# Patient Record
Sex: Female | Born: 1940
Health system: Southern US, Community
[De-identification: ages and names within clinical notes are randomized; demographics above are authoritative.]

## PROBLEM LIST (undated history)

## (undated) DIAGNOSIS — I10 Essential (primary) hypertension: Secondary | ICD-10-CM

## (undated) DIAGNOSIS — I739 Peripheral vascular disease, unspecified: Secondary | ICD-10-CM

## (undated) DIAGNOSIS — J45909 Unspecified asthma, uncomplicated: Secondary | ICD-10-CM

## (undated) DIAGNOSIS — E785 Hyperlipidemia, unspecified: Secondary | ICD-10-CM

## (undated) DIAGNOSIS — B019 Varicella without complication: Secondary | ICD-10-CM

## (undated) DIAGNOSIS — R0600 Dyspnea, unspecified: Secondary | ICD-10-CM

## (undated) DIAGNOSIS — M1990 Unspecified osteoarthritis, unspecified site: Secondary | ICD-10-CM

## (undated) HISTORY — DX: Varicella without complication: B01.9

## (undated) HISTORY — DX: Unspecified osteoarthritis, unspecified site: M19.90

## (undated) HISTORY — DX: Essential (primary) hypertension: I10

## (undated) HISTORY — DX: Hyperlipidemia, unspecified: E78.5

## (undated) HISTORY — DX: Unspecified asthma, uncomplicated: J45.909

## (undated) HISTORY — PX: BREAST EXCISIONAL BIOPSY: SUR124

---
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
## 1971-02-05 HISTORY — PX: FOOT SURGERY: SHX648

## 2005-09-17 LAB — HM COLONOSCOPY

## 2007-06-18 LAB — HM DEXA SCAN

## 2009-08-29 IMAGING — [facility] DIGITAL BILATERAL SCREENING WITH CAD
1 series · 4 of 4 positions shown · non-contrast
Comparison: none

REASON FOR EXAM: pain

[R CC · right · 4 of 4 slices shown]
[im 1/4]
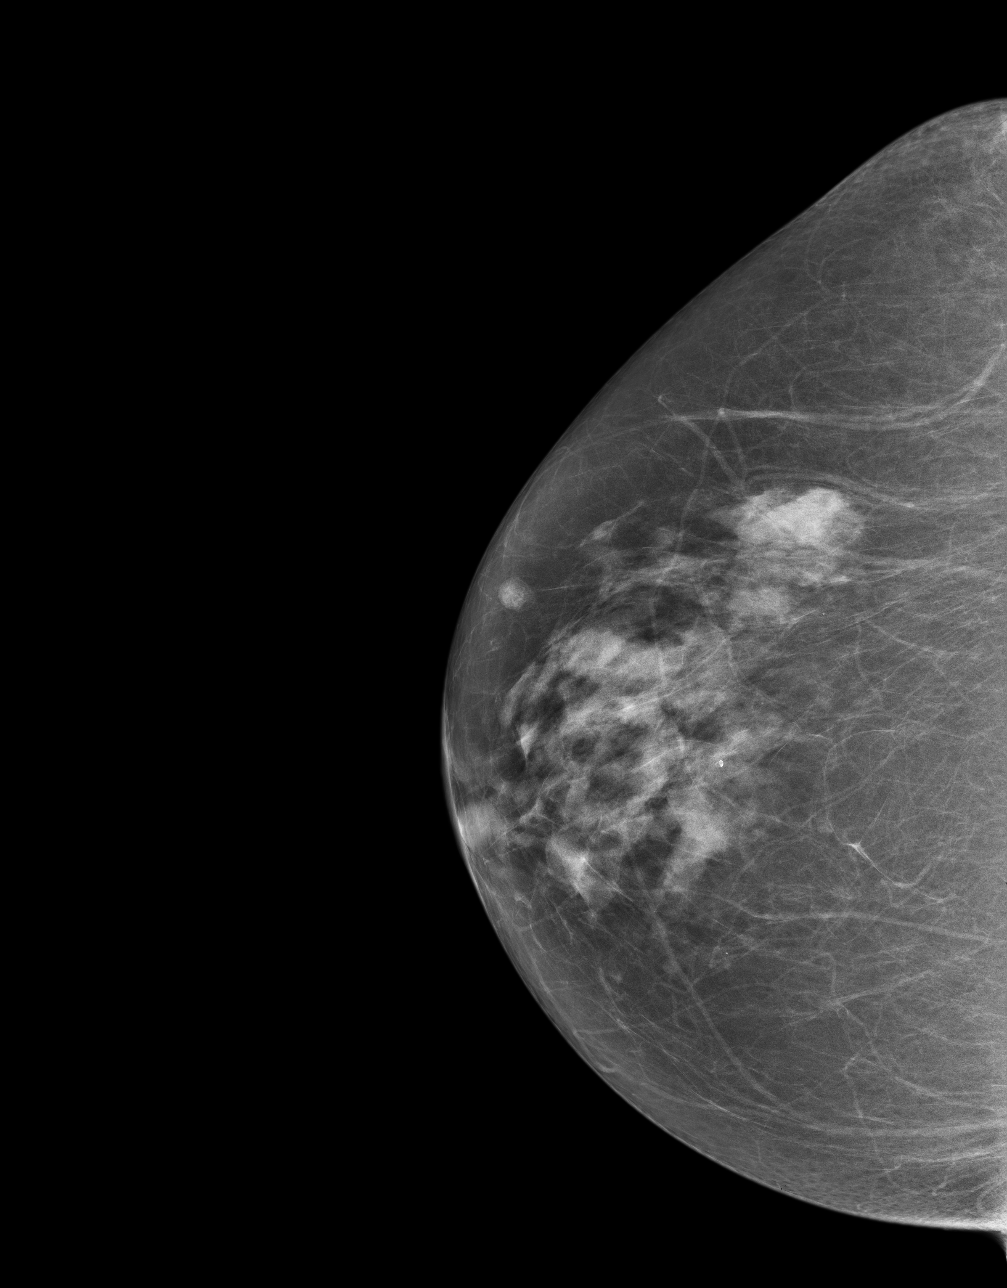
[im 2/4]
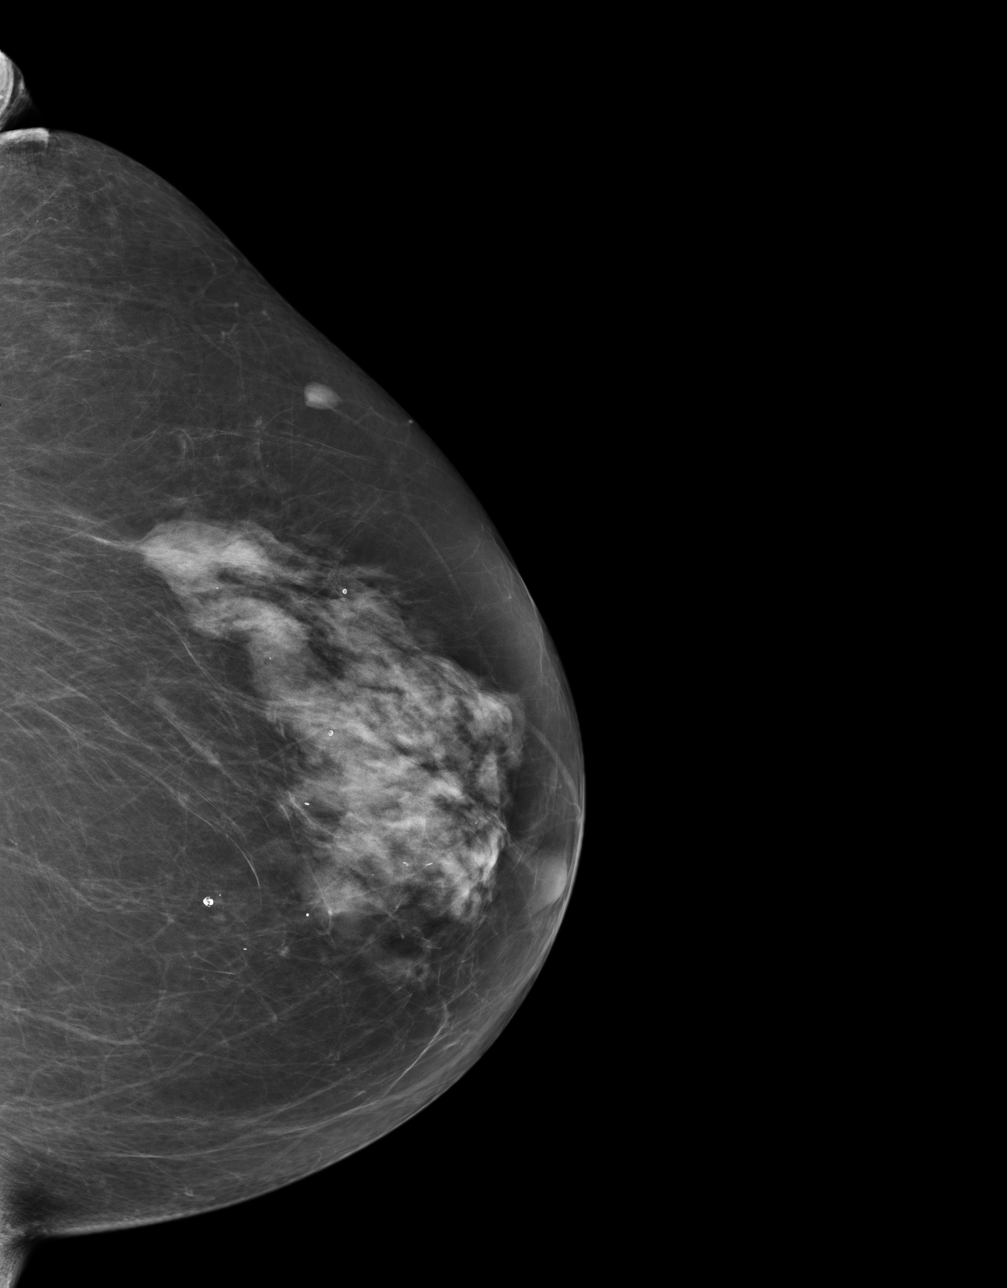
[im 3/4]
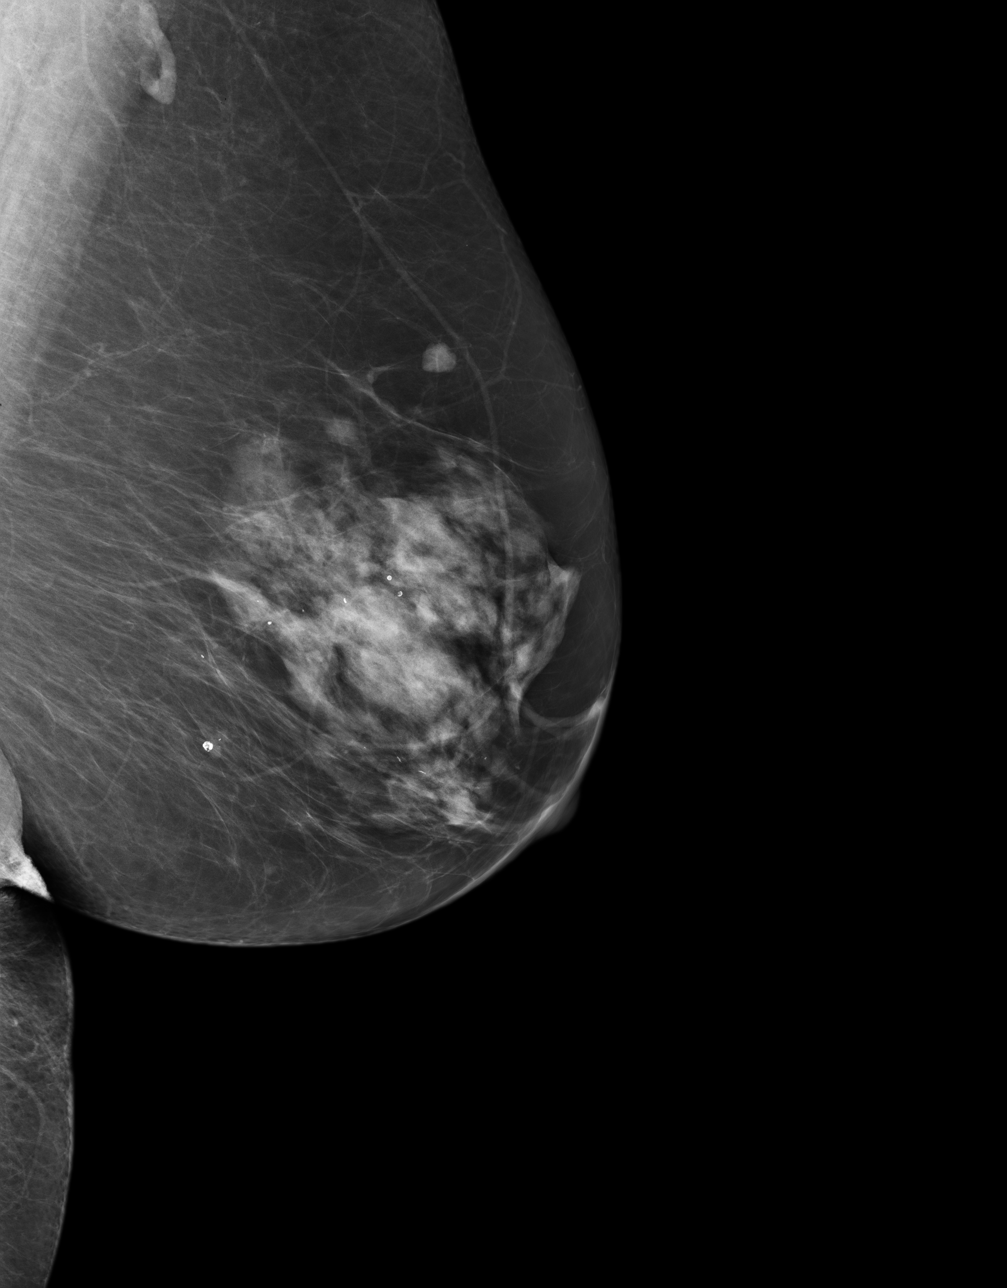
[im 4/4]
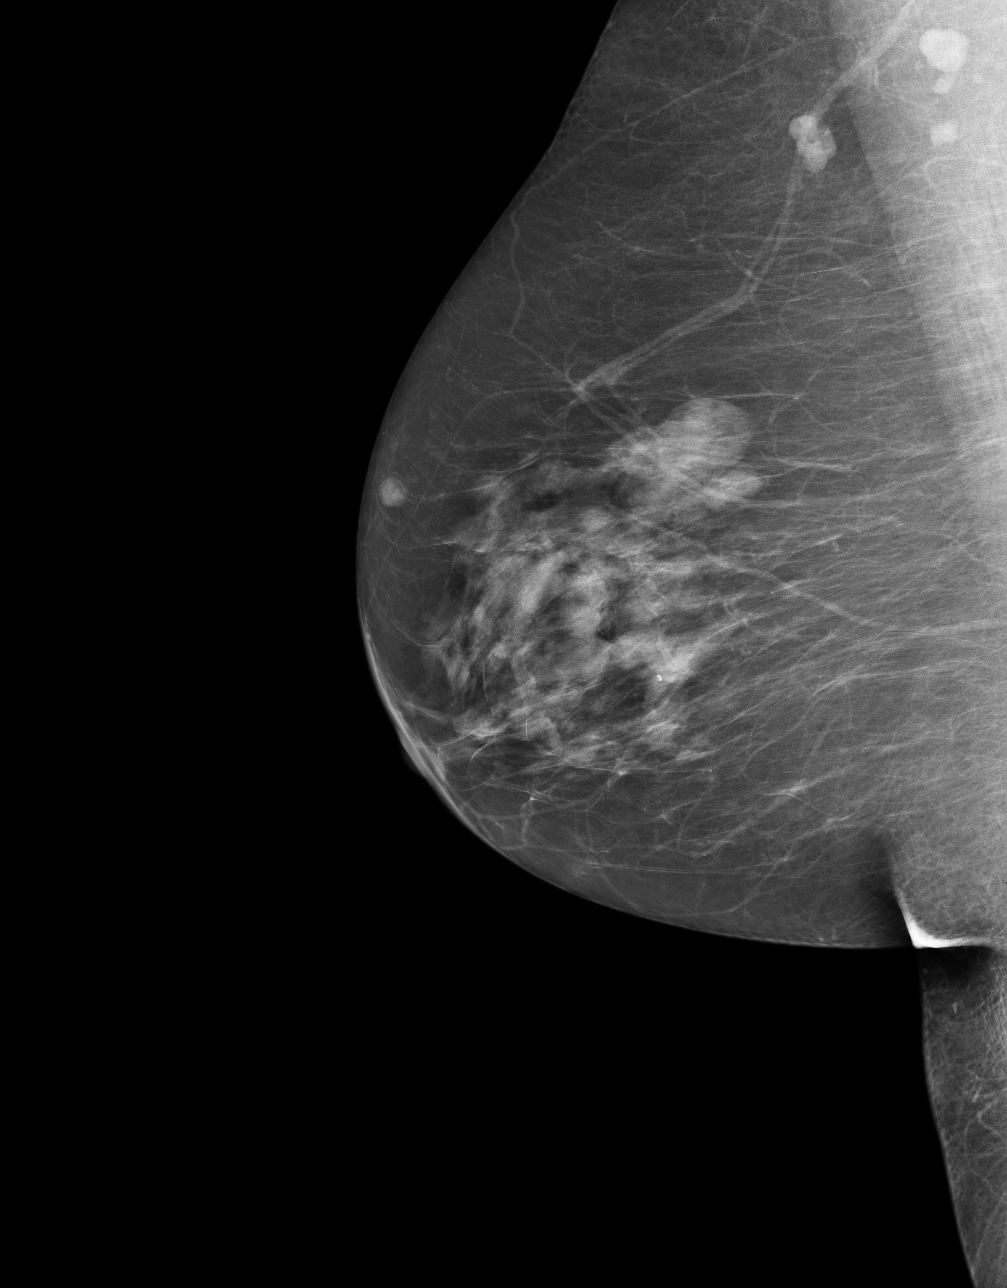

[4 of 4 positions shown; findings below may reference images not displayed]

Procedure: ABDOMINAL ULTRASOUND
The liver and spleen are normal in appearance. There is observed an echo
density in the gallbladder suspicious for a gallstone. No definite shadowing
is seen but the density does appear to move as the patient changes position.
 In the absence of shadowing, the confidence level in diagnosing gallstones
sonographically is diminished. An oral cholecystogram might be helpful for
further evaluation if such is clinically desired. No thickening of the
gallbladder wall is seen. The common bile duct measures 4.7 mm in diameter
which is within normal limits. The kidneys showed no hydronephrosis. There
is no ascites.
CONCLUSION: 1. There is a nonshadowing echo density in the gallbladder suspicious for a
nonshadowing stone. In the absence of shadowing, however, the confidence
level in diagnosing gallstones sonographically is diminished. In this
patient, further evaluation by oral cholecystogram might be helpful if such
is clinically indicated.

## 2010-10-10 IMAGING — [facility] DIGITAL BILATERAL SCREENING WITH CAD
1 series · 4 of 4 positions shown · non-contrast
Comparison: none

REASON FOR EXAM: Reason for Test: fluid build up;

[Series 7515: R CC · right · 4 of 4 slices shown]
[im 1/4]
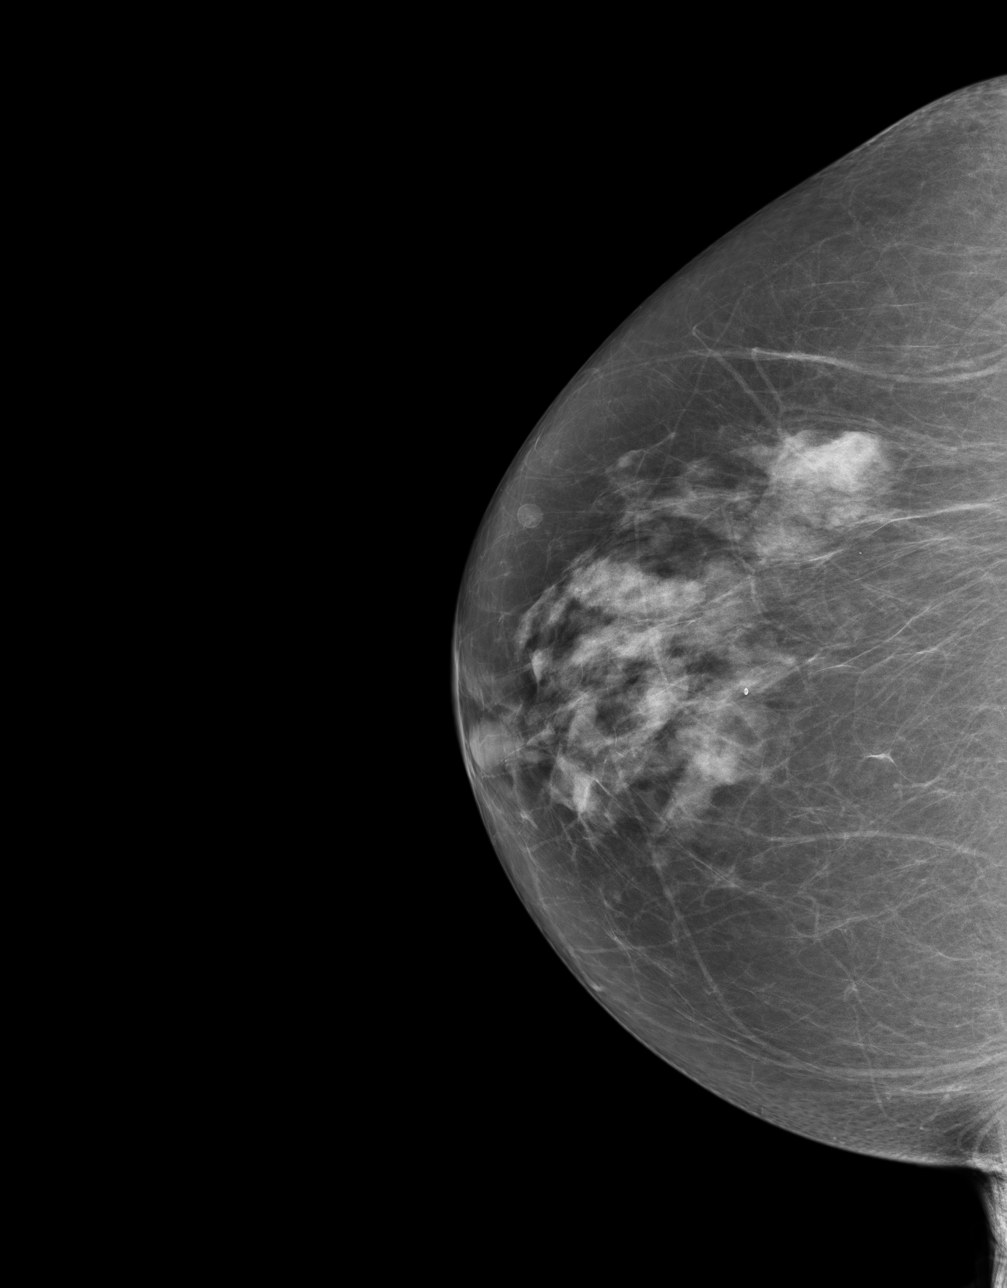
[im 2/4]
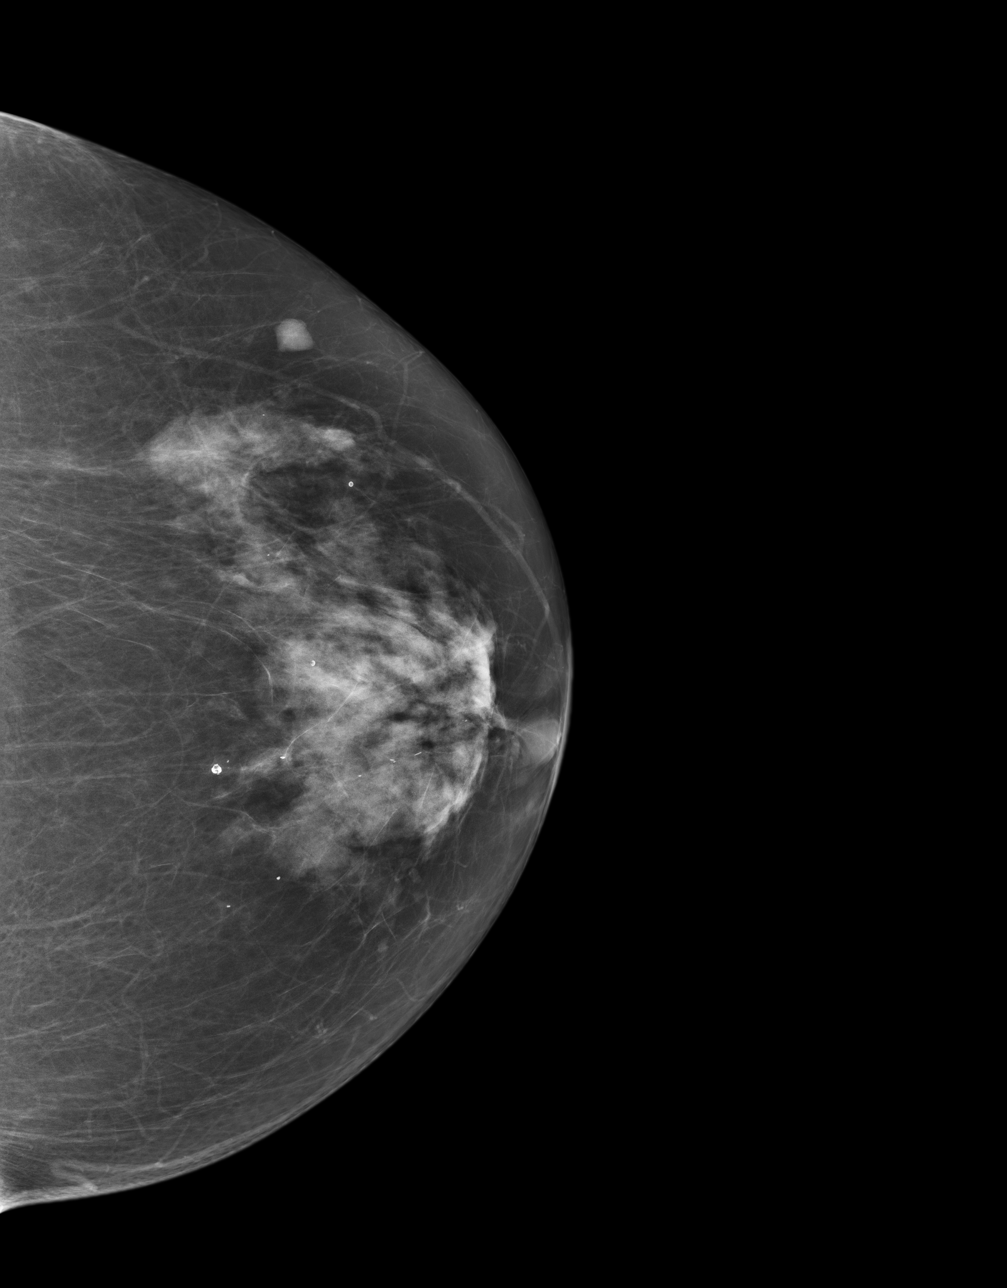
[im 3/4]
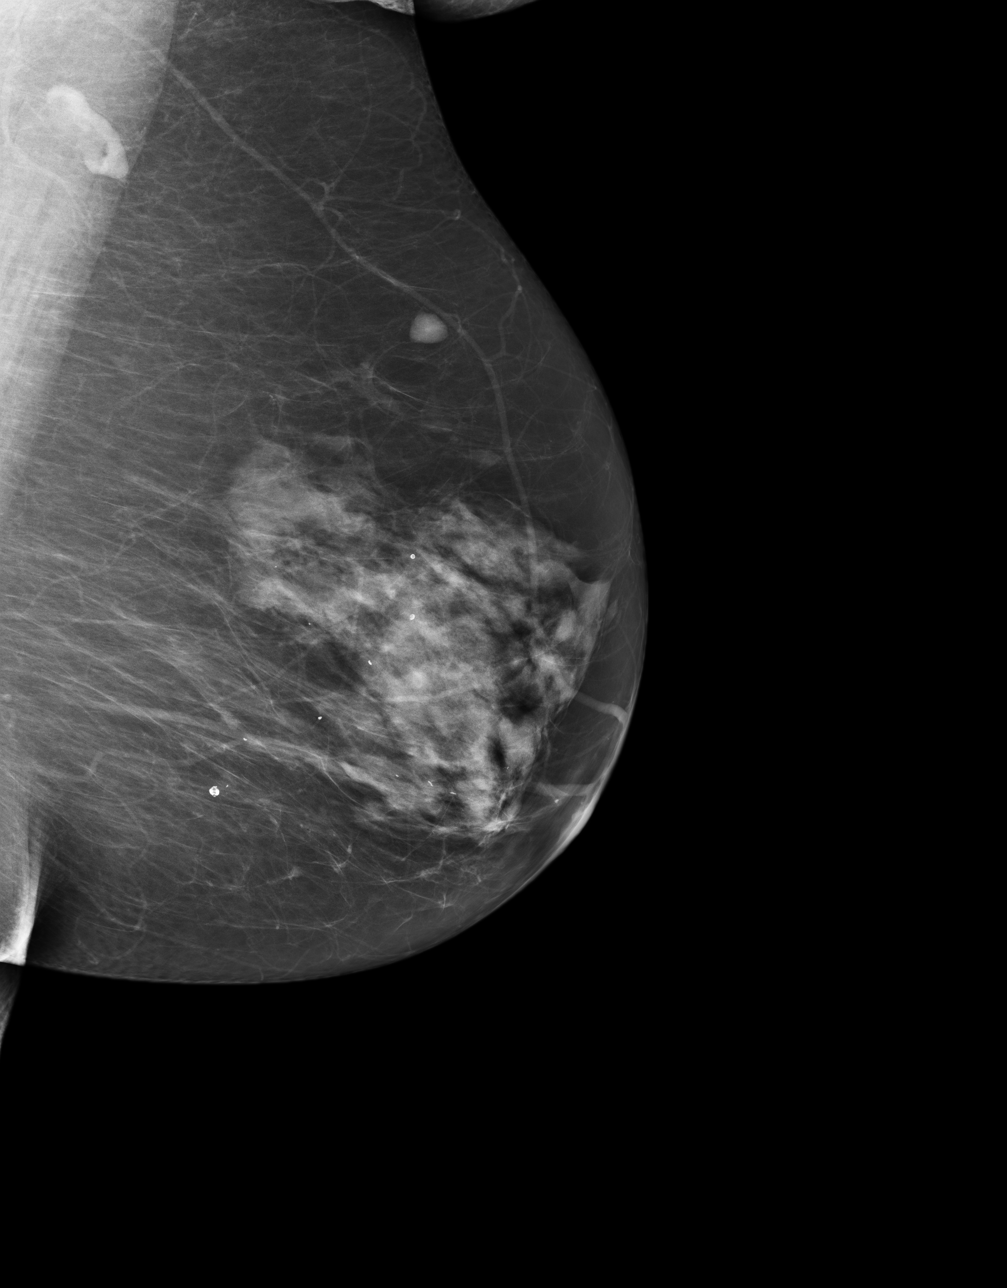
[im 4/4]
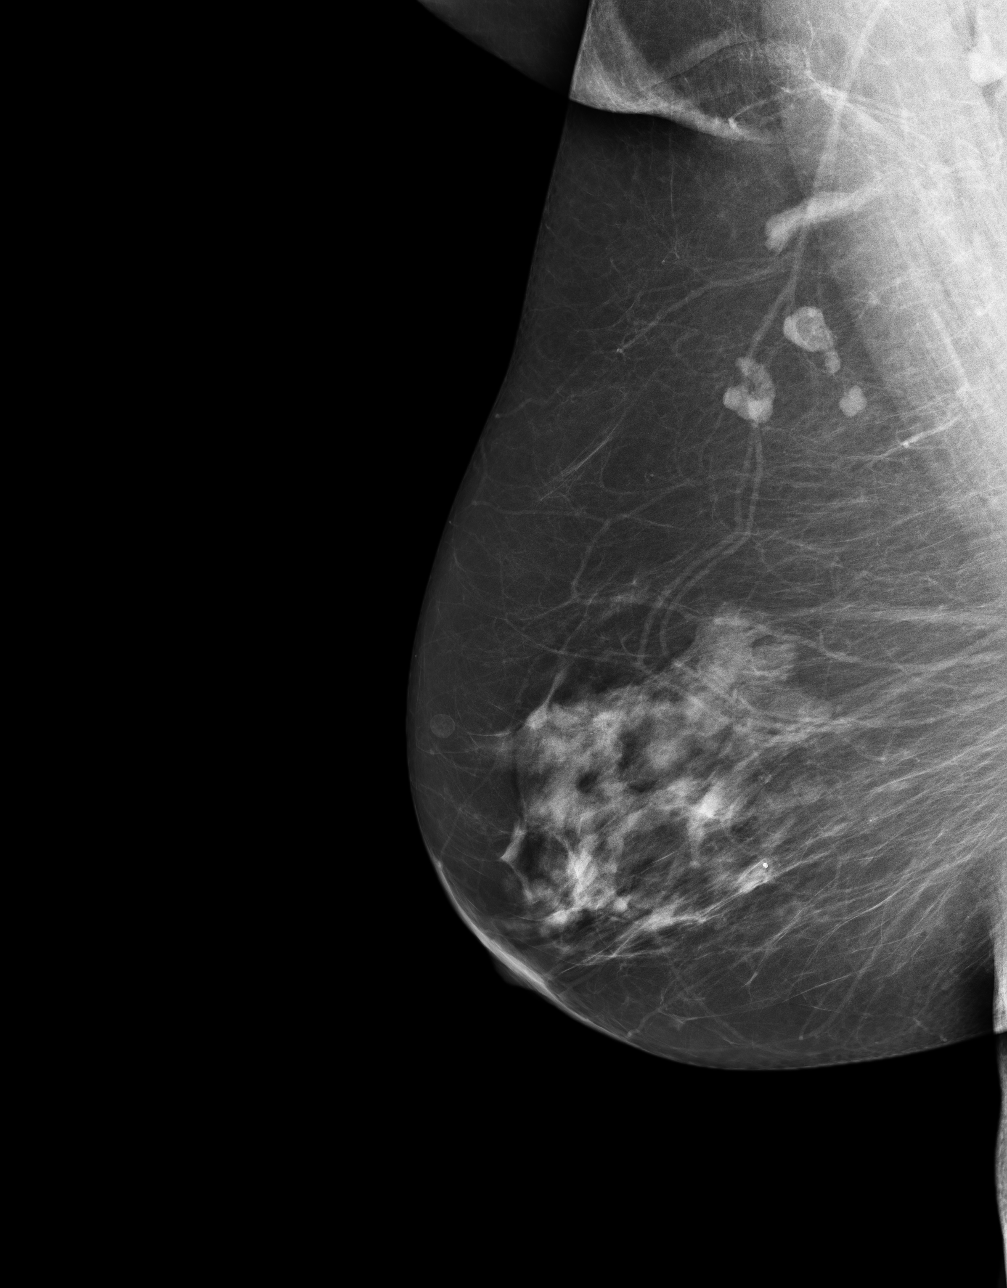

[4 of 4 positions shown; findings below may reference images not displayed]

Procedure: TWO VIEWS OF THE CHEST:
 Comparison is made to a prior study dated [DATE].
 There is increased AP diameter of the chest. Thickening of the interstitial
markings is also appreciated without evidence of overt edema. The cardiac
silhouette is unremarkable. No focal regions of consolidation are
demonstrated. There is a calcified plaque along the RIGHT hemidiaphragm. The
visualized bony skeleton demonstrates no evidence of fracture or
dislocation.
IMPRESSION: 1) Findings to suggest possible chronic obstructive pulmonary disease. The
thickened interstitial markings may represent the sequela of fibrosis.
 2) The calcified plaque along the RIGHT hemidiaphragm raises the suspicion
of prior asbestos exposure.

 <pp>

## 2011-11-05 IMAGING — [facility] DIGITAL BILATERAL SCREENING WITH CAD
1 series · 4 of 4 positions shown · non-contrast
Comparison: none

REASON FOR EXAM: abn cxr

[R CC · right · 4 of 4 slices shown]
[im 1/4]
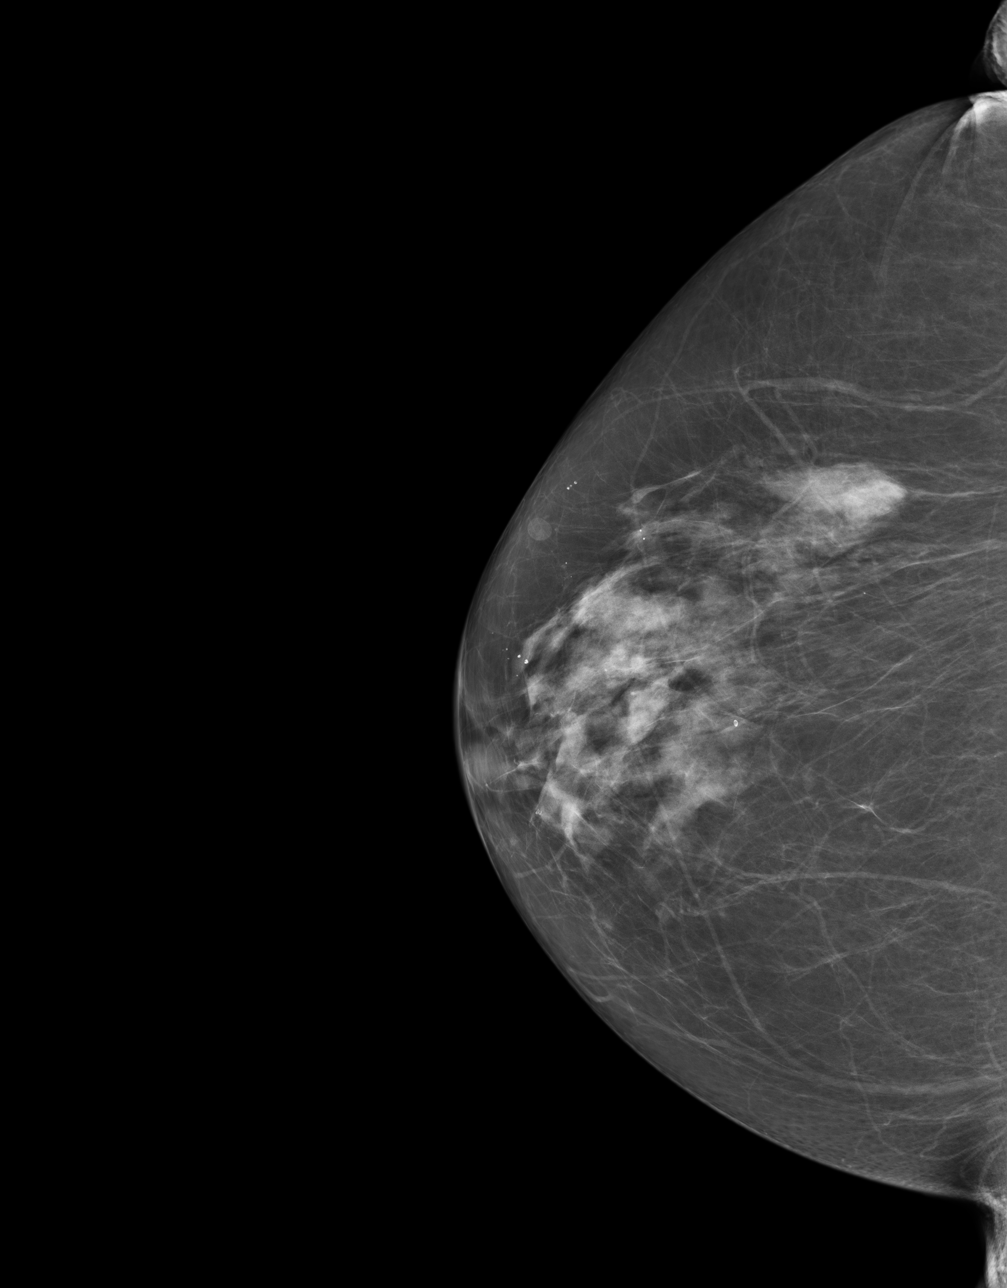
[im 2/4]
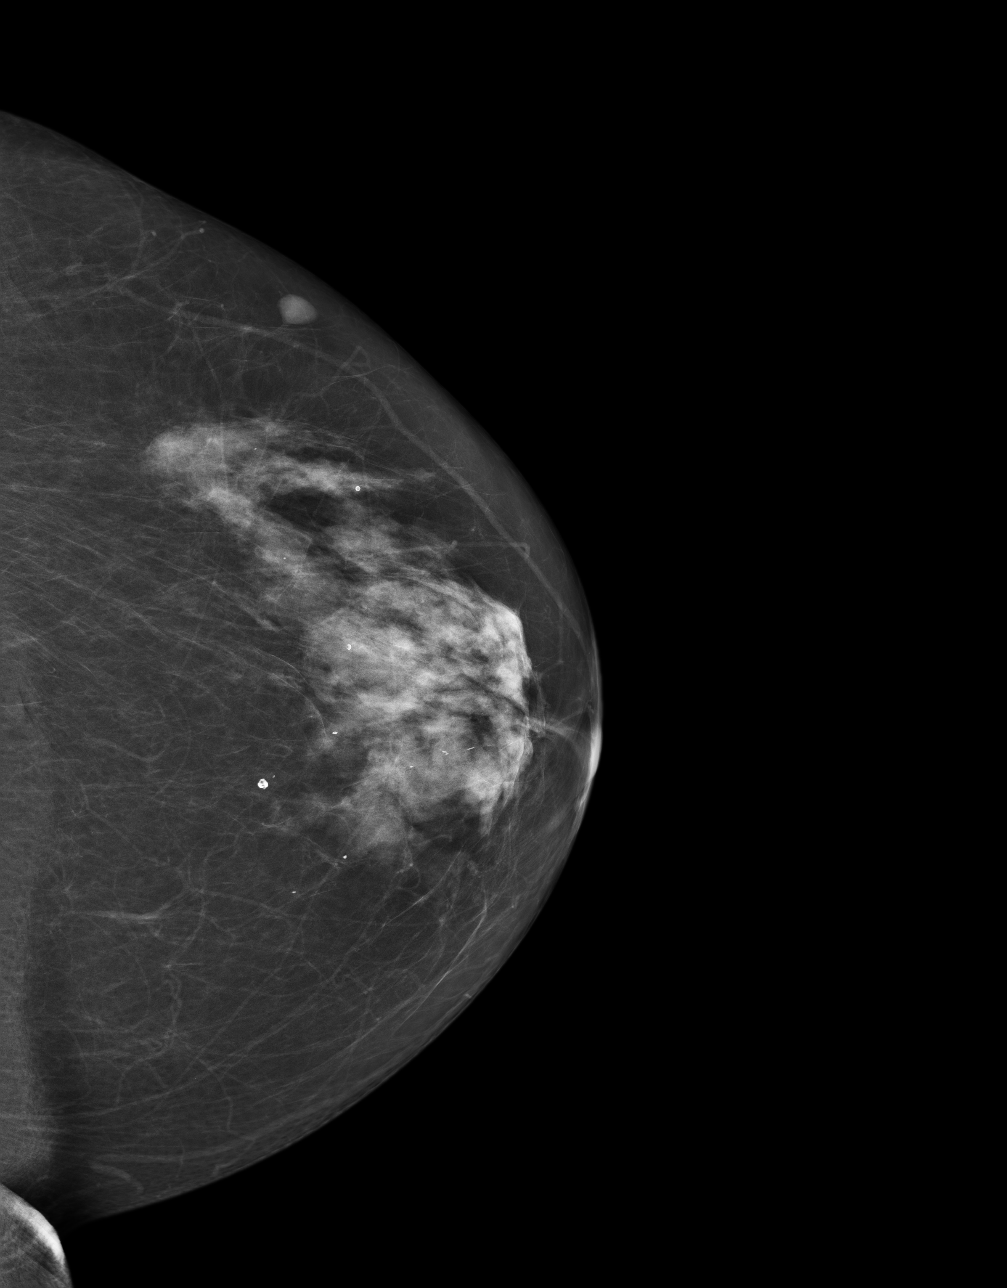
[im 3/4]
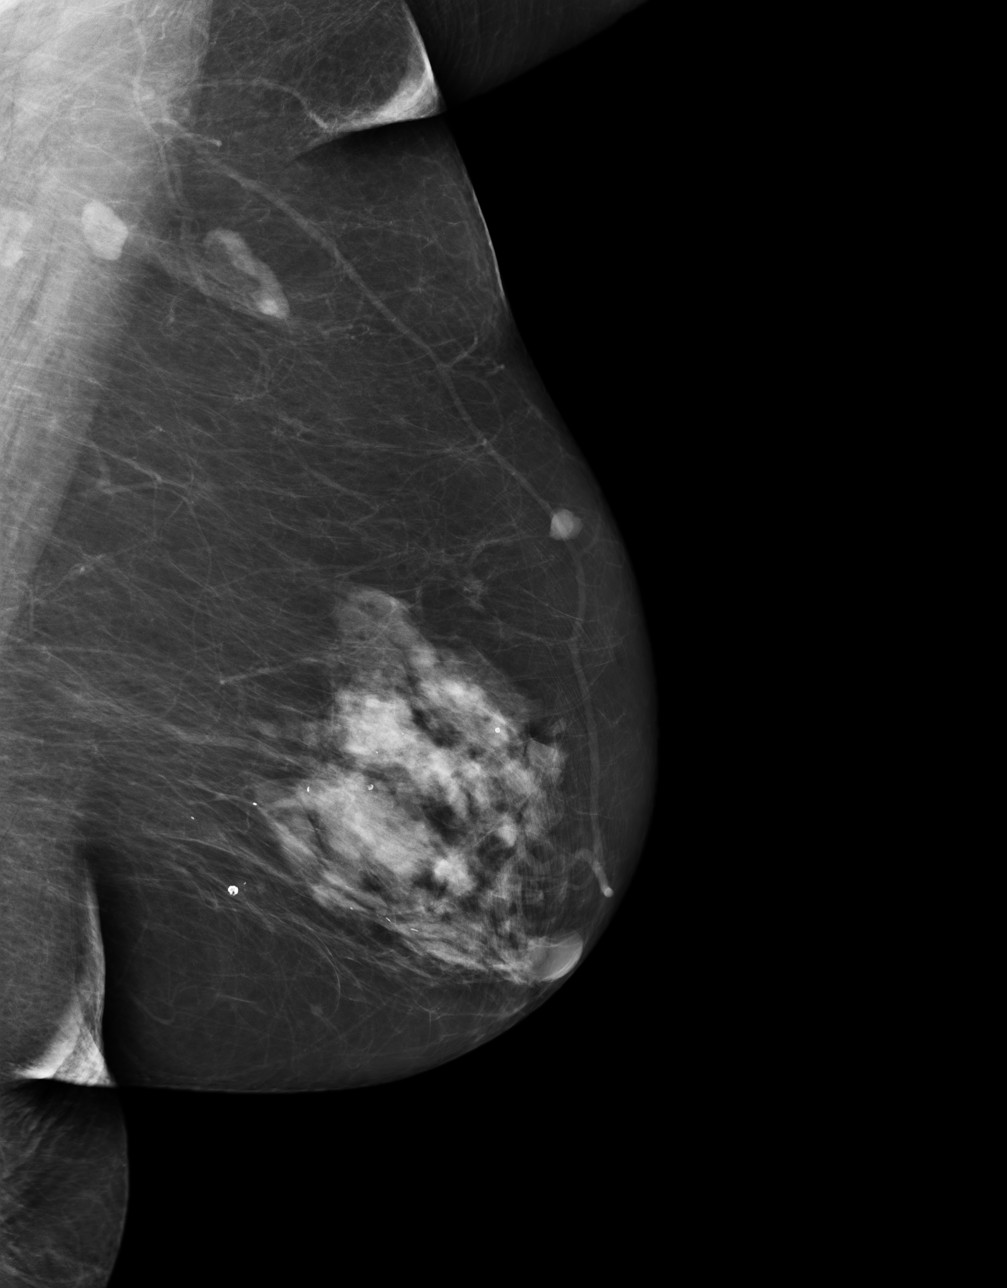
[im 4/4]
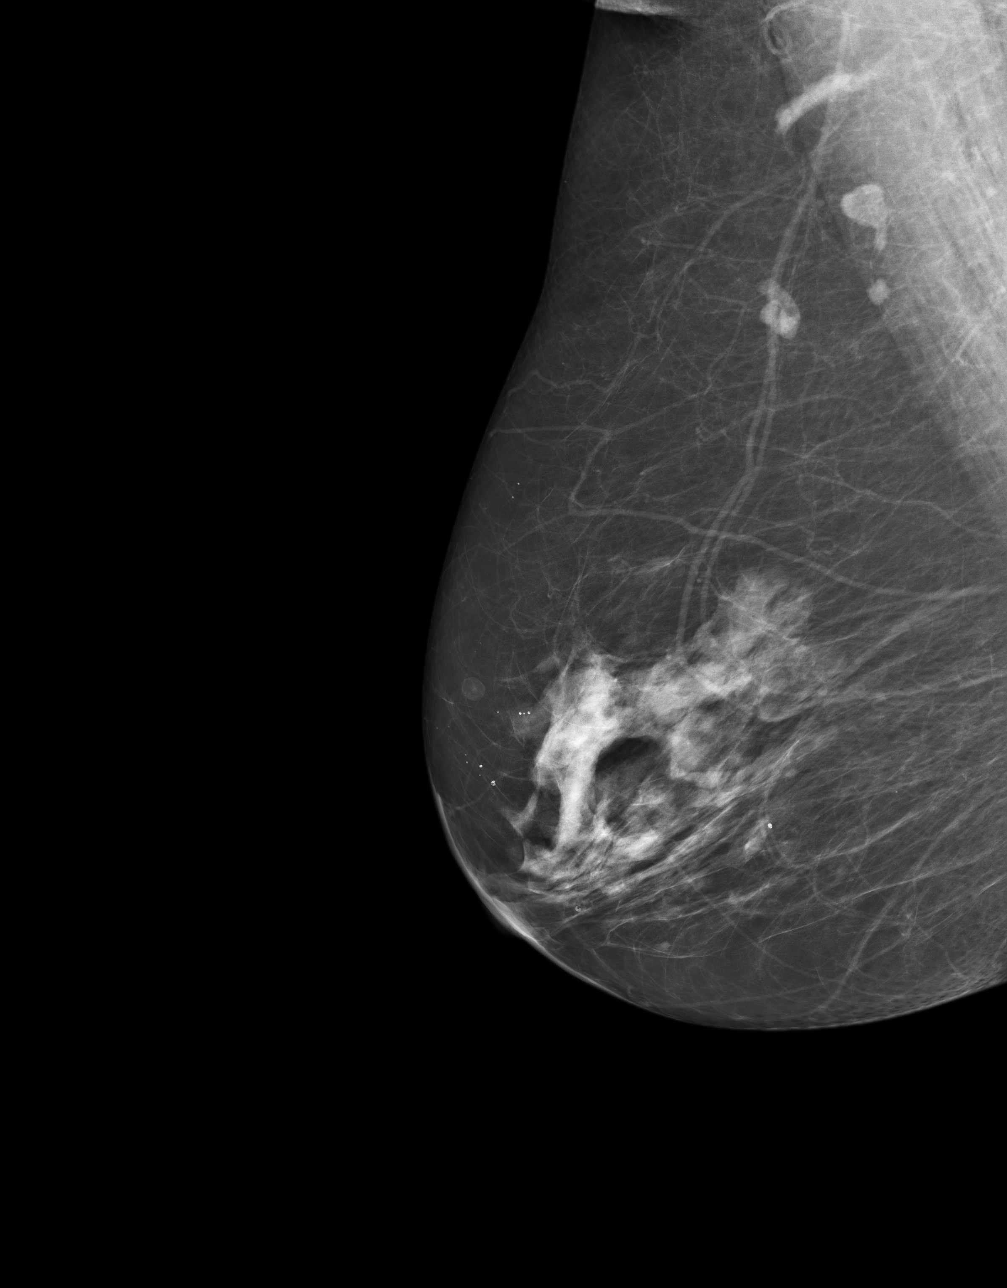

[4 of 4 positions shown; findings below may reference images not displayed]

FINDINGS: There is atelectasis in the RIGHT upper lobe with an area of
atelectatic lung appearing to be present along the superior mediastinum to
the RIGHT. There is evidence of volume loss with elevation of the major
fissure on the RIGHT compared to the LEFT. The lungs show slightly atypical
appearance. Does the patient have a history of chronic obstructive disease
such as asthma? The changes do not appear to represent cystic fibrosis.
There is a little bit of atelectasis or fibrosis at the lung base on the
LEFT. There is no adenopathy evident. No mediastinal mass is demonstrated.
The heart appears normal. There is no pleural effusion. The upper abdominal
viscera appear to be grossly normal.
IMPRESSION: 1) Atelectasis of the RIGHT upper lobe. Correlation for history of
underlying lung disease would be recommended.

## 2012-02-05 HISTORY — PX: COLONOSCOPY: SHX174

## 2013-03-03 DIAGNOSIS — R030 Elevated blood-pressure reading, without diagnosis of hypertension: Secondary | ICD-10-CM | POA: Diagnosis not present

## 2013-03-03 DIAGNOSIS — H9209 Otalgia, unspecified ear: Secondary | ICD-10-CM | POA: Diagnosis not present

## 2013-03-03 DIAGNOSIS — H6120 Impacted cerumen, unspecified ear: Secondary | ICD-10-CM | POA: Diagnosis not present

## 2013-03-10 DIAGNOSIS — Z5181 Encounter for therapeutic drug level monitoring: Secondary | ICD-10-CM | POA: Diagnosis not present

## 2013-03-10 DIAGNOSIS — I10 Essential (primary) hypertension: Secondary | ICD-10-CM | POA: Diagnosis not present

## 2013-04-14 DIAGNOSIS — Z5181 Encounter for therapeutic drug level monitoring: Secondary | ICD-10-CM | POA: Diagnosis not present

## 2013-04-14 DIAGNOSIS — I10 Essential (primary) hypertension: Secondary | ICD-10-CM | POA: Diagnosis not present

## 2013-04-14 DIAGNOSIS — E871 Hypo-osmolality and hyponatremia: Secondary | ICD-10-CM | POA: Diagnosis not present

## 2013-11-11 DIAGNOSIS — Z23 Encounter for immunization: Secondary | ICD-10-CM | POA: Diagnosis not present

## 2013-11-17 DIAGNOSIS — H25813 Combined forms of age-related cataract, bilateral: Secondary | ICD-10-CM | POA: Diagnosis not present

## 2014-04-18 ENCOUNTER — Ambulatory Visit (INDEPENDENT_AMBULATORY_CARE_PROVIDER_SITE_OTHER): Payer: Medicare Other

## 2014-04-18 ENCOUNTER — Encounter: Payer: Self-pay | Admitting: Podiatry

## 2014-04-18 ENCOUNTER — Ambulatory Visit (INDEPENDENT_AMBULATORY_CARE_PROVIDER_SITE_OTHER): Payer: Medicare Other | Admitting: Podiatry

## 2014-04-18 VITALS — BP 119/62 | HR 67 | Resp 16

## 2014-04-18 DIAGNOSIS — L84 Corns and callosities: Secondary | ICD-10-CM | POA: Diagnosis not present

## 2014-04-18 DIAGNOSIS — M779 Enthesopathy, unspecified: Secondary | ICD-10-CM

## 2014-04-18 DIAGNOSIS — M898X9 Other specified disorders of bone, unspecified site: Secondary | ICD-10-CM

## 2014-04-18 DIAGNOSIS — M7752 Other enthesopathy of left foot: Secondary | ICD-10-CM

## 2014-04-18 DIAGNOSIS — M778 Other enthesopathies, not elsewhere classified: Secondary | ICD-10-CM

## 2014-04-18 NOTE — Progress Notes (Signed)
   Subjective:    Patient ID: Lacey Brown, female    DOB: [birth year], 74 y.o.   MRN: [id number]  HPI Comments: "I have a corn"  Patient c/o aching 5th toe left interdigitally for several years. The area is callused. Its only bothersome through cold months when she has to wear enclosed shoes.   Toe Pain       Review of Systems  All other systems reviewed and are negative.      Objective:   Physical Exam: I have reviewed her past medical history medications allergies surgery social history and review of systems. Pulses are strongly palpable bilateral. Neurologic sensorium is intact as was the monofilament. Deep tendon reflexes are intact bilateral. She has pain on palpation fifth metatarsophalangeal joint of the left foot she has a soft corn between fourth and fifth digits of the left foot.        Assessment & Plan:  Assessment: Heloma molle left foot with porokeratotic lesion and capsulitis.  Plan: Injected fifth metatarsophalangeal joint periarticular today with dexamethasone and local anesthetic. Also debrided reactive hyperkeratotic lesion from between the toes. I will follow up with her on an as-needed basis.

## 2014-06-08 DIAGNOSIS — I10 Essential (primary) hypertension: Secondary | ICD-10-CM | POA: Diagnosis not present

## 2014-06-08 DIAGNOSIS — Z0000 Encounter for general adult medical examination without abnormal findings: Secondary | ICD-10-CM | POA: Diagnosis not present

## 2014-06-08 DIAGNOSIS — Z23 Encounter for immunization: Secondary | ICD-10-CM | POA: Diagnosis not present

## 2014-06-08 DIAGNOSIS — J449 Chronic obstructive pulmonary disease, unspecified: Secondary | ICD-10-CM | POA: Diagnosis not present

## 2014-06-08 DIAGNOSIS — Z5181 Encounter for therapeutic drug level monitoring: Secondary | ICD-10-CM | POA: Diagnosis not present

## 2014-06-08 DIAGNOSIS — Z1231 Encounter for screening mammogram for malignant neoplasm of breast: Secondary | ICD-10-CM | POA: Diagnosis not present

## 2014-06-08 LAB — BASIC METABOLIC PANEL
Creatinine: 0.8 mg/dL (ref <=1.1)
Potassium: 4.1 mmol/L (ref 3.4–5.3)

## 2014-06-08 LAB — CBC AND DIFFERENTIAL
HEMATOCRIT: 36 % (ref 36–46)
HEMOGLOBIN: 12 g/dL (ref 12.0–16.0)

## 2014-06-30 DIAGNOSIS — Z1231 Encounter for screening mammogram for malignant neoplasm of breast: Secondary | ICD-10-CM | POA: Diagnosis not present

## 2014-06-30 LAB — HM MAMMOGRAPHY: HM MAMMO: NORMAL

## 2014-08-18 DIAGNOSIS — Z5181 Encounter for therapeutic drug level monitoring: Secondary | ICD-10-CM | POA: Diagnosis not present

## 2014-08-18 DIAGNOSIS — Z0130 Encounter for examination of blood pressure without abnormal findings: Secondary | ICD-10-CM | POA: Diagnosis not present

## 2014-08-18 DIAGNOSIS — E871 Hypo-osmolality and hyponatremia: Secondary | ICD-10-CM | POA: Diagnosis not present

## 2014-09-28 DIAGNOSIS — M19011 Primary osteoarthritis, right shoulder: Secondary | ICD-10-CM | POA: Diagnosis not present

## 2014-10-03 ENCOUNTER — Ambulatory Visit (INDEPENDENT_AMBULATORY_CARE_PROVIDER_SITE_OTHER): Payer: Medicare Other

## 2014-10-03 ENCOUNTER — Encounter: Payer: Self-pay | Admitting: Podiatry

## 2014-10-03 ENCOUNTER — Ambulatory Visit (INDEPENDENT_AMBULATORY_CARE_PROVIDER_SITE_OTHER): Payer: Medicare Other | Admitting: Podiatry

## 2014-10-03 VITALS — BP 149/84 | HR 69 | Resp 12

## 2014-10-03 DIAGNOSIS — M779 Enthesopathy, unspecified: Secondary | ICD-10-CM | POA: Diagnosis not present

## 2014-10-03 NOTE — Progress Notes (Signed)
   Subjective:    Patient ID: Lacey Brown, female    DOB: 10/30/1940, 74 y.o.   MRN: [id number]  HPI: She presents today with several week duration of pain to the second metatarsophalangeal joint of the right foot. She denies any trauma but states that it hurts to walk on his stand on it for long periods of time. She has tried different shoes with all to no avail.    Review of Systems  Cardiovascular: Positive for leg swelling.  Hematological: Bruises/bleeds easily.       Objective:   Physical Exam: I have reviewed her past history medications allergies surgeries and social history. 74 year old white female vital signs stable alert and oriented 3 no acute distress. Pulses are strongly palpable bilateral. Neurologic sensorium is intact per Semmes-Weinstein monofilament. The tendon reflexes are intact bilateral and muscle strength +5 over 5 dorsiflexion plantar flexors and inverters everters all intrinsic musculature is intact. Orthopedic evaluation of his result joints distal to the ankle for range of motion without crepitation. She has pain on end range of motion of the second metatarsophalangeal joint of the right foot. Radiographs confirm elongated second metatarsal with hallux valgus deformity right. This is consistent with capsulitis. Cutaneous evaluation and straight supple well-hydrated cutis no erythema edema cellulitis drainage or odor no open wounds.        Assessment & Plan:  Assessment: Capsulitis second metatarsophalangeal joint of the right foot.  Plan: Discussed etiology pathology conservative versus surgical therapies. After sterile Betadine skin prep injected the second metatarsophalangeal joint with local and aesthetic. I will follow-up with her in 1 month.  Roselind Messier DPM

## 2014-10-24 ENCOUNTER — Encounter: Payer: Self-pay | Admitting: Internal Medicine

## 2014-10-24 ENCOUNTER — Ambulatory Visit (INDEPENDENT_AMBULATORY_CARE_PROVIDER_SITE_OTHER): Payer: Medicare Other | Admitting: Internal Medicine

## 2014-10-24 VITALS — BP 148/82 | HR 58 | Temp 98.3°F | Ht 59.5 in | Wt 170.0 lb

## 2014-10-24 DIAGNOSIS — M1990 Unspecified osteoarthritis, unspecified site: Secondary | ICD-10-CM | POA: Diagnosis not present

## 2014-10-24 DIAGNOSIS — E669 Obesity, unspecified: Secondary | ICD-10-CM

## 2014-10-24 DIAGNOSIS — J45909 Unspecified asthma, uncomplicated: Secondary | ICD-10-CM | POA: Insufficient documentation

## 2014-10-24 DIAGNOSIS — J449 Chronic obstructive pulmonary disease, unspecified: Secondary | ICD-10-CM | POA: Insufficient documentation

## 2014-10-24 DIAGNOSIS — E785 Hyperlipidemia, unspecified: Secondary | ICD-10-CM

## 2014-10-24 DIAGNOSIS — K219 Gastro-esophageal reflux disease without esophagitis: Secondary | ICD-10-CM

## 2014-10-24 DIAGNOSIS — I10 Essential (primary) hypertension: Secondary | ICD-10-CM

## 2014-10-24 DIAGNOSIS — J4530 Mild persistent asthma, uncomplicated: Secondary | ICD-10-CM | POA: Diagnosis not present

## 2014-10-24 DIAGNOSIS — E66811 Obesity, class 1: Secondary | ICD-10-CM

## 2014-10-24 NOTE — Progress Notes (Signed)
HPI  Pt presents to the clinic today to establish care and for management of the conditions listed below. She is transferring care from Dr. Anastasia Pall in [place].    Flu: 11/2013 Tetanus: unsure of her last one Pneumovax: unsure of her last one Prevnar: unsure of her last one Zostovax: 2011 Pap Smear: Had it at the [place], unsure of the date Mammogram: 08/2014- [facility] Screening: yearly Dentist: as needed  HTN: She takes Losartan and Metoprolol daily as prescribed. Her BP today is 148/82. She denies chest pain, chest tightness or shortness of breath.  HLD: She takes Fish Oil daily. She has never taking any cholesterol lowering medication. She tries to consume a low fat diet.  Arthritis: Mainly in her back. She takes Ibuprofen as needed.  Asthma: She uses Advair only when she feels like she needs it. She no longer smokes.  Past Medical History  Diagnosis Date  . Hypertension   . Hyperlipidemia   . Arthritis   . Asthma   . Chicken pox     Current Outpatient Prescriptions  Medication Sig Dispense Refill  . Cholecalciferol (VITAMIN D3) 2000 UNITS TABS Take 1 tablet by mouth daily.    . DHA-EPA-VITAMIN E PO Take by mouth.    . Fluticasone-Salmeterol (ADVAIR DISKUS) 250-50 MCG/DOSE AEPB Inhale into the lungs.    Marland Kitchen ibuprofen (ADVIL,MOTRIN) 400 MG tablet Take 400 mg by mouth daily as needed.     Marland Kitchen LOSARTAN POTASSIUM PO Take 1 tablet by mouth daily.     Marland Kitchen METOPROLOL TARTRATE PO Take 1 tablet by mouth daily.     . Multiple Minerals-Vitamins (CALCIUM CITRATE PLUS PO) Take 1 tablet by mouth daily.    . Omega-3 Fatty Acids (FISH OIL) 1000 MG CAPS Take 1 capsule by mouth daily.    . vitamin C (ASCORBIC ACID) 500 MG tablet Take 1,000 mg by mouth daily.      No current facility-administered medications for this visit.    Allergies  Allergen Reactions  . Ace Inhibitors     Other  reaction(s): Cough  . Codeine Nausea Only    Family History  Problem Relation Age of Onset  . Heart disease Mother   . Heart disease Father   . Arthritis Maternal Grandmother     Social History   Social History  . Marital Status: Widowed    Spouse Name: N/A  . Number of Children: N/A  . Years of Education: N/A   Occupational History  . Not on file.   Social History Main Topics  . Smoking status: Former Smoker    Quit date: 04/17/2009  . Smokeless tobacco: Never Used  . Alcohol Use: 0.0 oz/week    0 Standard drinks or equivalent per week     Comment: occasional  . Drug Use: No  . Sexual Activity: Not on file   Other Topics Concern  . Not on file   Social History Narrative    ROS:  Constitutional: Denies fever, malaise, fatigue, headache or abrupt weight changes.  HEENT: Denies eye pain, eye redness, ear pain, ringing in the ears, wax buildup, runny nose, nasal congestion, bloody nose, or sore throat. Respiratory: Denies difficulty breathing, shortness of breath, cough or sputum production.   Cardiovascular: Denies chest pain, chest tightness, palpitations or swelling in the hands or feet.  Gastrointestinal: Pt reports reflux. Denies abdominal pain, bloating, constipation, diarrhea or blood in the stool.  GU: Denies frequency, urgency,  pain with urination, blood in urine, odor or discharge. Musculoskeletal: Pt reports low back pain. Denies decrease in range of motion, difficulty with gait, muscle pain or joint swelling.  Skin: Denies redness, rashes, lesions or ulcercations.  Neurological: Denies dizziness, difficulty with memory, difficulty with speech or problems with balance and coordination.  Psych: Denies anxiety, depression, SI/HI.  No other specific complaints in a complete review of systems (except as listed in HPI above).  PE:  BP 148/82 mmHg  Pulse 58  Temp(Src) 98.3 F (36.8 C) (Oral)  Ht 4' 11.5" (1.511 m)  Wt 170 lb (77.111 kg)  BMI 33.77 kg/m2   SpO2 98%  Wt Readings from Last 3 Encounters:  10/24/14 170 lb (77.111 kg)    General: Appears her stated age, obese in NAD. HEENT: Head: normal shape and size; Eyes: sclera white, no icterus, conjunctiva pink, PERRLA and EOMs intact;  Cardiovascular: Normal rate and rhythm. S1,S2 noted.  No murmur, rubs or gallops noted. No JVD or BLE edema. No carotid bruits noted. Pulmonary/Chest: Normal effort and positive vesicular breath sounds. No respiratory distress. No wheezes, rales or ronchi noted.  Abdomen: Soft and nontender. Normal bowel sounds, no bruits noted. No distention or masses noted. Liver, spleen and kidneys non palpable. Musculoskeletal: Normal flexion, extension and rotation of the spine. Pain with palpation over the lumbar spine. No difficulty with gait.  Neurological: Alert and oriented.  Psychiatric: Mood and affect normal. Behavior is normal. Judgment and thought content normal.    Assessment and Plan:

## 2014-10-24 NOTE — Assessment & Plan Note (Signed)
Encouraged her to work on diet and exercise 

## 2014-10-24 NOTE — Assessment & Plan Note (Signed)
She will continue Ibuprofen as needed

## 2014-10-24 NOTE — Assessment & Plan Note (Signed)
Discussed avoiding foods that trigger her reflux Discussed how weight loss can improve her reflux

## 2014-10-24 NOTE — Assessment & Plan Note (Signed)
BP controlled (given age) on current medications Will request recent labs and ECG from previous PCP

## 2014-10-24 NOTE — Assessment & Plan Note (Signed)
Advised her to take Advair daily, it is not really used as a prn med Will see if PCP has PFT's

## 2014-10-24 NOTE — Assessment & Plan Note (Signed)
Encouraged her to consume a low fat diet She will continue Fish Oil Daily Will get recent labs from PCP

## 2014-10-24 NOTE — Progress Notes (Signed)
Pre visit review using our clinic review tool, if applicable. No additional management support is needed unless otherwise documented below in the visit note. 

## 2014-10-24 NOTE — Patient Instructions (Signed)
Fat and Cholesterol Control Diet Fat and cholesterol levels in your blood and organs are influenced by your diet. High levels of fat and cholesterol may lead to diseases of the heart, small and large blood vessels, gallbladder, liver, and pancreas. CONTROLLING FAT AND CHOLESTEROL WITH DIET Although exercise and lifestyle factors are important, your diet is key. That is because certain foods are known to raise cholesterol and others to lower it. The goal is to balance foods for their effect on cholesterol and more importantly, to replace saturated and trans fat with other types of fat, such as monounsaturated fat, polyunsaturated fat, and omega-3 fatty acids. On average, a person should consume no more than 15 to 17 g of saturated fat daily. Saturated and trans fats are considered "bad" fats, and they will raise LDL cholesterol. Saturated fats are primarily found in animal products such as meats, butter, and cream. However, that does not mean you need to give up all your favorite foods. Today, there are good tasting, low-fat, low-cholesterol substitutes for most of the things you like to eat. Choose low-fat or nonfat alternatives. Choose round or loin cuts of red meat. These types of cuts are lowest in fat and cholesterol. Chicken (without the skin), fish, veal, and ground turkey breast are great choices. Eliminate fatty meats, such as hot dogs and salami. Even shellfish have little or no saturated fat. Have a 3 oz (85 g) portion when you eat lean meat, poultry, or fish. Trans fats are also called "partially hydrogenated oils." They are oils that have been scientifically manipulated so that they are solid at room temperature resulting in a longer shelf life and improved taste and texture of foods in which they are added. Trans fats are found in stick margarine, some tub margarines, cookies, crackers, and baked goods.  When baking and cooking, oils are a great substitute for butter. The monounsaturated oils are  especially beneficial since it is believed they lower LDL and raise HDL. The oils you should avoid entirely are saturated tropical oils, such as coconut and palm.  Remember to eat a lot from food groups that are naturally free of saturated and trans fat, including fish, fruit, vegetables, beans, grains (barley, rice, couscous, bulgur wheat), and pasta (without cream sauces).  IDENTIFYING FOODS THAT LOWER FAT AND CHOLESTEROL  Soluble fiber may lower your cholesterol. This type of fiber is found in fruits such as apples, vegetables such as broccoli, potatoes, and carrots, legumes such as beans, peas, and lentils, and grains such as barley. Foods fortified with plant sterols (phytosterol) may also lower cholesterol. You should eat at least 2 g per day of these foods for a cholesterol lowering effect.  Read package labels to identify low-saturated fats, trans fat free, and low-fat foods at the supermarket. Select cheeses that have only 2 to 3 g saturated fat per ounce. Use a heart-healthy tub margarine that is free of trans fats or partially hydrogenated oil. When buying baked goods (cookies, crackers), avoid partially hydrogenated oils. Breads and muffins should be made from whole grains (whole-wheat or whole oat flour, instead of "flour" or "enriched flour"). Buy non-creamy canned soups with reduced salt and no added fats.  FOOD PREPARATION TECHNIQUES  Never deep-fry. If you must fry, either stir-fry, which uses very little fat, or use non-stick cooking sprays. When possible, broil, bake, or roast meats, and steam vegetables. Instead of putting butter or margarine on vegetables, use lemon and herbs, applesauce, and cinnamon (for squash and sweet potatoes). Use nonfat   yogurt, salsa, and low-fat dressings for salads.  LOW-SATURATED FAT / LOW-FAT FOOD SUBSTITUTES Meats / Saturated Fat (g)  Avoid: Steak, marbled (3 oz/85 g) / 11 g  Choose: Steak, lean (3 oz/85 g) / 4 g  Avoid: Hamburger (3 oz/85 g) / 7  g  Choose: Hamburger, lean (3 oz/85 g) / 5 g  Avoid: Ham (3 oz/85 g) / 6 g  Choose: Ham, lean cut (3 oz/85 g) / 2.4 g  Avoid: Chicken, with skin, dark meat (3 oz/85 g) / 4 g  Choose: Chicken, skin removed, dark meat (3 oz/85 g) / 2 g  Avoid: Chicken, with skin, light meat (3 oz/85 g) / 2.5 g  Choose: Chicken, skin removed, light meat (3 oz/85 g) / 1 g Dairy / Saturated Fat (g)  Avoid: Whole milk (1 cup) / 5 g  Choose: Low-fat milk, 2% (1 cup) / 3 g  Choose: Low-fat milk, 1% (1 cup) / 1.5 g  Choose: Skim milk (1 cup) / 0.3 g  Avoid: Hard cheese (1 oz/28 g) / 6 g  Choose: Skim milk cheese (1 oz/28 g) / 2 to 3 g  Avoid: Cottage cheese, 4% fat (1 cup) / 6.5 g  Choose: Low-fat cottage cheese, 1% fat (1 cup) / 1.5 g  Avoid: Ice cream (1 cup) / 9 g  Choose: Sherbet (1 cup) / 2.5 g  Choose: Nonfat frozen yogurt (1 cup) / 0.3 g  Choose: Frozen fruit bar / trace  Avoid: Whipped cream (1 tbs) / 3.5 g  Choose: Nondairy whipped topping (1 tbs) / 1 g Condiments / Saturated Fat (g)  Avoid: Mayonnaise (1 tbs) / 2 g  Choose: Low-fat mayonnaise (1 tbs) / 1 g  Avoid: Butter (1 tbs) / 7 g  Choose: Extra light margarine (1 tbs) / 1 g  Avoid: Coconut oil (1 tbs) / 11.8 g  Choose: Olive oil (1 tbs) / 1.8 g  Choose: Corn oil (1 tbs) / 1.7 g  Choose: Safflower oil (1 tbs) / 1.2 g  Choose: Sunflower oil (1 tbs) / 1.4 g  Choose: Soybean oil (1 tbs) / 2.4 g  Choose: Canola oil (1 tbs) / 1 g Document Released: 01/21/2005 Document Revised: 05/18/2012 Document Reviewed: 04/21/2013 Exit[employer] Patient Information 2015 [employer]. This information is not intended to replace advice given to you by your health care provider. Make sure you discuss any questions you have with your health care provider.  

## 2014-11-07 ENCOUNTER — Encounter: Payer: Self-pay | Admitting: Internal Medicine

## 2014-11-07 ENCOUNTER — Ambulatory Visit (INDEPENDENT_AMBULATORY_CARE_PROVIDER_SITE_OTHER): Payer: Medicare Other | Admitting: Internal Medicine

## 2014-11-07 ENCOUNTER — Telehealth: Payer: Self-pay | Admitting: Internal Medicine

## 2014-11-07 VITALS — BP 142/78 | HR 63 | Temp 97.9°F | Wt 170.0 lb

## 2014-11-07 DIAGNOSIS — K121 Other forms of stomatitis: Secondary | ICD-10-CM | POA: Diagnosis not present

## 2014-11-07 MED ORDER — MAGIC MOUTHWASH W/LIDOCAINE: 5.0000 mL | Freq: Four times a day (QID) | ORAL | Status: DC

## 2014-11-07 NOTE — Progress Notes (Signed)
Subjective:    Patient ID: Lacey Brown, female    DOB: 1940-09-03, 74 y.o.   MRN: [id number]  HPI  Pt presents to the clinic today with c/o lesions in her mouth. She noticed this 2-3 days ago. They are located on her right lower gym. The lesions are tender. She does not recall burning her mouth but did notice this after eating a bowl of hot beans. She denies fever, chills, or other URI symptoms. She denies similar lesions on her hands or feet. She has not tried anything like this in the past. She has not tried anything OTC.  Review of Systems      Past Medical History  Diagnosis Date  . Hypertension   . Hyperlipidemia   . Arthritis   . Asthma   . Chicken pox     Current Outpatient Prescriptions  Medication Sig Dispense Refill  . Cholecalciferol (VITAMIN D3) 2000 UNITS TABS Take 1 tablet by mouth daily.    . DHA-EPA-VITAMIN E PO Take by mouth.    . Fluticasone-Salmeterol (ADVAIR DISKUS) 250-50 MCG/DOSE AEPB Inhale into the lungs.    Marland Kitchen ibuprofen (ADVIL,MOTRIN) 400 MG tablet Take 400 mg by mouth daily as needed.     Marland Kitchen LOSARTAN POTASSIUM PO Take 1 tablet by mouth daily.     Marland Kitchen METOPROLOL TARTRATE PO Take 1 tablet by mouth daily.     . Multiple Minerals-Vitamins (CALCIUM CITRATE PLUS PO) Take 1 tablet by mouth daily.    . Omega-3 Fatty Acids (FISH OIL) 1000 MG CAPS Take 1 capsule by mouth daily.    . vitamin C (ASCORBIC ACID) 500 MG tablet Take 1,000 mg by mouth daily.      No current facility-administered medications for this visit.    Allergies  Allergen Reactions  . Ace Inhibitors     Other reaction(s): Cough  . Codeine Nausea Only    Family History  Problem Relation Age of Onset  . Heart disease Mother   . Heart disease Father   . Arthritis Maternal Grandmother   . Diabetes Neg Hx   . Cancer Neg Hx   . Stroke Neg Hx     Social History   Social History  . Marital Status: Widowed    Spouse Name: N/A  . Number of Children: N/A  . Years of Education: N/A    Occupational History  . Not on file.   Social History Main Topics  . Smoking status: Former Smoker    Quit date: 04/17/2009  . Smokeless tobacco: Never Used  . Alcohol Use: 0.0 oz/week    0 Standard drinks or equivalent per week     Comment: occasional  . Drug Use: No  . Sexual Activity: No   Other Topics Concern  . Not on file   Social History Narrative     Constitutional: Denies fever, malaise, fatigue, headache or abrupt weight changes.  HEENT: Pt reports lesions in her mouth.Denies eye pain, eye redness, ear pain, ringing in the ears, wax buildup, runny nose, nasal congestion, bloody nose, or sore throat. Respiratory: Denies difficulty breathing, shortness of breath, cough or sputum production.    Skin: Denies redness, rashes, lesions or ulcercations.   No other specific complaints in a complete review of systems (except as listed in HPI above).  Objective:   Physical Exam  BP 142/78 mmHg  Pulse 63  Temp(Src) 97.9 F (36.6 C) (Oral)  Wt 170 lb (77.111 kg)  SpO2 97% Wt Readings from Last 3 Encounters:  11/07/14 170 lb (77.111 kg)  10/24/14 170 lb (77.111 kg)    General: Appears her stated age, well developed, well nourished in NAD. Skin: Warm, dry and intact. No rashes, lesions or ulcerations noted. HEENT: Head: normal shape and size; Throat/Mouth: 3 small round, oral aphthous ulcers noted on right buccal mucosa. Neck:  No adenopathy noted.       Assessment & Plan:   Oral ulcers:  eRx for magic mouthwash, use as directed You can also gargle with salt water   RTC as needed or if symptoms persist or worsen

## 2014-11-07 NOTE — Telephone Encounter (Signed)
Rx faxed to pharmacy  

## 2014-11-07 NOTE — Telephone Encounter (Signed)
Pt called asking if the mouthwash Regina prescribed her today had been called in. She contacted [facility] and they said they have no record of the rx. Please call pt at [phone] then rx has been called in.

## 2014-11-07 NOTE — Progress Notes (Signed)
Pre visit review using our clinic review tool, if applicable. No additional management support is needed unless otherwise documented below in the visit note. 

## 2014-11-07 NOTE — Patient Instructions (Signed)
Oral Ulcers Oral ulcers are painful, shallow sores around the lining of the mouth. They can affect the gums, the inside of the lips, and the cheeks. (Sores on the outside of the lips and on the face are different.) They typically first occur in school-aged children and teenagers. Oral ulcers may also be called canker sores or cold sores. CAUSES  Canker sores and cold sores can be caused by many factors including:  Infection.  Injury.  Sun exposure.  Medications.  Emotional stress.  Food allergies.  Vitamin deficiencies.  Toothpastes containing sodium lauryl sulfate. The herpes virus can be the cause of mouth ulcers. The first infection can be severe and cause 10 or more ulcers on the gums, tongue, and lips with fever and difficulty in swallowing. This infection usually occurs between the ages of 1 and 3 years.  SYMPTOMS  The typical sore is about  inch (6 mm) in size and is an oval or round ulcer with red borders. DIAGNOSIS  Your caregiver can diagnose simple oral ulcers by examination. Additional testing is usually not required.  TREATMENT  Treatment is aimed at pain relief. Generally, oral ulcers resolve by themselves within 1 to 2 weeks without medication and are not contagious unless caused by herpes (and other viruses). Antibiotics are not effective with mouth sores. Avoid direct contact with others until the ulcer is completely healed. See your caregiver for follow-up care as recommended. Also:  Offer a soft diet.  Encourage plenty of fluids to prevent dehydration. Popsicles and milk shakes can be helpful.  Avoid acidic and salty foods and drinks such as orange juice.  Infants and young children will often refuse to drink because of pain. Using a teaspoon, cup, or syringe to give small amounts of fluids frequently can help prevent dehydration.  Cold compresses on the face may help reduce pain.  Pain medication can help control soreness.  A solution of diphenhydramine  mixed with a liquid antacid can be useful to decrease the soreness of ulcers. Consult a caregiver for the dosing.  Liquids or ointments with a numbing ingredient may be helpful when used as recommended.  Older children and teenagers can rinse their mouth with a salt-water mixture (1/2 teaspoon of salt in 8 ounces of water) four times a day. This treatment is uncomfortable but may reduce the time the ulcers are present.  There are many over-the-counter throat lozenges and medications available for oral ulcers. Their effectiveness has not been studied.  Consult your medical caregiver prior to using homeopathic treatments for oral ulcers. SEEK MEDICAL CARE IF:   You think your child needs to be seen.  The pain worsens and you cannot control it.  There are 4 or more ulcers.  The lips and gums begin to bleed and crust.  A single mouth ulcer is near a tooth that is causing a toothache or pain.  Your child has a fever, swollen face, or swollen glands.  The ulcers began after starting a medication.  Mouth ulcers keep reoccurring or last more than 2 weeks.  You think your child is not taking adequate fluids. SEEK IMMEDIATE MEDICAL CARE IF:   Your child has a high fever.  Your child is unable to swallow or becomes dehydrated.  Your child looks or acts very ill.  An ulcer caused by a chemical your child accidentally put in their mouth. Document Released: 02/29/2004 Document Revised: 06/07/2013 Document Reviewed: 10/13/2008 ExitCare Patient Information 2015 [employer]. This information is not intended to replace advice   given to you by your health care provider. Make sure you discuss any questions you have with your health care provider.  

## 2014-11-10 DIAGNOSIS — Z23 Encounter for immunization: Secondary | ICD-10-CM | POA: Diagnosis not present

## 2015-01-19 DIAGNOSIS — S300XXA Contusion of lower back and pelvis, initial encounter: Secondary | ICD-10-CM | POA: Diagnosis not present

## 2015-02-08 DIAGNOSIS — S300XXD Contusion of lower back and pelvis, subsequent encounter: Secondary | ICD-10-CM | POA: Diagnosis not present

## 2015-02-08 DIAGNOSIS — S32018D Other fracture of first lumbar vertebra, subsequent encounter for fracture with routine healing: Secondary | ICD-10-CM | POA: Diagnosis not present

## 2015-02-08 DIAGNOSIS — M5136 Other intervertebral disc degeneration, lumbar region: Secondary | ICD-10-CM | POA: Diagnosis not present

## 2015-02-10 ENCOUNTER — Ambulatory Visit (INDEPENDENT_AMBULATORY_CARE_PROVIDER_SITE_OTHER): Payer: Medicare Other | Admitting: Family Medicine

## 2015-02-10 ENCOUNTER — Encounter: Payer: Self-pay | Admitting: Family Medicine

## 2015-02-10 VITALS — BP 150/80 | HR 71 | Temp 98.3°F | Wt 172.0 lb

## 2015-02-10 DIAGNOSIS — J069 Acute upper respiratory infection, unspecified: Secondary | ICD-10-CM | POA: Insufficient documentation

## 2015-02-10 DIAGNOSIS — B9789 Other viral agents as the cause of diseases classified elsewhere: Principal | ICD-10-CM

## 2015-02-10 MED ORDER — AZITHROMYCIN 250 MG PO TABS: ORAL_TABLET | ORAL | Status: DC

## 2015-02-10 NOTE — Patient Instructions (Addendum)
Mucinex Dm twice daily.  Nasal saline 2-3 times a day .  Can try nasal flonase  2 spray per nostril daily.to help with congestion if not improving.  If not improving in 3-4 days, can fill prescription for the antibiotics.  Follow BP at home , goal < 140/90, call if remains elevated.

## 2015-02-10 NOTE — Assessment & Plan Note (Signed)
Recommended symptomatic care, mucinex DM, nasal saline to break up mucus and flonase for swelling in nasal passages.  Pt worried about needing antibiotics with coming snow.. Will prescribe antibiotics for her to hold onto if not continuing to improve as expected in next 3-4 days.

## 2015-02-10 NOTE — Progress Notes (Signed)
Pre visit review using our clinic review tool, if applicable. No additional management support is needed unless otherwise documented below in the visit note. 

## 2015-02-10 NOTE — Progress Notes (Signed)
   Subjective:    Patient ID: Lacey Brown, female    DOB: 1940/07/22, 75 y.o.   MRN: [id number]  Sinusitis This is a new problem. The current episode started in the past 7 days. The problem has been gradually improving since onset. There has been no fever. The pain is mild. Associated symptoms include congestion, coughing and sinus pressure. Pertinent negatives include no ear pain, shortness of breath or sore throat. (Cough keeping her up at night  occ sore throat) Past treatments include nothing. The treatment provided no relief.    Social History /Family History/Past Medical History reviewed and updated if needed. Former remote smoker.  BP elevated with ibuprofen and back pain.  Review of Systems  Constitutional: Negative for fever and fatigue.  HENT: Positive for congestion and sinus pressure. Negative for ear pain and sore throat.   Respiratory: Positive for cough. Negative for shortness of breath.   Cardiovascular: Negative for leg swelling.       Objective:   Physical Exam  Constitutional: Vital signs are normal. She appears well-developed and well-nourished. She is cooperative.  Non-toxic appearance. She does not appear ill. No distress.  Elderly female in NAD.  HENT:  Head: Normocephalic.  Right Ear: Hearing, tympanic membrane, external ear and ear canal normal. Tympanic membrane is not erythematous, not retracted and not bulging.  Left Ear: Hearing, tympanic membrane, external ear and ear canal normal. Tympanic membrane is not erythematous, not retracted and not bulging.  Nose: Mucosal edema and rhinorrhea present. Right sinus exhibits no maxillary sinus tenderness and no frontal sinus tenderness. Left sinus exhibits no maxillary sinus tenderness and no frontal sinus tenderness.  Mouth/Throat: Uvula is midline, oropharynx is clear and moist and mucous membranes are normal.  Eyes: Conjunctivae, EOM and lids are normal. Pupils are equal, round, and reactive to light. Lids  are everted and swept, no foreign bodies found.  Neck: Trachea normal and normal range of motion. Neck supple. Carotid bruit is not present. No thyroid mass and no thyromegaly present.  Cardiovascular: Normal rate, regular rhythm, S1 normal, S2 normal, normal heart sounds, intact distal pulses and normal pulses.  Exam reveals no gallop and no friction rub.   No murmur heard. Pulmonary/Chest: Effort normal and breath sounds normal. No tachypnea. No respiratory distress. She has no decreased breath sounds. She has no wheezes. She has no rhonchi. She has no rales.  Neurological: She is alert.  Skin: Skin is warm, dry and intact. No rash noted.  Psychiatric: Her speech is normal and behavior is normal. Judgment normal. Her mood appears not anxious. Cognition and memory are normal. She does not exhibit a depressed mood.          Assessment & Plan:

## 2015-02-13 ENCOUNTER — Encounter: Payer: Self-pay | Admitting: Podiatry

## 2015-02-13 ENCOUNTER — Ambulatory Visit (INDEPENDENT_AMBULATORY_CARE_PROVIDER_SITE_OTHER): Payer: Medicare Other | Admitting: Podiatry

## 2015-02-13 VITALS — BP 153/60 | HR 76 | Resp 12

## 2015-02-13 DIAGNOSIS — M779 Enthesopathy, unspecified: Secondary | ICD-10-CM | POA: Diagnosis not present

## 2015-02-13 DIAGNOSIS — L84 Corns and callosities: Secondary | ICD-10-CM | POA: Diagnosis not present

## 2015-02-13 NOTE — Progress Notes (Signed)
She presents today chief complaint of a painful second metatarsophalangeal joint of the right foot. She states that she is doing better today but she has this on and off. I saw her last August she states he was doing very well for a long time.  Objective: Vital signs are stable she is alert and oriented 3 pulses remain palpable. She has pain on palpation and in the range of motion of the second metatarsophalangeal joint of the right foot. No hammertoe deformity is noted yet but she does have hallux valgus deformity and tailor's bunion deformity.  Assessment: Capsulitis second metatarsophalangeal joint right foot. Hallux valgus deformity.  Plan: I injected today after sterile Betadine skin prep 2 mg of dexamethasone in the second metatarsophalangeal joint of the right foot. I will follow up with her as needed.

## 2015-02-14 DIAGNOSIS — M545 Low back pain: Secondary | ICD-10-CM | POA: Diagnosis not present

## 2015-02-14 DIAGNOSIS — S39012S Strain of muscle, fascia and tendon of lower back, sequela: Secondary | ICD-10-CM | POA: Diagnosis not present

## 2015-02-20 ENCOUNTER — Other Ambulatory Visit: Payer: Self-pay | Admitting: Internal Medicine

## 2015-02-20 DIAGNOSIS — M545 Low back pain: Secondary | ICD-10-CM | POA: Diagnosis not present

## 2015-02-20 DIAGNOSIS — S39012S Strain of muscle, fascia and tendon of lower back, sequela: Secondary | ICD-10-CM | POA: Diagnosis not present

## 2015-02-20 NOTE — Telephone Encounter (Signed)
Pt request refill advair to [facility]; refill done per protocol. And pt voiced understanding.

## 2015-02-21 ENCOUNTER — Telehealth: Payer: Self-pay

## 2015-02-21 MED ORDER — FLUTICASONE-SALMETEROL 250-50 MCG/DOSE IN AEPB: 1.0000 | INHALATION_SPRAY | Freq: Two times a day (BID) | RESPIRATORY_TRACT | Status: DC

## 2015-02-21 NOTE — Telephone Encounter (Signed)
1 puff Q12H

## 2015-02-21 NOTE — Telephone Encounter (Signed)
Rx sent to pharmacy   

## 2015-02-21 NOTE — Addendum Note (Signed)
Addended by: Lurlean Nanny on: 02/21/2015 04:31 PM   Modules accepted: Orders

## 2015-02-21 NOTE — Telephone Encounter (Signed)
Lacey Brown with [facility] left /vm to verify the instructions for Advair; Advair usually dosed q12 h. Med list has instructions one puff once daily.Please advise.

## 2015-02-22 ENCOUNTER — Encounter: Payer: Self-pay | Admitting: Internal Medicine

## 2015-02-28 DIAGNOSIS — M545 Low back pain: Secondary | ICD-10-CM | POA: Diagnosis not present

## 2015-02-28 DIAGNOSIS — R293 Abnormal posture: Secondary | ICD-10-CM | POA: Diagnosis not present

## 2015-03-01 ENCOUNTER — Encounter: Payer: Self-pay | Admitting: Internal Medicine

## 2015-03-07 DIAGNOSIS — S39012S Strain of muscle, fascia and tendon of lower back, sequela: Secondary | ICD-10-CM | POA: Diagnosis not present

## 2015-03-07 DIAGNOSIS — M545 Low back pain: Secondary | ICD-10-CM | POA: Diagnosis not present

## 2015-05-15 ENCOUNTER — Encounter: Payer: Self-pay | Admitting: Internal Medicine

## 2015-05-30 ENCOUNTER — Telehealth: Payer: Self-pay | Admitting: Internal Medicine

## 2015-05-30 NOTE — Telephone Encounter (Signed)
Patient called and I let her know [facility] hasn't received her records.

## 2015-05-30 NOTE — Telephone Encounter (Signed)
We faxed the release but have not received anything back

## 2015-05-30 NOTE — Telephone Encounter (Signed)
Pt called and would like to know if you have received her previous medical records? She needs a cpe and wants you to tell her when she is due. I told her it would be best for her to call her insurance to see when they have the last known cpe to date so we can make sure it is 56yr 1 day.   Pt request call back to know if you got her records. Please advise

## 2015-06-12 ENCOUNTER — Other Ambulatory Visit: Payer: Self-pay | Admitting: Internal Medicine

## 2015-06-12 NOTE — Telephone Encounter (Signed)
Pt request refill on losartan to [facility] [address]. Pt seen 10/2014 and has [facility] on 08/28/15. Refill x 1 and pt will get updated refills at annual exam.pt voiced understanding.

## 2015-07-04 ENCOUNTER — Encounter: Payer: Self-pay | Admitting: Internal Medicine

## 2015-07-04 ENCOUNTER — Ambulatory Visit (INDEPENDENT_AMBULATORY_CARE_PROVIDER_SITE_OTHER): Payer: Medicare Other | Admitting: Internal Medicine

## 2015-07-04 VITALS — BP 148/68 | HR 73 | Temp 98.0°F | Wt 166.0 lb

## 2015-07-04 DIAGNOSIS — W57XXXA Bitten or stung by nonvenomous insect and other nonvenomous arthropods, initial encounter: Secondary | ICD-10-CM

## 2015-07-04 DIAGNOSIS — S30860A Insect bite (nonvenomous) of lower back and pelvis, initial encounter: Secondary | ICD-10-CM | POA: Diagnosis not present

## 2015-07-04 NOTE — Patient Instructions (Signed)
Tick Bite Information Ticks are insects that attach themselves to the skin and draw blood for food. There are various types of ticks. Common types include wood ticks and deer ticks. Most ticks live in shrubs and grassy areas. Ticks can climb onto your body when you make contact with leaves or grass where the tick is waiting. The most common places on the body for ticks to attach themselves are the scalp, neck, armpits, waist, and groin. Most tick bites are harmless, but sometimes ticks carry germs that cause diseases. These germs can be spread to a person during the tick's feeding process. The chance of a disease spreading through a tick bite depends on:   The type of tick.  Time of year.   How long the tick is attached.   Geographic location.  HOW CAN YOU PREVENT TICK BITES? Take these steps to help prevent tick bites when you are outdoors:  Wear protective clothing. Long sleeves and long pants are best.   Wear white clothes so you can see ticks more easily.  Tuck your pant legs into your socks.   If walking on a trail, stay in the middle of the trail to avoid brushing against bushes.  Avoid walking through areas with long grass.  Put insect repellent on all exposed skin and along boot tops, pant legs, and sleeve cuffs.   Check clothing, hair, and skin repeatedly and before going inside.   Brush off any ticks that are not attached.  Take a shower or bath as soon as possible after being outdoors.  WHAT IS THE PROPER WAY TO REMOVE A TICK? Ticks should be removed as soon as possible to help prevent diseases caused by tick bites. 1. If latex gloves are available, put them on before trying to remove a tick.  2. Using fine-point tweezers, grasp the tick as close to the skin as possible. You may also use curved forceps or a tick removal tool. Grasp the tick as close to its head as possible. Avoid grasping the tick on its body. 3. Pull gently with steady upward pressure until  the tick lets go. Do not twist the tick or jerk it suddenly. This may break off the tick's head or mouth parts. 4. Do not squeeze or crush the tick's body. This could force disease-carrying fluids from the tick into your body.  5. After the tick is removed, wash the bite area and your hands with soap and water or other disinfectant such as alcohol. 6. Apply a small amount of antiseptic cream or ointment to the bite site.  7. Wash and disinfect any instruments that were used.  Do not try to remove a tick by applying a hot match, petroleum jelly, or fingernail polish to the tick. These methods do not work and may increase the chances of disease being spread from the tick bite.  WHEN SHOULD YOU SEEK MEDICAL CARE? Contact your health care provider if you are unable to remove a tick from your skin or if a part of the tick breaks off and is stuck in the skin.  After a tick bite, you need to be aware of signs and symptoms that could be related to diseases spread by ticks. Contact your health care provider if you develop any of the following in the days or weeks after the tick bite:  Unexplained fever.  Rash. A circular rash that appears days or weeks after the tick bite may indicate the possibility of Lyme disease. The rash may resemble   a target with a bull's-eye and may occur at a different part of your body than the tick bite.  Redness and swelling in the area of the tick bite.   Tender, swollen lymph glands.   Diarrhea.   Weight loss.   Cough.   Fatigue.   Muscle, joint, or bone pain.   Abdominal pain.   Headache.   Lethargy or a change in your level of consciousness.  Difficulty walking or moving your legs.   Numbness in the legs.   Paralysis.  Shortness of breath.   Confusion.   Repeated vomiting.    This information is not intended to replace advice given to you by your health care provider. Make sure you discuss any questions you have with your health  care provider.   Document Released: 01/19/2000 Document Revised: 02/11/2014 Document Reviewed: 07/01/2012 Elsevier Interactive Patient Education 2016 [employer]  

## 2015-07-04 NOTE — Progress Notes (Signed)
Subjective:    Patient ID: Lacey Brown, female    DOB: July 16, 1940, 75 y.o.   MRN: [id number]  HPI  Pt presents to the clinic today with c/o a tick bite to her left back. She is not sure how long the tick was on hr. She did have a friend remove it. She has noticed some redness around the bite but denies bullseye rash, generalized rash, fever, chills, nausea, or joint pain. She has not put anything on the area.  Review of Systems      Past Medical History  Diagnosis Date  . Hypertension   . Hyperlipidemia   . Arthritis   . Asthma   . Chicken pox     Current Outpatient Prescriptions  Medication Sig Dispense Refill  . Cholecalciferol (VITAMIN D3) 2000 UNITS TABS Take 1 tablet by mouth daily.    . DHA-EPA-VITAMIN E PO Take by mouth.    . Fluticasone-Salmeterol (ADVAIR DISKUS) 250-50 MCG/DOSE AEPB Inhale 1 puff into the lungs 2 (two) times daily. 60 each 1  . HYDROcodone-acetaminophen (NORCO/VICODIN) 5-325 MG tablet     . ibuprofen (ADVIL,MOTRIN) 400 MG tablet Take 400 mg by mouth daily as needed.     Marland Kitchen losartan (COZAAR) 50 MG tablet TAKE 1 TABLET BY MOUTH DAILY 90 tablet 0  . metoprolol succinate (TOPROL-XL) 25 MG 24 hr tablet     . Multiple Minerals-Vitamins (CALCIUM CITRATE PLUS PO) Take 1 tablet by mouth daily.    . Omega-3 Fatty Acids (FISH OIL) 1000 MG CAPS Take 1 capsule by mouth daily.    . vitamin C (ASCORBIC ACID) 500 MG tablet Take 1,000 mg by mouth daily.      No current facility-administered medications for this visit.    Allergies  Allergen Reactions  . Ace Inhibitors     Other reaction(s): Cough  . Codeine Nausea Only    Family History  Problem Relation Age of Onset  . Heart disease Mother   . Heart disease Father   . Arthritis Maternal Grandmother   . Diabetes Neg Hx   . Cancer Neg Hx   . Stroke Neg Hx     Social History   Social History  . Marital Status: Widowed    Spouse Name: N/A  . Number of Children: N/A  . Years of Education: N/A    Occupational History  . Not on file.   Social History Main Topics  . Smoking status: Former Smoker    Quit date: 04/17/2009  . Smokeless tobacco: Never Used  . Alcohol Use: 0.0 oz/week    0 Standard drinks or equivalent per week     Comment: occasional  . Drug Use: No  . Sexual Activity: No   Other Topics Concern  . Not on file   Social History Narrative     Constitutional: Denies fever, malaise, fatigue, headache or abrupt weight changes.  Gastrointestinal: Denies abdominal pain, bloating, constipation, diarrhea or blood in the stool.  Musculoskeletal: Denies decrease in range of motion, difficulty with gait, muscle pain or joint pain and swelling.  Skin: Pt reports lesion of left side of back. Denies rashes, or ulcercations.  Neurological: Denies dizziness, difficulty with memory, difficulty with speech or problems with balance and coordination.    No other specific complaints in a complete review of systems (except as listed in HPI above).  Objective:   Physical Exam   BP 148/68 mmHg  Pulse 73  Temp(Src) 98 F (36.7 C) (Oral)  Wt 166 lb (75.297  kg)  SpO2 98% Wt Readings from Last 3 Encounters:  07/04/15 166 lb (75.297 kg)  02/10/15 172 lb (78.019 kg)  11/07/14 170 lb (77.111 kg)    General: Appears her stated age, in NAD. Skin: Tick bite noted to left upper back. 1 cm area of induration noted. No warm, redness or cellulitis noted.  BMET    Component Value Date/Time   K 4.1 06/08/2014   CREATININE 0.8 06/08/2014    Lipid Panel  No results found for: CHOL, TRIG, HDL, CHOLHDL, VLDL, LDLCALC  CBC    Component Value Date/Time   HGB 12.0 06/08/2014   HCT 36 06/08/2014    Hgb A1C No results found for: HGBA1C      Assessment & Plan:   Tick bite of left upper back:  No indication for erythema migrans or infection Hydrocortisone cream as needed for itching  RTC in 2 months for annual exam

## 2015-07-04 NOTE — Progress Notes (Signed)
Pre visit review using our clinic review tool, if applicable. No additional management support is needed unless otherwise documented below in the visit note. 

## 2015-08-04 ENCOUNTER — Other Ambulatory Visit: Payer: Self-pay | Admitting: Internal Medicine

## 2015-08-04 NOTE — Telephone Encounter (Signed)
I do not see where this has been filled by you yet--please advise if okay to refill

## 2015-08-04 NOTE — Telephone Encounter (Signed)
Sent electronically 

## 2015-08-28 ENCOUNTER — Ambulatory Visit (INDEPENDENT_AMBULATORY_CARE_PROVIDER_SITE_OTHER): Payer: Medicare Other | Admitting: Internal Medicine

## 2015-08-28 ENCOUNTER — Encounter: Payer: Self-pay | Admitting: Internal Medicine

## 2015-08-28 VITALS — BP 144/80 | HR 64 | Temp 98.4°F | Ht 60.0 in | Wt 166.0 lb

## 2015-08-28 DIAGNOSIS — Z1239 Encounter for other screening for malignant neoplasm of breast: Secondary | ICD-10-CM

## 2015-08-28 DIAGNOSIS — I10 Essential (primary) hypertension: Secondary | ICD-10-CM | POA: Diagnosis not present

## 2015-08-28 DIAGNOSIS — E785 Hyperlipidemia, unspecified: Secondary | ICD-10-CM | POA: Diagnosis not present

## 2015-08-28 DIAGNOSIS — K219 Gastro-esophageal reflux disease without esophagitis: Secondary | ICD-10-CM

## 2015-08-28 DIAGNOSIS — Z13820 Encounter for screening for osteoporosis: Secondary | ICD-10-CM

## 2015-08-28 DIAGNOSIS — M1990 Unspecified osteoarthritis, unspecified site: Secondary | ICD-10-CM

## 2015-08-28 DIAGNOSIS — Z0000 Encounter for general adult medical examination without abnormal findings: Secondary | ICD-10-CM | POA: Diagnosis not present

## 2015-08-28 DIAGNOSIS — J4530 Mild persistent asthma, uncomplicated: Secondary | ICD-10-CM | POA: Diagnosis not present

## 2015-08-28 LAB — LIPID PANEL
CHOL/HDL RATIO: 3
Cholesterol: 221 mg/dL — ABNORMAL HIGH (ref 0–200)
HDL: 66.7 mg/dL (ref >39.00)
LDL Cholesterol: 136 mg/dL — ABNORMAL HIGH (ref 0–99)
NONHDL: 154.52
Triglycerides: 91 mg/dL (ref 0.0–149.0)
VLDL: 18.2 mg/dL (ref 0.0–40.0)

## 2015-08-28 LAB — COMPREHENSIVE METABOLIC PANEL
ALK PHOS: 57 U/L (ref 39–117)
ALT: 13 U/L (ref 0–35)
AST: 15 U/L (ref 0–37)
Albumin: 4.3 g/dL (ref 3.5–5.2)
BUN: 16 mg/dL (ref 6–23)
CO2: 31 mEq/L (ref 19–32)
Calcium: 9.7 mg/dL (ref 8.4–10.5)
Chloride: 96 mEq/L (ref 96–112)
Creatinine, Ser: 0.65 mg/dL (ref 0.40–1.20)
GFR: 94.08 mL/min (ref >60.00)
GLUCOSE: 90 mg/dL (ref 70–99)
POTASSIUM: 4.4 meq/L (ref 3.5–5.1)
Sodium: 132 mEq/L — ABNORMAL LOW (ref 135–145)
TOTAL PROTEIN: 7.1 g/dL (ref 6.0–8.3)
Total Bilirubin: 0.4 mg/dL (ref 0.2–1.2)

## 2015-08-28 LAB — CBC
HCT: 38.4 % (ref 36.0–46.0)
HEMOGLOBIN: 12.7 g/dL (ref 12.0–15.0)
MCHC: 33.1 g/dL (ref 30.0–36.0)
MCV: 90.1 fl (ref 78.0–100.0)
Platelets: 286 10*3/uL (ref 150.0–400.0)
RBC: 4.26 Mil/uL (ref 3.87–5.11)
RDW: 14.7 % (ref 11.5–15.5)
WBC: 9.4 10*3/uL (ref 4.0–10.5)

## 2015-08-28 LAB — VITAMIN D 25 HYDROXY (VIT D DEFICIENCY, FRACTURES): VITD: 42.38 ng/mL (ref 30.00–100.00)

## 2015-08-28 MED ORDER — METOPROLOL SUCCINATE ER 25 MG PO TB24: 25.0000 mg | ORAL_TABLET | Freq: Every day | ORAL | 1 refills | Status: DC

## 2015-08-28 MED ORDER — LOSARTAN POTASSIUM 50 MG PO TABS: 50.0000 mg | ORAL_TABLET | Freq: Every day | ORAL | 1 refills | Status: DC

## 2015-08-28 MED ORDER — FLUTICASONE-SALMETEROL 250-50 MCG/DOSE IN AEPB: 1.0000 | INHALATION_SPRAY | Freq: Two times a day (BID) | RESPIRATORY_TRACT | 1 refills | Status: DC

## 2015-08-28 NOTE — Assessment & Plan Note (Addendum)
Lipid profile today Continue Fish Oil daily Encouraged her to consume a low fat diet

## 2015-08-28 NOTE — Patient Instructions (Signed)

## 2015-08-28 NOTE — Assessment & Plan Note (Signed)
Pain controlled with Ibuprofen prn

## 2015-08-28 NOTE — Assessment & Plan Note (Signed)
Continue Advair prn

## 2015-08-28 NOTE — Progress Notes (Signed)
HPI:  Pt presents to the clinic today for her Medicare Wellness Exam. She is also due for followup of chronic conditions.  HTN: She takes Losartan and Metoprolol daily as prescribed. Her BP today is 144/78. She denies chest pain, chest tightness or shortness of breath. There is no ECG on file.   HLD: She takes Fish Oil daily. There is no lipid profile on file. She has never taking any cholesterol lowering medication. She tries to consume a low fat diet.   Arthritis: Mainly in her back. She takes Ibuprofen as needed.   Asthma: She uses Advair only when she feels like she needs it. She no longer smokes.  GERD: Occasionally. Triggered by spicy foods. She takes Omeprazole as needed with good relief.  Past Medical History:  Diagnosis Date  . Arthritis   . Asthma   . Chicken pox   . Hyperlipidemia   . Hypertension     Current Outpatient Prescriptions  Medication Sig Dispense Refill  . Cholecalciferol (VITAMIN D3) 2000 UNITS TABS Take 1 tablet by mouth daily.    . DHA-EPA-VITAMIN E PO Take by mouth.    . Fluticasone-Salmeterol (ADVAIR DISKUS) 250-50 MCG/DOSE AEPB Inhale 1 puff into the lungs 2 (two) times daily. 60 each 1  . HYDROcodone-acetaminophen (NORCO/VICODIN) 5-325 MG tablet     . ibuprofen (ADVIL,MOTRIN) 400 MG tablet Take 400 mg by mouth daily as needed.     Marland Kitchen losartan (COZAAR) 50 MG tablet TAKE 1 TABLET BY MOUTH DAILY 90 tablet 0  . metoprolol succinate (TOPROL-XL) 25 MG 24 hr tablet TAKE 1 TABLET BY MOUTH DAILY 90 tablet 0  . Multiple Minerals-Vitamins (CALCIUM CITRATE PLUS PO) Take 1 tablet by mouth daily.    . Omega-3 Fatty Acids (FISH OIL) 1000 MG CAPS Take 1 capsule by mouth daily.    . vitamin C (ASCORBIC ACID) 500 MG tablet Take 1,000 mg by mouth daily.      No current facility-administered medications for this visit.     Allergies  Allergen Reactions  . Ace Inhibitors     Other reaction(s): Cough  . Codeine Nausea Only    Family History  Problem Relation Age  of Onset  . Heart disease Mother   . Heart disease Father   . Arthritis Maternal Grandmother   . Diabetes Neg Hx   . Cancer Neg Hx   . Stroke Neg Hx     Social History   Social History  . Marital status: Widowed    Spouse name: N/A  . Number of children: N/A  . Years of education: N/A   Occupational History  . Not on file.   Social History Main Topics  . Smoking status: Former Smoker    Quit date: 04/17/2009  . Smokeless tobacco: Never Used  . Alcohol use 0.0 oz/week     Comment: occasional  . Drug use: No  . Sexual activity: No   Other Topics Concern  . Not on file   Social History Narrative  . No narrative on file    Hospitiliaztions: None  Health Maintenance:    Flu: 11/2013  Tetanus: unsure of her last one  Pneumovax: 10/2009  Prevnar: 06/2014  Zostovax: 11/2009  Pap Smear: Had it at [facility],  unsure of year  Mammogram: 06/2014,  [facility].  Bone Density: 06/2007  Colon Screening: 09/2005, [place] Screening: yearly, Dr. Gwynneth Aliment in [place], [place]  Dentist: as needed    Providers:  PCP: Webb Silversmith, NP-C  Podiatrist: Dr. Milinda Pointer    I have personally reviewed and have noted:  1. [name] medical and social history 2. Their use of alcohol, tobacco or illicit drugs 3. Their current medications and supplements 4. [name] functional ability including ADL's, fall risks, home  safety risks and hearing or visual impairment. 5. Diet and physical activities 6. Evidence for depression or mood disorder  Subjective:   Review of Systems:   Constitutional: Denies fever, malaise, fatigue, headache or abrupt weight changes.  HEENT: Denies eye pain, eye redness, ear pain, ringing in the ears, wax buildup, runny nose, nasal congestion, bloody nose, or sore throat. Respiratory: Denies difficulty breathing, shortness of breath, cough or sputum production.   Cardiovascular: Denies chest  pain, chest tightness, palpitations or swelling in the hands or feet.  Gastrointestinal: Denies abdominal pain, bloating, constipation, diarrhea or blood in the stool.  GU: Denies urgency, frequency, pain with urination, burning sensation, blood in urine, odor or discharge. Musculoskeletal: Pt reports back pain. Denies decrease in range of motion, difficulty with gait, muscle pain or joint swelling.  Skin: Denies redness, rashes, lesions or ulcercations.  Neurological: Denies dizziness, difficulty with memory, difficulty with speech or problems with balance and coordination.  Psych: Denies anxiety, depression, SI/HI.  No other specific complaints in a complete review of systems (except as listed in HPI above).  Objective:  PE:   BP (!) 144/80 (BP Location: Right Arm, Patient Position: Sitting, Cuff Size: Large)   Pulse 64   Temp 98.4 F (36.9 C) (Oral)   Ht 5' (1.524 m)   Wt 166 lb (75.3 kg)   SpO2 98%   BMI 32.42 kg/m   Wt Readings from Last 3 Encounters:  07/04/15 166 lb (75.3 kg)  02/10/15 172 lb (78 kg)  11/07/14 170 lb (77.1 kg)    General: Appears her stated age, well developed, well nourished in NAD. Skin: Warm, dry and intact.  HEENT: Head: normal shape and size; Eyes: sclera white, no icterus, conjunctiva pink, PERRLA and EOMs intact; Ears: Tm's gray and intact, normal light reflex; Throat/Mouth: Teeth present, mucosa pink and moist, no exudate, lesions or ulcerations noted.  Neck: Neck supple, trachea midline. No masses, lumps or thyromegaly present.  Cardiovascular: Normal rate and rhythm. S1,S2 noted.  No murmur, rubs or gallops noted. No JVD or BLE edema. No carotid bruits noted. Pulmonary/Chest: Normal effort and positive vesicular breath sounds. No respiratory distress. No wheezes, rales or ronchi noted.  Abdomen: Soft and nontender. Normal bowel sounds. No distention or masses noted. Liver, spleen and kidneys non palpable. Musculoskeletal: Strength 5/5 BUE/BLE. No  signs of joint swelling.  Neurological: Alert and oriented. Cranial nerves II-XII grossly intact. Coordination normal.  Psychiatric: Mood and affect normal. Behavior is normal. Judgment and thought content normal.    BMET    Component Value Date/Time   K 4.1 06/08/2014   CREATININE 0.8 06/08/2014    Lipid Panel  No results found for: CHOL, TRIG, HDL, CHOLHDL, VLDL, LDLCALC  CBC    Component Value Date/Time   HGB 12.0 06/08/2014   HCT 36 06/08/2014    Hgb A1C No results found for: HGBA1C    Assessment and Plan:   Medicare Annual Wellness Visit:  Diet: She consumes meats. She eats fruits and veggies foods. She does eat some fried food. She drinks mostly unsweet tea. Physical activity: Sedentary Depression/mood screen: Negative Hearing: Intact to whispered voice Visual acuity: Grossly normal, performs annual eye exam  ADLs: Capable Fall risk: Fell x 1 in the last year Home safety: Good Cognitive evaluation: Intact to orientation, naming, recall and repetition EOL planning: No adv directives, full code/ I agree  Preventative Medicine: Encouraged her to get a flu shot in the fall. She declines tetanus vaccine at this time. Pneumovax, Prevnar and Zostovax UTD. Mammogram ordered- she will call [place] to schedule. She is no longer screening for cervical cancer or colon cancer. Bone density exam ordered. Encouraged her to see an eye doctor and dentist annually.    Next appointment: 1 year Medicare Wellness/follow up   Webb Silversmith, NP

## 2015-08-28 NOTE — Assessment & Plan Note (Signed)
Controlled Continue Losartan and Metoprolol

## 2015-08-28 NOTE — Assessment & Plan Note (Signed)
Discussed avoiding triggers Continue Prilosec OTC

## 2015-08-30 ENCOUNTER — Other Ambulatory Visit: Payer: Self-pay | Admitting: Internal Medicine

## 2015-08-30 ENCOUNTER — Telehealth: Payer: Self-pay | Admitting: Internal Medicine

## 2015-08-30 DIAGNOSIS — Z13820 Encounter for screening for osteoporosis: Secondary | ICD-10-CM

## 2015-08-30 DIAGNOSIS — Z1239 Encounter for other screening for malignant neoplasm of breast: Secondary | ICD-10-CM

## 2015-08-30 NOTE — Telephone Encounter (Signed)
She does need a diagnostic mammogram, just a screening mammogram.

## 2015-08-30 NOTE — Telephone Encounter (Signed)
Your office note from 7/24 stated she was going to call [place] to make appointment nothing was noted about diagnostic mammogram it said bone density would be ordered.  If you want her to have bone density need order

## 2015-08-30 NOTE — Telephone Encounter (Signed)
Diagnostic mammogram cancelled, screening mammogram ordered, bone density ordered

## 2015-08-30 NOTE — Telephone Encounter (Signed)
For diagnostic mammograms I need uni right and uni left ultra sound before I can make appointment thanks

## 2015-09-12 ENCOUNTER — Telehealth: Payer: Self-pay | Admitting: Internal Medicine

## 2015-09-12 ENCOUNTER — Other Ambulatory Visit: Payer: Self-pay | Admitting: Internal Medicine

## 2015-09-12 DIAGNOSIS — E2839 Other primary ovarian failure: Secondary | ICD-10-CM

## 2015-09-12 NOTE — Telephone Encounter (Signed)
Cherish,[facility],called to get order changed in Epic.  The order is for Screening Bone Density and insurance doesn't cover a screening Bone Density.  You can use as the diagnosis Osteopenia or Estrogen Deficiency. Cherish said it will come up on her computer when it's changed.

## 2015-09-12 NOTE — Telephone Encounter (Signed)
Order changed.

## 2015-09-26 ENCOUNTER — Ambulatory Visit: Payer: Medicare Other

## 2015-09-26 ENCOUNTER — Other Ambulatory Visit: Payer: Medicare Other

## 2015-10-03 ENCOUNTER — Ambulatory Visit
Admission: RE | Admit: 2015-10-03 | Discharge: 2015-10-03 | Disposition: A | Discharge status: Home / Self Care | Payer: Medicare Other | Source: Ambulatory Visit | Attending: Internal Medicine | Admitting: Internal Medicine

## 2015-10-03 DIAGNOSIS — E2839 Other primary ovarian failure: Secondary | ICD-10-CM

## 2015-10-03 DIAGNOSIS — M810 Age-related osteoporosis without current pathological fracture: Secondary | ICD-10-CM | POA: Diagnosis not present

## 2015-10-03 DIAGNOSIS — Z780 Asymptomatic menopausal state: Secondary | ICD-10-CM | POA: Diagnosis not present

## 2015-10-03 DIAGNOSIS — Z1231 Encounter for screening mammogram for malignant neoplasm of breast: Secondary | ICD-10-CM | POA: Diagnosis not present

## 2015-10-03 DIAGNOSIS — Z1239 Encounter for other screening for malignant neoplasm of breast: Secondary | ICD-10-CM

## 2015-10-17 ENCOUNTER — Telehealth: Payer: Self-pay | Admitting: Internal Medicine

## 2015-10-17 NOTE — Telephone Encounter (Signed)
Please call patient back with results of her Mammogram and Bone Density.

## 2015-10-19 NOTE — Telephone Encounter (Signed)
Pt called checking on mammogram and bone density results Best number [phone]

## 2015-10-20 NOTE — Telephone Encounter (Signed)
Her mammogram is normal.  I sent a phone note on 8/29 to call pt and tell her she has osteoporosis, per bone density scan. Has she ever had treatment for osteoporosis before? If not, would she be interested in treatment for this?

## 2015-10-27 NOTE — Telephone Encounter (Signed)
It is not absolutely necessary, but she should continue her calcium and vit d supplement and make sure she is getting 30 minutes of weight bearing activity daily.

## 2015-10-27 NOTE — Telephone Encounter (Signed)
Spoke with patient regarding test results and specifically about the bone density and subsequent osteoporosis.  She is not interested in taking a prescription medication for osteoporosis unless NP feels it is absolutely necessary at this point.    Currently patient takes Calcium plus D 1 po qd but is unsure of exact mg strength.  She also would like for [place] to mail her a copy of her bone density results as she is not on my chart.    Please advise.

## 2015-10-27 NOTE — Telephone Encounter (Signed)
Lm on pts vm requesting a call back 

## 2015-10-30 NOTE — Telephone Encounter (Signed)
Patient returned call.  Please call her back at [phone].

## 2015-10-31 NOTE — Telephone Encounter (Signed)
Patient returned Melanie's call. °

## 2015-10-31 NOTE — Telephone Encounter (Signed)
She already has Calcium Citrate on her list, is she not taking that?

## 2015-10-31 NOTE — Telephone Encounter (Signed)
Pt called wanting to get a call from Lacey Brown  Regarding what type of calcium she should take.   Pt stated she has called several times and no one has called her back Best number [phone]

## 2015-10-31 NOTE — Telephone Encounter (Signed)
lmovm for pt to return my call 

## 2015-11-06 NOTE — Telephone Encounter (Signed)
Pt returned melanies call -please call back

## 2015-11-10 NOTE — Telephone Encounter (Addendum)
Pt said she is taking 2000u of Vitamin D. She is also taking a Calcium-Magnesium-Zinc-Vit D Combo with 500mg  of Calcium and 800u of Vit D.   Pt has read up on Fosamax and was worried about the side effects. Would like to talk to Rollene Fare if possible.

## 2015-11-10 NOTE — Telephone Encounter (Signed)
If she wants to discuss it with me, she can make a appt to discuss osteoporosis treatment.

## 2015-11-13 NOTE — Telephone Encounter (Signed)
I had previously spoken to pt for 16 mins via telephone about the medication treatment and that it is usually taken up to 2 yrs per Rollene Fare, it just was not documented. I will call pt to see if she would like to come in for an OV

## 2015-11-15 DIAGNOSIS — H25813 Combined forms of age-related cataract, bilateral: Secondary | ICD-10-CM | POA: Diagnosis not present

## 2015-11-30 DIAGNOSIS — Z23 Encounter for immunization: Secondary | ICD-10-CM | POA: Diagnosis not present

## 2016-03-01 ENCOUNTER — Other Ambulatory Visit: Payer: Self-pay

## 2016-03-01 MED ORDER — FLUTICASONE-SALMETEROL 250-50 MCG/DOSE IN AEPB: 1.0000 | INHALATION_SPRAY | Freq: Two times a day (BID) | RESPIRATORY_TRACT | 1 refills | Status: DC

## 2016-03-11 ENCOUNTER — Ambulatory Visit (INDEPENDENT_AMBULATORY_CARE_PROVIDER_SITE_OTHER): Payer: Medicare Other | Admitting: Internal Medicine

## 2016-03-11 ENCOUNTER — Other Ambulatory Visit: Payer: Self-pay

## 2016-03-11 ENCOUNTER — Encounter: Payer: Self-pay | Admitting: Internal Medicine

## 2016-03-11 VITALS — BP 140/76 | HR 69 | Temp 98.0°F | Wt 171.0 lb

## 2016-03-11 DIAGNOSIS — J029 Acute pharyngitis, unspecified: Secondary | ICD-10-CM

## 2016-03-11 DIAGNOSIS — R110 Nausea: Secondary | ICD-10-CM

## 2016-03-11 MED ORDER — METOPROLOL SUCCINATE ER 25 MG PO TB24: 25.0000 mg | ORAL_TABLET | Freq: Every day | ORAL | 1 refills | Status: DC

## 2016-03-11 MED ORDER — LOSARTAN POTASSIUM 50 MG PO TABS: 50.0000 mg | ORAL_TABLET | Freq: Every day | ORAL | 1 refills | Status: DC

## 2016-03-11 NOTE — Telephone Encounter (Signed)
Pt request refill metoprolol and losartan to [facility]; last annual 08/28/15. Refilled per protocol and pt voiced understanding.

## 2016-03-11 NOTE — Progress Notes (Signed)
Subjective:    Patient ID: Lacey Brown, female    DOB: 1940-10-02, 76 y.o.   MRN: [id number]  HPI  Pt presents to the clinic today with c/o sore throat. This started this morning. She denies difficulty swallowing. She does have some associated nausea. She denies runny nose, nasal congestion, ear pain or cough. She denies fever, chills or body aches. She has not tried anything OTC for this. She has had sick contacts with similar symptoms, however she reports her symptoms have resolved at this time.  Review of Systems      Past Medical History:  Diagnosis Date  . Arthritis   . Asthma   . Chicken pox   . Hyperlipidemia   . Hypertension     Current Outpatient Prescriptions  Medication Sig Dispense Refill  . Cholecalciferol (VITAMIN D3) 2000 UNITS TABS Take 1 tablet by mouth daily.    . Fluticasone-Salmeterol (ADVAIR DISKUS) 250-50 MCG/DOSE AEPB Inhale 1 puff into the lungs 2 (two) times daily. 60 each 1  . ibuprofen (ADVIL,MOTRIN) 400 MG tablet Take 400 mg by mouth daily as needed.     Marland Kitchen losartan (COZAAR) 50 MG tablet Take 1 tablet (50 mg total) by mouth daily. 90 tablet 1  . metoprolol succinate (TOPROL-XL) 25 MG 24 hr tablet Take 1 tablet (25 mg total) by mouth daily. 90 tablet 1  . Multiple Minerals-Vitamins (CALCIUM CITRATE PLUS PO) Take 1 tablet by mouth daily.    . Omega-3 Fatty Acids (FISH OIL) 1000 MG CAPS Take 1 capsule by mouth daily.    . vitamin C (ASCORBIC ACID) 500 MG tablet Take 1,000 mg by mouth daily.      No current facility-administered medications for this visit.     Allergies  Allergen Reactions  . Ace Inhibitors     Other reaction(s): Cough  . Codeine Nausea Only    Family History  Problem Relation Age of Onset  . Heart disease Mother   . Heart disease Father   . Arthritis Maternal Grandmother   . Diabetes Neg Hx   . Cancer Neg Hx   . Stroke Neg Hx     Social History   Social History  . Marital status: Widowed    Spouse name: N/A  .  Number of children: N/A  . Years of education: N/A   Occupational History  . Not on file.   Social History Main Topics  . Smoking status: Former Smoker    Quit date: 04/17/2009  . Smokeless tobacco: Never Used  . Alcohol use 0.0 oz/week     Comment: occasional  . Drug use: No  . Sexual activity: No   Other Topics Concern  . Not on file   Social History Narrative  . No narrative on file     Constitutional: Denies fever, malaise, fatigue, headache or abrupt weight changes.  HEENT: Pt reports sore throat. Denies eye pain, eye redness, ear pain, ringing in the ears, wax buildup, runny nose, nasal congestion, bloody nose. Respiratory: Denies difficulty breathing, shortness of breath, cough or sputum production.   Cardiovascular: Denies chest pain, chest tightness, palpitations or swelling in the hands or feet.  Gastrointestinal: Pt reports nausea. Denies abdominal pain, bloating, constipation, diarrhea or blood in the stool.   No other specific complaints in a complete review of systems (except as listed in HPI above).  Objective:   Physical Exam   BP 140/76   Pulse 69   Temp 98 F (36.7 C) (Oral)   Wt  171 lb (77.6 kg)   SpO2 97%   BMI 33.40 kg/m  Wt Readings from Last 3 Encounters:  03/11/16 171 lb (77.6 kg)  08/28/15 166 lb (75.3 kg)  07/04/15 166 lb (75.3 kg)    General: Appears her stated age,  in NAD. HEENT: Head: normal shape and size; Throat/Mouth: Teeth present, mucosa pink and moist, no exudate, lesions or ulcerations noted.  Neck:  No adenopathy noted.  Cardiovascular: Normal rate and rhythm.  Pulmonary/Chest: Normal effort and positive vesicular breath sounds. No respiratory distress. No wheezes, rales or ronchi noted.  Abdomen: Soft and nontender. Normal bowel sounds.    BMET    Component Value Date/Time   NA 132 (L) 08/28/2015 1510   K 4.4 08/28/2015 1510   CL 96 08/28/2015 1510   CO2 31 08/28/2015 1510   GLUCOSE 90 08/28/2015 1510   BUN 16  08/28/2015 1510   CREATININE 0.65 08/28/2015 1510   CALCIUM 9.7 08/28/2015 1510    Lipid Panel     Component Value Date/Time   CHOL 221 (H) 08/28/2015 1510   TRIG 91.0 08/28/2015 1510   HDL 66.70 08/28/2015 1510   CHOLHDL 3 08/28/2015 1510   VLDL 18.2 08/28/2015 1510   LDLCALC 136 (H) 08/28/2015 1510    CBC    Component Value Date/Time   WBC 9.4 08/28/2015 1510   RBC 4.26 08/28/2015 1510   HGB 12.7 08/28/2015 1510   HCT 38.4 08/28/2015 1510   PLT 286.0 08/28/2015 1510   MCV 90.1 08/28/2015 1510   MCHC 33.1 08/28/2015 1510   RDW 14.7 08/28/2015 1510    Hgb A1C No results found for: HGBA1C         Assessment & Plan:   Sore throat and nausea:  Per pt, symptoms have resolved at this time Will monitor for now  RTC as needed or if symptoms persist or worsen BAITY, REGINA, NP

## 2016-03-11 NOTE — Patient Instructions (Signed)
Sore Throat When you have a sore throat, your throat may:  Hurt.  Burn.  Feel irritated.  Feel scratchy. Many things can cause a sore throat, including:  An infection.  Allergies.  Dryness in the air.  Smoke or pollution.  Gastroesophageal reflux disease (GERD).  A tumor. A sore throat can be the first sign of another sickness. It can happen with other problems, like coughing or a fever. Most sore throats go away without treatment. Follow these instructions at home:  Take over-the-counter medicines only as told by your doctor.  Drink enough fluids to keep your pee (urine) clear or pale yellow.  Rest when you feel you need to.  To help with pain, try:  Sipping warm liquids, such as broth, herbal tea, or warm water.  Eating or drinking cold or frozen liquids, such as frozen ice pops.  Gargling with a salt-water mixture 3-4 times a day or as needed. To make a salt-water mixture, add -1 tsp of salt in 1 cup of warm water. Mix it until you cannot see the salt anymore.  Sucking on hard candy or throat lozenges.  Putting a cool-mist humidifier in your bedroom at night.  Sitting in the bathroom with the door closed for 5-10 minutes while you run hot water in the shower.  Do not use any tobacco products, such as cigarettes, chewing tobacco, and e-cigarettes. If you need help quitting, ask your doctor. Contact a doctor if:  You have a fever for more than 2-3 days.  You keep having symptoms for more than 2-3 days.  Your throat does not get better in 7 days.  You have a fever and your symptoms suddenly get worse. Get help right away if:  You have trouble breathing.  You cannot swallow fluids, soft foods, or your saliva.  You have swelling in your throat or neck that gets worse.  You keep feeling like you are going to throw up (vomit).  You keep throwing up. This information is not intended to replace advice given to you by your health care provider. Make sure  you discuss any questions you have with your health care provider. Document Released: 10/31/2007 Document Revised: 09/17/2015 Document Reviewed: 11/11/2014 Elsevier Interactive Patient Education  2017 [employer]  

## 2016-07-14 ENCOUNTER — Other Ambulatory Visit: Payer: Self-pay | Admitting: Internal Medicine

## 2016-08-28 ENCOUNTER — Other Ambulatory Visit: Payer: Self-pay | Admitting: Internal Medicine

## 2016-08-29 ENCOUNTER — Other Ambulatory Visit: Payer: Self-pay | Admitting: Internal Medicine

## 2016-08-29 DIAGNOSIS — Z1231 Encounter for screening mammogram for malignant neoplasm of breast: Secondary | ICD-10-CM

## 2016-09-03 ENCOUNTER — Ambulatory Visit: Payer: Medicare Other | Admitting: Internal Medicine

## 2016-09-16 ENCOUNTER — Ambulatory Visit (INDEPENDENT_AMBULATORY_CARE_PROVIDER_SITE_OTHER): Payer: Medicare Other | Admitting: Internal Medicine

## 2016-09-16 ENCOUNTER — Encounter: Payer: Self-pay | Admitting: Internal Medicine

## 2016-09-16 ENCOUNTER — Ambulatory Visit (INDEPENDENT_AMBULATORY_CARE_PROVIDER_SITE_OTHER)
Admission: RE | Admit: 2016-09-16 | Discharge: 2016-09-16 | Disposition: A | Discharge status: Home / Self Care | Payer: Medicare Other | Source: Ambulatory Visit | Attending: Internal Medicine | Admitting: Internal Medicine

## 2016-09-16 VITALS — BP 136/82 | HR 57 | Temp 98.0°F | Ht 60.0 in | Wt 171.0 lb

## 2016-09-16 DIAGNOSIS — M5412 Radiculopathy, cervical region: Secondary | ICD-10-CM | POA: Diagnosis not present

## 2016-09-16 DIAGNOSIS — M50323 Other cervical disc degeneration at C6-C7 level: Secondary | ICD-10-CM | POA: Diagnosis not present

## 2016-09-16 DIAGNOSIS — J4520 Mild intermittent asthma, uncomplicated: Secondary | ICD-10-CM

## 2016-09-16 DIAGNOSIS — I10 Essential (primary) hypertension: Secondary | ICD-10-CM

## 2016-09-16 DIAGNOSIS — Z0000 Encounter for general adult medical examination without abnormal findings: Secondary | ICD-10-CM | POA: Diagnosis not present

## 2016-09-16 DIAGNOSIS — R1013 Epigastric pain: Secondary | ICD-10-CM | POA: Diagnosis not present

## 2016-09-16 DIAGNOSIS — M1990 Unspecified osteoarthritis, unspecified site: Secondary | ICD-10-CM | POA: Diagnosis not present

## 2016-09-16 DIAGNOSIS — E7800 Pure hypercholesterolemia, unspecified: Secondary | ICD-10-CM

## 2016-09-16 LAB — CBC
HCT: 38.8 % (ref 36.0–46.0)
Hemoglobin: 12.6 g/dL (ref 12.0–15.0)
MCHC: 32.4 g/dL (ref 30.0–36.0)
MCV: 93.4 fl (ref 78.0–100.0)
Platelets: 235 10*3/uL (ref 150.0–400.0)
RBC: 4.15 Mil/uL (ref 3.87–5.11)
RDW: 13.9 % (ref 11.5–15.5)
WBC: 7.5 10*3/uL (ref 4.0–10.5)

## 2016-09-16 LAB — COMPREHENSIVE METABOLIC PANEL
ALBUMIN: 4.3 g/dL (ref 3.5–5.2)
ALK PHOS: 53 U/L (ref 39–117)
ALT: 15 U/L (ref 0–35)
AST: 17 U/L (ref 0–37)
BUN: 15 mg/dL (ref 6–23)
CO2: 27 mEq/L (ref 19–32)
CREATININE: 0.59 mg/dL (ref 0.40–1.20)
Calcium: 9.3 mg/dL (ref 8.4–10.5)
Chloride: 98 mEq/L (ref 96–112)
GFR: 104.92 mL/min (ref >60.00)
Glucose, Bld: 94 mg/dL (ref 70–99)
Potassium: 4.5 mEq/L (ref 3.5–5.1)
SODIUM: 132 meq/L — AB (ref 135–145)
Total Bilirubin: 0.4 mg/dL (ref 0.2–1.2)
Total Protein: 6.4 g/dL (ref 6.0–8.3)

## 2016-09-16 LAB — LIPID PANEL
CHOLESTEROL: 188 mg/dL (ref 0–200)
HDL: 70.6 mg/dL (ref >39.00)
LDL CALC: 106 mg/dL — AB (ref 0–99)
NonHDL: 117.4
TRIGLYCERIDES: 55 mg/dL (ref 0.0–149.0)
Total CHOL/HDL Ratio: 3
VLDL: 11 mg/dL (ref 0.0–40.0)

## 2016-09-16 IMAGING — DX DG CERVICAL SPINE COMPLETE 4+V
6 series · 6 of 6 positions shown · non-contrast
Comparison: None.

CLINICAL DATA: Bilateral cervical radiculitis.

EXAM:
CERVICAL SPINE - COMPLETE 4+ VIEW

[c-spine lat]
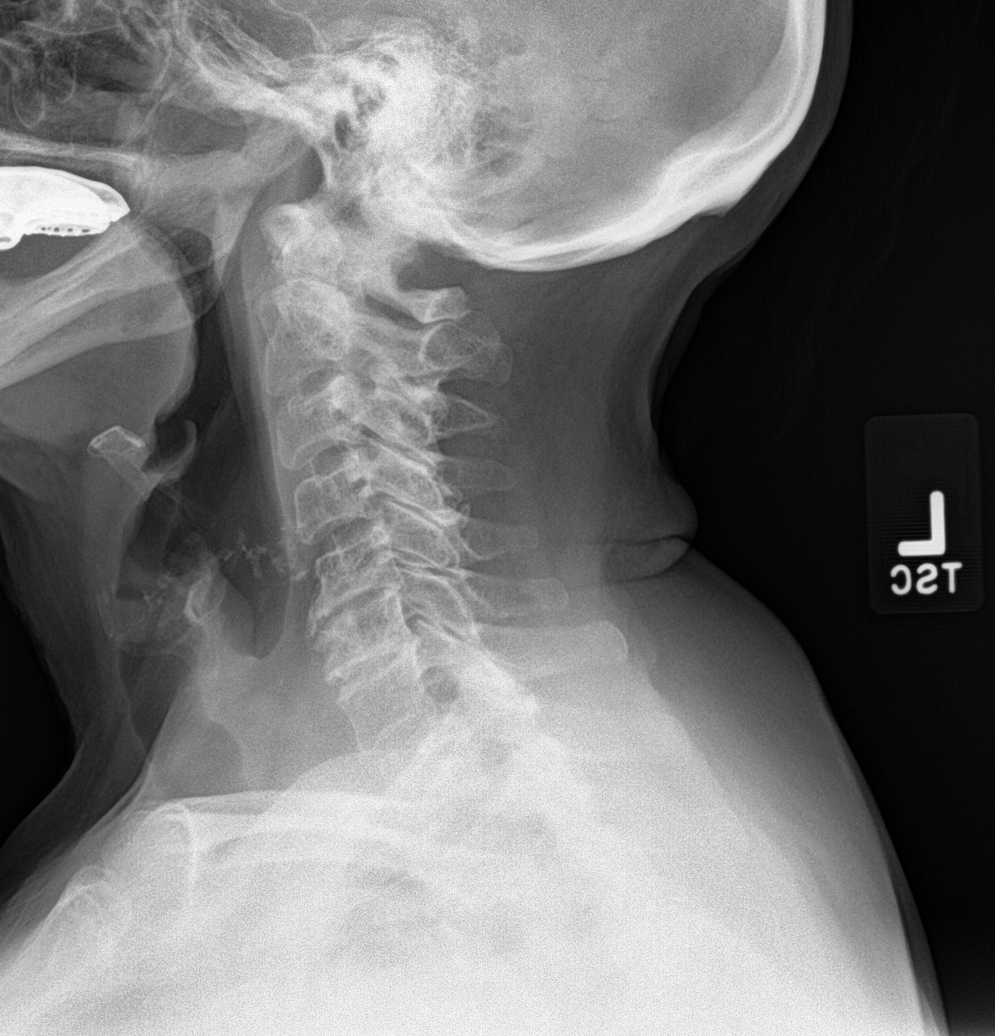

[c-spine obl (1 of 2)]
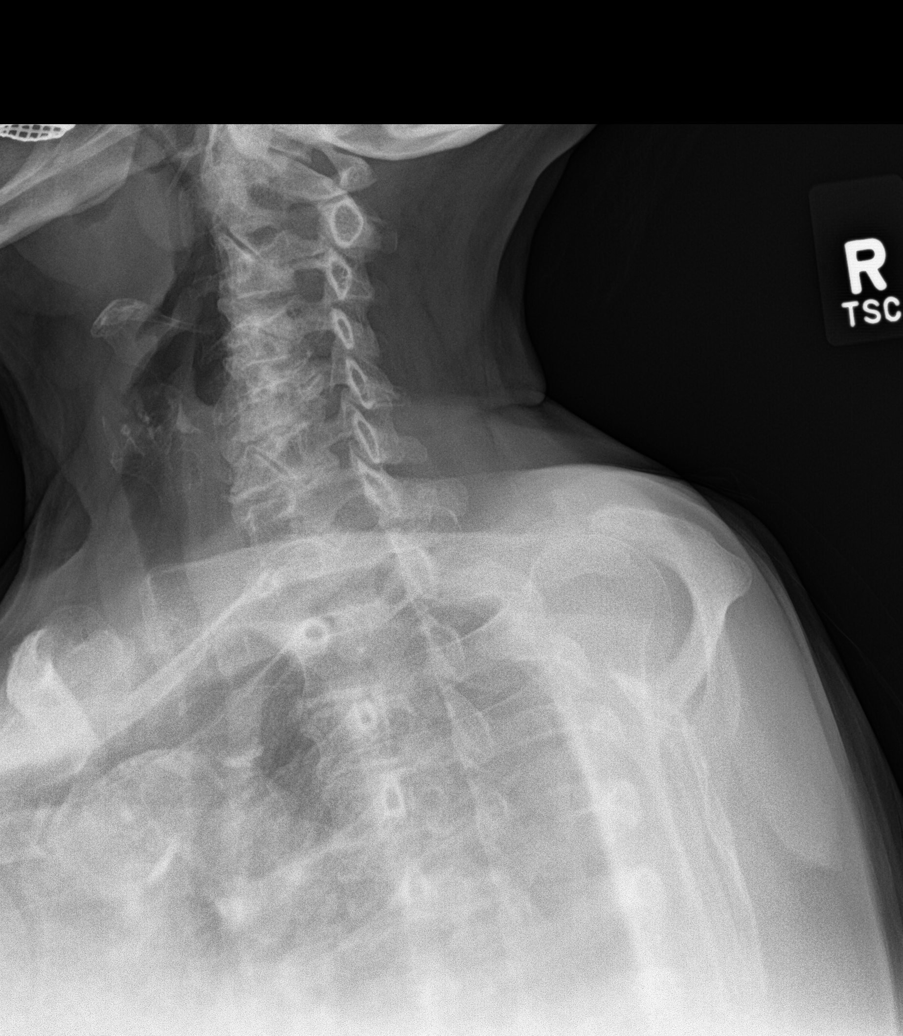

[c-spine obl (2 of 2)]
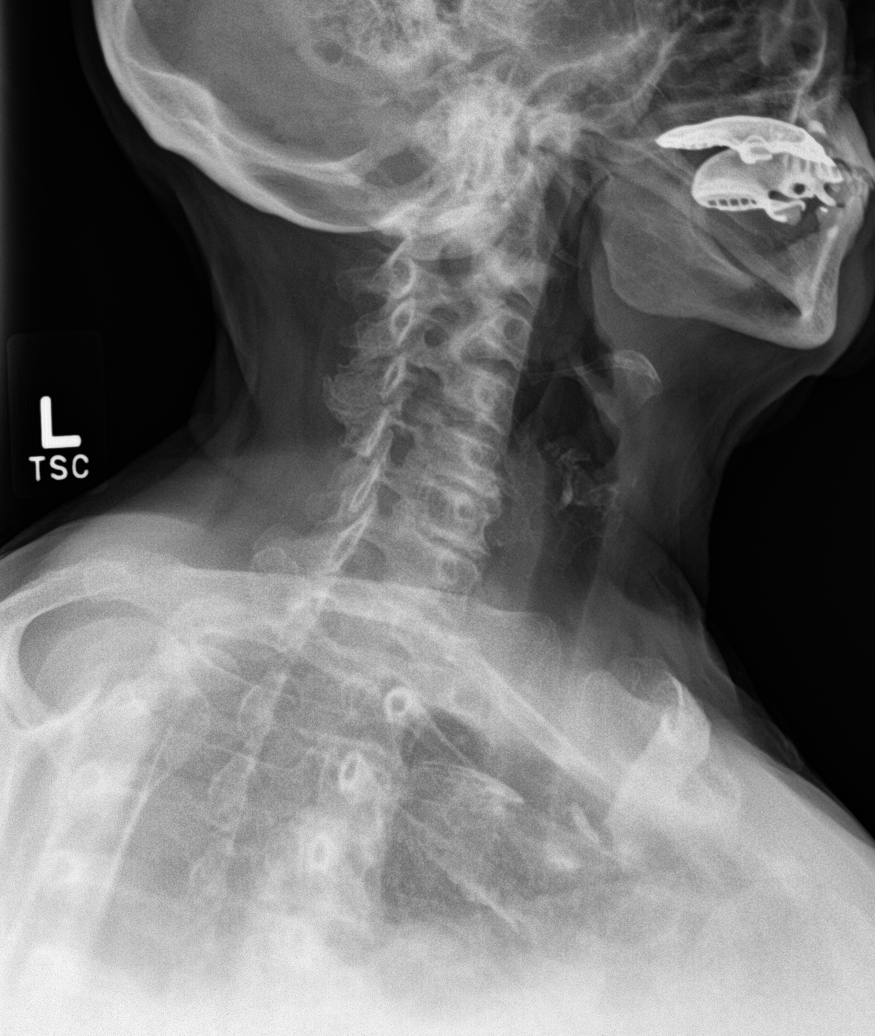

[c-spine ap]
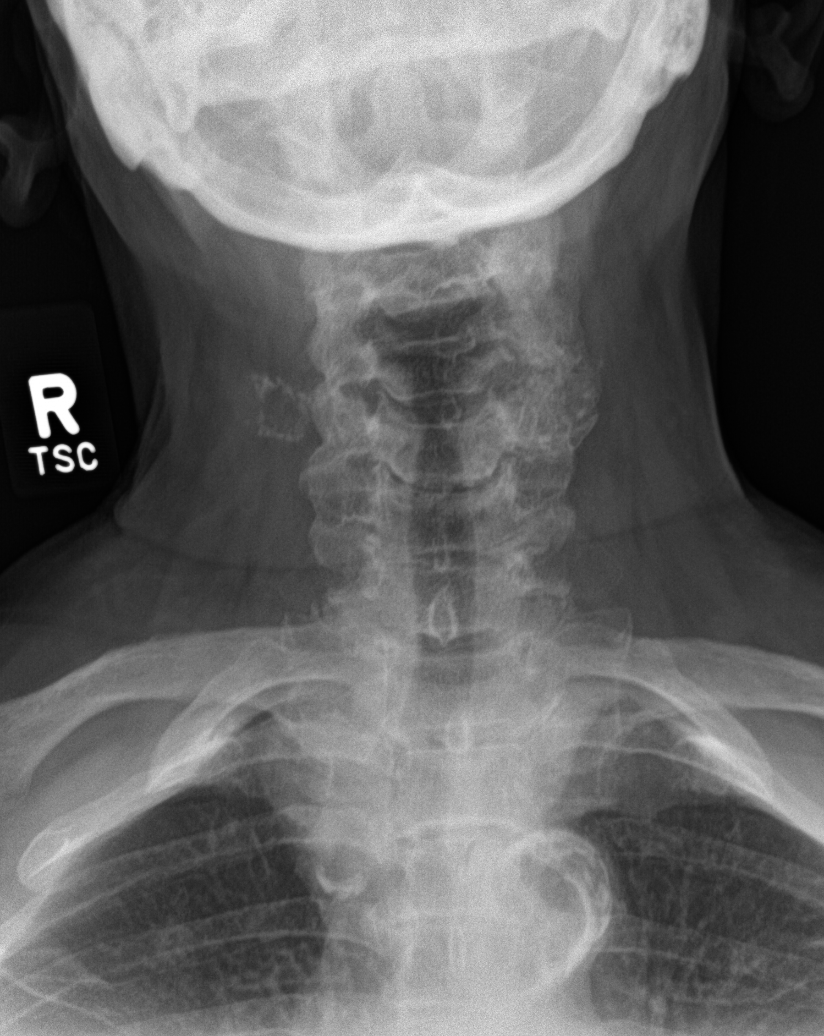

[c-spine open mouth]
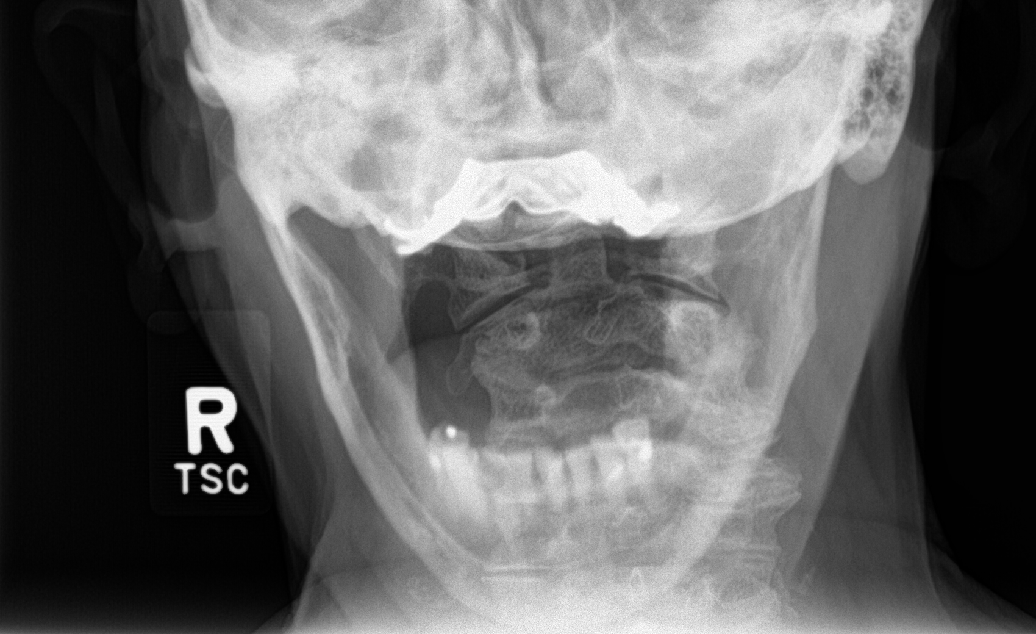

[c-spine swimmers]
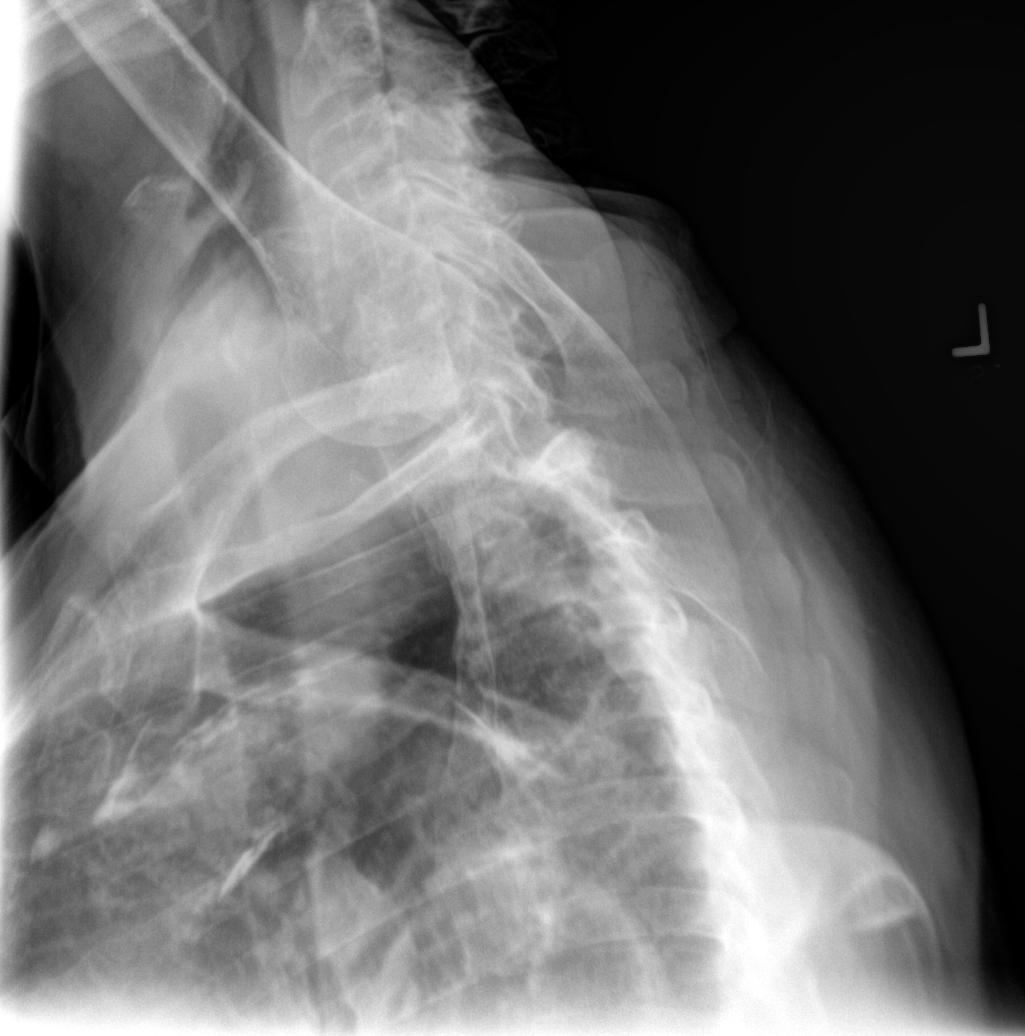

[6 of 6 positions shown; findings below may reference images not displayed]

FINDINGS: Mild grade 1 retrolisthesis of C5-6 is noted secondary to moderate
degenerative disc disease at this level with anterior osteophyte
formation. Moderate degenerative disc disease is also noted at C6-7.
No prevertebral soft tissue swelling is noted. Mild bilateral neural
foraminal stenosis is noted at C5-6 and C6-7 with uncovertebral
spurring. Hypertrophy of left-sided posterior facet joints is noted.
IMPRESSION: Moderate degenerative disc disease is noted at C5-6 and C6-7 with
bilateral neural foraminal stenosis at these levels secondary to
uncovertebral spurring. No acute abnormality seen in the cervical
spine.

## 2016-09-16 NOTE — Patient Instructions (Signed)
Health Maintenance for Postmenopausal Women Menopause is a normal process in which your reproductive ability comes to an end. This process happens gradually over a span of months to years, usually between the ages of 22 and 9. Menopause is complete when you have missed 12 consecutive menstrual periods. It is important to talk with your health care provider about some of the most common conditions that affect postmenopausal women, such as heart disease, cancer, and bone loss (osteoporosis). Adopting a healthy lifestyle and getting preventive care can help to promote your health and wellness. Those actions can also lower your chances of developing some of these common conditions. What should I know about menopause? During menopause, you may experience a number of symptoms, such as:  Moderate-to-severe hot flashes.  Night sweats.  Decrease in sex drive.  Mood swings.  Headaches.  Tiredness.  Irritability.  Memory problems.  Insomnia.  Choosing to treat or not to treat menopausal changes is an individual decision that you make with your health care provider. What should I know about hormone replacement therapy and supplements? Hormone therapy products are effective for treating symptoms that are associated with menopause, such as hot flashes and night sweats. Hormone replacement carries certain risks, especially as you become older. If you are thinking about using estrogen or estrogen with progestin treatments, discuss the benefits and risks with your health care provider. What should I know about heart disease and stroke? Heart disease, heart attack, and stroke become more likely as you age. This may be due, in part, to the hormonal changes that your body experiences during menopause. These can affect how your body processes dietary fats, triglycerides, and cholesterol. Heart attack and stroke are both medical emergencies. There are many things that you can do to help prevent heart disease  and stroke:  Have your blood pressure checked at least every 1-2 years. High blood pressure causes heart disease and increases the risk of stroke.  If you are 53-22 years old, ask your health care provider if you should take aspirin to prevent a heart attack or a stroke.  Do not use any tobacco products, including cigarettes, chewing tobacco, or electronic cigarettes. If you need help quitting, ask your health care provider.  It is important to eat a healthy diet and maintain a healthy weight. ? Be sure to include plenty of vegetables, fruits, low-fat dairy products, and lean protein. ? Avoid eating foods that are high in solid fats, added sugars, or salt (sodium).  Get regular exercise. This is one of the most important things that you can do for your health. ? Try to exercise for at least 150 minutes each week. The type of exercise that you do should increase your heart rate and make you sweat. This is known as moderate-intensity exercise. ? Try to do strengthening exercises at least twice each week. Do these in addition to the moderate-intensity exercise.  Know your numbers.Ask your health care provider to check your cholesterol and your blood glucose. Continue to have your blood tested as directed by your health care provider.  What should I know about cancer screening? There are several types of cancer. Take the following steps to reduce your risk and to catch any cancer development as early as possible. Breast Cancer  Practice breast self-awareness. ? This means understanding how your breasts normally appear and feel. ? It also means doing regular breast self-exams. Let your health care provider know about any changes, no matter how small.  If you are 40  or older, have a clinician do a breast exam (clinical breast exam or CBE) every year. Depending on your age, family history, and medical history, it may be recommended that you also have a yearly breast X-ray (mammogram).  If you  have a family history of breast cancer, talk with your health care provider about genetic screening.  If you are at high risk for breast cancer, talk with your health care provider about having an MRI and a mammogram every year.  Breast cancer (BRCA) gene test is recommended for women who have family members with BRCA-related cancers. Results of the assessment will determine the need for genetic counseling and BRCA1 and for BRCA2 testing. BRCA-related cancers include these types: ? Breast. This occurs in males or females. ? Ovarian. ? Tubal. This may also be called fallopian tube cancer. ? Cancer of the abdominal or pelvic lining (peritoneal cancer). ? Prostate. ? Pancreatic.  Cervical, Uterine, and Ovarian Cancer Your health care provider may recommend that you be screened regularly for cancer of the pelvic organs. These include your ovaries, uterus, and vagina. This screening involves a pelvic exam, which includes checking for microscopic changes to the surface of your cervix (Pap test).  For women ages 21-65, health care providers may recommend a pelvic exam and a Pap test every three years. For women ages 79-65, they may recommend the Pap test and pelvic exam, combined with testing for human papilloma virus (HPV), every five years. Some types of HPV increase your risk of cervical cancer. Testing for HPV may also be done on women of any age who have unclear Pap test results.  Other health care providers may not recommend any screening for nonpregnant women who are considered low risk for pelvic cancer and have no symptoms. Ask your health care provider if a screening pelvic exam is right for you.  If you have had past treatment for cervical cancer or a condition that could lead to cancer, you need Pap tests and screening for cancer for at least 20 years after your treatment. If Pap tests have been discontinued for you, your risk factors (such as having a new sexual partner) need to be  reassessed to determine if you should start having screenings again. Some women have medical problems that increase the chance of getting cervical cancer. In these cases, your health care provider may recommend that you have screening and Pap tests more often.  If you have a family history of uterine cancer or ovarian cancer, talk with your health care provider about genetic screening.  If you have vaginal bleeding after reaching menopause, tell your health care provider.  There are currently no reliable tests available to screen for ovarian cancer.  Lung Cancer Lung cancer screening is recommended for adults 69-62 years old who are at high risk for lung cancer because of a history of smoking. A yearly low-dose CT scan of the lungs is recommended if you:  Currently smoke.  Have a history of at least 30 pack-years of smoking and you currently smoke or have quit within the past 15 years. A pack-year is smoking an average of one pack of cigarettes per day for one year.  Yearly screening should:  Continue until it has been 15 years since you quit.  Stop if you develop a health problem that would prevent you from having lung cancer treatment.  Colorectal Cancer  This type of cancer can be detected and can often be prevented.  Routine colorectal cancer screening usually begins at  age 42 and continues through age 45.  If you have risk factors for colon cancer, your health care provider may recommend that you be screened at an earlier age.  If you have a family history of colorectal cancer, talk with your health care provider about genetic screening.  Your health care provider may also recommend using home test kits to check for hidden blood in your stool.  A small camera at the end of a tube can be used to examine your colon directly (sigmoidoscopy or colonoscopy). This is done to check for the earliest forms of colorectal cancer.  Direct examination of the colon should be repeated every  5-10 years until age 71. However, if early forms of precancerous polyps or small growths are found or if you have a family history or genetic risk for colorectal cancer, you may need to be screened more often.  Skin Cancer  Check your skin from head to toe regularly.  Monitor any moles. Be sure to tell your health care provider: ? About any new moles or changes in moles, especially if there is a change in a mole's shape or color. ? If you have a mole that is larger than the size of a pencil eraser.  If any of your family members has a history of skin cancer, especially at a young age, talk with your health care provider about genetic screening.  Always use sunscreen. Apply sunscreen liberally and repeatedly throughout the day.  Whenever you are outside, protect yourself by wearing long sleeves, pants, a wide-brimmed hat, and sunglasses.  What should I know about osteoporosis? Osteoporosis is a condition in which bone destruction happens more quickly than new bone creation. After menopause, you may be at an increased risk for osteoporosis. To help prevent osteoporosis or the bone fractures that can happen because of osteoporosis, the following is recommended:  If you are 46-71 years old, get at least 1,000 mg of calcium and at least 600 mg of vitamin D per day.  If you are older than age 55 but younger than age 65, get at least 1,200 mg of calcium and at least 600 mg of vitamin D per day.  If you are older than age 54, get at least 1,200 mg of calcium and at least 800 mg of vitamin D per day.  Smoking and excessive alcohol intake increase the risk of osteoporosis. Eat foods that are rich in calcium and vitamin D, and do weight-bearing exercises several times each week as directed by your health care provider. What should I know about how menopause affects my mental health? Depression may occur at any age, but it is more common as you become older. Common symptoms of depression  include:  Low or sad mood.  Changes in sleep patterns.  Changes in appetite or eating patterns.  Feeling an overall lack of motivation or enjoyment of activities that you previously enjoyed.  Frequent crying spells.  Talk with your health care provider if you think that you are experiencing depression. What should I know about immunizations? It is important that you get and maintain your immunizations. These include:  Tetanus, diphtheria, and pertussis (Tdap) booster vaccine.  Influenza every year before the flu season begins.  Pneumonia vaccine.  Shingles vaccine.  Your health care provider may also recommend other immunizations. This information is not intended to replace advice given to you by your health care provider. Make sure you discuss any questions you have with your health care provider. Document Released: 03/15/2005  Document Revised: 08/11/2015 Document Reviewed: 10/25/2014 Elsevier Interactive Patient Education  2018 [employer]  

## 2016-09-16 NOTE — Assessment & Plan Note (Signed)
Controlled on Advair prn Albuterol inhaler refilled today

## 2016-09-16 NOTE — Progress Notes (Signed)
HPI:  Pt presents to the clinic today for her Medicare Wellness Exam. She is also due to follow up chronic conditions.  HTN: Her BP today is 136/82. She is taking Losartan and Metoprolol as prescribed. ECG from reviewed.  HLD: Her last LDL was 136, 08/2015. She is taking Fish Oil daily as prescribed. She tries to consume a low fat diet.  Arthritis: Mainly in her back. She takes Ibuprofen as needed with good relief.  Asthma: She is a former smoker. She denies SOB. She uses Advair as needed. She needs a refill on the Albuterol inhaler.  Past Medical History:  Diagnosis Date  . Arthritis   . Asthma   . Chicken pox   . Hyperlipidemia   . Hypertension     Current Outpatient Prescriptions  Medication Sig Dispense Refill  . ADVAIR DISKUS 250-50 MCG/DOSE AEPB INHALE 1 PUFF INTO THE LUNGS 2 (TWO) TIMES DAILY. 60 each 1  . Cholecalciferol (VITAMIN D3) 2000 UNITS TABS Take 1 tablet by mouth daily.    Marland Kitchen ibuprofen (ADVIL,MOTRIN) 400 MG tablet Take 400 mg by mouth daily as needed.     Marland Kitchen losartan (COZAAR) 50 MG tablet TAKE 1 TABLET (50 MG TOTAL) BY MOUTH DAILY. 90 tablet 0  . metoprolol succinate (TOPROL-XL) 25 MG 24 hr tablet Take 1 tablet (25 mg total) by mouth daily. 90 tablet 1  . Multiple Minerals-Vitamins (CALCIUM CITRATE PLUS PO) Take 1 tablet by mouth daily.    . Omega-3 Fatty Acids (FISH OIL) 1000 MG CAPS Take 1 capsule by mouth daily.    . vitamin C (ASCORBIC ACID) 500 MG tablet Take 1,000 mg by mouth daily.      No current facility-administered medications for this visit.     Allergies  Allergen Reactions  . Ace Inhibitors     Other reaction(s): Cough  . Codeine Nausea Only    Family History  Problem Relation Age of Onset  . Heart disease Mother   . Heart disease Father   . Arthritis Maternal Grandmother   . Diabetes Neg Hx   . Cancer Neg Hx   . Stroke Neg Hx     Social History   Social History  . Marital status: Widowed    Spouse name: N/A  . Number of children:  N/A  . Years of education: N/A   Occupational History  . Not on file.   Social History Main Topics  . Smoking status: Former Smoker    Quit date: 04/17/2009  . Smokeless tobacco: Never Used  . Alcohol use 0.0 oz/week     Comment: occasional  . Drug use: No  . Sexual activity: No   Other Topics Concern  . Not on file   Social History Narrative  . No narrative on file    Hospitiliaztions: none  Health Maintenance:    Flu: 11/2015  Tetanus: unsure  Pneumovax: 10/2009  Prevnar: 06/2014  Zostavax: 11/2009  Mammogram: 09/2015  Pap Smear: no longer screening  Bone Density: 09/2015  Colon Screening: 09/2005  Eye Doctor: annually  Dental Exam: annually   Providers:   PCP: Webb Silversmith, NP-C    I have personally reviewed and have noted:  1. [name] medical and social history 2. Their use of alcohol, tobacco or illicit drugs 3. Their current medications and supplements 4. [name] functional ability including ADL's, fall risks, home safety risks and hearing or visual impairment. 5. Diet and physical activities 6. Evidence for depression or mood disorder  Subjective:  Review of Systems:   Constitutional: Denies fever, malaise, fatigue, headache or abrupt weight changes.  HEENT: Denies eye pain, eye redness, ear pain, ringing in the ears, wax buildup, runny nose, nasal congestion, bloody nose, or sore throat. Respiratory: Denies difficulty breathing, shortness of breath, cough or sputum production.   Cardiovascular: Pt reports intermittent leg swelling. Denies chest pain, chest tightness, palpitations or swelling in the hands.  Gastrointestinal: Pt reports epigastric pain. Denies bloating, constipation, diarrhea or blood in the stool.  GU: Denies urgency, frequency, pain with urination, burning sensation, blood in urine, odor or discharge. Musculoskeletal: Pt reports neck stiffness. Denies decrease in range of motion, difficulty with gait, muscle pain or joint  pain and swelling.  Skin: Denies redness, rashes, lesions or ulcercations.  Neurological: Pt reports weakness in BUE. Denies dizziness, difficulty with memory, difficulty with speech or problems with balance and coordination.  Psych: Denies anxiety, depression, SI/HI.  No other specific complaints in a complete review of systems (except as listed in HPI above).  Objective:  PE:   BP 136/82   Pulse (!) 57   Temp 98 F (36.7 C) (Oral)   Ht 5' (1.524 m)   Wt 171 lb (77.6 kg)   SpO2 99%   BMI 33.40 kg/m   Wt Readings from Last 3 Encounters:  03/11/16 171 lb (77.6 kg)  08/28/15 166 lb (75.3 kg)  07/04/15 166 lb (75.3 kg)    General: Appears her stated age,  in NAD. Skin: Warm, dry and intact.  HEENT: Head: normal shape and size; Eyes: sclera white, no icterus, conjunctiva pink, PERRLA and EOMs intact; Ears: Tm's gray and intact, normal light reflex; Throat/Mouth: Teeth present, mucosa pink and moist, no exudate, lesions or ulcerations noted.  Neck: Neck supple, trachea midline. No masses, lumps present.  Cardiovascular: Normal rate and rhythm. S1,S2 noted.  No murmur, rubs or gallops noted. No JVD or BLE edema. No carotid bruits noted. Pulmonary/Chest: Normal effort and positive vesicular breath sounds. No respiratory distress. No wheezes, rales or ronchi noted.  Abdomen: Soft and nontender. Normal bowel sounds. No distention or masses noted. Liver, spleen and kidneys non palpable. Musculoskeletal:  Strength 5/5 BUE/BLE. No signs of joint swelling.  Neurological: Alert and oriented. Cranial nerves II-XII grossly intact. Coordination normal.  Psychiatric: Mood and affect normal. Behavior is normal. Judgment and thought content normal.     BMET    Component Value Date/Time   NA 132 (L) 08/28/2015 1510   K 4.4 08/28/2015 1510   CL 96 08/28/2015 1510   CO2 31 08/28/2015 1510   GLUCOSE 90 08/28/2015 1510   BUN 16 08/28/2015 1510   CREATININE 0.65 08/28/2015 1510   CALCIUM 9.7  08/28/2015 1510    Lipid Panel     Component Value Date/Time   CHOL 221 (H) 08/28/2015 1510   TRIG 91.0 08/28/2015 1510   HDL 66.70 08/28/2015 1510   CHOLHDL 3 08/28/2015 1510   VLDL 18.2 08/28/2015 1510   LDLCALC 136 (H) 08/28/2015 1510    CBC    Component Value Date/Time   WBC 9.4 08/28/2015 1510   RBC 4.26 08/28/2015 1510   HGB 12.7 08/28/2015 1510   HCT 38.4 08/28/2015 1510   PLT 286.0 08/28/2015 1510   MCV 90.1 08/28/2015 1510   MCHC 33.1 08/28/2015 1510   RDW 14.7 08/28/2015 1510    Hgb A1C No results found for: HGBA1C    Assessment and Plan:   Medicare Annual Wellness Visit:  Diet: She does eat  mat. She consumes fruits and veggies daily. She occasionally eats fried foods. She drinks mostly unsweet tea. Physical activity: Sedentary Depression/mood screen: Negative Hearing: Intact to whispered voice Visual acuity: Grossly normal, performs annual eye exam  ADLs: Capable Fall risk: None Home safety: Good Cognitive evaluation: Intact to orientation, naming, recall and repetition EOL planning: No adv directives, full code/ I agree  Preventative Medicine: Encouraged her to get a flu shot in the fall. She declines tetanus due to financial reasons. Pneumovax, prevnar and zostovax UTD. Mammogram andbone density UTD. She no longer wishes to screen for colon cancer or cervical cancer. Encouraged her to consume a balanced diet and exercise regimen. Advised her to see an eye doctor and dentist annually. Will check CBC, CMET, Lipid and Vit D today.  Epigastric Pain:  Likely GERD ECG today normal  Cervical Radiculitis:  Xray cervical spine today  Next appointment: 1 year, Medicare Wellness Exam   Webb Silversmith, NP

## 2016-09-16 NOTE — Assessment & Plan Note (Signed)
CMET and Lipid profile today Encouraged her to consume a low fat diet Continue Fish Oil for now 

## 2016-09-16 NOTE — Assessment & Plan Note (Signed)
Continue Ibuprofen prn

## 2016-09-16 NOTE — Assessment & Plan Note (Signed)
Controlled on Losartan and Metoprolol CMET today Will monitor

## 2016-09-18 ENCOUNTER — Other Ambulatory Visit: Payer: Self-pay | Admitting: Internal Medicine

## 2016-09-18 ENCOUNTER — Telehealth: Payer: Self-pay | Admitting: Internal Medicine

## 2016-09-18 DIAGNOSIS — M5412 Radiculopathy, cervical region: Secondary | ICD-10-CM

## 2016-09-18 NOTE — Telephone Encounter (Signed)
Caller Name:Lashunta Hruska  Relationship to Patient:self  Best number:[phone] Pharmacy:  Reason for call:  Returning your call, about labs

## 2016-10-11 ENCOUNTER — Ambulatory Visit
Admission: RE | Admit: 2016-10-11 | Discharge: 2016-10-11 | Disposition: A | Discharge status: Home / Self Care | Payer: Medicare Other | Source: Ambulatory Visit | Attending: Internal Medicine | Admitting: Internal Medicine

## 2016-10-11 DIAGNOSIS — Z1231 Encounter for screening mammogram for malignant neoplasm of breast: Secondary | ICD-10-CM

## 2016-10-11 IMAGING — MG 2D DIGITAL SCREENING BILATERAL MAMMOGRAM WITH CAD AND ADJUNCT TO
8 of 15 series · 8 of 31 positions shown · non-contrast
Comparison: Previous exam(s).

CLINICAL DATA: Screening.

EXAM:
2D DIGITAL SCREENING BILATERAL MAMMOGRAM WITH CAD AND ADJUNCT TOMO

[R CC]
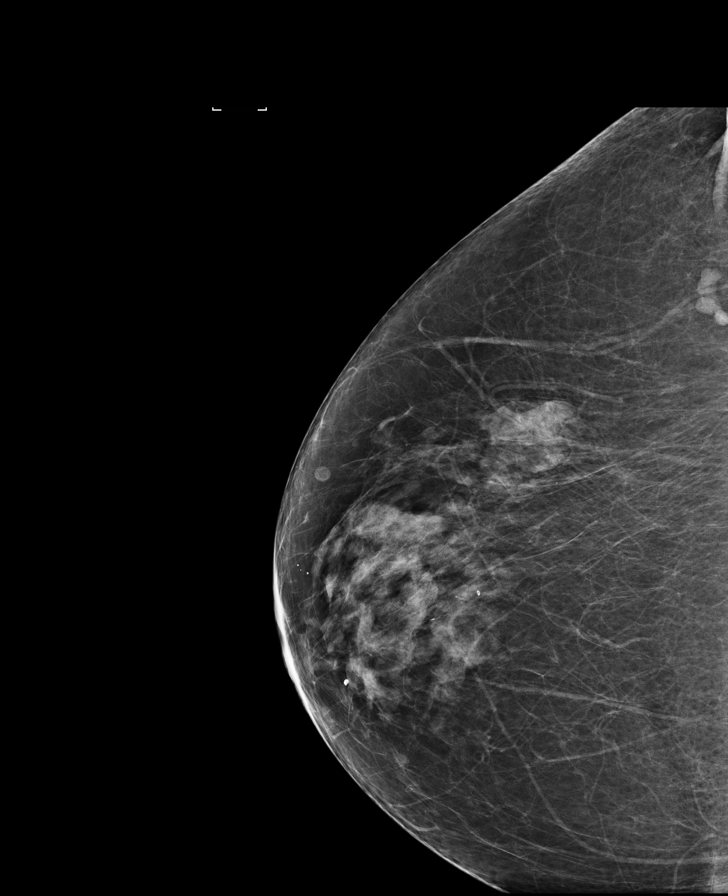

[L CC (1 of 2)]
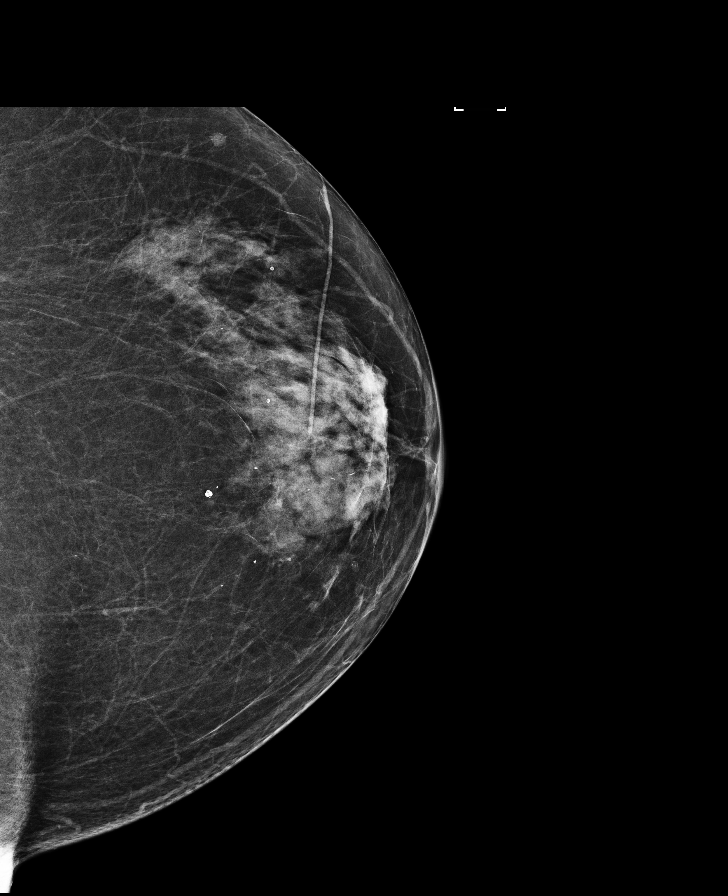

[L MLO (1 of 2)]
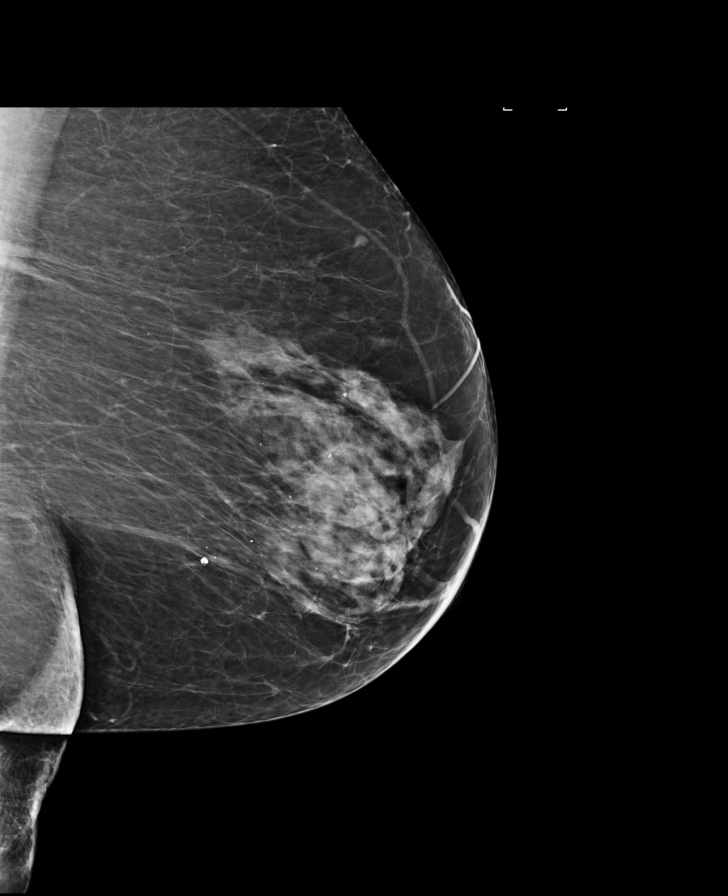

[R MLO synth-2D]
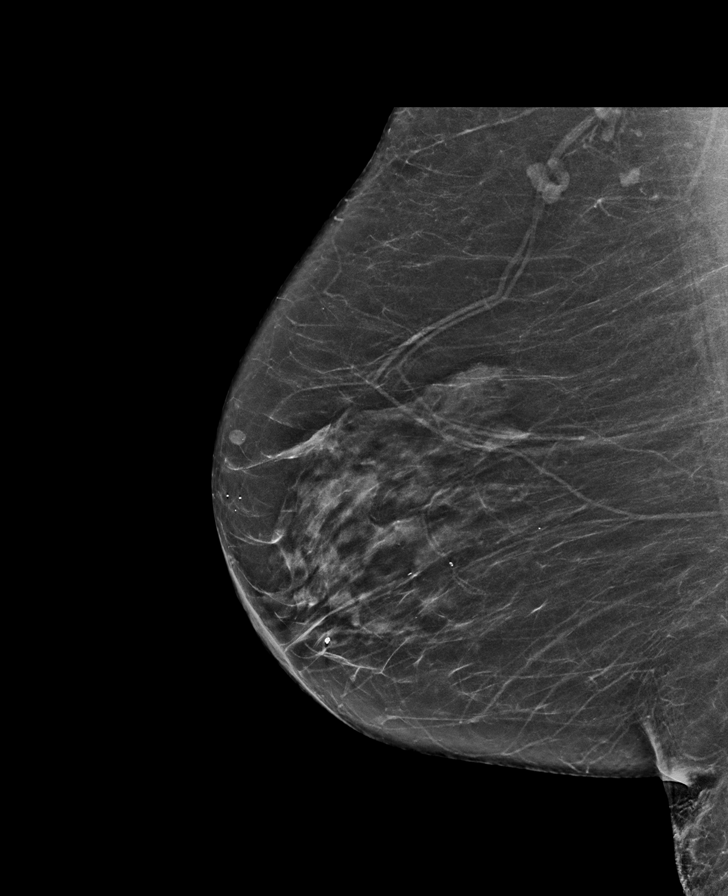

[R MLO]
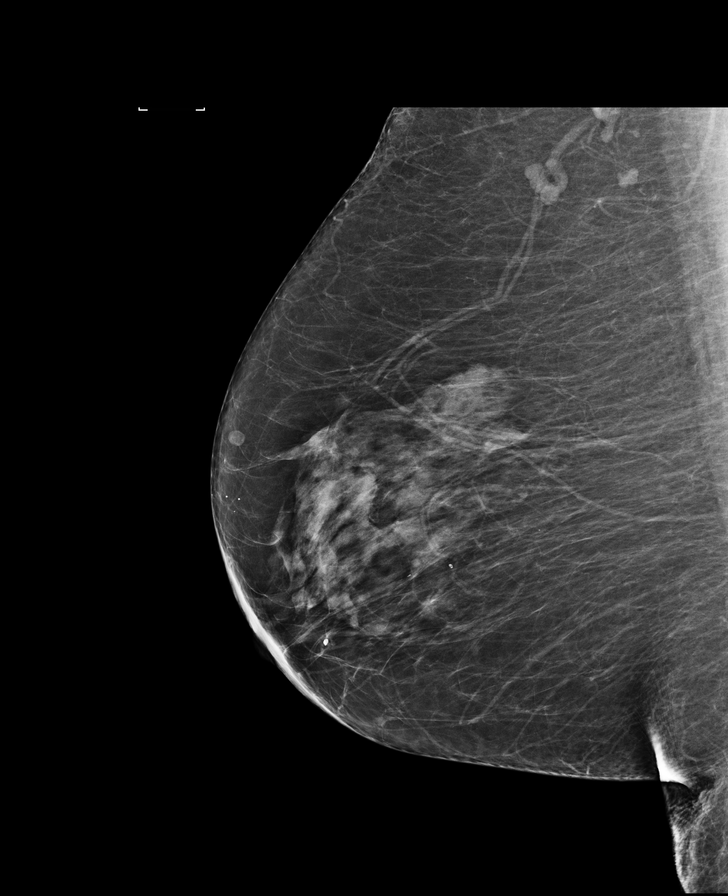

[L MLO synth-2D]
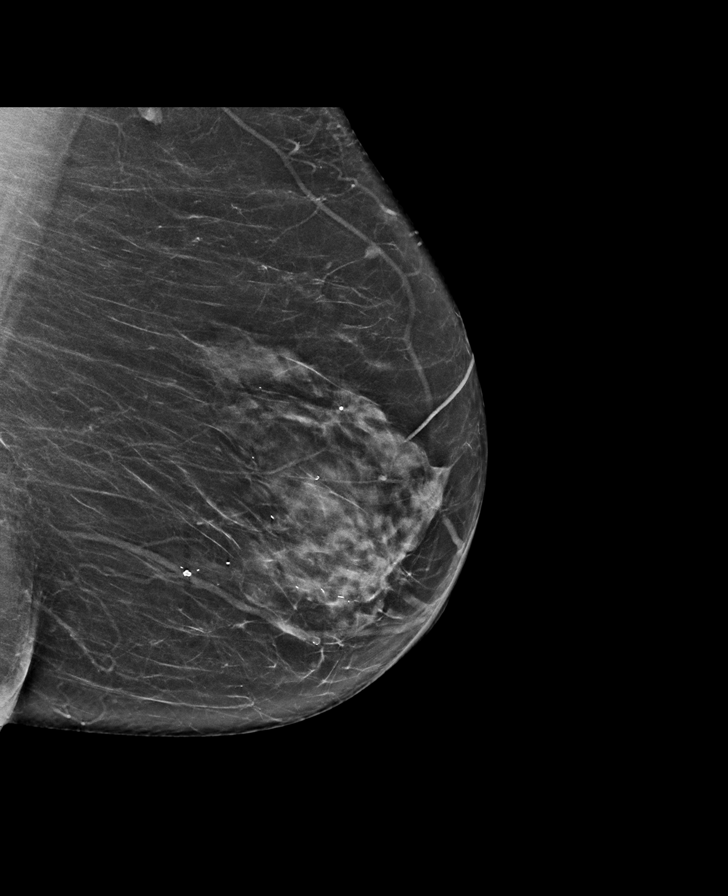

[L MLO (2 of 2)]
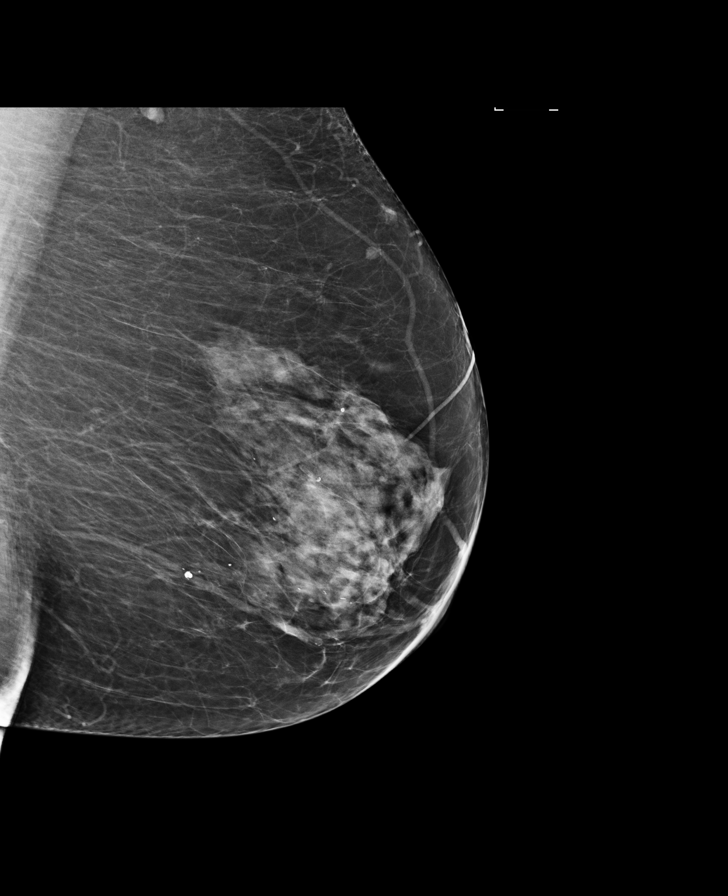

[L CC (2 of 2)]
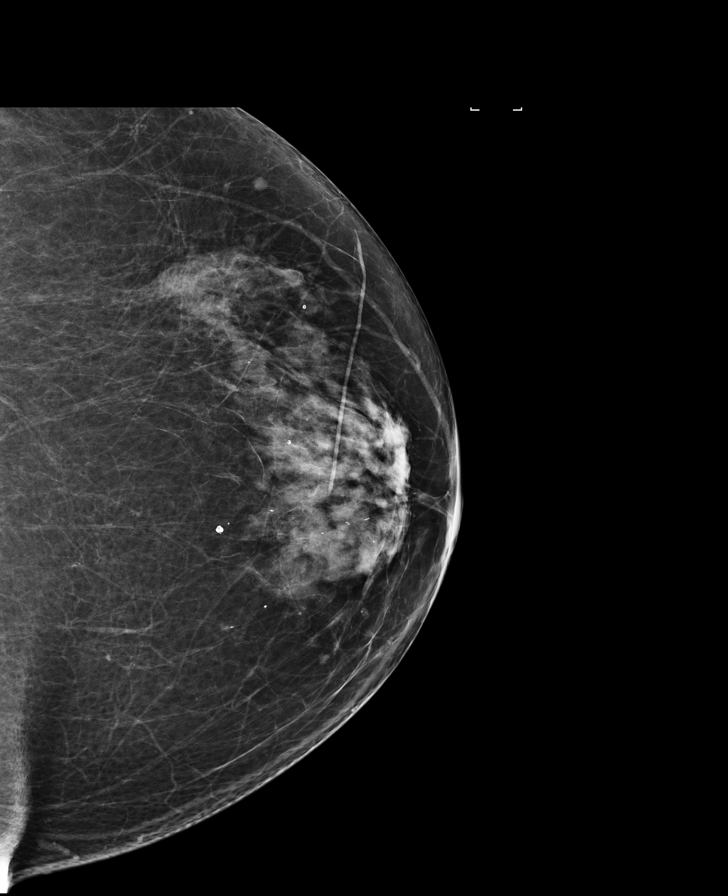

[8 of 31 positions shown; findings below may reference images not displayed]

ACR Breast Density Category c: The breast tissue is heterogeneously
dense, which may obscure small masses.
FINDINGS: There are no findings suspicious for malignancy. Images were
processed with CAD.
IMPRESSION: No mammographic evidence of malignancy. A result letter of this
screening mammogram will be mailed directly to the patient.

RECOMMENDATION:
Screening mammogram in one year. (Code:[TA])

BI-RADS CATEGORY  1: Negative.

## 2016-10-27 ENCOUNTER — Other Ambulatory Visit: Payer: Self-pay | Admitting: Internal Medicine

## 2016-11-01 ENCOUNTER — Other Ambulatory Visit: Payer: Self-pay

## 2016-11-01 MED ORDER — FLUTICASONE-SALMETEROL 250-50 MCG/DOSE IN AEPB: 1.0000 | INHALATION_SPRAY | Freq: Two times a day (BID) | RESPIRATORY_TRACT | 5 refills | Status: DC

## 2016-11-13 DIAGNOSIS — M4722 Other spondylosis with radiculopathy, cervical region: Secondary | ICD-10-CM | POA: Diagnosis not present

## 2016-11-18 DIAGNOSIS — H25813 Combined forms of age-related cataract, bilateral: Secondary | ICD-10-CM | POA: Diagnosis not present

## 2016-11-18 DIAGNOSIS — Z23 Encounter for immunization: Secondary | ICD-10-CM | POA: Diagnosis not present

## 2016-11-29 ENCOUNTER — Other Ambulatory Visit: Payer: Self-pay | Admitting: Internal Medicine

## 2016-12-09 ENCOUNTER — Encounter: Payer: Self-pay | Admitting: Physical Therapy

## 2016-12-09 ENCOUNTER — Ambulatory Visit: Payer: Medicare Other | Attending: Neurological Surgery | Admitting: Physical Therapy

## 2016-12-09 DIAGNOSIS — R293 Abnormal posture: Secondary | ICD-10-CM | POA: Insufficient documentation

## 2016-12-09 DIAGNOSIS — M542 Cervicalgia: Secondary | ICD-10-CM | POA: Diagnosis not present
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
## 2016-12-09 NOTE — Patient Instructions (Signed)
  Copyright  VHI. All rights reserved.  AROM, Rotation   Sit or stand, head comfortable, centered position. Turn head slowly to look over one shoulder. Hold _2__ seconds. Repeat to other side. Repeat _10__ times per session. Do _2-3__ sessions per day.  Copyright  VHI. All rights reserved.  Ear / Shoulder Stretch   Exhaling, move left ear toward left shoulder. Hold position for _2-3__sec. Inhaling, bring head back to center. Repeat to other side. Repeat sequence _10__ times. Do __2-3_ times per day.  Copyright  VHI. All rights reserved.  Extensors, Supine   Lie supine, head on small, rolled towel. Gently tuck chin and bring toward chest. Hold _5__ seconds. Repeat 10___ times per session. Do _2-3__ sessions per day.  Copyright  VHI. All rights reserved.  Flexors, Sitting / Standing   Stand or sit, head in comfortable, centered position. Draw chin in, pulling head straight back, keeping jaw and eyes level. Hold _5__ seconds. Repeat 10___ times per session. Do _2-3__ sessions per day.   Adduction (Active)   Maintaining erect posture, draw shoulders back while bringing elbows back and inward. Hold for 2-3 sec Repeat __10__ times. Do _2-3___ sessions per day.  Copyright  VHI. All rights reserved.  Tips for Range of Motion Set aside same time of day for exercise, so it becomes part of the daily routine. All hand holds should be gentle. Move slowly, cautiously and with good control. Once resistance is felt, push no further. Quality of stretch is more important than quantity.  Copyright  VHI. All rights reserved.

## 2016-12-16 ENCOUNTER — Ambulatory Visit: Payer: Medicare Other | Admitting: Physical Therapy

## 2016-12-16 ENCOUNTER — Encounter: Payer: Self-pay | Admitting: Physical Therapy

## 2016-12-16 DIAGNOSIS — R293 Abnormal posture: Secondary | ICD-10-CM

## 2016-12-16 DIAGNOSIS — M542 Cervicalgia: Secondary | ICD-10-CM

## 2016-12-16 NOTE — Patient Instructions (Addendum)
  Shoulder Retraction   Tie band around door knob (sitting or standing, holding band in both hands) Facing chest height anchor, grasp ends of band and pull hands to chest, squeezing shoulder blades together. Hold _3-5seconds. Repeat _10 times. Do _2_ sessions per day. Safety Note: Be sure anchor is secure.  Copyright  VHI. All rights reserved.  Strengthening: Resisted Extension   Hold band in both hands, Pull arm back, elbow straight, squeezing shoulder blades, Repeat _10___ times per set. Do _2___ sets per session. Do __1__ sessions per day.  http://orth.exer.us/833   Copyright  VHI. All rights reserved.    Copyright  VHI. All rights reserved.   Roll   Inhale and bring shoulders up, back, then exhale and relax shoulders down. Repeat _10__ times. Do _2-3__ times per day.  Copyright  VHI. All rights reserved.    CHEST: Doorway, Bilateral - Standing    Standing in doorway, place hands on wall with hands at shoulder height. Lean forward. Hold _20__ seconds. 2-3___ reps per set, __2_ sets per day, __5_ days per week  Copyright  VHI. All rights reserved.
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
## 2016-12-23 ENCOUNTER — Ambulatory Visit: Payer: Medicare Other | Admitting: Physical Therapy

## 2016-12-23 ENCOUNTER — Encounter: Payer: Self-pay | Admitting: Physical Therapy

## 2016-12-23 DIAGNOSIS — R293 Abnormal posture: Secondary | ICD-10-CM

## 2016-12-23 DIAGNOSIS — M542 Cervicalgia: Secondary | ICD-10-CM
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
## 2016-12-30 ENCOUNTER — Ambulatory Visit: Payer: Medicare Other | Admitting: Physical Therapy

## 2017-01-06 ENCOUNTER — Ambulatory Visit: Payer: Medicare Other | Admitting: Physical Therapy

## 2017-01-20 ENCOUNTER — Encounter: Payer: Self-pay | Admitting: Internal Medicine

## 2017-01-20 ENCOUNTER — Ambulatory Visit (INDEPENDENT_AMBULATORY_CARE_PROVIDER_SITE_OTHER): Payer: Medicare Other | Admitting: Internal Medicine

## 2017-01-20 ENCOUNTER — Telehealth: Payer: Self-pay

## 2017-01-20 VITALS — BP 146/82 | HR 69 | Temp 98.2°F | Wt 171.5 lb

## 2017-01-20 DIAGNOSIS — H5789 Other specified disorders of eye and adnexa: Secondary | ICD-10-CM

## 2017-01-20 NOTE — Assessment & Plan Note (Signed)
Likely an allergic reaction to something---?makeup Fairly acute---doubt hypothyroidism Discussed avoiding eye makeup for now Try loratadine If persists, would check free T4

## 2017-01-20 NOTE — Telephone Encounter (Signed)
Will assess at OV 

## 2017-01-20 NOTE — Telephone Encounter (Signed)
PLEASE NOTE: All timestamps contained within this report are represented as [place] Standard Time. CONFIDENTIALTY NOTICE: This fax transmission is intended only for the addressee. It contains information that is legally privileged, confidential or otherwise protected from use or disclosure. If you are not the intended recipient, you are strictly prohibited from reviewing, disclosing, copying using or disseminating any of this information or taking any action in reliance on or regarding this information. If you have received this fax in error, please notify us immediately by telephone so that we can arrange for its return to [place]. Phone: [phone], Toll-Free: [phone], Fax: [phone] Page: 1 of 2 Call Id: [id number] [place] Patient Name: Lacey Brown Gender: Female DOB: [birth year] Age: 76 Y 9 M 28 D Return Phone Number: [phone] (Primary) [place] [address] Client [place] Night - Client Client Site [facility] Physician Webb Silversmith - NP Contact Type Call Who Is Calling Patient / Member / Family / Caregiver Call Type Triage / Clinical Relationship To Patient Self Return Phone Number [phone] (Primary) Chief Complaint Eye Redness Reason for Call Symptomatic / Request for [place] states she has eye redness. Translation No Nurse Assessment Nurse: Adrian Blackwater, RN, Claiborne Billings Date/Time Eilene Ghazi Time): 01/18/2017 11:53:31 AM Confirm and document reason for call. If symptomatic, describe symptoms. ---Caller states around and underneath her eyes are red. It started a couple days ago, it is on her upper eyelid and underneath her eyes. It was slightly itchy in the beginning, and it has a slight burn. Does the patient have any new or worsening symptoms? ---Yes Will a  triage be completed? ---Yes Related visit to physician within the last 2 weeks? ---No Does the PT have any chronic conditions? (i.e. diabetes, asthma, etc.) ---Yes List chronic conditions. ---Htn Is this a behavioral health or substance abuse call? ---No Guidelines Guideline Title Affirmed Question Affirmed Notes Nurse Date/Time Eilene Ghazi Time) Eye Pain [1] Mild eye pain AND [2] present < 24 hours Gigi Gin 01/18/2017 11:59:23 AM Rash or Redness - Localized Mild localized rash Gigi Gin 01/18/2017 12:04:22 PM Disp. Time Eilene Ghazi Time) Disposition Final User 01/18/2017 12:02:57 Goldsby, RN, Kelly 01/18/2017 12:13:00 PM Home Care Yes Adrian Blackwater, RN, Claiborne Billings PLEASE NOTE: All timestamps contained within this report are represented as [place] Standard Time. CONFIDENTIALTY NOTICE: This fax transmission is intended only for the addressee. It contains information that is legally privileged, confidential or otherwise protected from use or disclosure. If you are not the intended recipient, you are strictly prohibited from reviewing, disclosing, copying using or disseminating any of this information or taking any action in reliance on or regarding this information. If you have received this fax in error, please notify us immediately by telephone so that we can arrange for its return to [place]. Phone: [phone], Toll-Free: [phone], Fax: [phone] Page: 2 of 2 Call Id: [id number] Caller Disagree/Comply Comply Caller Understands Yes PreDisposition Call Doctor Care Advice Given Per Guideline HOME CARE: You should be able to treat this at home. CALL BACK IF: * Pain increases * Pain persists over 24 hours * Pus or yellow/green discharge occurs * Blurred vision occurs * You become worse. HOME CARE: You should be able to treat this at home. AVOID THE CAUSE: * Try to find the cause. * Consider irritants like a plant (e.g., poison ivy or evergreens), chemicals (e.g.,  solvents  or insecticides), Fiberglass, a new cosmetic, or new jewelry (called contact dermatitis). [place] THE AREA: Wash the area once thoroughly with soap and water to remove any remaining irritants. Thereafter avoid soaps in this area. Cleanse the area if needed with warm water. LOCAL COLD: Apply ice or soak in cold water for 20 minutes every 3 or 4 hours to reduce itching or pain. HYDROCORTISONE CREAM FOR ITCHING: EXPECTED COURSE: Most of these rashes pass in 2 to 3 days. CALL BACK IF: * Rash spreads or becomes worse * Rash lasts over 1 week

## 2017-01-20 NOTE — Telephone Encounter (Signed)
Pt has appt with Dr Silvio Pate 01/20/17 at 12:15.

## 2017-01-20 NOTE — Progress Notes (Signed)
Subjective:    Patient ID: Lacey Brown, female    DOB: 11-03-1940, 76 y.o.   MRN: [id number]  HPI Here due to eye problem  Started about a week ago Itching and then it got redness around both eyes---puffy Thought it might be a reaction to her eye shadow--so she stopped it Seemed to worsen over the past couple of days  No conjunctival redness No discharge----typical film with contacts  Current Outpatient Medications on File Prior to Visit  Medication Sig Dispense Refill  . Cholecalciferol (VITAMIN D3) 2000 UNITS TABS Take 1 tablet by mouth daily.    . Fluticasone-Salmeterol (ADVAIR DISKUS) 250-50 MCG/DOSE AEPB Inhale 1 puff into the lungs 2 (two) times daily. 60 each 5  . ibuprofen (ADVIL,MOTRIN) 400 MG tablet Take 400 mg by mouth daily as needed.     Marland Kitchen losartan (COZAAR) 50 MG tablet TAKE 1 TABLET BY MOUTH EVERY DAY 90 tablet 0  . metoprolol succinate (TOPROL-XL) 25 MG 24 hr tablet TAKE 1 TABLET (25 MG TOTAL) BY MOUTH DAILY. 90 tablet 2  . Multiple Minerals-Vitamins (CALCIUM CITRATE PLUS PO) Take 1 tablet by mouth daily.    . Omega-3 Fatty Acids (FISH OIL) 1000 MG CAPS Take 1 capsule by mouth daily.    . vitamin C (ASCORBIC ACID) 500 MG tablet Take 1,000 mg by mouth daily as needed.      No current facility-administered medications on file prior to visit.     Allergies  Allergen Reactions  . Ace Inhibitors     Other reaction(s): Cough  . Codeine Nausea Only    Past Medical History:  Diagnosis Date  . Arthritis    back, neck; right shoulder;   . Asthma    using advair prn  . Chicken pox   . Hyperlipidemia   . Hypertension    controlled with medication;     Past Surgical History:  Procedure Laterality Date  . BREAST EXCISIONAL BIOPSY Left   . TONSILLECTOMY AND ADENOIDECTOMY      Family History  Problem Relation Age of Onset  . Heart disease Mother   . Heart disease Father   . Arthritis Maternal Grandmother   . Diabetes Neg Hx   . Cancer Neg Hx   .  Stroke Neg Hx     Social History   Socioeconomic History  . Marital status: Widowed    Spouse name: Not on file  . Number of children: Not on file  . Years of education: Not on file  . Highest education level: Not on file  Social Needs  . Financial resource strain: Not on file  . Food insecurity - worry: Not on file  . Food insecurity - inability: Not on file  . Transportation needs - medical: Not on file  . Transportation needs - non-medical: Not on file  Occupational History  . Not on file  Tobacco Use  . Smoking status: Former Smoker    Last attempt to quit: 04/17/2009    Years since quitting: 7.7  . Smokeless tobacco: Never Used  Substance and Sexual Activity  . Alcohol use: Yes    Alcohol/week: 0.0 oz    Comment: occasional  . Drug use: No  . Sexual activity: No  Other Topics Concern  . Not on file  Social History Narrative  . Not on file   Review of Systems No URI symptoms No fever No new foods or exposures    Objective:   Physical Exam  Constitutional: No distress.  Eyes:  Conjunctivae are normal. Pupils are equal, round, and reactive to light.  Mild periorbital swelling--mostly under eyes Eyes completely quiet otherwise          Assessment & Plan:

## 2017-01-20 NOTE — Patient Instructions (Signed)
Please try loratadine 10mg  daily for now--to see if that helps the swelling (and avoid eye makeup for now). If the swelling isn't better ina week or so, I would check your thyroid tests.

## 2017-01-30 DIAGNOSIS — L239 Allergic contact dermatitis, unspecified cause: Secondary | ICD-10-CM | POA: Diagnosis not present

## 2017-03-02 ENCOUNTER — Other Ambulatory Visit: Payer: Self-pay | Admitting: Internal Medicine

## 2017-03-03 ENCOUNTER — Encounter: Payer: Self-pay | Admitting: Internal Medicine

## 2017-03-03 ENCOUNTER — Telehealth: Payer: Self-pay | Admitting: Internal Medicine

## 2017-03-03 ENCOUNTER — Ambulatory Visit (INDEPENDENT_AMBULATORY_CARE_PROVIDER_SITE_OTHER): Payer: Medicare Other | Admitting: Internal Medicine

## 2017-03-03 VITALS — BP 134/78 | HR 74 | Temp 98.0°F | Wt 169.0 lb

## 2017-03-03 DIAGNOSIS — J4521 Mild intermittent asthma with (acute) exacerbation: Secondary | ICD-10-CM

## 2017-03-03 DIAGNOSIS — J219 Acute bronchiolitis, unspecified: Secondary | ICD-10-CM | POA: Diagnosis not present

## 2017-03-03 MED ORDER — AZITHROMYCIN 250 MG PO TABS: ORAL_TABLET | ORAL | 0 refills | Status: DC

## 2017-03-03 MED ORDER — PREDNISONE 10 MG PO TABS: ORAL_TABLET | ORAL | 0 refills | Status: DC

## 2017-03-03 NOTE — Telephone Encounter (Signed)
She shouldn't need to take the Mucinex along with abx and pred taper

## 2017-03-03 NOTE — Progress Notes (Signed)
Subjective:    Patient ID: Lacey Brown, female    DOB: 1941/01/18, 77 y.o.   MRN: [id number]  HPI  Pt presents to the clinic today with c/o cough and chest congestion. This started 1 week ago. She is coughing up clear mucous. She has some SOB with coughing spells. She denies runny nose, nasal congestion, ear pain or sore throat. She denies fever, chills or body aches. She has tried Mucinex and Claritin without any relief. She has had sick contacts.  Review of Systems      Past Medical History:  Diagnosis Date  . Arthritis    back, neck; right shoulder;   . Asthma    using advair prn  . Chicken pox   . Hyperlipidemia   . Hypertension    controlled with medication;     Current Outpatient Medications  Medication Sig Dispense Refill  . Cholecalciferol (VITAMIN D3) 2000 UNITS TABS Take 1 tablet by mouth daily.    . Fluticasone-Salmeterol (ADVAIR DISKUS) 250-50 MCG/DOSE AEPB Inhale 1 puff into the lungs 2 (two) times daily. 60 each 5  . ibuprofen (ADVIL,MOTRIN) 400 MG tablet Take 400 mg by mouth daily as needed.     Marland Kitchen losartan (COZAAR) 50 MG tablet TAKE 1 TABLET BY MOUTH EVERY DAY 90 tablet 0  . metoprolol succinate (TOPROL-XL) 25 MG 24 hr tablet TAKE 1 TABLET (25 MG TOTAL) BY MOUTH DAILY. 90 tablet 2  . Multiple Minerals-Vitamins (CALCIUM CITRATE PLUS PO) Take 1 tablet by mouth daily.    . Omega-3 Fatty Acids (FISH OIL) 1000 MG CAPS Take 1 capsule by mouth daily.    . vitamin C (ASCORBIC ACID) 500 MG tablet Take 1,000 mg by mouth daily as needed.      No current facility-administered medications for this visit.     Allergies  Allergen Reactions  . Ace Inhibitors Cough    Other reaction(s): Cough  . Codeine Nausea Only    Family History  Problem Relation Age of Onset  . Heart disease Mother   . Heart disease Father   . Arthritis Maternal Grandmother   . Diabetes Neg Hx   . Cancer Neg Hx   . Stroke Neg Hx     Social History   Socioeconomic History  . Marital  status: Widowed    Spouse name: Not on file  . Number of children: Not on file  . Years of education: Not on file  . Highest education level: Not on file  Social Needs  . Financial resource strain: Not on file  . Food insecurity - worry: Not on file  . Food insecurity - inability: Not on file  . Transportation needs - medical: Not on file  . Transportation needs - non-medical: Not on file  Occupational History  . Not on file  Tobacco Use  . Smoking status: Former Smoker    Last attempt to quit: 04/17/2009    Years since quitting: 7.8  . Smokeless tobacco: Never Used  Substance and Sexual Activity  . Alcohol use: Yes    Alcohol/week: 0.0 oz    Comment: occasional  . Drug use: No  . Sexual activity: No  Other Topics Concern  . Not on file  Social History Narrative  . Not on file     Constitutional: Denies fever, malaise, fatigue, headache or abrupt weight changes.  HEENT: Denies eye pain, eye redness, ear pain, ringing in the ears, wax buildup, runny nose, nasal congestion, bloody nose, or sore throat. Respiratory: Pt  reports cough and chest congestion. Denies difficulty breathing.   Cardiovascular: Denies chest pain, chest tightness, palpitations or swelling in the hands or feet.   No other specific complaints in a complete review of systems (except as listed in HPI above).  Objective:   Physical Exam   BP 134/78 (BP Location: Right Arm, Patient Position: Sitting, Cuff Size: Normal)   Pulse 74   Temp 98 F (36.7 C) (Oral)   Wt 169 lb (76.7 kg)   SpO2 97%   BMI 33.01 kg/m  Wt Readings from Last 3 Encounters:  03/03/17 169 lb (76.7 kg)  01/20/17 171 lb 8 oz (77.8 kg)  09/16/16 171 lb (77.6 kg)    General: Appears her stated age, in NAD. HEENT: Throat/Mouth: Teeth present, mucosa pink and moist, no exudate, lesions or ulcerations noted.  Neck:  No adenopathy noted.  Cardiovascular: Normal rate and rhythm. S1,S2 noted.  No murmur, rubs or gallops noted.    Pulmonary/Chest: Normal effort with scattered rhonchi and wheezing throughout. No respiratory distress.     BMET    Component Value Date/Time   NA 132 (L) 09/16/2016 1343   K 4.5 09/16/2016 1343   CL 98 09/16/2016 1343   CO2 27 09/16/2016 1343   GLUCOSE 94 09/16/2016 1343   BUN 15 09/16/2016 1343   CREATININE 0.59 09/16/2016 1343   CALCIUM 9.3 09/16/2016 1343    Lipid Panel     Component Value Date/Time   CHOL 188 09/16/2016 1343   TRIG 55.0 09/16/2016 1343   HDL 70.60 09/16/2016 1343   CHOLHDL 3 09/16/2016 1343   VLDL 11.0 09/16/2016 1343   LDLCALC 106 (H) 09/16/2016 1343    CBC    Component Value Date/Time   WBC 7.5 09/16/2016 1343   RBC 4.15 09/16/2016 1343   HGB 12.6 09/16/2016 1343   HCT 38.8 09/16/2016 1343   PLT 235.0 09/16/2016 1343   MCV 93.4 09/16/2016 1343   MCHC 32.4 09/16/2016 1343   RDW 13.9 09/16/2016 1343    Hgb A1C No results found for: HGBA1C         Assessment & Plan:   Acute Bronchitis/Asthma Exacerbation:  Get some rest and drink plenty of fluids eRx for Pred Taper x 6 days eRx for Azithromax x 5 days Delsym as needed for cough  Return precautions discussed Webb Silversmith, NP

## 2017-03-03 NOTE — Patient Instructions (Signed)

## 2017-03-03 NOTE — Telephone Encounter (Signed)
Copied from [place] [phone]. Topic: Quick Communication - Rx Refill/Question >> Mar 03, 2017 12:55 PM Scherrie Gerlach wrote: Medication: musinex Pt seen today and forgot to ask the dr if she should stop the mucinex otc she was taking before she was prescribed the meds today Pt would like a call back

## 2017-03-06 NOTE — Telephone Encounter (Signed)
Called pt back but recording came on stating that the phone has been disconnected or number has been changed

## 2017-04-15 DIAGNOSIS — H2513 Age-related nuclear cataract, bilateral: Secondary | ICD-10-CM | POA: Diagnosis not present

## 2017-07-10 ENCOUNTER — Encounter: Payer: Self-pay | Admitting: Internal Medicine

## 2017-07-10 ENCOUNTER — Ambulatory Visit (INDEPENDENT_AMBULATORY_CARE_PROVIDER_SITE_OTHER): Payer: Medicare Other | Admitting: Internal Medicine

## 2017-07-10 VITALS — BP 136/84 | HR 69 | Temp 98.2°F | Wt 171.0 lb

## 2017-07-10 DIAGNOSIS — H6122 Impacted cerumen, left ear: Secondary | ICD-10-CM | POA: Diagnosis not present

## 2017-07-17 ENCOUNTER — Encounter: Payer: Self-pay | Admitting: Internal Medicine

## 2017-07-17 NOTE — Progress Notes (Signed)
Subjective:    Patient ID: Lacey Brown, female    DOB: May 11, 1940, 77 y.o.   MRN: [id number]  HPI  Pt presents to the clinic today with c/o left ear fullness. This started 2-3 days ago. She has had some associated loss of hearing. She denies ear pain or drainage. She has tried some OTC ear drops, not sure what kind.  Review of Systems      Past Medical History:  Diagnosis Date  . Arthritis    back, neck; right shoulder;   . Asthma    using advair prn  . Chicken pox   . Hyperlipidemia   . Hypertension    controlled with medication;     Current Outpatient Medications  Medication Sig Dispense Refill  . Cholecalciferol (VITAMIN D3) 2000 UNITS TABS Take 1 tablet by mouth daily.    . Fluticasone-Salmeterol (ADVAIR DISKUS) 250-50 MCG/DOSE AEPB Inhale 1 puff into the lungs 2 (two) times daily. 60 each 5  . ibuprofen (ADVIL,MOTRIN) 400 MG tablet Take 400 mg by mouth daily as needed.     Marland Kitchen losartan (COZAAR) 50 MG tablet TAKE 1 TABLET BY MOUTH EVERY DAY 90 tablet 1  . metoprolol succinate (TOPROL-XL) 25 MG 24 hr tablet TAKE 1 TABLET (25 MG TOTAL) BY MOUTH DAILY. 90 tablet 2  . Multiple Minerals-Vitamins (CALCIUM CITRATE PLUS PO) Take 1 tablet by mouth daily.    . Omega-3 Fatty Acids (FISH OIL) 1000 MG CAPS Take 1 capsule by mouth daily.    . vitamin C (ASCORBIC ACID) 500 MG tablet Take 1,000 mg by mouth daily as needed.      No current facility-administered medications for this visit.     Allergies  Allergen Reactions  . Ace Inhibitors Cough    Other reaction(s): Cough  . Codeine Nausea Only    Family History  Problem Relation Age of Onset  . Heart disease Mother   . Heart disease Father   . Arthritis Maternal Grandmother   . Diabetes Neg Hx   . Cancer Neg Hx   . Stroke Neg Hx     Social History   Socioeconomic History  . Marital status: Widowed    Spouse name: Not on file  . Number of children: Not on file  . Years of education: Not on file  . Highest  education level: Not on file  Occupational History  . Not on file  Social Needs  . Financial resource strain: Not on file  . Food insecurity:    Worry: Not on file    Inability: Not on file  . Transportation needs:    Medical: Not on file    Non-medical: Not on file  Tobacco Use  . Smoking status: Former Smoker    Last attempt to quit: 04/17/2009    Years since quitting: 8.2  . Smokeless tobacco: Never Used  Substance and Sexual Activity  . Alcohol use: Yes    Alcohol/week: 0.0 oz    Comment: occasional  . Drug use: No  . Sexual activity: Never  Lifestyle  . Physical activity:    Days per week: Not on file    Minutes per session: Not on file  . Stress: Not on file  Relationships  . Social connections:    Talks on phone: Not on file    Gets together: Not on file    Attends religious service: Not on file    Active member of club or organization: Not on file    Attends meetings  of clubs or organizations: Not on file    Relationship status: Not on file  . Intimate partner violence:    Fear of current or ex partner: Not on file    Emotionally abused: Not on file    Physically abused: Not on file    Forced sexual activity: Not on file  Other Topics Concern  . Not on file  Social History Narrative  . Not on file     Constitutional: Denies fever, malaise, fatigue, headache or abrupt weight changes.  HEENT: Pt reports left ear fullness, decreased hearing .Denies eye pain, eye redness, ear pain, ringing in the ears, wax buildup, runny nose, nasal congestion, bloody nose, or sore throat. Respiratory: Denies difficulty breathing, shortness of breath, cough or sputum production.    No other specific complaints in a complete review of systems (except as listed in HPI above).  Objective:   Physical Exam  BP 136/84   Pulse 69   Temp 98.2 F (36.8 C) (Oral)   Wt 171 lb (77.6 kg)   SpO2 96%   BMI 33.40 kg/m  Wt Readings from Last 3 Encounters:  07/10/17 171 lb (77.6 kg)    03/03/17 169 lb (76.7 kg)  01/20/17 171 lb 8 oz (77.8 kg)    General: Appears her stated age, well developed, well nourished in NAD. HEENT: Head: normal shape and size; Right Ear: Tm's gray and intact, normal light reflex; Left Ear: cerumen impaction.  BMET    Component Value Date/Time   NA 132 (L) 09/16/2016 1343   K 4.5 09/16/2016 1343   CL 98 09/16/2016 1343   CO2 27 09/16/2016 1343   GLUCOSE 94 09/16/2016 1343   BUN 15 09/16/2016 1343   CREATININE 0.59 09/16/2016 1343   CALCIUM 9.3 09/16/2016 1343    Lipid Panel     Component Value Date/Time   CHOL 188 09/16/2016 1343   TRIG 55.0 09/16/2016 1343   HDL 70.60 09/16/2016 1343   CHOLHDL 3 09/16/2016 1343   VLDL 11.0 09/16/2016 1343   LDLCALC 106 (H) 09/16/2016 1343    CBC    Component Value Date/Time   WBC 7.5 09/16/2016 1343   RBC 4.15 09/16/2016 1343   HGB 12.6 09/16/2016 1343   HCT 38.8 09/16/2016 1343   PLT 235.0 09/16/2016 1343   MCV 93.4 09/16/2016 1343   MCHC 32.4 09/16/2016 1343   RDW 13.9 09/16/2016 1343    Hgb A1C No results found for: HGBA1C         Assessment & Plan:   Left Cerumen Impaction with Hearing Loss:  Manual lavage by CMA Encouraged her to try Debrox OTC 2 x week to prevent wax buildup  Return precautions discussed Webb Silversmith, NP

## 2017-07-17 NOTE — Patient Instructions (Signed)
Earwax Buildup, Adult The ears produce a substance called earwax that helps keep bacteria out of the ear and protects the skin in the ear canal. Occasionally, earwax can build up in the ear and cause discomfort or hearing loss. What increases the risk? This condition is more likely to develop in people who:  Are female.  Are elderly.  Naturally produce more earwax.  Clean their ears often with cotton swabs.  Use earplugs often.  Use in-ear headphones often.  Wear hearing aids.  Have narrow ear canals.  Have earwax that is overly thick or sticky.  Have eczema.  Are dehydrated.  Have excess hair in the ear canal.  What are the signs or symptoms? Symptoms of this condition include:  Reduced or muffled hearing.  A feeling of fullness in the ear or feeling that the ear is plugged.  Fluid coming from the ear.  Ear pain.  Ear itch.  Ringing in the ear.  Coughing.  An obvious piece of earwax that can be seen inside the ear canal.  How is this diagnosed? This condition may be diagnosed based on:  Your symptoms.  Your medical history.  An ear exam. During the exam, your health care provider will look into your ear with an instrument called an otoscope.  You may have tests, including a hearing test. How is this treated? This condition may be treated by:  Using ear drops to soften the earwax.  Having the earwax removed by a health care provider. The health care provider may: ? Flush the ear with water. ? Use an instrument that has a loop on the end (curette). ? Use a suction device.  Surgery to remove the wax buildup. This may be done in severe cases.  Follow these instructions at home:  Take over-the-counter and prescription medicines only as told by your health care provider.  Do not put any objects, including cotton swabs, into your ear. You can clean the opening of your ear canal with a washcloth or facial tissue.  Follow instructions from your health  care provider about cleaning your ears. Do not over-clean your ears.  Drink enough fluid to keep your urine clear or pale yellow. This will help to thin the earwax.  Keep all follow-up visits as told by your health care provider. If earwax builds up in your ears often or if you use hearing aids, consider seeing your health care provider for routine, preventive ear cleanings. Ask your health care provider how often you should schedule your cleanings.  If you have hearing aids, clean them according to instructions from the manufacturer and your health care provider. Contact a health care provider if:  You have ear pain.  You develop a fever.  You have blood, pus, or other fluid coming from your ear.  You have hearing loss.  You have ringing in your ears that does not go away.  Your symptoms do not improve with treatment.  You feel like the room is spinning (vertigo). Summary  Earwax can build up in the ear and cause discomfort or hearing loss.  The most common symptoms of this condition include reduced or muffled hearing and a feeling of fullness in the ear or feeling that the ear is plugged.  This condition may be diagnosed based on your symptoms, your medical history, and an ear exam.  This condition may be treated by using ear drops to soften the earwax or by having the earwax removed by a health care provider.  Do   not put any objects, including cotton swabs, into your ear. You can clean the opening of your ear canal with a washcloth or facial tissue. This information is not intended to replace advice given to you by your health care provider. Make sure you discuss any questions you have with your health care provider. Document Released: 02/29/2004 Document Revised: 04/03/2016 Document Reviewed: 04/03/2016 Elsevier Interactive Patient Education  2018 [employer]  

## 2017-07-24 ENCOUNTER — Other Ambulatory Visit: Payer: Self-pay | Admitting: Internal Medicine

## 2017-08-25 ENCOUNTER — Other Ambulatory Visit: Payer: Self-pay | Admitting: Internal Medicine

## 2017-08-31 ENCOUNTER — Other Ambulatory Visit: Payer: Self-pay | Admitting: Internal Medicine

## 2017-09-08 ENCOUNTER — Other Ambulatory Visit: Payer: Self-pay | Admitting: Internal Medicine

## 2017-09-08 DIAGNOSIS — Z1231 Encounter for screening mammogram for malignant neoplasm of breast: Secondary | ICD-10-CM

## 2017-09-16 DIAGNOSIS — H02051 Trichiasis without entropian right upper eyelid: Secondary | ICD-10-CM | POA: Diagnosis not present

## 2017-09-16 DIAGNOSIS — H02054 Trichiasis without entropian left upper eyelid: Secondary | ICD-10-CM | POA: Diagnosis not present

## 2017-10-13 ENCOUNTER — Ambulatory Visit
Admission: RE | Admit: 2017-10-13 | Discharge: 2017-10-13 | Disposition: A | Discharge status: Home / Self Care | Payer: Medicare Other | Source: Ambulatory Visit | Attending: Internal Medicine | Admitting: Internal Medicine

## 2017-10-13 DIAGNOSIS — Z1231 Encounter for screening mammogram for malignant neoplasm of breast: Secondary | ICD-10-CM

## 2017-10-13 IMAGING — MG DIGITAL SCREENING BILATERAL MAMMOGRAM WITH TOMO AND CAD
6 of 12 series · 6 of 36 positions shown · non-contrast
Comparison: Previous exam(s).

CLINICAL DATA: Screening.

EXAM:
DIGITAL SCREENING BILATERAL MAMMOGRAM WITH TOMO AND CAD

[R CC synth-2D (1 of 2)]
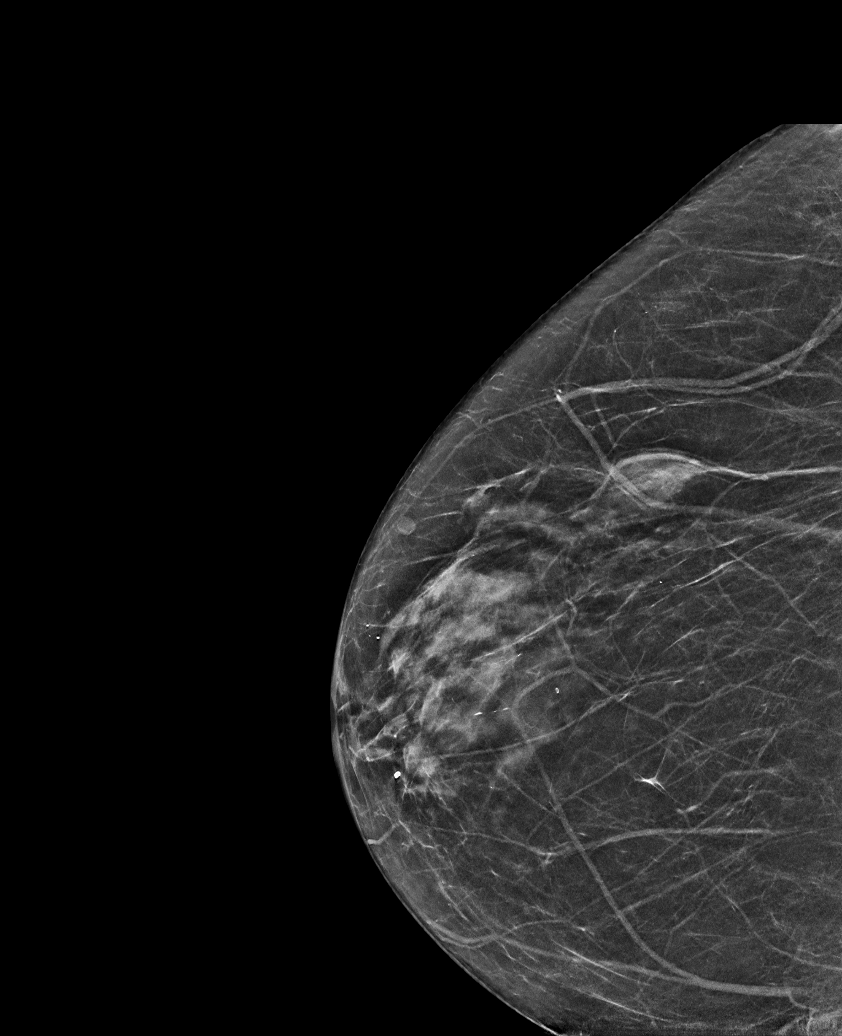

[R MLO synth-2D]
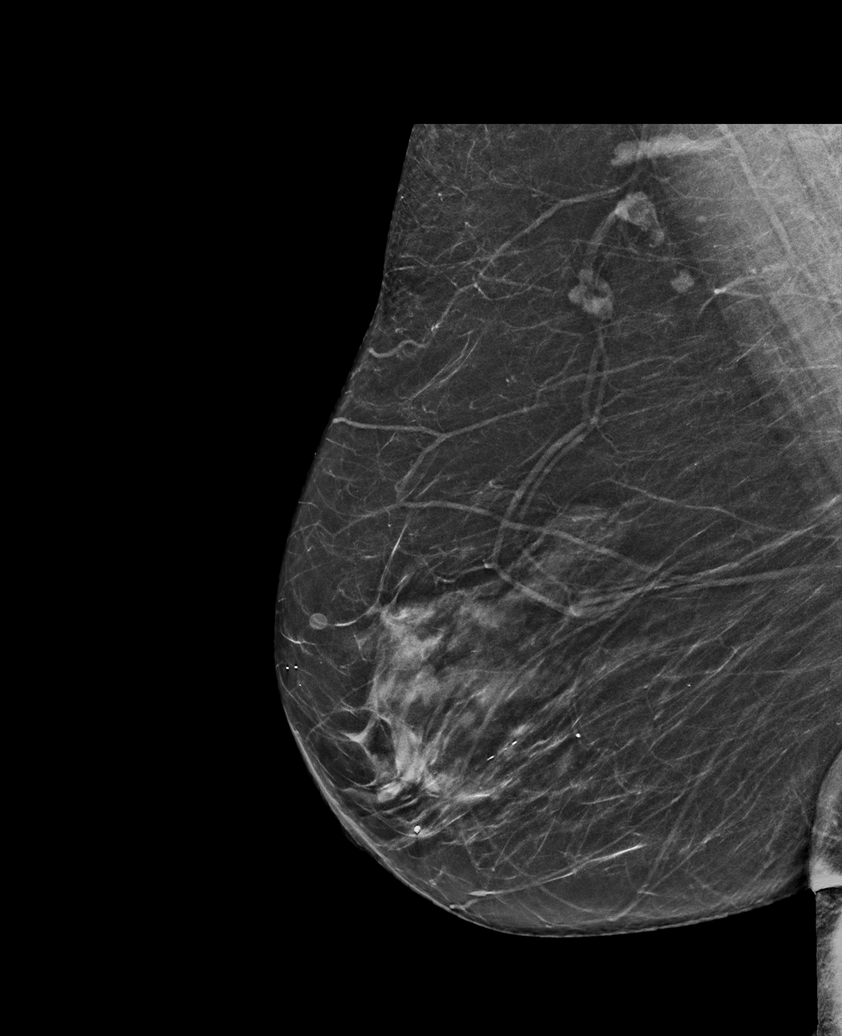

[L CC synth-2D (1 of 2)]
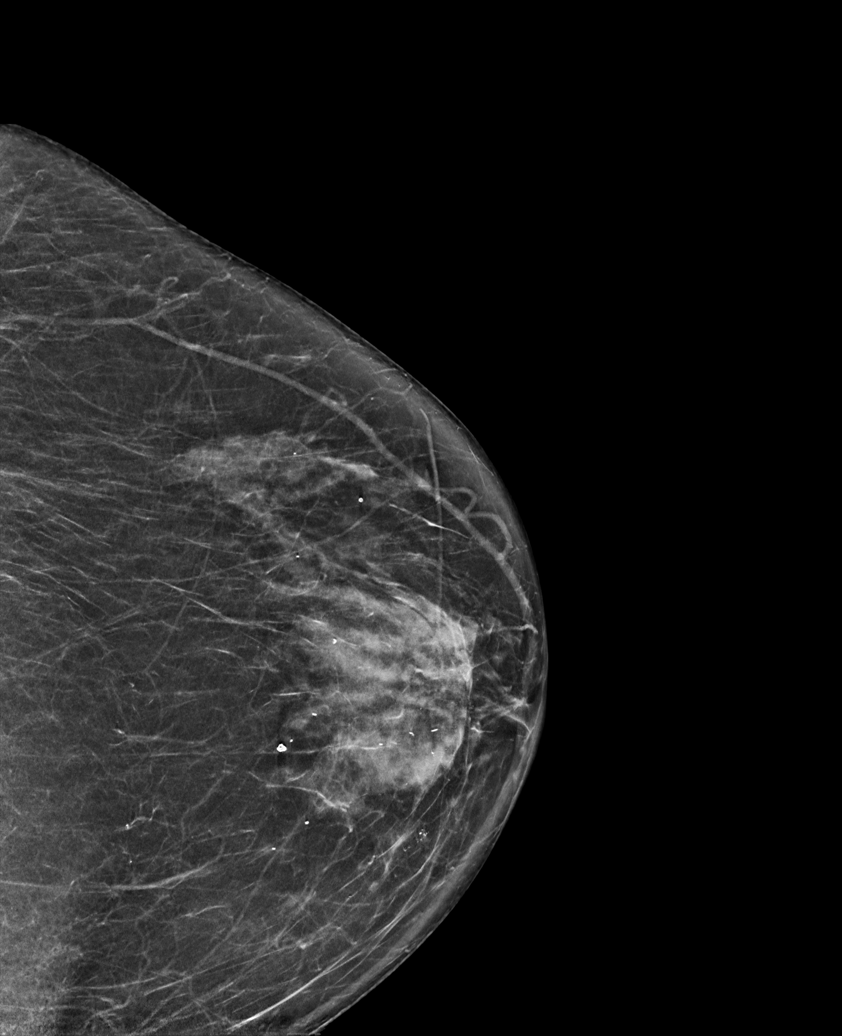

[R CC synth-2D (2 of 2)]
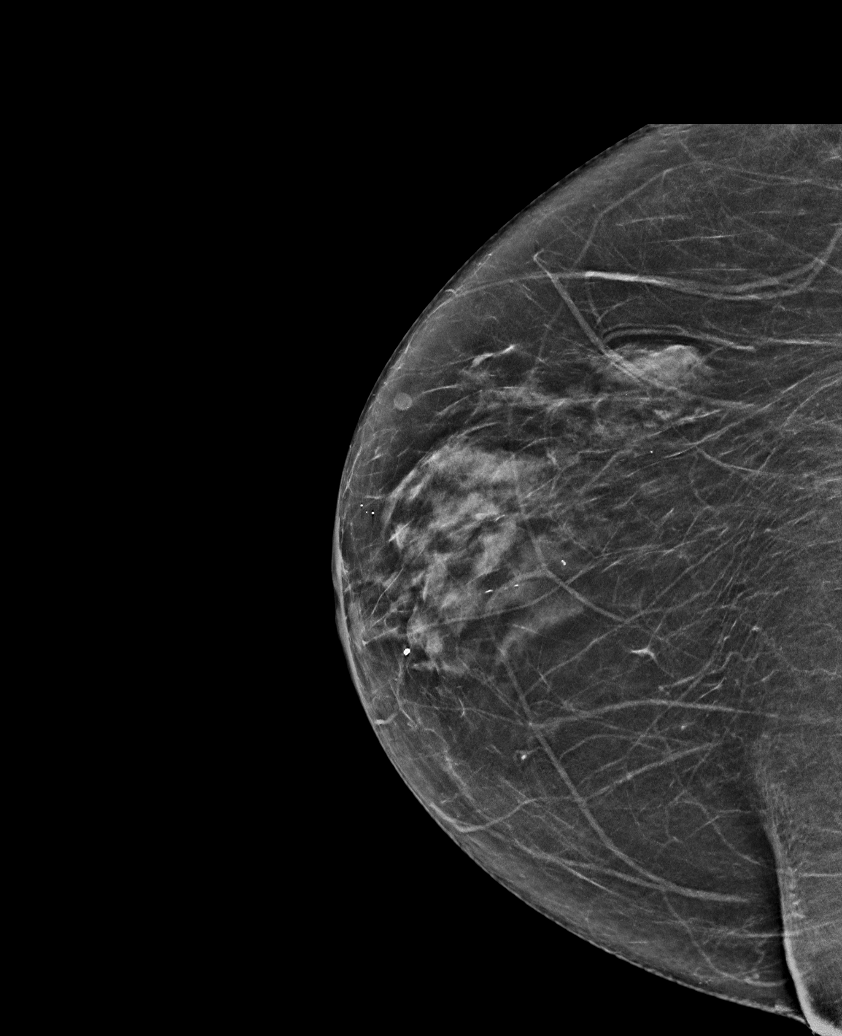

[L CC synth-2D (2 of 2)]
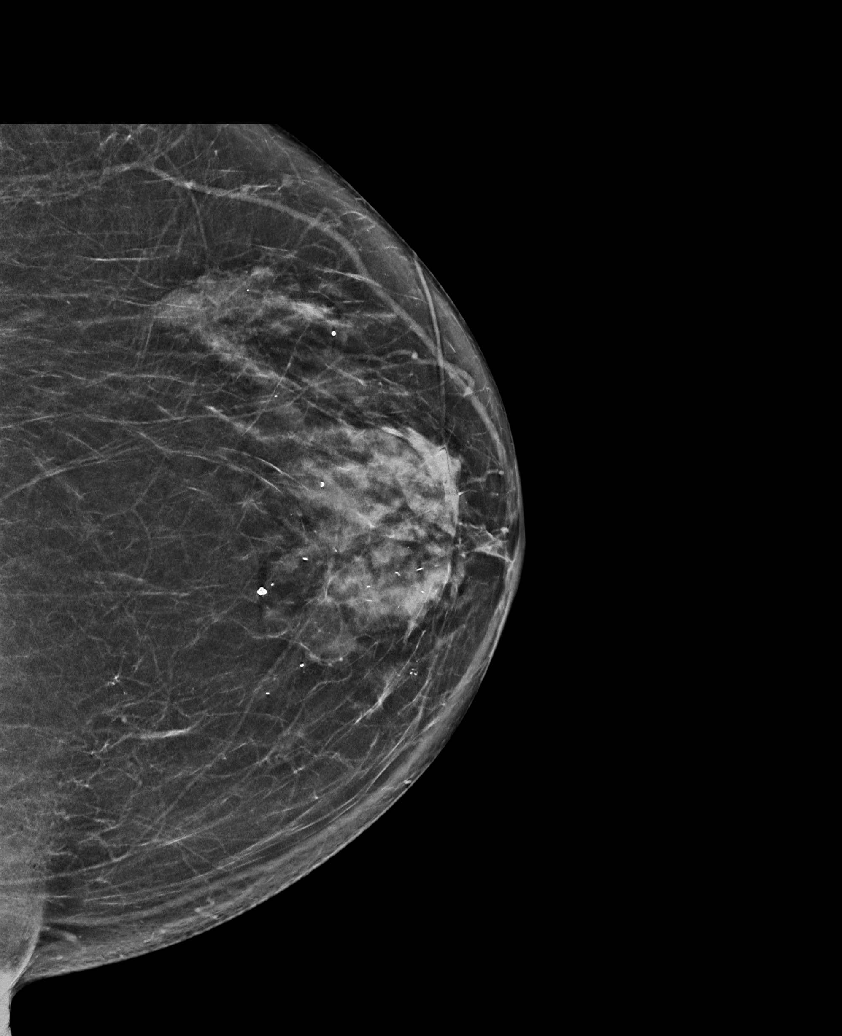

[L MLO synth-2D]
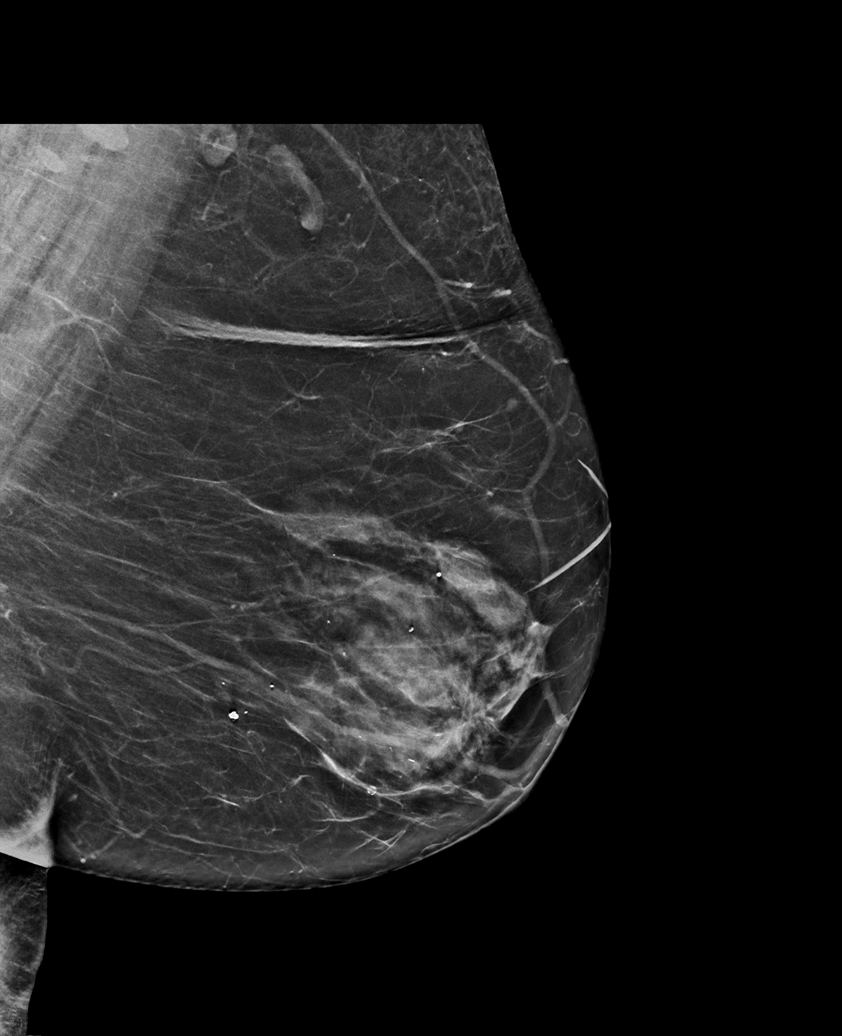

[6 of 36 positions shown; findings below may reference images not displayed]

ACR Breast Density Category b: There are scattered areas of
fibroglandular density.
FINDINGS: There are no findings suspicious for malignancy. Images were
processed with CAD.
IMPRESSION: No mammographic evidence of malignancy. A result letter of this
screening mammogram will be mailed directly to the patient.

RECOMMENDATION:
Screening mammogram in one year. (Code:[TQ])

BI-RADS CATEGORY  1: Negative.

## 2017-10-26 ENCOUNTER — Other Ambulatory Visit: Payer: Self-pay | Admitting: Internal Medicine

## 2017-10-27 ENCOUNTER — Telehealth: Payer: Self-pay | Admitting: Internal Medicine

## 2017-10-27 ENCOUNTER — Other Ambulatory Visit: Payer: Self-pay | Admitting: Internal Medicine

## 2017-10-27 MED ORDER — METOPROLOL SUCCINATE ER 25 MG PO TB24: ORAL_TABLET | ORAL | 0 refills | Status: DC

## 2017-10-27 NOTE — Telephone Encounter (Signed)
Copied from [place] [id number]. Topic: Quick Communication - See Telephone Encounter >> Oct 27, 2017 10:01 AM Blase Mess A wrote: CRM for notification. See Telephone encounter for: 10/27/17. Patient is requesting a call back from a nurse to schedule her annual wellness visit.  She said AWV should have been scheduled back in August.  But she has not heard anything.  Please advise [phone]

## 2017-10-27 NOTE — Telephone Encounter (Signed)
Copied from [place] [phone]. Topic: Quick Communication - Rx Refill/Question >> Oct 27, 2017  9:57 AM Blase Mess A wrote: Medication: metoprolol succinate (TOPROL-XL) 25 MG 24 hr tablet [[id number]]  Patient has appt 12/16/17  Has the patient contacted their pharmacy? Yes  (Agent: If no, request that the patient contact the pharmacy for the refill.) (Agent: If yes, when and what did the pharmacy advise?)  Preferred Pharmacy (with phone number or street name): [id number] [place] [address] [place] [address] Phone: [phone] Fax: [phone]    Agent: Please be advised that RX refills may take up to 3 business days. We ask that you follow-up with your pharmacy. >> Oct 27, 2017  9:58 AM Amada Kingfisher, CMA wrote: Patient will need OV. Left message on machine for pt to return call to the office.

## 2017-10-27 NOTE — Telephone Encounter (Signed)
Pt would like to have metoprolol refill; she has annual wellness visit 12/16/17 and does not want to come in for annual physical prior to this; can her refill request be granted; pharmacy note from 07/25/17 states "must schedule annual visit"; will route to office for final disposition  metoprolol refill Last Refill:07/25/17 # 90 Last OV: AWV 8//13/18 PCP: Webb Silversmith Pharmacy: [facility] [address] [place], [place]

## 2017-10-28 MED ORDER — METOPROLOL SUCCINATE ER 25 MG PO TB24: ORAL_TABLET | ORAL | 0 refills | Status: DC

## 2017-10-28 NOTE — Addendum Note (Signed)
Addended by: Lurlean Nanny on: 10/28/2017 09:59 AM   Modules accepted: Orders

## 2017-10-29 NOTE — Telephone Encounter (Signed)
Pt is scheduled 11/18/2017.

## 2017-11-06 DIAGNOSIS — Z23 Encounter for immunization: Secondary | ICD-10-CM | POA: Diagnosis not present

## 2017-11-18 ENCOUNTER — Ambulatory Visit: Payer: Medicare Other | Admitting: Internal Medicine

## 2017-11-19 DIAGNOSIS — H25813 Combined forms of age-related cataract, bilateral: Secondary | ICD-10-CM | POA: Diagnosis not present

## 2017-11-25 ENCOUNTER — Encounter (INDEPENDENT_AMBULATORY_CARE_PROVIDER_SITE_OTHER): Payer: Self-pay

## 2017-11-25 ENCOUNTER — Ambulatory Visit (INDEPENDENT_AMBULATORY_CARE_PROVIDER_SITE_OTHER): Payer: Medicare Other | Admitting: Internal Medicine

## 2017-11-25 ENCOUNTER — Encounter: Payer: Self-pay | Admitting: Internal Medicine

## 2017-11-25 ENCOUNTER — Ambulatory Visit (INDEPENDENT_AMBULATORY_CARE_PROVIDER_SITE_OTHER)
Admission: RE | Admit: 2017-11-25 | Discharge: 2017-11-25 | Disposition: A | Discharge status: Home / Self Care | Payer: Medicare Other | Source: Ambulatory Visit | Attending: Internal Medicine | Admitting: Internal Medicine

## 2017-11-25 VITALS — BP 132/78 | HR 68 | Temp 98.3°F | Ht 60.0 in | Wt 173.0 lb

## 2017-11-25 DIAGNOSIS — Z0000 Encounter for general adult medical examination without abnormal findings: Secondary | ICD-10-CM | POA: Diagnosis not present

## 2017-11-25 DIAGNOSIS — M47815 Spondylosis without myelopathy or radiculopathy, thoracolumbar region: Secondary | ICD-10-CM | POA: Diagnosis not present

## 2017-11-25 DIAGNOSIS — I10 Essential (primary) hypertension: Secondary | ICD-10-CM | POA: Diagnosis not present

## 2017-11-25 DIAGNOSIS — J4520 Mild intermittent asthma, uncomplicated: Secondary | ICD-10-CM

## 2017-11-25 DIAGNOSIS — E559 Vitamin D deficiency, unspecified: Secondary | ICD-10-CM

## 2017-11-25 DIAGNOSIS — M545 Low back pain: Secondary | ICD-10-CM | POA: Diagnosis not present

## 2017-11-25 DIAGNOSIS — E7800 Pure hypercholesterolemia, unspecified: Secondary | ICD-10-CM

## 2017-11-25 LAB — COMPREHENSIVE METABOLIC PANEL
ALBUMIN: 4.2 g/dL (ref 3.5–5.2)
ALT: 17 U/L (ref 0–35)
AST: 17 U/L (ref 0–37)
Alkaline Phosphatase: 53 U/L (ref 39–117)
BUN: 18 mg/dL (ref 6–23)
CHLORIDE: 99 meq/L (ref 96–112)
CO2: 30 meq/L (ref 19–32)
CREATININE: 0.79 mg/dL (ref 0.40–1.20)
Calcium: 9.4 mg/dL (ref 8.4–10.5)
GFR: 74.68 mL/min (ref >60.00)
Glucose, Bld: 93 mg/dL (ref 70–99)
Potassium: 4.5 mEq/L (ref 3.5–5.1)
SODIUM: 133 meq/L — AB (ref 135–145)
Total Bilirubin: 0.4 mg/dL (ref 0.2–1.2)
Total Protein: 6.8 g/dL (ref 6.0–8.3)

## 2017-11-25 LAB — CBC
HCT: 37.8 % (ref 36.0–46.0)
Hemoglobin: 12.8 g/dL (ref 12.0–15.0)
MCHC: 33.8 g/dL (ref 30.0–36.0)
MCV: 91.1 fl (ref 78.0–100.0)
Platelets: 224 10*3/uL (ref 150.0–400.0)
RBC: 4.15 Mil/uL (ref 3.87–5.11)
RDW: 13.2 % (ref 11.5–15.5)
WBC: 7.4 10*3/uL (ref 4.0–10.5)

## 2017-11-25 LAB — LIPID PANEL
CHOL/HDL RATIO: 3
Cholesterol: 191 mg/dL (ref 0–200)
HDL: 70.8 mg/dL (ref >39.00)
LDL CALC: 107 mg/dL — AB (ref 0–99)
NonHDL: 120.48
Triglycerides: 69 mg/dL (ref 0.0–149.0)
VLDL: 13.8 mg/dL (ref 0.0–40.0)

## 2017-11-25 LAB — VITAMIN D 25 HYDROXY (VIT D DEFICIENCY, FRACTURES): VITD: 43.13 ng/mL (ref 30.00–100.00)

## 2017-11-25 IMAGING — DX DG LUMBAR SPINE COMPLETE 4+V
5 series · 5 of 5 positions shown · non-contrast
Comparison: No prior.

CLINICAL DATA: Low back pain.  History of arthritis.

EXAM:
LUMBAR SPINE - COMPLETE 4+ VIEW

[l-spine ap]
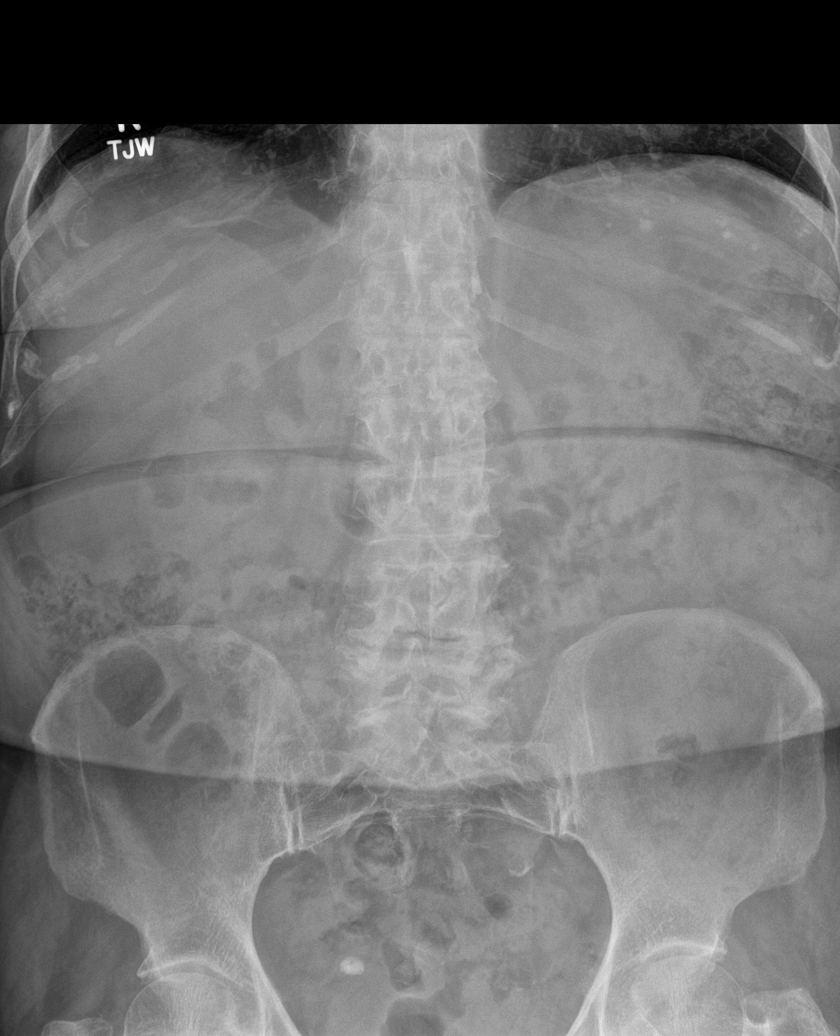

[l-spine obl (1 of 2)]
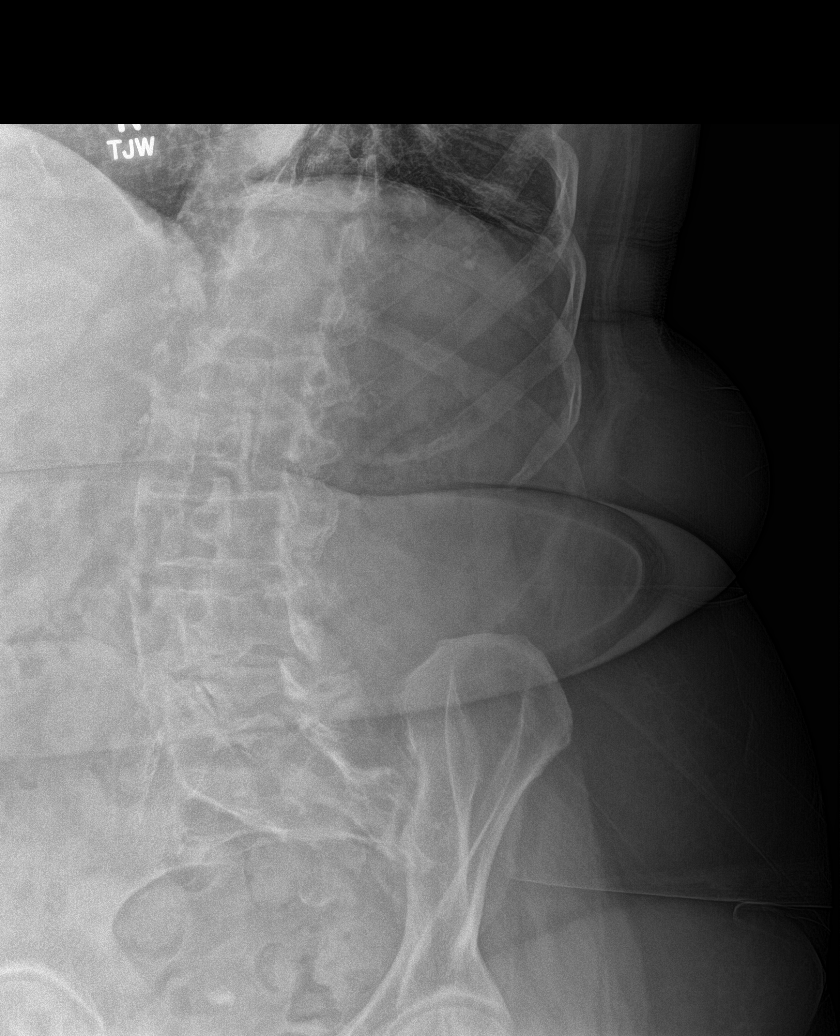

[l-spine obl (2 of 2)]
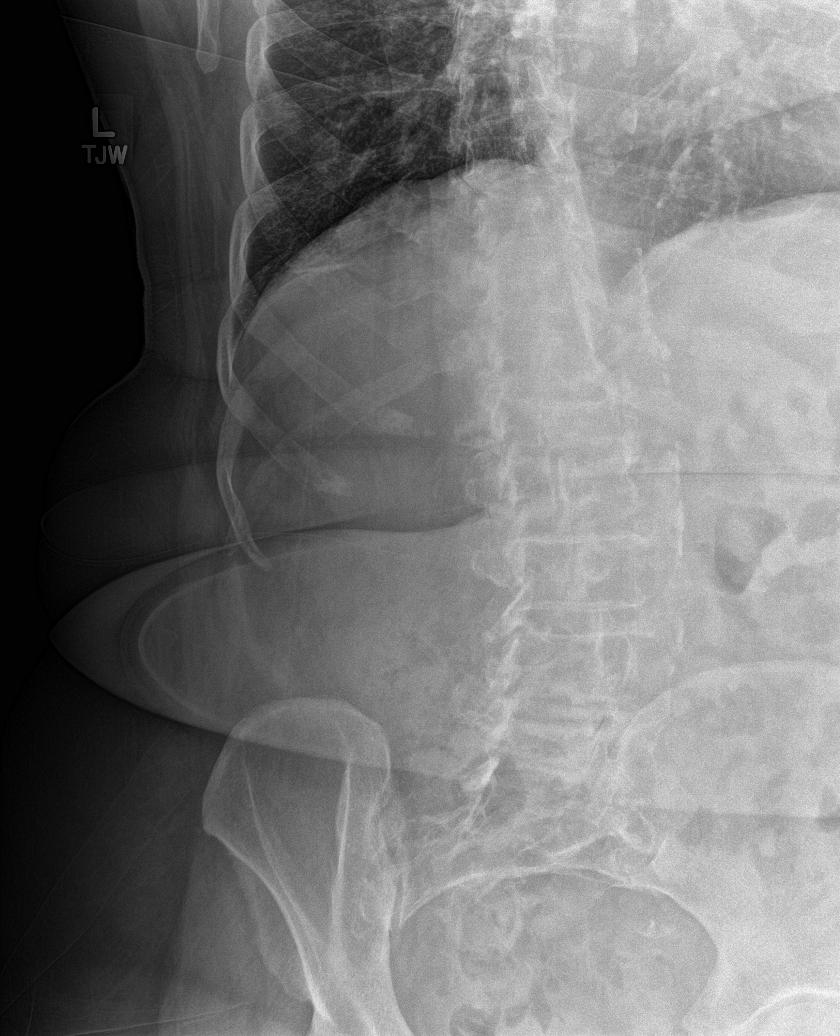

[l-spine lat]
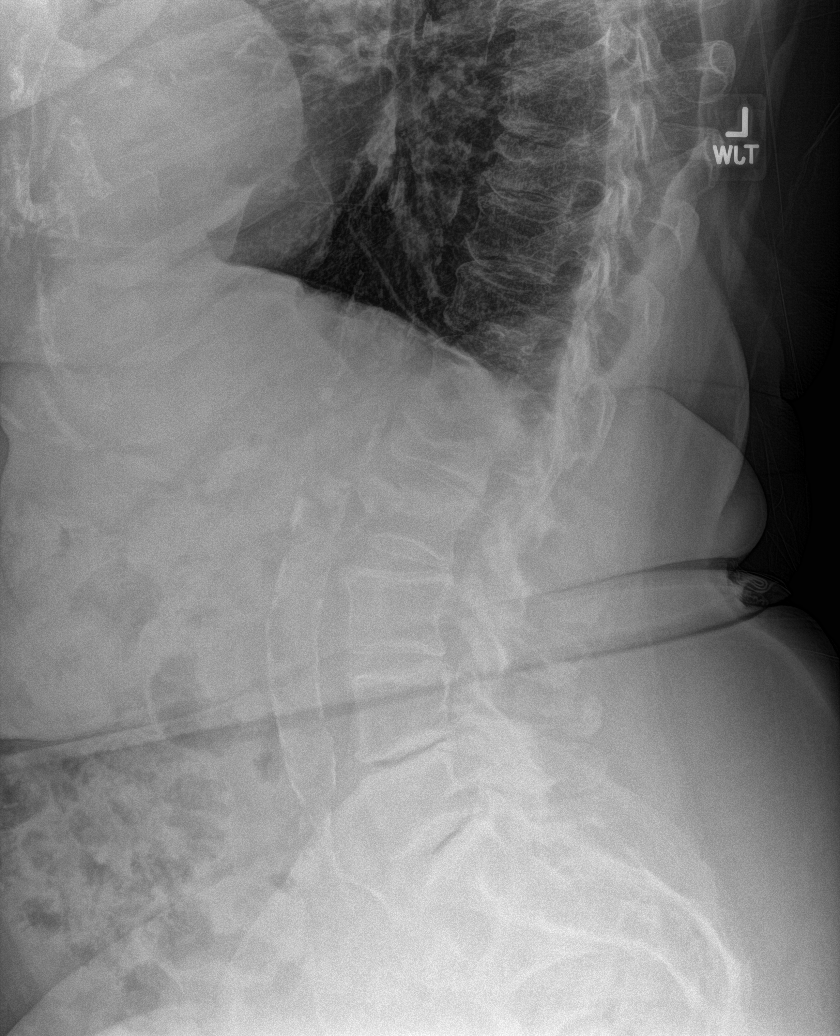

[l-spine l5/s1]
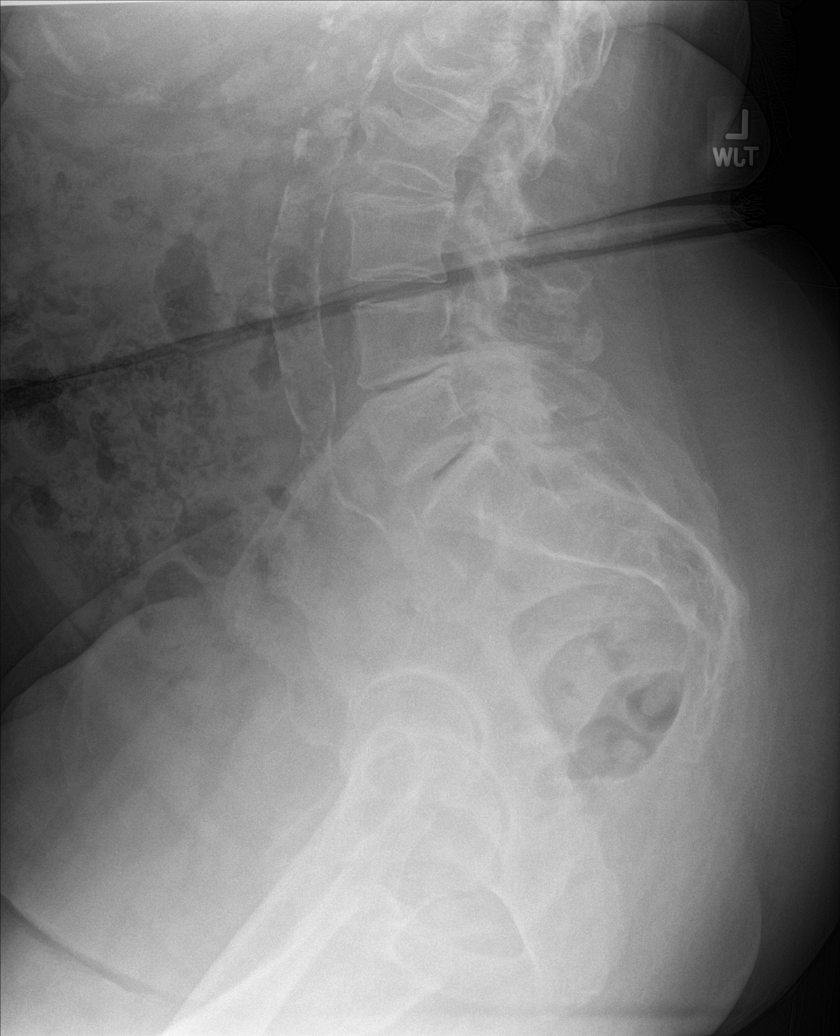

[5 of 5 positions shown; findings below may reference images not displayed]

FINDINGS: Lumbar spine numbered with the lowest segmented appearing lumbar
shaped vertebrae on lateral view as L5. Diffuse multilevel severe
degenerative change. Vacuum disc phenomena noted at L4-L5 and L5-S1.
Multiple mild thoracic compression fractures are noted. Prominent L1
compression fracture noted. Moderate L2 compression fracture noted.
Age of these compression fractures is undetermined. Aortoiliac
atherosclerotic vascular calcification. Pelvic calcifications
consistent phleboliths. Splenic calcifications, most likely
granulomas noted. Questionable faint calcifications in the right
upper quadrant. These could be within the liver, gallbladder, or
right kidney.
IMPRESSION: 1. Multilevel severe degenerative change lumbar spine. Multiple
thoracic spine mild compression fractures are noted. Prominent L1
compression fracture. Moderate L2 compression fracture. Age of these
compression fractures undetermined.

2.  Aortoiliac atherosclerotic vascular disease.

## 2017-11-25 NOTE — Assessment & Plan Note (Signed)
Advised her to increase Advair to BID as she is having SOB Will monitor

## 2017-11-25 NOTE — Progress Notes (Signed)
HPI:  Pt presents to the clinic today for her Medicare Wellness Exam. She is also due to follow up chronic conditions.  Arthritis: Multiple joints, mainly back, neck and shoulders. She does reports worsening back pain. She takes Ibuprofen as needed with good relief.  Asthma: Mild, persistent. She reports recent increased shortness of breath. Controlled with Advair daily with good relief. There are no PFT's on file.  HLD: Her last LDL was 106. She is not taking any cholesterol lowering medication at this time. She tries to consume a low fat diet.  HTN: Her BP today is 132/78. She is taking Losartan and Metoprolol as prescribed. ECG from 09/2016 reviewed.  Past Medical History:  Diagnosis Date  . Arthritis    back, neck; right shoulder;   . Asthma    using advair prn  . Chicken pox   . Hyperlipidemia   . Hypertension    controlled with medication;     Current Outpatient Medications  Medication Sig Dispense Refill  . ADVAIR DISKUS 250-50 MCG/DOSE AEPB TAKE 1 PUFF BY MOUTH TWICE A DAY **RINSE MOUTH AFTER USE** 180 each 0  . Cholecalciferol (VITAMIN D3) 2000 UNITS TABS Take 1 tablet by mouth daily.    Marland Kitchen ibuprofen (ADVIL,MOTRIN) 400 MG tablet Take 400 mg by mouth daily as needed.     Marland Kitchen losartan (COZAAR) 50 MG tablet Take 1 tablet (50 mg total) by mouth daily. MUST SCHEDULE ANNUAL EXAM 90 tablet 0  . metoprolol succinate (TOPROL-XL) 25 MG 24 hr tablet TAKE 1 TABLET (25 MG TOTAL) BY MOUTH DAILY **MUST SCHEDULE ANNUAL PHYSICAL** 30 tablet 0  . Multiple Minerals-Vitamins (CALCIUM CITRATE PLUS PO) Take 1 tablet by mouth daily.    . Omega-3 Fatty Acids (FISH OIL) 1000 MG CAPS Take 1 capsule by mouth daily.    . vitamin C (ASCORBIC ACID) 500 MG tablet Take 1,000 mg by mouth daily as needed.      No current facility-administered medications for this visit.     Allergies  Allergen Reactions  . Ace Inhibitors Cough    Other reaction(s): Cough  . Codeine Nausea Only    Family History   Problem Relation Age of Onset  . Heart disease Mother   . Heart disease Father   . Arthritis Maternal Grandmother   . Diabetes Neg Hx   . Cancer Neg Hx   . Stroke Neg Hx     Social History   Socioeconomic History  . Marital status: Widowed    Spouse name: Not on file  . Number of children: Not on file  . Years of education: Not on file  . Highest education level: Not on file  Occupational History  . Not on file  Social Needs  . Financial resource strain: Not on file  . Food insecurity:    Worry: Not on file    Inability: Not on file  . Transportation needs:    Medical: Not on file    Non-medical: Not on file  Tobacco Use  . Smoking status: Former Smoker    Last attempt to quit: 04/17/2009    Years since quitting: 8.6  . Smokeless tobacco: Never Used  Substance and Sexual Activity  . Alcohol use: Yes    Alcohol/week: 0.0 standard drinks    Comment: occasional  . Drug use: No  . Sexual activity: Never  Lifestyle  . Physical activity:    Days per week: Not on file    Minutes per session: Not on file  .  Stress: Not on file  Relationships  . Social connections:    Talks on phone: Not on file    Gets together: Not on file    Attends religious service: Not on file    Active member of club or organization: Not on file    Attends meetings of clubs or organizations: Not on file    Relationship status: Not on file  . Intimate partner violence:    Fear of current or ex partner: Not on file    Emotionally abused: Not on file    Physically abused: Not on file    Forced sexual activity: Not on file  Other Topics Concern  . Not on file  Social History Narrative  . Not on file    Hospitiliaztions: None  Health Maintenance:    Flu: 11/2017  Tetanus: unsure  Pneumovax: 10/2008  Prevnar: 06/2014  Zostavax: 11/2009  Shingrix: never  Mammogram: 10/2017  Pap Smear: no longer screening  Bone Density: 09/2015  Colon Screening: 09/2005  Eye Doctor: annually  Dental  Exam: biannually   Providers:   PCP: Webb Silversmith, NP-C    I have personally reviewed and have noted:  1. [name] medical and social history 2. Their use of alcohol, tobacco or illicit drugs 3. Their current medications and supplements 4. [name] functional ability including ADL's, fall risks, home safety risks and hearing or visual impairment. 5. Diet and physical activities 6. Evidence for depression or mood disorder  Subjective:   Review of Systems:   Constitutional: Denies fever, malaise, fatigue, headache or abrupt weight changes.  HEENT: Denies eye pain, eye redness, ear pain, ringing in the ears, wax buildup, runny nose, nasal congestion, bloody nose, or sore throat. Respiratory: Pt reports shortness of breath. Denies difficulty breathing, cough or sputum production.   Cardiovascular: Denies chest pain, chest tightness, palpitations or swelling in the hands or feet.  Gastrointestinal: Denies abdominal pain, bloating, constipation, diarrhea or blood in the stool.  GU: Denies urgency, frequency, pain with urination, burning sensation, blood in urine, odor or discharge. Musculoskeletal: Pt reports low back pain. Denies decrease in range of motion, difficulty with gait, muscle pain or joint  swelling.  Skin: Denies redness, rashes, lesions or ulcercations.  Neurological: Denies dizziness, difficulty with memory, difficulty with speech or problems with balance and coordination.  Psych: Denies anxiety, depression, SI/HI.  No other specific complaints in a complete review of systems (except as listed in HPI above).  Objective:  PE:   BP 132/78   Pulse 68   Temp 98.3 F (36.8 C) (Oral)   Ht 5' (1.524 m)   Wt 173 lb (78.5 kg)   SpO2 98%   BMI 33.79 kg/m   Wt Readings from Last 3 Encounters:  11/25/17 173 lb (78.5 kg)  07/10/17 171 lb (77.6 kg)  03/03/17 169 lb (76.7 kg)    General: Appears her stated age, in NAD. Skin: Warm, dry and intact.  HEENT:  Head: normal shape and size; Eyes: sclera white, no icterus, conjunctiva pink, PERRLA and EOMs intact; Ears: Tm's gray and intact, normal light reflex; Throat/Mouth: Teeth present, mucosa pink and moist, no exudate, lesions or ulcerations noted.  Neck: Neck supple, trachea midline. No masses, lumps or thyromegaly present.  Cardiovascular: Normal rate and rhythm. S1,S2 noted.  No murmur, rubs or gallops noted. No JVD or BLE edema. No carotid bruits noted. Pulmonary/Chest: Normal effort and positive vesicular breath sounds. No respiratory distress. No wheezes, rales or ronchi noted.  Abdomen:  Soft and nontender. Normal bowel sounds. No distention or masses noted. Liver, spleen and kidneys non palpable. Musculoskeletal: Strength 5/5 BUE/BLE. No signs of joint swelling.  Neurological: Alert and oriented. Cranial nerves II-XII grossly intact. Coordination normal.  Psychiatric: Mood and affect normal. Behavior is normal. Judgment and thought content normal.     BMET    Component Value Date/Time   NA 132 (L) 09/16/2016 1343   K 4.5 09/16/2016 1343   CL 98 09/16/2016 1343   CO2 27 09/16/2016 1343   GLUCOSE 94 09/16/2016 1343   BUN 15 09/16/2016 1343   CREATININE 0.59 09/16/2016 1343   CALCIUM 9.3 09/16/2016 1343    Lipid Panel     Component Value Date/Time   CHOL 188 09/16/2016 1343   TRIG 55.0 09/16/2016 1343   HDL 70.60 09/16/2016 1343   CHOLHDL 3 09/16/2016 1343   VLDL 11.0 09/16/2016 1343   LDLCALC 106 (H) 09/16/2016 1343    CBC    Component Value Date/Time   WBC 7.5 09/16/2016 1343   RBC 4.15 09/16/2016 1343   HGB 12.6 09/16/2016 1343   HCT 38.8 09/16/2016 1343   PLT 235.0 09/16/2016 1343   MCV 93.4 09/16/2016 1343   MCHC 32.4 09/16/2016 1343   RDW 13.9 09/16/2016 1343    Hgb A1C No results found for: HGBA1C    Assessment and Plan:   Medicare Annual Wellness Visit:  Diet: She does eat meat. She consumes more veggies than fruits. She tries to avoid fried foods.  She drinks mostly coffee, unsweet tea. Physical activity: Sedentary Depression/mood screen: Negative Hearing: Intact to whispered voice Visual acuity: Grossly normal, performs annual eye exam  ADLs: Capable Fall risk: None Home safety: Good Cognitive evaluation: Intact to orientation, naming, recall and repetition EOL planning: No adv directives, full code/ I agree  Preventative Medicine: Flu, prevnar, pneumovax, zostovak UTD. She declines tetanus. She will check with insurance company about shingrix. Mammogram and bone density UTD. She declines pelvic exam, colon cancer screening. Encouraged her to consume a balanced diet and exercise regimen. Advised him to see an eye doctor and dentist annually. Will check CBC, CMET, Lipid and Vit D.    Next appointment: 1 year, Medicare Wellness Exam   Webb Silversmith, NP

## 2017-11-25 NOTE — Assessment & Plan Note (Signed)
Controlled on Losartan and Metoprolol Reinforced DASH diet and exercise for weight loss CBC and CMET today

## 2017-11-25 NOTE — Patient Instructions (Signed)
Health Maintenance for Postmenopausal Women Menopause is a normal process in which your reproductive ability comes to an end. This process happens gradually over a span of months to years, usually between the ages of 22 and 9. Menopause is complete when you have missed 12 consecutive menstrual periods. It is important to talk with your health care provider about some of the most common conditions that affect postmenopausal women, such as heart disease, cancer, and bone loss (osteoporosis). Adopting a healthy lifestyle and getting preventive care can help to promote your health and wellness. Those actions can also lower your chances of developing some of these common conditions. What should I know about menopause? During menopause, you may experience a number of symptoms, such as:  Moderate-to-severe hot flashes.  Night sweats.  Decrease in sex drive.  Mood swings.  Headaches.  Tiredness.  Irritability.  Memory problems.  Insomnia.  Choosing to treat or not to treat menopausal changes is an individual decision that you make with your health care provider. What should I know about hormone replacement therapy and supplements? Hormone therapy products are effective for treating symptoms that are associated with menopause, such as hot flashes and night sweats. Hormone replacement carries certain risks, especially as you become older. If you are thinking about using estrogen or estrogen with progestin treatments, discuss the benefits and risks with your health care provider. What should I know about heart disease and stroke? Heart disease, heart attack, and stroke become more likely as you age. This may be due, in part, to the hormonal changes that your body experiences during menopause. These can affect how your body processes dietary fats, triglycerides, and cholesterol. Heart attack and stroke are both medical emergencies. There are many things that you can do to help prevent heart disease  and stroke:  Have your blood pressure checked at least every 1-2 years. High blood pressure causes heart disease and increases the risk of stroke.  If you are 53-22 years old, ask your health care provider if you should take aspirin to prevent a heart attack or a stroke.  Do not use any tobacco products, including cigarettes, chewing tobacco, or electronic cigarettes. If you need help quitting, ask your health care provider.  It is important to eat a healthy diet and maintain a healthy weight. ? Be sure to include plenty of vegetables, fruits, low-fat dairy products, and lean protein. ? Avoid eating foods that are high in solid fats, added sugars, or salt (sodium).  Get regular exercise. This is one of the most important things that you can do for your health. ? Try to exercise for at least 150 minutes each week. The type of exercise that you do should increase your heart rate and make you sweat. This is known as moderate-intensity exercise. ? Try to do strengthening exercises at least twice each week. Do these in addition to the moderate-intensity exercise.  Know your numbers.Ask your health care provider to check your cholesterol and your blood glucose. Continue to have your blood tested as directed by your health care provider.  What should I know about cancer screening? There are several types of cancer. Take the following steps to reduce your risk and to catch any cancer development as early as possible. Breast Cancer  Practice breast self-awareness. ? This means understanding how your breasts normally appear and feel. ? It also means doing regular breast self-exams. Let your health care provider know about any changes, no matter how small.  If you are 40  or older, have a clinician do a breast exam (clinical breast exam or CBE) every year. Depending on your age, family history, and medical history, it may be recommended that you also have a yearly breast X-ray (mammogram).  If you  have a family history of breast cancer, talk with your health care provider about genetic screening.  If you are at high risk for breast cancer, talk with your health care provider about having an MRI and a mammogram every year.  Breast cancer (BRCA) gene test is recommended for women who have family members with BRCA-related cancers. Results of the assessment will determine the need for genetic counseling and BRCA1 and for BRCA2 testing. BRCA-related cancers include these types: ? Breast. This occurs in males or females. ? Ovarian. ? Tubal. This may also be called fallopian tube cancer. ? Cancer of the abdominal or pelvic lining (peritoneal cancer). ? Prostate. ? Pancreatic.  Cervical, Uterine, and Ovarian Cancer Your health care provider may recommend that you be screened regularly for cancer of the pelvic organs. These include your ovaries, uterus, and vagina. This screening involves a pelvic exam, which includes checking for microscopic changes to the surface of your cervix (Pap test).  For women ages 21-65, health care providers may recommend a pelvic exam and a Pap test every three years. For women ages 79-65, they may recommend the Pap test and pelvic exam, combined with testing for human papilloma virus (HPV), every five years. Some types of HPV increase your risk of cervical cancer. Testing for HPV may also be done on women of any age who have unclear Pap test results.  Other health care providers may not recommend any screening for nonpregnant women who are considered low risk for pelvic cancer and have no symptoms. Ask your health care provider if a screening pelvic exam is right for you.  If you have had past treatment for cervical cancer or a condition that could lead to cancer, you need Pap tests and screening for cancer for at least 20 years after your treatment. If Pap tests have been discontinued for you, your risk factors (such as having a new sexual partner) need to be  reassessed to determine if you should start having screenings again. Some women have medical problems that increase the chance of getting cervical cancer. In these cases, your health care provider may recommend that you have screening and Pap tests more often.  If you have a family history of uterine cancer or ovarian cancer, talk with your health care provider about genetic screening.  If you have vaginal bleeding after reaching menopause, tell your health care provider.  There are currently no reliable tests available to screen for ovarian cancer.  Lung Cancer Lung cancer screening is recommended for adults 69-62 years old who are at high risk for lung cancer because of a history of smoking. A yearly low-dose CT scan of the lungs is recommended if you:  Currently smoke.  Have a history of at least 30 pack-years of smoking and you currently smoke or have quit within the past 15 years. A pack-year is smoking an average of one pack of cigarettes per day for one year.  Yearly screening should:  Continue until it has been 15 years since you quit.  Stop if you develop a health problem that would prevent you from having lung cancer treatment.  Colorectal Cancer  This type of cancer can be detected and can often be prevented.  Routine colorectal cancer screening usually begins at  age 42 and continues through age 45.  If you have risk factors for colon cancer, your health care provider may recommend that you be screened at an earlier age.  If you have a family history of colorectal cancer, talk with your health care provider about genetic screening.  Your health care provider may also recommend using home test kits to check for hidden blood in your stool.  A small camera at the end of a tube can be used to examine your colon directly (sigmoidoscopy or colonoscopy). This is done to check for the earliest forms of colorectal cancer.  Direct examination of the colon should be repeated every  5-10 years until age 71. However, if early forms of precancerous polyps or small growths are found or if you have a family history or genetic risk for colorectal cancer, you may need to be screened more often.  Skin Cancer  Check your skin from head to toe regularly.  Monitor any moles. Be sure to tell your health care provider: ? About any new moles or changes in moles, especially if there is a change in a mole's shape or color. ? If you have a mole that is larger than the size of a pencil eraser.  If any of your family members has a history of skin cancer, especially at a young age, talk with your health care provider about genetic screening.  Always use sunscreen. Apply sunscreen liberally and repeatedly throughout the day.  Whenever you are outside, protect yourself by wearing long sleeves, pants, a wide-brimmed hat, and sunglasses.  What should I know about osteoporosis? Osteoporosis is a condition in which bone destruction happens more quickly than new bone creation. After menopause, you may be at an increased risk for osteoporosis. To help prevent osteoporosis or the bone fractures that can happen because of osteoporosis, the following is recommended:  If you are 46-71 years old, get at least 1,000 mg of calcium and at least 600 mg of vitamin D per day.  If you are older than age 55 but younger than age 65, get at least 1,200 mg of calcium and at least 600 mg of vitamin D per day.  If you are older than age 54, get at least 1,200 mg of calcium and at least 800 mg of vitamin D per day.  Smoking and excessive alcohol intake increase the risk of osteoporosis. Eat foods that are rich in calcium and vitamin D, and do weight-bearing exercises several times each week as directed by your health care provider. What should I know about how menopause affects my mental health? Depression may occur at any age, but it is more common as you become older. Common symptoms of depression  include:  Low or sad mood.  Changes in sleep patterns.  Changes in appetite or eating patterns.  Feeling an overall lack of motivation or enjoyment of activities that you previously enjoyed.  Frequent crying spells.  Talk with your health care provider if you think that you are experiencing depression. What should I know about immunizations? It is important that you get and maintain your immunizations. These include:  Tetanus, diphtheria, and pertussis (Tdap) booster vaccine.  Influenza every year before the flu season begins.  Pneumonia vaccine.  Shingles vaccine.  Your health care provider may also recommend other immunizations. This information is not intended to replace advice given to you by your health care provider. Make sure you discuss any questions you have with your health care provider. Document Released: 03/15/2005  Document Revised: 08/11/2015 Document Reviewed: 10/25/2014 Elsevier Interactive Patient Education  2018 [employer]  

## 2017-11-25 NOTE — Assessment & Plan Note (Signed)
Will obtain xray lumbar spine Continue Ibuprofen for now She is interested in PT of some sort

## 2017-11-25 NOTE — Assessment & Plan Note (Signed)
CMET and Lipid profile today Encouraged her to consume a low fat diet Will monitor

## 2017-11-27 ENCOUNTER — Telehealth: Payer: Self-pay | Admitting: Internal Medicine

## 2017-11-27 MED ORDER — LOSARTAN POTASSIUM 50 MG PO TABS: 50.0000 mg | ORAL_TABLET | Freq: Every day | ORAL | 2 refills | Status: DC

## 2017-11-27 MED ORDER — METOPROLOL SUCCINATE ER 25 MG PO TB24: ORAL_TABLET | ORAL | 2 refills | Status: DC

## 2017-11-27 NOTE — Telephone Encounter (Signed)
Rx sent through e-scribe  

## 2017-11-27 NOTE — Telephone Encounter (Signed)
Pt went to pick up prescriptions for losartan and metoprolol from pharmacy. She's wondering if she can have 90 days called in instead of the 30.

## 2017-12-02 ENCOUNTER — Other Ambulatory Visit: Payer: Self-pay | Admitting: Internal Medicine

## 2017-12-02 DIAGNOSIS — M545 Low back pain, unspecified: Secondary | ICD-10-CM

## 2017-12-02 DIAGNOSIS — G8929 Other chronic pain: Secondary | ICD-10-CM

## 2017-12-02 DIAGNOSIS — S32000D Wedge compression fracture of unspecified lumbar vertebra, subsequent encounter for fracture with routine healing: Secondary | ICD-10-CM

## 2017-12-02 DIAGNOSIS — M810 Age-related osteoporosis without current pathological fracture: Secondary | ICD-10-CM

## 2017-12-02 DIAGNOSIS — Z780 Asymptomatic menopausal state: Secondary | ICD-10-CM

## 2017-12-02 NOTE — Progress Notes (Signed)
bno

## 2017-12-04 ENCOUNTER — Telehealth: Payer: Self-pay

## 2017-12-04 NOTE — Telephone Encounter (Signed)
Left message for patient to call back in regards to a referral-Fard Borunda V Kristl Morioka, RMA   

## 2017-12-05 ENCOUNTER — Telehealth: Payer: Self-pay

## 2017-12-05 NOTE — Telephone Encounter (Signed)
Spoke with patient about referral to orthopedics for osteoporosis that she requested per notes. Patient states she was questioning if she should go to orthopedics for this and not necessarily requesting this. Wants to know providers opinion on this if she needs to go to one or get BMD repeated and to try medications first? Also she has not heard back about Prolia and if she can try that route? Patient would like to know what her steps should be in all of this. Please review. Referral is on hold. Thank Edrick Kins, RMA

## 2017-12-05 NOTE — Telephone Encounter (Signed)
I  Don't think she needs to see a orthopedist about her osteoporosis. I think we should repeat bone density, which I believe I already ordered, see the results and discuss treatment. I know she wants Prolia, Earl Lagos is aware, but she reports it will take 2-3 weeks to get her set up on this. In the meantime, she should be getting 30 minutes of weight bearing exercise daily, taking 5000 units of Vit D and 1200 mg of Calcium daily.

## 2017-12-09 NOTE — Telephone Encounter (Signed)
Left message on voicemail.

## 2017-12-09 NOTE — Telephone Encounter (Signed)
Pt called she has questions about prolia and bone density and meds Pt wanted to know if it is ok for her to wait till 9/20 to have her bone density.  That way she can have her mammogram and bone density at same time  Best number [phone]

## 2017-12-16 ENCOUNTER — Encounter

## 2017-12-16 ENCOUNTER — Ambulatory Visit: Payer: Medicare Other | Admitting: Internal Medicine

## 2017-12-16 ENCOUNTER — Telehealth: Payer: Self-pay | Admitting: *Deleted

## 2017-12-16 NOTE — Telephone Encounter (Signed)
Information has been submitted to pts insurance for verification of benefits. Awaiting response for coverage  

## 2017-12-24 ENCOUNTER — Telehealth: Payer: Self-pay

## 2017-12-24 ENCOUNTER — Other Ambulatory Visit: Payer: Self-pay

## 2017-12-24 NOTE — Telephone Encounter (Signed)
Patent called to check status of her Prolia injection.  Waynetta oversees prolia and she was able to validate that info was sent in to insurance company for benefit processing on 12/16/17.  We are just waiting on benefits/payment clarification.  Waynetta aware and will be monitoring response form insurance.  She will call patient once confirmation of benefits has been received.  Patient made aware and verbalizes understanding.

## 2017-12-26 DIAGNOSIS — H18413 Arcus senilis, bilateral: Secondary | ICD-10-CM | POA: Diagnosis not present

## 2017-12-26 DIAGNOSIS — H02839 Dermatochalasis of unspecified eye, unspecified eyelid: Secondary | ICD-10-CM | POA: Diagnosis not present

## 2017-12-26 DIAGNOSIS — H2513 Age-related nuclear cataract, bilateral: Secondary | ICD-10-CM | POA: Diagnosis not present

## 2018-01-06 ENCOUNTER — Other Ambulatory Visit: Payer: Self-pay | Admitting: Internal Medicine

## 2018-01-06 ENCOUNTER — Other Ambulatory Visit (INDEPENDENT_AMBULATORY_CARE_PROVIDER_SITE_OTHER): Payer: Medicare Other

## 2018-01-06 DIAGNOSIS — M810 Age-related osteoporosis without current pathological fracture: Secondary | ICD-10-CM | POA: Diagnosis not present

## 2018-01-06 LAB — CALCIUM: CALCIUM: 9.1 mg/dL (ref 8.4–10.5)

## 2018-01-06 NOTE — Telephone Encounter (Signed)
Verification of benefits have been processed and an approval has been received for pts prolia injection. Pts estimated cost are appx $0. This is only an estimate and cannot be confirmed until benefits are paid. Please advise pt and schedule if needed. If scheduled, once the injection is received, pls contact me back with the date it was received so that I am able to update prolia folder. thanks  

## 2018-01-06 NOTE — Telephone Encounter (Signed)
Spoke to pt and advised. Ca lab and prolia injection scheduled.

## 2018-01-06 NOTE — Telephone Encounter (Signed)
Lm on pts vm requesting a call back 

## 2018-01-09 ENCOUNTER — Telehealth: Payer: Self-pay | Admitting: Internal Medicine

## 2018-01-09 NOTE — Telephone Encounter (Signed)
Opened in error

## 2018-01-14 ENCOUNTER — Ambulatory Visit (INDEPENDENT_AMBULATORY_CARE_PROVIDER_SITE_OTHER): Payer: Medicare Other | Admitting: *Deleted

## 2018-01-14 DIAGNOSIS — M810 Age-related osteoporosis without current pathological fracture: Secondary | ICD-10-CM

## 2018-01-14 MED ORDER — DENOSUMAB 60 MG/ML ~~LOC~~ SOSY
60.0000 mg | PREFILLED_SYRINGE | Freq: Once | SUBCUTANEOUS | Status: AC
  Administered 2018-01-14: 60 mg via SUBCUTANEOUS

## 2018-01-14 NOTE — Progress Notes (Signed)
Per orders of Dr. Danise Mina, injection of prolia given by Modena Nunnery. Patient tolerated injection well.

## 2018-01-15 ENCOUNTER — Other Ambulatory Visit: Payer: Self-pay | Admitting: Internal Medicine

## 2018-01-15 DIAGNOSIS — H16223 Keratoconjunctivitis sicca, not specified as Sjogren's, bilateral: Secondary | ICD-10-CM | POA: Diagnosis not present

## 2018-01-15 DIAGNOSIS — H02054 Trichiasis without entropian left upper eyelid: Secondary | ICD-10-CM | POA: Diagnosis not present

## 2018-01-29 ENCOUNTER — Telehealth: Payer: Self-pay

## 2018-01-29 NOTE — Progress Notes (Signed)
BP 140/72 (BP Location: Left Arm, Patient Position: Sitting, Cuff Size: Normal)   Pulse 80   Temp 98.2 F (36.8 C) (Oral)   Ht 5' (1.524 m)   Wt 171 lb 8 oz (77.8 kg)   SpO2 98%   BMI 33.49 kg/m    CC: cough/congestion Subjective:    Patient ID: Lacey Brown, female    DOB: May 03, 1940, 77 y.o.   MRN: [id number]  HPI: Lacey Brown is a 77 y.o. female presenting on 01/30/2018 for Cough (C/o productive cough- with green mucous, chest congestion, hoarsness and runny nose. Sxs started 01/24/18. Tried Mucinex, helpful. )    6 d h/o rhinorrhea that has progressed to productive cough, head > chest congestion. PNDrainage. Some dyspnea when supine. Some wheezing.   No fevers/chills, ear or tooth pain, headache, ST.   Treating with mucinex with benefit.  No sick contacts at home.  No smokers at home.   H/o asthma, treats with advair PRN. Has been using more recently with illness. Last asthma flare was 20 yrs ago.      Relevant past medical, surgical, family and social history reviewed and updated as indicated. Interim medical history since our last visit reviewed. Allergies and medications reviewed and updated. Outpatient Medications Prior to Visit  Medication Sig Dispense Refill  . ADVAIR DISKUS 250-50 MCG/DOSE AEPB TAKE 1 PUFF BY MOUTH TWICE A DAY **RINSE MOUTH AFTER USE** 180 each 2  . Cholecalciferol (VITAMIN D3) 2000 UNITS TABS Take 1 tablet by mouth daily.    Marland Kitchen ibuprofen (ADVIL,MOTRIN) 400 MG tablet Take 400 mg by mouth daily as needed.     Marland Kitchen losartan (COZAAR) 50 MG tablet Take 1 tablet (50 mg total) by mouth daily. 90 tablet 2  . metoprolol succinate (TOPROL-XL) 25 MG 24 hr tablet TAKE 1 TABLET (25 MG TOTAL) BY MOUTH DAILY 90 tablet 2  . Multiple Minerals-Vitamins (CALCIUM CITRATE PLUS PO) Take 1 tablet by mouth daily.    . Omega-3 Fatty Acids (FISH OIL) 1000 MG CAPS Take 1 capsule by mouth daily.    . vitamin C (ASCORBIC ACID) 500 MG tablet Take 1,000 mg by mouth  daily as needed.      No facility-administered medications prior to visit.      Per HPI unless specifically indicated in ROS section below Review of Systems Objective:    BP 140/72 (BP Location: Left Arm, Patient Position: Sitting, Cuff Size: Normal)   Pulse 80   Temp 98.2 F (36.8 C) (Oral)   Ht 5' (1.524 m)   Wt 171 lb 8 oz (77.8 kg)   SpO2 98%   BMI 33.49 kg/m   Wt Readings from Last 3 Encounters:  01/30/18 171 lb 8 oz (77.8 kg)  11/25/17 173 lb (78.5 kg)  07/10/17 171 lb (77.6 kg)    Physical Exam Vitals signs and nursing note reviewed.  Constitutional:      General: She is not in acute distress.    Appearance: She is well-developed.  HENT:     Head: Normocephalic and atraumatic.     Right Ear: Hearing, tympanic membrane, ear canal and external ear normal.     Left Ear: Hearing, tympanic membrane, ear canal and external ear normal.     Nose: Mucosal edema (nasal mucosal congestion) and rhinorrhea present.     Right Sinus: No maxillary sinus tenderness or frontal sinus tenderness.     Left Sinus: No maxillary sinus tenderness or frontal sinus tenderness.     Mouth/Throat:  Pharynx: Uvula midline. No oropharyngeal exudate or posterior oropharyngeal erythema.     Tonsils: No tonsillar abscesses.  Eyes:     General: No scleral icterus.    Conjunctiva/sclera: Conjunctivae normal.     Pupils: Pupils are equal, round, and reactive to light.  Neck:     Musculoskeletal: Normal range of motion and neck supple.  Cardiovascular:     Rate and Rhythm: Normal rate and regular rhythm.     Heart sounds: Normal heart sounds. No murmur.  Pulmonary:     Effort: Pulmonary effort is normal. No respiratory distress.     Breath sounds: Decreased air movement (tight throughout) present. Decreased breath sounds and rhonchi (diffuse bilateral) present. No wheezing or rales.  Lymphadenopathy:     Cervical: No cervical adenopathy.  Skin:    Findings: No rash.    After albuterol neb  - improved air movement, cleared rhonchi, no rales or significant wheezing.      Assessment & Plan:   Problem List Items Addressed This Visit    Asthma exacerbation - Primary    Mild - Rx albuterol inhaler, prednisone taper. WASP for zpack provided, with indications when to fill. Continue mucinex.       Relevant Medications   albuterol (PROVENTIL) (2.5 MG/3ML) 0.083% nebulizer solution 2.5 mg (Completed)   albuterol (PROVENTIL HFA;VENTOLIN HFA) 108 (90 Base) MCG/ACT inhaler   predniSONE (DELTASONE) 20 MG tablet    Other Visit Diagnoses    Cough       Relevant Medications   albuterol (PROVENTIL) (2.5 MG/3ML) 0.083% nebulizer solution 2.5 mg (Completed)   Acute respiratory infection       Relevant Medications   azithromycin (ZITHROMAX) 250 MG tablet       Meds ordered this encounter  Medications  . albuterol (PROVENTIL) (2.5 MG/3ML) 0.083% nebulizer solution 2.5 mg  . albuterol (PROVENTIL HFA;VENTOLIN HFA) 108 (90 Base) MCG/ACT inhaler    Sig: Inhale 2 puffs into the lungs every 6 (six) hours as needed for wheezing or shortness of breath.    Dispense:  1 Inhaler    Refill:  0  . predniSONE (DELTASONE) 20 MG tablet    Sig: Take two tablets daily for 3 days followed by one tablet daily for 3 days    Dispense:  9 tablet    Refill:  0  . azithromycin (ZITHROMAX) 250 MG tablet    Sig: Take two tablets on day one followed by one tablet on days 2-5    Dispense:  6 each    Refill:  0   No orders of the defined types were placed in this encounter.  Patient Instructions  Albuterol treatment today.  I think you have mild asthma flare in setting of a respiratory infection.  Treat with short prednisone taper and albuterol inhaler to use as needed for cough or shortness of breath.  zpack antibiotic printed out - fill if fever >101, worsening productive cough or not improving with above.     Follow up plan: No follow-ups on file.  Lacey Bush, MD

## 2018-01-29 NOTE — Telephone Encounter (Signed)
Noted  

## 2018-01-29 NOTE — Telephone Encounter (Signed)
Pt calling for appt;prod cough with green phlegm, head congested since first of week. pt has been taking mucinex OTC with no real relief. No SOB, wheezing or fever. Pt scheduled appt to see Dr Danise Mina on 01/30/18 at 10:15. Pt was offered appt at different [facility] but pt declined.FYI to Dr Danise Mina.

## 2018-01-30 ENCOUNTER — Encounter: Payer: Self-pay | Admitting: Family Medicine

## 2018-01-30 ENCOUNTER — Ambulatory Visit (INDEPENDENT_AMBULATORY_CARE_PROVIDER_SITE_OTHER): Payer: Medicare Other | Admitting: Family Medicine

## 2018-01-30 VITALS — BP 140/72 | HR 80 | Temp 98.2°F | Ht 60.0 in | Wt 171.5 lb

## 2018-01-30 DIAGNOSIS — R05 Cough: Secondary | ICD-10-CM

## 2018-01-30 DIAGNOSIS — J45901 Unspecified asthma with (acute) exacerbation: Secondary | ICD-10-CM | POA: Insufficient documentation

## 2018-01-30 DIAGNOSIS — J22 Unspecified acute lower respiratory infection: Secondary | ICD-10-CM | POA: Diagnosis not present

## 2018-01-30 DIAGNOSIS — J4521 Mild intermittent asthma with (acute) exacerbation: Secondary | ICD-10-CM

## 2018-01-30 DIAGNOSIS — R059 Cough, unspecified: Secondary | ICD-10-CM

## 2018-01-30 MED ORDER — ALBUTEROL SULFATE (2.5 MG/3ML) 0.083% IN NEBU
2.5000 mg | INHALATION_SOLUTION | Freq: Once | RESPIRATORY_TRACT | Status: AC
  Administered 2018-01-30: 2.5 mg via RESPIRATORY_TRACT

## 2018-01-30 MED ORDER — ALBUTEROL SULFATE HFA 108 (90 BASE) MCG/ACT IN AERS: 2.0000 | INHALATION_SPRAY | Freq: Four times a day (QID) | RESPIRATORY_TRACT | 0 refills | Status: DC | PRN

## 2018-01-30 MED ORDER — PREDNISONE 20 MG PO TABS: ORAL_TABLET | ORAL | 0 refills | Status: DC

## 2018-01-30 MED ORDER — AZITHROMYCIN 250 MG PO TABS: ORAL_TABLET | ORAL | 0 refills | Status: DC

## 2018-01-30 NOTE — Assessment & Plan Note (Signed)
Mild - Rx albuterol inhaler, prednisone taper. WASP for zpack provided, with indications when to fill. Continue mucinex.

## 2018-01-30 NOTE — Patient Instructions (Addendum)
Albuterol treatment today.  I think you have mild asthma flare in setting of a respiratory infection.  Treat with short prednisone taper and albuterol inhaler to use as needed for cough or shortness of breath.  zpack antibiotic printed out - fill if fever >101, worsening productive cough or not improving with above.

## 2018-02-19 ENCOUNTER — Other Ambulatory Visit: Payer: Self-pay | Admitting: Family Medicine

## 2018-03-09 ENCOUNTER — Telehealth: Payer: Self-pay | Admitting: *Deleted

## 2018-03-09 NOTE — Telephone Encounter (Signed)
Spoke to pt who states [facility] is currently out of Losartan 50mg , but 25 and 100mg  are available. Pt is requesting a new Rx be sent to [place], and she prefers 2 25mg  tabs. pls advise

## 2018-03-11 ENCOUNTER — Ambulatory Visit: Payer: Self-pay | Admitting: *Deleted

## 2018-03-11 NOTE — Telephone Encounter (Addendum)
Contacted by Charlcie Cradle for nurse triage; pt called stating that she was prescribed ventolin inhaler and advair when she was in the office 01/30/18; she has questions about how to use medications; explained to pt that advair is her maintenance inahler that is to be used twice daily as ordered) and ventolin is her rescue inhaler to be used for shortness of breath or wheezing; the pt further elaborates that she states that she has intermittent chest tightness/shortness of breath in the morning with exertion; the pt says that these symptoms occur primarily in the morning; she is also concerned that maybe she has a heart problem because she has family members who had massive heart attacks; recommendations made per nurse triage protocol; the pt has to work on 03/12/2018 and 03/13/2018; pt offered and accepted appointment with Webb Silversmith, [place], 03/16/2018 at 1000; she verbalized understanding; will route to office for notification Reason for Disposition . [1] MODERATE longstanding difficulty breathing (e.g., speaks in phrases, SOB even at rest, pulse 100-120) AND [2] SAME as normal  Answer Assessment - Initial Assessment Questions 1. RESPIRATORY STATUS: "Describe your breathing?" (e.g., wheezing, shortness of breath, unable to speak, severe coughing)      Short of breath with exertion; Chest tigtness 2. ONSET: "When did this breathing problem begin?"      At least 6 months ago 3. PATTERN "Does the difficult breathing come and go, or has it been constant since it started?"      intermittent 4. SEVERITY: "How bad is your breathing?" (e.g., mild, moderate, severe)    - MILD: No SOB at rest, mild SOB with walking, speaks normally in sentences, can lay down, no retractions, pulse < 100.    - MODERATE: SOB at rest, SOB with minimal exertion and prefers to sit, cannot lie down flat, speaks in phrases, mild retractions, audible wheezing, pulse 100-120.    - SEVERE: Very SOB at rest, speaks in  single words, struggling to breathe, sitting hunched forward, retractions, pulse > 120      mild 5. RECURRENT SYMPTOM: "Have you had difficulty breathing before?" If so, ask: "When was the last time?" and "What happened that time?"      no 6. CARDIAC HISTORY: "Do you have any history of heart disease?" (e.g., heart attack, angina, bypass surgery, angioplasty)      Family cardiac history 7. LUNG HISTORY: "Do you have any history of lung disease?"  (e.g., pulmonary embolus, asthma, emphysema)     yes 8. CAUSE: "What do you think is causing the breathing problem?"      Not sure; thinks it might be her heart 9. OTHER SYMPTOMS: "Do you have any other symptoms? (e.g., dizziness, runny nose, cough, chest pain, fever)     no 10. PREGNANCY: "Is there any chance you are pregnant?" "When was your last menstrual period?"       no 11. TRAVEL: "Have you traveled out of the country in the last month?" (e.g., travel history, exposures)       no  Protocols used: BREATHING DIFFICULTY-A-AH

## 2018-03-16 ENCOUNTER — Encounter: Payer: Self-pay | Admitting: Internal Medicine

## 2018-03-16 ENCOUNTER — Ambulatory Visit (INDEPENDENT_AMBULATORY_CARE_PROVIDER_SITE_OTHER): Payer: Medicare Other | Admitting: Internal Medicine

## 2018-03-16 VITALS — BP 142/84 | HR 68 | Temp 98.0°F | Wt 173.0 lb

## 2018-03-16 DIAGNOSIS — R0609 Other forms of dyspnea: Secondary | ICD-10-CM

## 2018-03-16 DIAGNOSIS — R0789 Other chest pain: Secondary | ICD-10-CM

## 2018-03-16 NOTE — Patient Instructions (Signed)
Shortness of Breath, Adult  Shortness of breath means you have trouble breathing. Shortness of breath could be a sign of a medical problem.  Follow these instructions at home:     Watch for any changes in your symptoms.   Do not use any products that contain nicotine or tobacco, such as cigarettes, e-cigarettes, and chewing tobacco.   Do not smoke. Smoking can cause shortness of breath. If you need help to quit smoking, ask your doctor.   Avoid things that can make it harder to breathe, such as:  ? Mold.  ? Dust.  ? Air pollution.  ? Chemical smells.  ? Things that can cause allergy symptoms (allergens), if you have allergies.   Keep your living space clean. Use products that help remove mold and dust.   Rest as needed. Slowly return to your normal activities.   Take over-the-counter and prescription medicines only as told by your doctor. This includes oxygen therapy and inhaled medicines.   Keep all follow-up visits as told by your doctor. This is important.  Contact a doctor if:   Your condition does not get better as soon as expected.   You have a hard time doing your normal activities, even after you rest.   You have new symptoms.  Get help right away if:   Your shortness of breath gets worse.   You have trouble breathing when you are resting.   You feel light-headed or you pass out (faint).   You have a cough that is not helped by medicines.   You cough up blood.   You have pain with breathing.   You have pain in your chest, arms, shoulders, or belly (abdomen).   You have a fever.   You cannot walk up stairs.   You cannot exercise the way you normally do.  These symptoms may represent a serious problem that is an emergency. Do not wait to see if the symptoms will go away. Get medical help right away. Call your local emergency services (911 in the [place] Do not drive yourself to the hospital.  Summary   Shortness of breath is when you have trouble breathing enough air. It can be a sign of a  medical problem.   Avoid things that make it hard for you to breathe, such as smoking, pollution, mold, and dust.   Watch for any changes in your symptoms. Contact your doctor if you do not get better or you get worse.  This information is not intended to replace advice given to you by your health care provider. Make sure you discuss any questions you have with your health care provider.  Document Released: 07/10/2007 Document Revised: 06/23/2017 Document Reviewed: 06/23/2017  Elsevier Interactive Patient Education  2019 [employer] Inc[employer]

## 2018-03-16 NOTE — Progress Notes (Signed)
Subjective:    Patient ID: Lacey Brown, female    DOB: [birth year], 78 y.o.   MRN: [id number]  HPI  Pt presents to the clinic today with c/o chest pressure, chest tightness and shortness of breath with exertion.. She reports this started about 6 months. It only occurs with exertion, not at rest. It is relieved with rest. Her BP today is 142/84, she is taking Losartan and Metoprolol as prescribed. Her LDL is 107, without statin therapy. She is not taking a baby ASA.   Review of Systems      Past Medical History:  Diagnosis Date  . Arthritis    back, neck; right shoulder;   . Asthma    using advair prn  . Chicken pox   . Hyperlipidemia   . Hypertension    controlled with medication;     Current Outpatient Medications  Medication Sig Dispense Refill  . ADVAIR DISKUS 250-50 MCG/DOSE AEPB TAKE 1 PUFF BY MOUTH TWICE A DAY **RINSE MOUTH AFTER USE** 180 each 2  . azithromycin (ZITHROMAX) 250 MG tablet Take two tablets on day one followed by one tablet on days 2-5 6 each 0  . Cholecalciferol (VITAMIN D3) 2000 UNITS TABS Take 1 tablet by mouth daily.    Marland Kitchen ibuprofen (ADVIL,MOTRIN) 400 MG tablet Take 400 mg by mouth daily as needed.     Marland Kitchen losartan (COZAAR) 50 MG tablet Take 1 tablet (50 mg total) by mouth daily. 90 tablet 2  . metoprolol succinate (TOPROL-XL) 25 MG 24 hr tablet TAKE 1 TABLET (25 MG TOTAL) BY MOUTH DAILY 90 tablet 2  . Multiple Minerals-Vitamins (CALCIUM CITRATE PLUS PO) Take 1 tablet by mouth daily.    . Omega-3 Fatty Acids (FISH OIL) 1000 MG CAPS Take 1 capsule by mouth daily.    . predniSONE (DELTASONE) 20 MG tablet Take two tablets daily for 3 days followed by one tablet daily for 3 days 9 tablet 0  . VENTOLIN HFA 108 (90 Base) MCG/ACT inhaler TAKE 2 PUFFS BY MOUTH EVERY 6 HOURS AS NEEDED FOR WHEEZE OR SHORTNESS OF BREATH 1 Inhaler 2  . vitamin C (ASCORBIC ACID) 500 MG tablet Take 1,000 mg by mouth daily as needed.      No current facility-administered medications  for this visit.     Allergies  Allergen Reactions  . Ace Inhibitors Cough    Other reaction(s): Cough  . Codeine Nausea Only    Family History  Problem Relation Age of Onset  . Heart disease Mother   . Heart disease Father   . Arthritis Maternal Grandmother   . Diabetes Neg Hx   . Cancer Neg Hx   . Stroke Neg Hx     Social History   Socioeconomic History  . Marital status: Widowed    Spouse name: Not on file  . Number of children: Not on file  . Years of education: Not on file  . Highest education level: Not on file  Occupational History  . Not on file  Social Needs  . Financial resource strain: Not on file  . Food insecurity:    Worry: Not on file    Inability: Not on file  . Transportation needs:    Medical: Not on file    Non-medical: Not on file  Tobacco Use  . Smoking status: Former Smoker    Last attempt to quit: 04/17/2009    Years since quitting: 8.9  . Smokeless tobacco: Never Used  Substance and Sexual Activity  .  Alcohol use: Yes    Alcohol/week: 0.0 standard drinks    Comment: occasional  . Drug use: No  . Sexual activity: Never  Lifestyle  . Physical activity:    Days per week: Not on file    Minutes per session: Not on file  . Stress: Not on file  Relationships  . Social connections:    Talks on phone: Not on file    Gets together: Not on file    Attends religious service: Not on file    Active member of club or organization: Not on file    Attends meetings of clubs or organizations: Not on file    Relationship status: Not on file  . Intimate partner violence:    Fear of current or ex partner: Not on file    Emotionally abused: Not on file    Physically abused: Not on file    Forced sexual activity: Not on file  Other Topics Concern  . Not on file  Social History Narrative  . Not on file     Constitutional: Denies fever, malaise, fatigue, headache or abrupt weight changes.  HEENT: Denies eye pain, eye redness, ear pain, ringing  in the ears, wax buildup, runny nose, nasal congestion, bloody nose, or sore throat. Respiratory: Pt reports intermittent cough, DOE. Denies difficulty breathing, cough or sputum production.   Cardiovascular: Pt reports chest tightness and pressure. Denies chest pain,  palpitations or swelling in the hands or feet.  Gastrointestinal: Denies abdominal pain, bloating, constipation, diarrhea or blood in the stool.  Neurological: Denies dizziness, difficulty with memory, difficulty with speech or problems with balance and coordination.    No other specific complaints in a complete review of systems (except as listed in HPI above).  Objective:   Physical Exam   BP (!) 142/84   Pulse 68   Temp 98 F (36.7 C) (Oral)   Wt 173 lb (78.5 kg)   SpO2 99%   BMI 33.79 kg/m  Wt Readings from Last 3 Encounters:  03/16/18 173 lb (78.5 kg)  01/30/18 171 lb 8 oz (77.8 kg)  11/25/17 173 lb (78.5 kg)    General: Appears her stated age, obese, in NAD. Skin: Warm, dry and intact. No rashes noted. Neck:  Neck supple, trachea midline. No masses, lumps or thyromegaly present.  Cardiovascular: Normal rate and rhythm. S1,S2 noted.  No murmur, rubs or gallops noted. No JVD or BLE edema. No carotid bruits noted. Pulmonary/Chest: Normal effort and positive vesicular breath sounds. No respiratory distress. No wheezes, rales or ronchi noted.  Musculoskeletal: Chest wall nontender with palpation.  Neurological: Alert and oriented.   BMET    Component Value Date/Time   NA 133 (L) 11/25/2017 1205   K 4.5 11/25/2017 1205   CL 99 11/25/2017 1205   CO2 30 11/25/2017 1205   GLUCOSE 93 11/25/2017 1205   BUN 18 11/25/2017 1205   CREATININE 0.79 11/25/2017 1205   CALCIUM 9.1 01/06/2018 1423    Lipid Panel     Component Value Date/Time   CHOL 191 11/25/2017 1205   TRIG 69.0 11/25/2017 1205   HDL 70.80 11/25/2017 1205   CHOLHDL 3 11/25/2017 1205   VLDL 13.8 11/25/2017 1205   LDLCALC 107 (H) 11/25/2017  1205    CBC    Component Value Date/Time   WBC 7.4 11/25/2017 1205   RBC 4.15 11/25/2017 1205   HGB 12.8 11/25/2017 1205   HCT 37.8 11/25/2017 1205   PLT 224.0 11/25/2017 1205  MCV 91.1 11/25/2017 1205   MCHC 33.8 11/25/2017 1205   RDW 13.2 11/25/2017 1205    Hgb A1C No results found for: HGBA1C         Assessment & Plan:   Chest Tightness, Chest Pressure, Dyspnea on Exertion:  Indication for ECG: chest tightness, chest pressure, dyspnea on exertion Interpretation: normal rate, rhythm. Normal QTC, no ST depression, elevation or inverted  T waves. Comparison: 09/2016 Discussed increasing Losartan but she is hesitant. She feels like BP is elevated because she is anxious about what is going on with her heart. Referral to cardiology placed for stress test  Return/ER precautions discussed Webb Silversmith, NP

## 2018-03-31 DIAGNOSIS — H02051 Trichiasis without entropian right upper eyelid: Secondary | ICD-10-CM | POA: Diagnosis not present

## 2018-03-31 DIAGNOSIS — H02054 Trichiasis without entropian left upper eyelid: Secondary | ICD-10-CM | POA: Diagnosis not present

## 2018-04-03 ENCOUNTER — Ambulatory Visit (INDEPENDENT_AMBULATORY_CARE_PROVIDER_SITE_OTHER): Payer: Medicare Other | Admitting: Family Medicine

## 2018-04-03 ENCOUNTER — Encounter: Payer: Self-pay | Admitting: Family Medicine

## 2018-04-03 ENCOUNTER — Telehealth: Payer: Self-pay

## 2018-04-03 VITALS — BP 134/78 | HR 73 | Temp 98.2°F | Ht 60.0 in | Wt 170.4 lb

## 2018-04-03 DIAGNOSIS — J4531 Mild persistent asthma with (acute) exacerbation: Secondary | ICD-10-CM | POA: Diagnosis not present

## 2018-04-03 MED ORDER — PREDNISONE 20 MG PO TABS: 40.0000 mg | ORAL_TABLET | Freq: Every day | ORAL | 0 refills | Status: DC

## 2018-04-03 NOTE — Patient Instructions (Signed)
I think you have asthma flare. Treat with short prednisone course. Start taking advair 1 puff twice daily. Schedule albuterol rescue inhaler 2 puffs three times daily for 3 days then just as needed.  Let us know if fever , worsening productive cough, or shortness of breath not improving.

## 2018-04-03 NOTE — Telephone Encounter (Signed)
Pt has wheeze with chest congestion; pt states she is not having difficulty breathing to the point she cannot get her breath. Pt says she just needs an abx. Pt scheduled appt with Dr Darnell Level 04/03/18 at 11:15. If pt condition changes or worsens prior to appt pt will go to ED. FYI to Dr Darnell Level.

## 2018-04-03 NOTE — Assessment & Plan Note (Signed)
rec advair 1 puff BID scheduled.

## 2018-04-03 NOTE — Progress Notes (Signed)
BP 134/78 (BP Location: Left Arm, Patient Position: Sitting, Cuff Size: Normal)   Pulse 73   Temp 98.2 F (36.8 C) (Oral)   Ht 5' (1.524 m)   Wt 170 lb 7 oz (77.3 kg)   SpO2 95%   BMI 33.29 kg/m    CC: cough Subjective:    Patient ID: Lacey Brown, female    DOB: [birth year], 78 y.o.   MRN: [id number]  HPI: Lacey Brown is a 78 y.o. female presenting on 04/03/2018 for Cough (C/o dry cough, wheezing and chest congestion. Started 03/27/18. Tried Mucinex, cough drops and gargling. )   1 wk nonproductive cough, chest congestion, wheezing worse at night time when laying down. HA.   No fevers/chills, ear pain, ST or PNDrainage.  No sick contacts at home No recent smoke exposure. Treating with albuterol 2 puffs BID with some benefit.  She has been taking advair 1 puff daily.   H/o asthma, treats with advair PRN. Has been using more recently with illness. Last asthma flare was 20 yrs ago.  Seen 01/2018 with mild asthma flare treated with zpack and prednisone course.   Upcoming appt 5/5 for cards eval      Relevant past medical, surgical, family and social history reviewed and updated as indicated. Interim medical history since our last visit reviewed. Allergies and medications reviewed and updated. Outpatient Medications Prior to Visit  Medication Sig Dispense Refill  . ADVAIR DISKUS 250-50 MCG/DOSE AEPB TAKE 1 PUFF BY MOUTH TWICE A DAY **RINSE MOUTH AFTER USE** 180 each 2  . Cholecalciferol (VITAMIN D3) 2000 UNITS TABS Take 1 tablet by mouth daily.    Marland Kitchen ibuprofen (ADVIL,MOTRIN) 400 MG tablet Take 400 mg by mouth daily as needed.     Marland Kitchen losartan (COZAAR) 50 MG tablet Take 1 tablet (50 mg total) by mouth daily. 90 tablet 2  . metoprolol succinate (TOPROL-XL) 25 MG 24 hr tablet TAKE 1 TABLET (25 MG TOTAL) BY MOUTH DAILY 90 tablet 2  . Multiple Minerals-Vitamins (CALCIUM CITRATE PLUS PO) Take 1 tablet by mouth daily.    . Omega-3 Fatty Acids (FISH OIL) 1000 MG CAPS Take 1  capsule by mouth daily.    . VENTOLIN HFA 108 (90 Base) MCG/ACT inhaler TAKE 2 PUFFS BY MOUTH EVERY 6 HOURS AS NEEDED FOR WHEEZE OR SHORTNESS OF BREATH 1 Inhaler 2  . vitamin C (ASCORBIC ACID) 500 MG tablet Take 1,000 mg by mouth daily as needed.      No facility-administered medications prior to visit.      Per HPI unless specifically indicated in ROS section below Review of Systems Objective:    BP 134/78 (BP Location: Left Arm, Patient Position: Sitting, Cuff Size: Normal)   Pulse 73   Temp 98.2 F (36.8 C) (Oral)   Ht 5' (1.524 m)   Wt 170 lb 7 oz (77.3 kg)   SpO2 95%   BMI 33.29 kg/m   Wt Readings from Last 3 Encounters:  04/03/18 170 lb 7 oz (77.3 kg)  03/16/18 173 lb (78.5 kg)  01/30/18 171 lb 8 oz (77.8 kg)    Physical Exam Vitals signs and nursing note reviewed.  Constitutional:      General: She is not in acute distress.    Appearance: She is well-developed.  HENT:     Head: Normocephalic and atraumatic.     Right Ear: Hearing, tympanic membrane, ear canal and external ear normal.     Left Ear: Hearing, tympanic membrane, ear canal and external  ear normal.     Nose: No mucosal edema or rhinorrhea.     Right Sinus: No maxillary sinus tenderness or frontal sinus tenderness.     Left Sinus: No maxillary sinus tenderness or frontal sinus tenderness.     Mouth/Throat:     Pharynx: Uvula midline. No oropharyngeal exudate or posterior oropharyngeal erythema.     Tonsils: No tonsillar abscesses.  Eyes:     General: No scleral icterus.    Conjunctiva/sclera: Conjunctivae normal.     Pupils: Pupils are equal, round, and reactive to light.  Neck:     Musculoskeletal: Normal range of motion and neck supple.  Cardiovascular:     Rate and Rhythm: Normal rate and regular rhythm.     Heart sounds: Normal heart sounds. No murmur.  Pulmonary:     Effort: Pulmonary effort is normal. No respiratory distress.     Breath sounds: Wheezing (expiratory wheezing noted) present. No  rhonchi or rales.     Comments: Normal work of breathing Lymphadenopathy:     Cervical: No cervical adenopathy.  Skin:    General: Skin is warm and dry.     Findings: No rash.       Results for orders placed or performed in visit on 01/06/18  Calcium  Result Value Ref Range   Calcium 9.1 8.4 - 10.5 mg/dL   Assessment & Plan:   Problem List Items Addressed This Visit    Asthma exacerbation    Anticipate mild asthma exacerbation. No signs of bacterial infection at this time. Treat with short prednisone course and scheduled albuterol 2 puffs TID x 3 days then PRN. Discussed advair use - rec 1 puff BID daily as controller medication. Update if not improving with treatment.       Relevant Medications   predniSONE (DELTASONE) 20 MG tablet   Asthma - Primary    rec advair 1 puff BID scheduled.       Relevant Medications   predniSONE (DELTASONE) 20 MG tablet       Meds ordered this encounter  Medications  . predniSONE (DELTASONE) 20 MG tablet    Sig: Take 2 tablets (40 mg total) by mouth daily with breakfast.    Dispense:  10 tablet    Refill:  0   No orders of the defined types were placed in this encounter.   Follow up plan: Return if symptoms worsen or fail to improve.  Ria Bush, MD

## 2018-04-03 NOTE — Assessment & Plan Note (Signed)
Anticipate mild asthma exacerbation. No signs of bacterial infection at this time. Treat with short prednisone course and scheduled albuterol 2 puffs TID x 3 days then PRN. Discussed advair use - rec 1 puff BID daily as controller medication. Update if not improving with treatment.

## 2018-04-06 ENCOUNTER — Telehealth: Payer: Self-pay

## 2018-04-06 ENCOUNTER — Ambulatory Visit: Payer: Medicare Other | Admitting: Family Medicine

## 2018-04-06 NOTE — Telephone Encounter (Signed)
Pt called that her ins coverage has changed on Advair to a different tier. Pt would have to pay $ 365.00 for Advair. Pt spoke with someone at [facility] and was advised that fluticasone could be possible substitute for Advair. Pt request Webb Silversmith NP to review and PT SAID NOT TO CHANGE OR DO ANYTHING ABOUT THIS ISSUE UNTIL PT IS CALLED AND DISCUSSED WITH THE PT. [facility].Please advise.

## 2018-04-07 NOTE — Telephone Encounter (Signed)
The problem with that is that Advair is 2 medication in one. And Flovent is a single medication. Does she have any idea if her insurance will cover Breo or Anoro?

## 2018-04-08 NOTE — Telephone Encounter (Signed)
Pt is aware that the only way to find out what is covered and cheaper, is to call her insurance and ask or just send in alternative and have the pharmacy process with insurance to see if there is a difference in price

## 2018-04-08 NOTE — Telephone Encounter (Signed)
Pt left v/m requesting cb about medication.

## 2018-04-22 DIAGNOSIS — H02051 Trichiasis without entropian right upper eyelid: Secondary | ICD-10-CM | POA: Diagnosis not present

## 2018-04-22 DIAGNOSIS — H02054 Trichiasis without entropian left upper eyelid: Secondary | ICD-10-CM | POA: Diagnosis not present

## 2018-05-13 DIAGNOSIS — H16143 Punctate keratitis, bilateral: Secondary | ICD-10-CM | POA: Diagnosis not present

## 2018-05-22 ENCOUNTER — Telehealth: Payer: Self-pay

## 2018-05-22 NOTE — Telephone Encounter (Signed)
As having no sx, Would place on immediate recall list once office is open  If having sx of chest pain would call our office

## 2018-05-22 NOTE — Telephone Encounter (Signed)
Spoke with patient.  Made her aware that Dr. Rockey Situ was limiting the amount of patients coming into the office.  Patient does not have a smart phone.  Patient wanted to cancel appointment and reschedule for a later date.  Patient stated she is not have any symptoms at this time.  Made patient aware that I would let Dr. Rockey Situ review her chart and if he needed her to come in the office I would give her a call.  Otherwise appointment has been cancelled and recall has been placed for August.

## 2018-05-25 NOTE — Telephone Encounter (Signed)
Patient has been called in different encounter Patient is Scheduled 05/28/2018

## 2018-05-30 DIAGNOSIS — H01004 Unspecified blepharitis left upper eyelid: Secondary | ICD-10-CM | POA: Diagnosis not present

## 2018-05-30 DIAGNOSIS — H01001 Unspecified blepharitis right upper eyelid: Secondary | ICD-10-CM | POA: Diagnosis not present

## 2018-06-01 DIAGNOSIS — H01001 Unspecified blepharitis right upper eyelid: Secondary | ICD-10-CM | POA: Diagnosis not present

## 2018-06-01 DIAGNOSIS — H02054 Trichiasis without entropian left upper eyelid: Secondary | ICD-10-CM | POA: Diagnosis not present

## 2018-06-01 DIAGNOSIS — H01004 Unspecified blepharitis left upper eyelid: Secondary | ICD-10-CM | POA: Diagnosis not present

## 2018-06-09 ENCOUNTER — Ambulatory Visit: Payer: Medicare Other | Admitting: Cardiovascular Disease

## 2018-06-18 ENCOUNTER — Telehealth: Payer: Self-pay | Admitting: Internal Medicine

## 2018-06-18 NOTE — Telephone Encounter (Signed)
Prolia benefits submitted. °

## 2018-07-03 ENCOUNTER — Telehealth: Payer: Self-pay | Admitting: Internal Medicine

## 2018-07-03 NOTE — Telephone Encounter (Signed)
Discussed Prolia benefits w/pt.  Pt would owe deductible $198 if not met/$0 if met.

## 2018-07-14 ENCOUNTER — Other Ambulatory Visit: Payer: Self-pay | Admitting: Internal Medicine

## 2018-07-14 ENCOUNTER — Telehealth: Payer: Self-pay | Admitting: Internal Medicine

## 2018-07-14 DIAGNOSIS — Z1231 Encounter for screening mammogram for malignant neoplasm of breast: Secondary | ICD-10-CM

## 2018-07-14 NOTE — Telephone Encounter (Signed)
I need clarification. Has she willingly been out of work because she is immunocompromised (asthma) and concern that she is high risk for COVID 19? Or has she been out of work due to [place] 19, and not wanting to go back because she has asthma?

## 2018-07-14 NOTE — Telephone Encounter (Signed)
Called pt to schedule her DEXA.  Pt wants to do in 10/2018 when she is due for her mammo.  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  She requests a note written for unemployment d/t her asthma.

## 2018-07-20 NOTE — Telephone Encounter (Signed)
Patient said the Governor closed her down. Patient was told it's up to her whether she goes back to work.  She's concerned about being around other people with her asthma and shortness of breath. and having to wear a mask all the time. Patient hasn't been exposed to Covid. She'd like Regina's recommendation about her going back to work. Please call patient back. Patient's phone number is [phone].

## 2018-07-21 ENCOUNTER — Other Ambulatory Visit: Payer: Self-pay

## 2018-07-21 ENCOUNTER — Ambulatory Visit (INDEPENDENT_AMBULATORY_CARE_PROVIDER_SITE_OTHER): Payer: Medicare Other

## 2018-07-21 DIAGNOSIS — M810 Age-related osteoporosis without current pathological fracture: Secondary | ICD-10-CM

## 2018-07-21 MED ORDER — DENOSUMAB 60 MG/ML ~~LOC~~ SOSY
60.0000 mg | PREFILLED_SYRINGE | Freq: Once | SUBCUTANEOUS | Status: AC
  Administered 2018-07-21: 60 mg via SUBCUTANEOUS

## 2018-07-22 NOTE — Telephone Encounter (Signed)
Ok, will give her a note to return on September 1st and we'll see how things are going then.

## 2018-07-26 NOTE — Progress Notes (Signed)
Pt due for Prolia injection. Last Calcium level reviewed. Continue Prolia injections Q6 months Webb Silversmith, NP

## 2018-07-30 NOTE — Telephone Encounter (Signed)
Pt would like the note to state until further notice due to chronic health condition

## 2018-08-03 NOTE — Telephone Encounter (Signed)
Letter completed and pt has picked up letter

## 2018-08-24 ENCOUNTER — Other Ambulatory Visit: Payer: Self-pay | Admitting: Internal Medicine

## 2018-08-26 ENCOUNTER — Ambulatory Visit (INDEPENDENT_AMBULATORY_CARE_PROVIDER_SITE_OTHER): Payer: Medicare Other | Admitting: Internal Medicine

## 2018-08-26 ENCOUNTER — Encounter: Payer: Self-pay | Admitting: Internal Medicine

## 2018-08-26 ENCOUNTER — Other Ambulatory Visit: Payer: Self-pay

## 2018-08-26 VITALS — BP 132/76 | HR 60 | Temp 97.5°F | Wt 175.0 lb

## 2018-08-26 DIAGNOSIS — H6122 Impacted cerumen, left ear: Secondary | ICD-10-CM | POA: Diagnosis not present

## 2018-08-26 DIAGNOSIS — H938X2 Other specified disorders of left ear: Secondary | ICD-10-CM

## 2018-08-26 NOTE — Progress Notes (Signed)
Subjective:    Patient ID: Lacey Brown, female    DOB: April 15, 1940, 78 y.o.   MRN: [id number]  HPI  Patient presents to the clinic today for complaints of ear fullness and decreased hearing in her left ear for "some time now."  She reports she has had to have her ears flushed before due to wax buildup.  She has tried OTC oil drops but cannot recall the name.  She denies any upper respiratory symptoms such as runny nose, nasal congestion, allergies, sore throat.  Review of Systems  Past Medical History:  Diagnosis Date  . Arthritis    back, neck; right shoulder;   . Asthma    using advair prn  . Chicken pox   . Hyperlipidemia   . Hypertension    controlled with medication;     Current Outpatient Medications  Medication Sig Dispense Refill  . ADVAIR DISKUS 250-50 MCG/DOSE AEPB TAKE 1 PUFF BY MOUTH TWICE A DAY **RINSE MOUTH AFTER USE** 180 each 2  . Cholecalciferol (VITAMIN D3) 2000 UNITS TABS Take 1 tablet by mouth daily.    Marland Kitchen ibuprofen (ADVIL,MOTRIN) 400 MG tablet Take 400 mg by mouth daily as needed.     Marland Kitchen losartan (COZAAR) 50 MG tablet Take 1 tablet (50 mg total) by mouth daily. 90 tablet 2  . metoprolol succinate (TOPROL-XL) 25 MG 24 hr tablet TAKE 1 TABLET BY MOUTH EVERY DAY 90 tablet 0  . Multiple Minerals-Vitamins (CALCIUM CITRATE PLUS PO) Take 1 tablet by mouth daily.    . Omega-3 Fatty Acids (FISH OIL) 1000 MG CAPS Take 1 capsule by mouth daily.    . predniSONE (DELTASONE) 20 MG tablet Take 2 tablets (40 mg total) by mouth daily with breakfast. 10 tablet 0  . VENTOLIN HFA 108 (90 Base) MCG/ACT inhaler TAKE 2 PUFFS BY MOUTH EVERY 6 HOURS AS NEEDED FOR WHEEZE OR SHORTNESS OF BREATH 1 Inhaler 2  . vitamin C (ASCORBIC ACID) 500 MG tablet Take 1,000 mg by mouth daily as needed.      No current facility-administered medications for this visit.     Allergies  Allergen Reactions  . Ace Inhibitors Cough    Other reaction(s): Cough  . Codeine Nausea Only    Family  History  Problem Relation Age of Onset  . Heart disease Mother   . Heart disease Father   . Arthritis Maternal Grandmother   . Diabetes Neg Hx   . Cancer Neg Hx   . Stroke Neg Hx     Social History   Socioeconomic History  . Marital status: Widowed    Spouse name: Not on file  . Number of children: Not on file  . Years of education: Not on file  . Highest education level: Not on file  Occupational History  . Not on file  Social Needs  . Financial resource strain: Not on file  . Food insecurity    Worry: Not on file    Inability: Not on file  . Transportation needs    Medical: Not on file    Non-medical: Not on file  Tobacco Use  . Smoking status: Former Smoker    Quit date: 04/17/2009    Years since quitting: 9.3  . Smokeless tobacco: Never Used  Substance and Sexual Activity  . Alcohol use: Yes    Alcohol/week: 0.0 standard drinks    Comment: occasional  . Drug use: No  . Sexual activity: Never  Lifestyle  . Physical activity  Days per week: Not on file    Minutes per session: Not on file  . Stress: Not on file  Relationships  . Social Herbalist on phone: Not on file    Gets together: Not on file    Attends religious service: Not on file    Active member of club or organization: Not on file    Attends meetings of clubs or organizations: Not on file    Relationship status: Not on file  . Intimate partner violence    Fear of current or ex partner: Not on file    Emotionally abused: Not on file    Physically abused: Not on file    Forced sexual activity: Not on file  Other Topics Concern  . Not on file  Social History Narrative  . Not on file     Constitutional: Denies fever, malaise, fatigue, headache or abrupt weight changes.  HEENT: Complains of wax buildup in left ear with associated fullness.  Denies eye pain, eye redness, runny nose, nasal congestion, bloody nose, or sore throat. Respiratory: Denies difficulty breathing, shortness of  breath, cough or sputum production.   Skin: Denies redness, rashes, lesions or ulcercations.  Neurological: Denies dizziness or problems with balance and coordination.    No other specific complaints in a complete review of systems (except as listed in HPI above).     Objective:   Physical Exam  BP 132/76   Pulse 60   Temp (!) 97.5 F (36.4 C) (Temporal)   Wt 175 lb (79.4 kg)   SpO2 97%   BMI 34.18 kg/m    Wt Readings from Last 3 Encounters:  04/03/18 170 lb 7 oz (77.3 kg)  03/16/18 173 lb (78.5 kg)  01/30/18 171 lb 8 oz (77.8 kg)    General: Appears her stated age, well developed, well nourished in NAD. HEENT: Head: normal shape and size; Ears: Right Tm's gray and intact with small blood filled blister-like area noted in the external canal; Right TM obscured by wax.  BMET    Component Value Date/Time   NA 133 (L) 11/25/2017 1205   K 4.5 11/25/2017 1205   CL 99 11/25/2017 1205   CO2 30 11/25/2017 1205   GLUCOSE 93 11/25/2017 1205   BUN 18 11/25/2017 1205   CREATININE 0.79 11/25/2017 1205   CALCIUM 9.1 01/06/2018 1423    Lipid Panel     Component Value Date/Time   CHOL 191 11/25/2017 1205   TRIG 69.0 11/25/2017 1205   HDL 70.80 11/25/2017 1205   CHOLHDL 3 11/25/2017 1205   VLDL 13.8 11/25/2017 1205   LDLCALC 107 (H) 11/25/2017 1205    CBC    Component Value Date/Time   WBC 7.4 11/25/2017 1205   RBC 4.15 11/25/2017 1205   HGB 12.8 11/25/2017 1205   HCT 37.8 11/25/2017 1205   PLT 224.0 11/25/2017 1205   MCV 91.1 11/25/2017 1205   MCHC 33.8 11/25/2017 1205   RDW 13.2 11/25/2017 1205    Hgb A1C No results found for: HGBA1C          Assessment & Plan:    Ear Fullness r/t Cerumen Impaction:  Will flush ear today. Encouraged patient to use OTC ear drops to soften wax at home and avoid using Q-tips. Instructed patient to use OTC Flonase if ear fullness doesn't subside about 1 week after wax removed.  Return precautions discussed.  Webb Silversmith, NP

## 2018-08-26 NOTE — Patient Instructions (Signed)
Earwax Buildup, Adult The ears produce a substance called earwax that helps keep bacteria out of the ear and protects the skin in the ear canal. Occasionally, earwax can build up in the ear and cause discomfort or hearing loss. What increases the risk? This condition is more likely to develop in people who:  Are female.  Are elderly.  Naturally produce more earwax.  Clean their ears often with cotton swabs.  Use earplugs often.  Use in-ear headphones often.  Wear hearing aids.  Have narrow ear canals.  Have earwax that is overly thick or sticky.  Have eczema.  Are dehydrated.  Have excess hair in the ear canal. What are the signs or symptoms? Symptoms of this condition include:  Reduced or muffled hearing.  A feeling of fullness in the ear or feeling that the ear is plugged.  Fluid coming from the ear.  Ear pain.  Ear itch.  Ringing in the ear.  Coughing.  An obvious piece of earwax that can be seen inside the ear canal. How is this diagnosed? This condition may be diagnosed based on:  Your symptoms.  Your medical history.  An ear exam. During the exam, your health care provider will look into your ear with an instrument called an otoscope. You may have tests, including a hearing test. How is this treated? This condition may be treated by:  Using ear drops to soften the earwax.  Having the earwax removed by a health care provider. The health care provider may: ? Flush the ear with water. ? Use an instrument that has a loop on the end (curette). ? Use a suction device.  Surgery to remove the wax buildup. This may be done in severe cases. Follow these instructions at home:   Take over-the-counter and prescription medicines only as told by your health care provider.  Do not put any objects, including cotton swabs, into your ear. You can clean the opening of your ear canal with a washcloth or facial tissue.  Follow instructions from your health care  provider about cleaning your ears. Do not over-clean your ears.  Drink enough fluid to keep your urine clear or pale yellow. This will help to thin the earwax.  Keep all follow-up visits as told by your health care provider. If earwax builds up in your ears often or if you use hearing aids, consider seeing your health care provider for routine, preventive ear cleanings. Ask your health care provider how often you should schedule your cleanings.  If you have hearing aids, clean them according to instructions from the manufacturer and your health care provider. Contact a health care provider if:  You have ear pain.  You develop a fever.  You have blood, pus, or other fluid coming from your ear.  You have hearing loss.  You have ringing in your ears that does not go away.  Your symptoms do not improve with treatment.  You feel like the room is spinning (vertigo). Summary  Earwax can build up in the ear and cause discomfort or hearing loss.  The most common symptoms of this condition include reduced or muffled hearing and a feeling of fullness in the ear or feeling that the ear is plugged.  This condition may be diagnosed based on your symptoms, your medical history, and an ear exam.  This condition may be treated by using ear drops to soften the earwax or by having the earwax removed by a health care provider.  Do not put any   objects, including cotton swabs, into your ear. You can clean the opening of your ear canal with a washcloth or facial tissue. This information is not intended to replace advice given to you by your health care provider. Make sure you discuss any questions you have with your health care provider. Document Released: 02/29/2004 Document Revised: 01/03/2017 Document Reviewed: 04/03/2016 Elsevier Patient Education  2020 Elsevier Inc.  

## 2018-09-09 ENCOUNTER — Telehealth: Payer: Self-pay | Admitting: Internal Medicine

## 2018-09-09 NOTE — Telephone Encounter (Signed)
Pt called to request a callback concerning cardiology referral. She wants to know why this was put in and what the concerns are. I explained this was from 03/16/18 appt where SOB and chest tightness was discussed and the cardiology office was delayed in scheduling due to covid. Pt is still requesting a call back to discuss.

## 2018-09-10 NOTE — Telephone Encounter (Signed)
Pt is aware that the referral is from her OV in Feb... pt states she has not had any Sx and would like to hold off on referral

## 2018-09-30 ENCOUNTER — Telehealth: Payer: Self-pay | Admitting: Internal Medicine

## 2018-09-30 NOTE — Telephone Encounter (Signed)
Cardiology referral Denied/Closed. Rejection Reason - Patient did not respond" [place].

## 2018-09-30 NOTE — Telephone Encounter (Signed)
noted 

## 2018-10-13 DIAGNOSIS — Z23 Encounter for immunization: Secondary | ICD-10-CM | POA: Diagnosis not present

## 2018-10-20 ENCOUNTER — Ambulatory Visit
Admission: RE | Admit: 2018-10-20 | Discharge: 2018-10-20 | Disposition: A | Discharge status: Home / Self Care | Payer: Medicare Other | Source: Ambulatory Visit | Attending: Internal Medicine | Admitting: Internal Medicine

## 2018-10-20 ENCOUNTER — Other Ambulatory Visit: Payer: Self-pay

## 2018-10-20 DIAGNOSIS — Z780 Asymptomatic menopausal state: Secondary | ICD-10-CM

## 2018-10-20 DIAGNOSIS — Z1231 Encounter for screening mammogram for malignant neoplasm of breast: Secondary | ICD-10-CM | POA: Diagnosis not present

## 2018-10-20 DIAGNOSIS — S32000D Wedge compression fracture of unspecified lumbar vertebra, subsequent encounter for fracture with routine healing: Secondary | ICD-10-CM

## 2018-10-20 DIAGNOSIS — G8929 Other chronic pain: Secondary | ICD-10-CM

## 2018-10-20 DIAGNOSIS — M545 Low back pain, unspecified: Secondary | ICD-10-CM

## 2018-10-20 DIAGNOSIS — M810 Age-related osteoporosis without current pathological fracture: Secondary | ICD-10-CM | POA: Diagnosis not present

## 2018-10-20 DIAGNOSIS — M85852 Other specified disorders of bone density and structure, left thigh: Secondary | ICD-10-CM | POA: Diagnosis not present

## 2018-10-20 IMAGING — MG MM DIGITAL SCREENING BILAT W/ TOMO W/ CAD
8 series · 8 of 24 positions shown · non-contrast
Comparison: Previous exam(s).

CLINICAL DATA: Screening.

EXAM:
DIGITAL SCREENING BILATERAL MAMMOGRAM WITH TOMO AND CAD

[R MLO synth-2D]
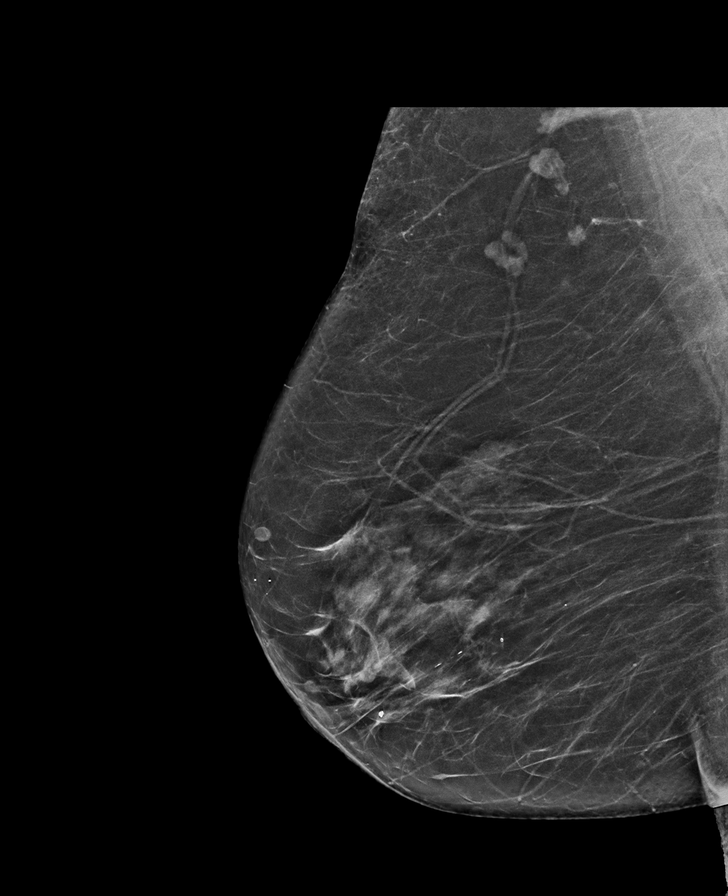

[R CC synth-2D]
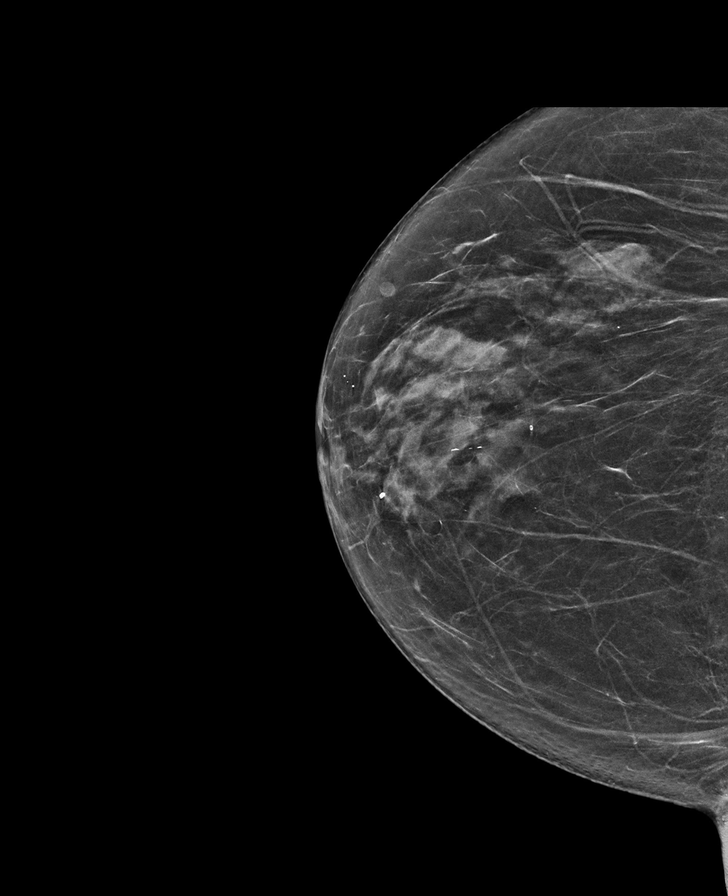

[L CC synth-2D]
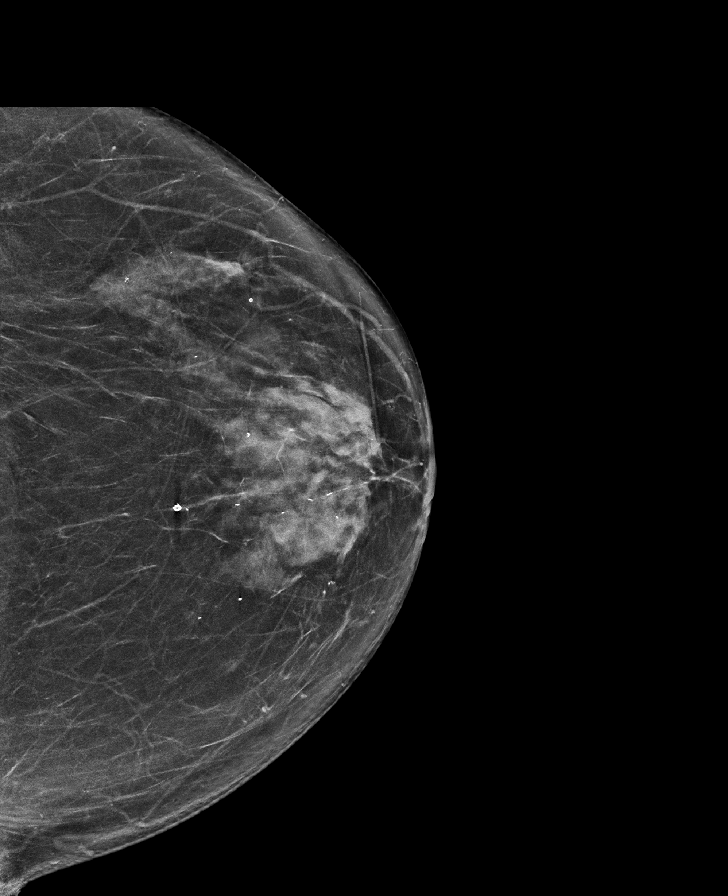

[L MLO synth-2D]
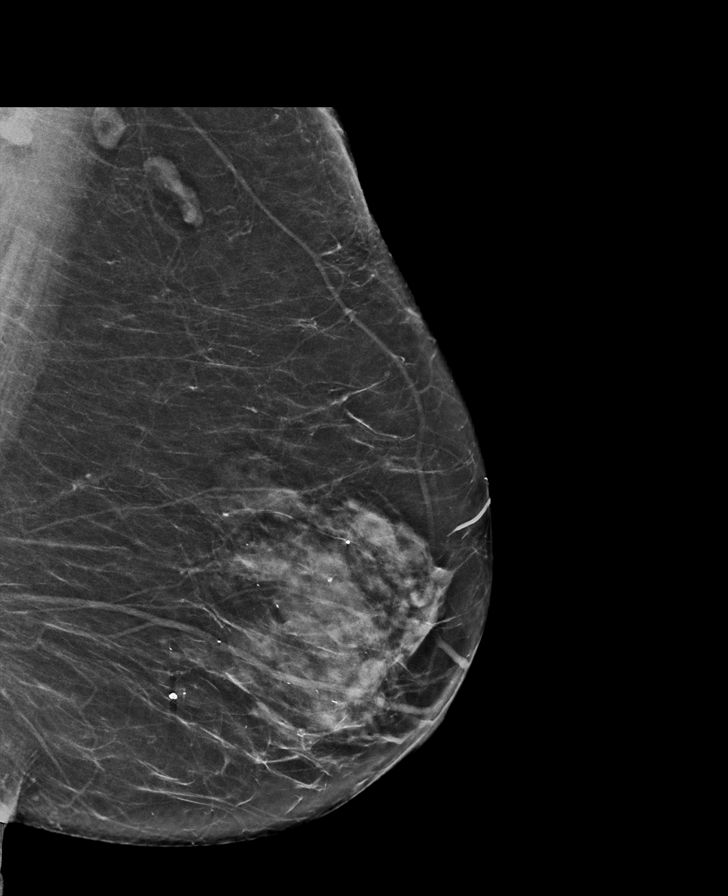

[R MLO tomo · tomo slice 36/71.0]
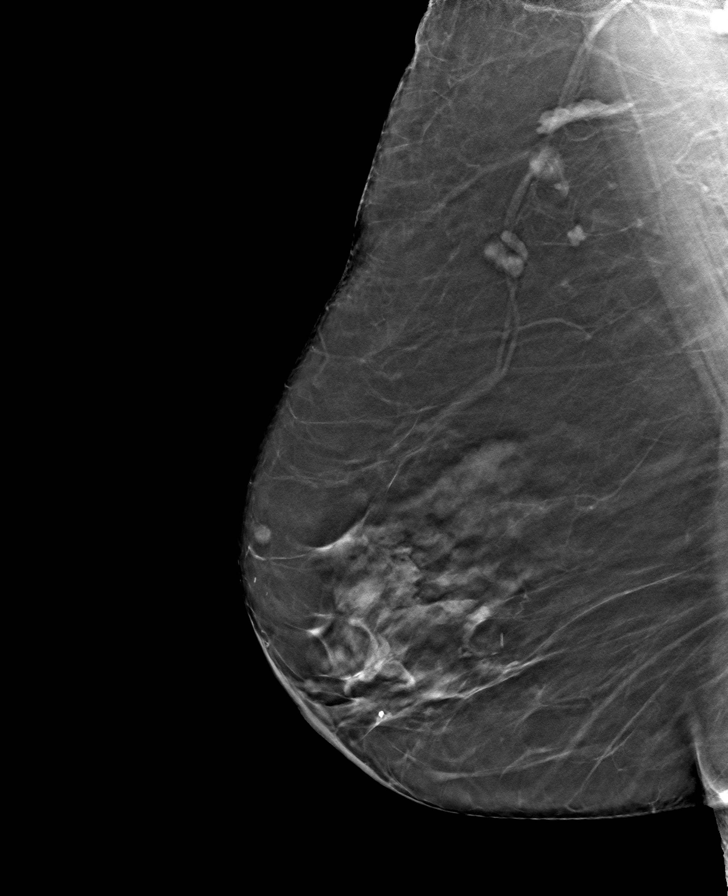

[L CC tomo · tomo slice 36/71.0]
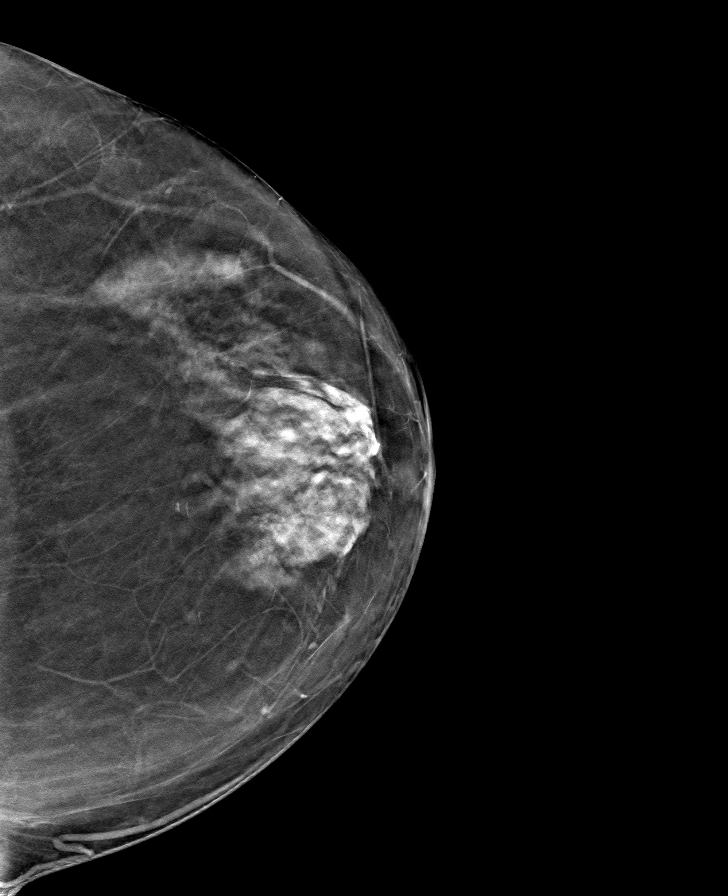

[R CC tomo · tomo slice 33/65.0]
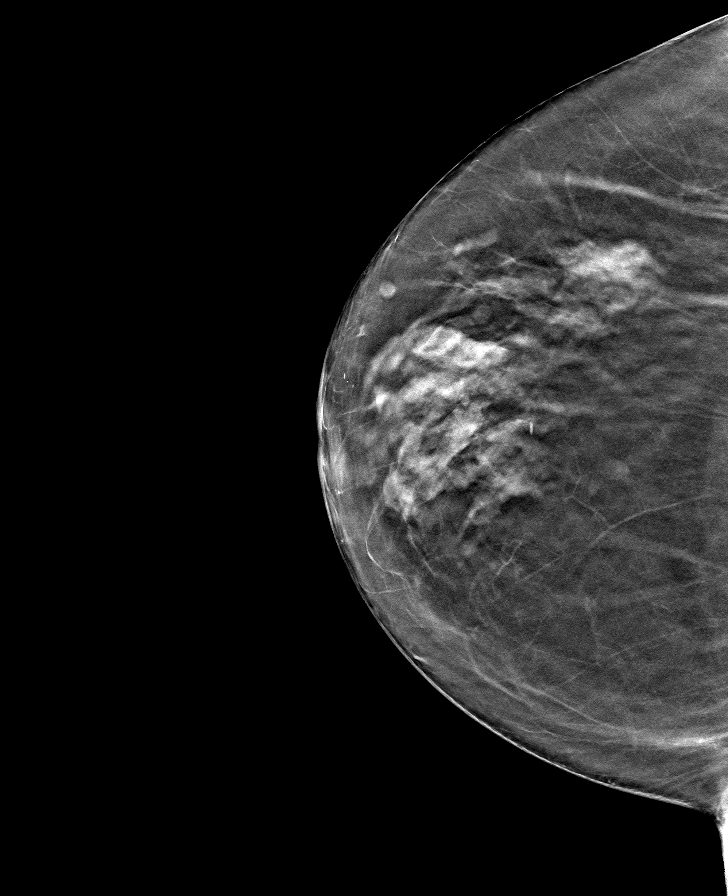

[L MLO tomo · tomo slice 35/70.0]
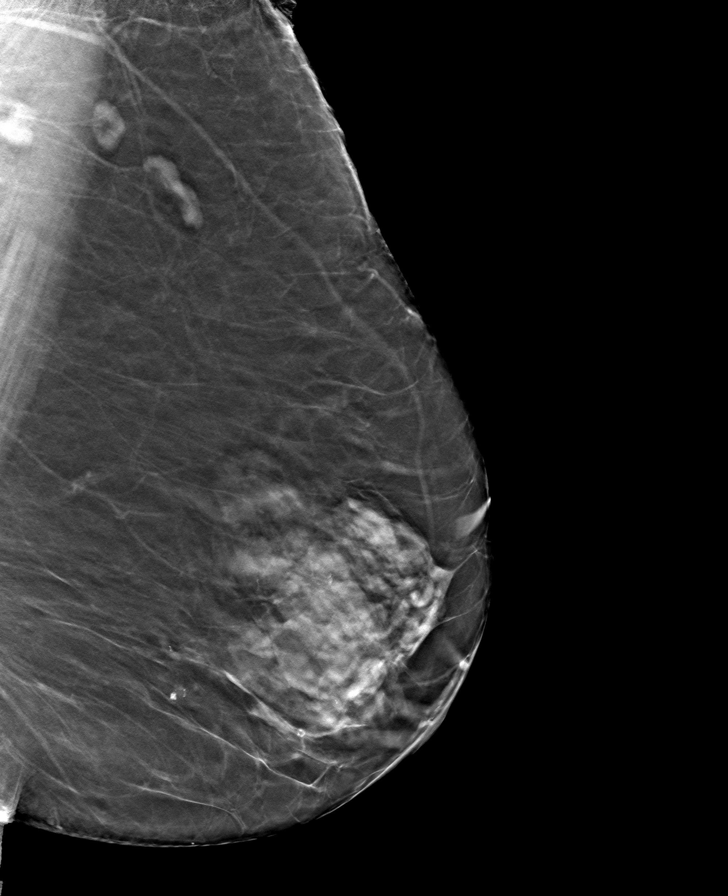

[8 of 24 positions shown; findings below may reference images not displayed]

ACR Breast Density Category c: The breast tissue is heterogeneously
dense, which may obscure small masses.
FINDINGS: There are no findings suspicious for malignancy. Images were
processed with CAD.
IMPRESSION: No mammographic evidence of malignancy. A result letter of this
screening mammogram will be mailed directly to the patient.

RECOMMENDATION:
Screening mammogram in one year. (Code:[5V])

BI-RADS CATEGORY  1: Negative.

## 2018-11-03 ENCOUNTER — Telehealth: Payer: Self-pay | Admitting: Internal Medicine

## 2018-11-03 NOTE — Telephone Encounter (Signed)
She needs to make an appt to discuss. Virtual is fine if she is having allergy symptoms. She will need to come here to get her blood pressure checked before visit.

## 2018-11-03 NOTE — Telephone Encounter (Signed)
Patient has some questions about her Losartan.  Patient's asking for Threasa Beards to call her back.

## 2018-11-03 NOTE — Telephone Encounter (Signed)
Patient called back and stated that she has been having headaches almost every day. Patient stated that she has been having to blow her nose at times and the congestion most of the time is clear. Patient stated that she is wondering if the Losartan is causing her to have the headaches? [facility], so she is taking 25 mg two a day. Patient stated that she does not have any way of checking her blood pressure.  Patient stated that Webb Silversmith NP gave her a note to be out of work and she wants to know if Rollene Fare will extend that until the first of the year?

## 2018-11-05 NOTE — Telephone Encounter (Signed)
Left message on voicemail.

## 2018-11-06 NOTE — Telephone Encounter (Signed)
2pm slot is on hold just in case pt can do virtual visit, will try to call pt again

## 2018-11-06 NOTE — Telephone Encounter (Signed)
Agree with advice given, ok to extend work note to January 2021.

## 2018-11-06 NOTE — Telephone Encounter (Signed)
[place] Night - Client Nonclinical Telephone Record [facility] Night - Client Client Site [place] - Night Physician [facility] - PHYSICIAN, NOT LISTED- MD Contact Type Call Who Is Calling Patient / Member / Family / Caregiver Caller Name [place] Phone Number [phone] Call Type Message Only Information Provided Reason for Call Returning a Call from the Office Initial [place] states she was returning a call from the office. Caller states she was contacted by [employer]. Caller sees Dr. Thersa Salt. Additional Comment Caller declined triage. Call Closed By: Britt Boozer Transaction Date/Time: 11/05/2018 5:02:39 PM (ET)

## 2018-11-06 NOTE — Telephone Encounter (Signed)
Pt has been having intermittent headaches--denies cardiac Sx, not concerned for BP issues per pt report, has Hx of Asthma and allergies... suggested to try Claritn to see if it will help with Sx and give an update in a week, also suggested if ear fullness is continued concern to add Flonase in the AM and Claritin at bedtime...    Please confirm if it is okay to extend note to work to be out through July 2021

## 2018-11-09 ENCOUNTER — Telehealth: Payer: Self-pay | Admitting: Internal Medicine

## 2018-11-09 NOTE — Telephone Encounter (Signed)
error 

## 2018-11-09 NOTE — Telephone Encounter (Signed)
Patient stated that she was suppose to receive a phone call today in regards to medications? She stated that we would be getting in touch with [facility] for her and would call her but she has not heard from anyone.

## 2018-11-10 MED ORDER — LOSARTAN POTASSIUM 50 MG PO TABS: 50.0000 mg | ORAL_TABLET | Freq: Every day | ORAL | 0 refills | Status: DC

## 2018-11-10 NOTE — Telephone Encounter (Signed)
Letter completed and placed in the front office for pick up, pt is aware

## 2018-11-10 NOTE — Telephone Encounter (Signed)
Letter has been done and placed in the front office for pt to pick up and she is aware

## 2018-11-17 ENCOUNTER — Telehealth: Payer: Self-pay | Admitting: Internal Medicine

## 2018-11-17 NOTE — Telephone Encounter (Signed)
Patient is requesting a call back She stated that she is using the ear drops for the wax build up. Patient used it this morning and stated that it had some foamed. and her ear feels stopped up now  She is not sure if this is normal.  C/B [phone]

## 2018-11-18 ENCOUNTER — Other Ambulatory Visit: Payer: Self-pay | Admitting: Internal Medicine

## 2018-11-19 NOTE — Telephone Encounter (Signed)
It has peroxide in it. It can be normal for it to foam. Once it stops foaming it can take up to 24 hours to return to normal. She can make an appt here for [place] to remove it if she would like.

## 2018-11-20 NOTE — Telephone Encounter (Signed)
Left message on voicemail We have openings this afternoon if pt would like Regina to look in ear

## 2018-11-24 DIAGNOSIS — H25813 Combined forms of age-related cataract, bilateral: Secondary | ICD-10-CM | POA: Diagnosis not present

## 2018-12-01 ENCOUNTER — Other Ambulatory Visit: Payer: Self-pay

## 2018-12-01 ENCOUNTER — Ambulatory Visit (INDEPENDENT_AMBULATORY_CARE_PROVIDER_SITE_OTHER): Payer: Medicare Other | Admitting: Internal Medicine

## 2018-12-01 ENCOUNTER — Encounter: Payer: Self-pay | Admitting: Internal Medicine

## 2018-12-01 VITALS — BP 130/84 | HR 63 | Temp 97.5°F | Ht 59.0 in | Wt 177.0 lb

## 2018-12-01 DIAGNOSIS — R0600 Dyspnea, unspecified: Secondary | ICD-10-CM | POA: Diagnosis not present

## 2018-12-01 DIAGNOSIS — I10 Essential (primary) hypertension: Secondary | ICD-10-CM

## 2018-12-01 DIAGNOSIS — R0789 Other chest pain: Secondary | ICD-10-CM

## 2018-12-01 DIAGNOSIS — Z0000 Encounter for general adult medical examination without abnormal findings: Secondary | ICD-10-CM | POA: Diagnosis not present

## 2018-12-01 DIAGNOSIS — R0609 Other forms of dyspnea: Secondary | ICD-10-CM

## 2018-12-01 DIAGNOSIS — J4531 Mild persistent asthma with (acute) exacerbation: Secondary | ICD-10-CM | POA: Diagnosis not present

## 2018-12-01 DIAGNOSIS — E559 Vitamin D deficiency, unspecified: Secondary | ICD-10-CM | POA: Diagnosis not present

## 2018-12-01 DIAGNOSIS — E7800 Pure hypercholesterolemia, unspecified: Secondary | ICD-10-CM

## 2018-12-01 DIAGNOSIS — M1990 Unspecified osteoarthritis, unspecified site: Secondary | ICD-10-CM

## 2018-12-01 LAB — COMPREHENSIVE METABOLIC PANEL
ALT: 15 U/L (ref 0–35)
AST: 16 U/L (ref 0–37)
Albumin: 4.1 g/dL (ref 3.5–5.2)
Alkaline Phosphatase: 43 U/L (ref 39–117)
BUN: 16 mg/dL (ref 6–23)
CO2: 31 mEq/L (ref 19–32)
Calcium: 9 mg/dL (ref 8.4–10.5)
Chloride: 98 mEq/L (ref 96–112)
Creatinine, Ser: 0.61 mg/dL (ref 0.40–1.20)
GFR: 94.45 mL/min (ref >60.00)
Glucose, Bld: 99 mg/dL (ref 70–99)
Potassium: 5 mEq/L (ref 3.5–5.1)
Sodium: 132 mEq/L — ABNORMAL LOW (ref 135–145)
Total Bilirubin: 0.3 mg/dL (ref 0.2–1.2)
Total Protein: 6.2 g/dL (ref 6.0–8.3)

## 2018-12-01 LAB — CBC
HCT: 38.6 % (ref 36.0–46.0)
Hemoglobin: 12.7 g/dL (ref 12.0–15.0)
MCHC: 32.9 g/dL (ref 30.0–36.0)
MCV: 93.2 fl (ref 78.0–100.0)
Platelets: 220 10*3/uL (ref 150.0–400.0)
RBC: 4.13 Mil/uL (ref 3.87–5.11)
RDW: 13.4 % (ref 11.5–15.5)
WBC: 7.3 10*3/uL (ref 4.0–10.5)

## 2018-12-01 LAB — LIPID PANEL
Cholesterol: 196 mg/dL (ref 0–200)
HDL: 72.2 mg/dL (ref >39.00)
LDL Cholesterol: 112 mg/dL — ABNORMAL HIGH (ref 0–99)
NonHDL: 123.63
Total CHOL/HDL Ratio: 3
Triglycerides: 56 mg/dL (ref 0.0–149.0)
VLDL: 11.2 mg/dL (ref 0.0–40.0)

## 2018-12-01 NOTE — Progress Notes (Signed)
HPI:  Pt presents to the clinic today for her Medicare Wellness Exam. She is also due to follow up chronic conditions.  HTN: Her BP today is 130/84. She is taking Losartan and Metoprolol as prescribed. ECG from 03/2018 reviewed.  Ashtma: She denies chronic cough or SOB. She is using her Advair as prescribed. She rarely uses Albuterol. There are no PFT's on file.   Arthritis: Generalized. She takes Tylenol as needed with good relief of symptoms.   Past Medical History:  Diagnosis Date  . Arthritis    back, neck; right shoulder;   . Asthma    using advair prn  . Chicken pox   . Hyperlipidemia   . Hypertension    controlled with medication;     Current Outpatient Medications  Medication Sig Dispense Refill  . ADVAIR DISKUS 250-50 MCG/DOSE AEPB TAKE 1 PUFF BY MOUTH TWICE A DAY **RINSE MOUTH AFTER USE** 180 each 2  . Cholecalciferol (VITAMIN D3) 2000 UNITS TABS Take 1 tablet by mouth daily.    Marland Kitchen ibuprofen (ADVIL,MOTRIN) 400 MG tablet Take 400 mg by mouth daily as needed.     Marland Kitchen losartan (COZAAR) 50 MG tablet Take 1 tablet (50 mg total) by mouth daily. 90 tablet 0  . metoprolol succinate (TOPROL-XL) 25 MG 24 hr tablet TAKE 1 TABLET BY MOUTH EVERY DAY 90 tablet 0  . Multiple Minerals-Vitamins (CALCIUM CITRATE PLUS PO) Take 1 tablet by mouth daily.    . Omega-3 Fatty Acids (FISH OIL) 1000 MG CAPS Take 1 capsule by mouth daily.    . predniSONE (DELTASONE) 20 MG tablet Take 2 tablets (40 mg total) by mouth daily with breakfast. 10 tablet 0  . VENTOLIN HFA 108 (90 Base) MCG/ACT inhaler TAKE 2 PUFFS BY MOUTH EVERY 6 HOURS AS NEEDED FOR WHEEZE OR SHORTNESS OF BREATH 1 Inhaler 2  . vitamin C (ASCORBIC ACID) 500 MG tablet Take 1,000 mg by mouth daily as needed.      No current facility-administered medications for this visit.     Allergies  Allergen Reactions  . Ace Inhibitors Cough    Other reaction(s): Cough  . Codeine Nausea Only    Family History  Problem Relation Age of Onset  .  Heart disease Mother   . Heart disease Father   . Arthritis Maternal Grandmother   . Diabetes Neg Hx   . Cancer Neg Hx   . Stroke Neg Hx     Social History   Socioeconomic History  . Marital status: Widowed    Spouse name: Not on file  . Number of children: Not on file  . Years of education: Not on file  . Highest education level: Not on file  Occupational History  . Not on file  Social Needs  . Financial resource strain: Not on file  . Food insecurity    Worry: Not on file    Inability: Not on file  . Transportation needs    Medical: Not on file    Non-medical: Not on file  Tobacco Use  . Smoking status: Former Smoker    Quit date: 04/17/2009    Years since quitting: 9.6  . Smokeless tobacco: Never Used  Substance and Sexual Activity  . Alcohol use: Yes    Alcohol/week: 0.0 standard drinks    Comment: occasional  . Drug use: No  . Sexual activity: Never  Lifestyle  . Physical activity    Days per week: Not on file    Minutes per session: Not  on file  . Stress: Not on file  Relationships  . Social Herbalist on phone: Not on file    Gets together: Not on file    Attends religious service: Not on file    Active member of club or organization: Not on file    Attends meetings of clubs or organizations: Not on file    Relationship status: Not on file  . Intimate partner violence    Fear of current or ex partner: Not on file    Emotionally abused: Not on file    Physically abused: Not on file    Forced sexual activity: Not on file  Other Topics Concern  . Not on file  Social History Narrative  . Not on file    Hospitiliaztions: None  Health Maintenance:    Flu: 10/2018  Tetanus: > 10 years  Pneumovax: 10/2009  Prevnar: 06/2014  Zostavax: 11/2009  Shingrix: 10/2018  Mammogram: 10/2018  Pap Smear: no longer screening  Bone Density: 10/2018  Colon Screening: 09/2005  Eye Doctor: annually  Dental Exam: as needed   Providers:   PCP: Webb Silversmith, NP    I have personally reviewed and have noted:  1. [name] medical and social history 2. Their use of alcohol, tobacco or illicit drugs 3. Their current medications and supplements 4. [name] functional ability including ADL's, fall risks, home safety risks and hearing or visual impairment. 5. Diet and physical activities 6. Evidence for depression or mood disorder  Subjective:   Review of Systems:   Constitutional: Denies fever, malaise, fatigue, headache or abrupt weight changes.  HEENT: Denies eye pain, eye redness, ear pain, ringing in the ears, wax buildup, runny nose, nasal congestion, bloody nose, or sore throat. Respiratory: Pt reports intermittent dyspnea on exertion. Denies difficulty breathing, cough or sputum production.   Cardiovascular: Pt reports intermittent chest tightness with exertion Denies chest pain, palpitations or swelling in the hands or feet.  Gastrointestinal: Denies abdominal pain, bloating, constipation, diarrhea or blood in the stool.  GU: Denies urgency, frequency, pain with urination, burning sensation, blood in urine, odor or discharge. Musculoskeletal: Pt reports intermittent joint pain. Denies decrease in range of motion, difficulty with gait, muscle pain or joint swelling.  Skin: Denies redness, rashes, lesions or ulcercations.  Neurological: Denies dizziness, difficulty with memory, difficulty with speech or problems with balance and coordination.  Psych: Denies anxiety, depression, SI/HI.  No other specific complaints in a complete review of systems (except as listed in HPI above).  Objective:  PE:   BP 130/84   Pulse 63   Temp (!) 97.5 F (36.4 C) (Temporal)   Ht 4\' 11"  (1.499 m)   Wt 80.3 kg   SpO2 98%   BMI 35.75 kg/m   Wt Readings from Last 3 Encounters:  12/01/18 177 lb (80.3 kg)  08/26/18 175 lb (79.4 kg)  04/03/18 170 lb 7 oz (77.3 kg)    General: Appears her stated age, obese, in NAD. Skin: Warm, dry  and intact. No rashes, lesions or ulcerations noted. HEENT: Head: normal shape and size; Eyes: sclera white, no icterus, conjunctiva pink, PERRLA and EOMs intact; Ears: Tm's gray and intact, normal light reflex;  Neck: Neck supple, trachea midline. No masses, lumps or thyromegaly present.  Cardiovascular: Normal rate and rhythm. S1,S2 noted.  No murmur, rubs or gallops noted. No JVD or BLE edema. No carotid bruits noted. Pulmonary/Chest: Normal effort and positive vesicular breath sounds. No respiratory distress. No  wheezes, rales or ronchi noted.  Abdomen: Soft and nontender. Normal bowel sounds. No distention or masses noted. Liver, spleen and kidneys non palpable. Musculoskeletal: Strength 5/5 BUE/BLE. No difficulty with gait. Neurological: Alert and oriented. Cranial nerves II-XII grossly intact. Coordination normal.  Psychiatric: Mood and affect normal. Behavior is normal. Judgment and thought content normal.     BMET    Component Value Date/Time   NA 133 (L) 11/25/2017 1205   K 4.5 11/25/2017 1205   CL 99 11/25/2017 1205   CO2 30 11/25/2017 1205   GLUCOSE 93 11/25/2017 1205   BUN 18 11/25/2017 1205   CREATININE 0.79 11/25/2017 1205   CALCIUM 9.1 01/06/2018 1423    Lipid Panel     Component Value Date/Time   CHOL 191 11/25/2017 1205   TRIG 69.0 11/25/2017 1205   HDL 70.80 11/25/2017 1205   CHOLHDL 3 11/25/2017 1205   VLDL 13.8 11/25/2017 1205   LDLCALC 107 (H) 11/25/2017 1205    CBC    Component Value Date/Time   WBC 7.4 11/25/2017 1205   RBC 4.15 11/25/2017 1205   HGB 12.8 11/25/2017 1205   HCT 37.8 11/25/2017 1205   PLT 224.0 11/25/2017 1205   MCV 91.1 11/25/2017 1205   MCHC 33.8 11/25/2017 1205   RDW 13.2 11/25/2017 1205    Hgb A1C No results found for: HGBA1C    Assessment and Plan:   Medicare Annual Wellness Visit:  Diet: She does eat meat. She consumes fruits and veggies daily.  Physical activity: Sedentary Depression/mood screen: Negative, PHQ 9  score of 0 Hearing: Intact to whispered voice Visual acuity: Grossly normal, performs annual eye exam  ADLs: Capable Fall risk: None Home safety: Good Cognitive evaluation: Intact to orientation, naming, recall and repetition EOL planning: No adv directives, full code/ I agree  Preventative Medicine: Flu, pneumovax, prevnar and zostovax UTD. She declines tetanus for financial reasons. She needs her second dose of shingrix. She no longer wants to screen for cervical cancer or colon cancer. Mammogram UTD. Bone density UTD. Encouraged her to consume a balanced diet and exercise regimen. Advised her to see an eye doctor and dentist annually. Will check CBC, CMET, Lipid and Vit D today. Due dates for screening exams given to patient as part of her AVS.  Persistent DOE, Chest Tightness:  ECG done 03/2018 Was referred back in February but she reports they cancelled her appt and never rescheduled Referral to cardiology placed  Next appointment:  1 year, Medicare Wellness Exam   Webb Silversmith, NP

## 2018-12-03 LAB — VITAMIN D 25 HYDROXY (VIT D DEFICIENCY, FRACTURES): VITD: 34.48 ng/mL (ref 30.00–100.00)

## 2018-12-06 NOTE — Patient Instructions (Signed)

## 2018-12-06 NOTE — Assessment & Plan Note (Signed)
Encourage regular physical activity Continue Tylenol as needed

## 2018-12-06 NOTE — Assessment & Plan Note (Signed)
Controlled on Losartan and Metoprolol Reinforced DASH diet C met today

## 2018-12-06 NOTE — Assessment & Plan Note (Signed)
C met and lipid profile today Encouraged her to consume a low fat diet We will monitor

## 2018-12-06 NOTE — Assessment & Plan Note (Signed)
Continue Advair and Albuterol We will continue to monitor

## 2018-12-07 ENCOUNTER — Telehealth: Payer: Self-pay

## 2018-12-07 ENCOUNTER — Encounter: Payer: Self-pay | Admitting: Internal Medicine

## 2018-12-07 MED ORDER — FLUTICASONE-SALMETEROL 250-50 MCG/DOSE IN AEPB: INHALATION_SPRAY | RESPIRATORY_TRACT | 2 refills | Status: DC

## 2018-12-07 NOTE — Telephone Encounter (Signed)
Patient called requesting a refill on her advair inhaler to be sent to [facility] on [address] - and she wanted to be sure that Rollene Fare wanted her to still take the 1 puff twice daily?  Thanks!

## 2018-12-07 NOTE — Telephone Encounter (Signed)
Rx sent through e-scribe  

## 2018-12-10 ENCOUNTER — Other Ambulatory Visit: Payer: Self-pay | Admitting: Internal Medicine

## 2018-12-14 ENCOUNTER — Telehealth: Payer: Self-pay

## 2018-12-14 NOTE — Telephone Encounter (Signed)
Discussed Prolia benefits w/pt.  Pt would owe$0.  Pt scheduled for next Prolia 01-21-2019.  Pt wants to also get her 2d Shingles vaccination in Dec and would like to know if it would interfere with her Prolia injection.

## 2018-12-15 NOTE — Telephone Encounter (Signed)
Best not to get the 2 together, would wait at least 4-6 weeks

## 2018-12-16 ENCOUNTER — Telehealth: Payer: Self-pay | Admitting: Internal Medicine

## 2018-12-16 NOTE — Telephone Encounter (Signed)
Patient called to get the results of her lab work.

## 2019-01-13 ENCOUNTER — Ambulatory Visit (INDEPENDENT_AMBULATORY_CARE_PROVIDER_SITE_OTHER): Payer: Medicare Other | Admitting: Cardiology

## 2019-01-13 ENCOUNTER — Other Ambulatory Visit: Payer: Self-pay

## 2019-01-13 ENCOUNTER — Encounter: Payer: Self-pay | Admitting: *Deleted

## 2019-01-13 ENCOUNTER — Encounter: Payer: Self-pay | Admitting: Cardiology

## 2019-01-13 VITALS — BP 176/80 | HR 68 | Ht 59.0 in | Wt 174.2 lb

## 2019-01-13 DIAGNOSIS — I10 Essential (primary) hypertension: Secondary | ICD-10-CM | POA: Diagnosis not present

## 2019-01-13 DIAGNOSIS — R072 Precordial pain: Secondary | ICD-10-CM | POA: Diagnosis not present

## 2019-01-13 DIAGNOSIS — E781 Pure hyperglyceridemia: Secondary | ICD-10-CM

## 2019-01-13 DIAGNOSIS — R0789 Other chest pain: Secondary | ICD-10-CM | POA: Diagnosis not present

## 2019-01-13 NOTE — Progress Notes (Signed)
Cardiology Office Note:    Date:  01/13/2019   ID:  Lacey Brown, DOB 08/22/1940, MRN [id number]  PCP:  Lacey Fenton, NP  Cardiologist:  Lacey Furbish, MD  Electrophysiologist:  None   Referring MD: Lacey Fenton, NP     History of Present Illness:    Lacey Brown is a 78 y.o. female here for the evaluation of chest pain at the request of Lacey Silversmith, NP.  She has hypertension asthma arthritis.  She is concerned about chest tightness and shortness of breath. Tightness going up hill.  Does not exercise routinely.  This seems to occur when walking quickly and tightness can last about 1 to 3 minutes but stops when she rests.  No orthopnea no PND no syncope no bleeding.    Former smoker years ago.  Both her mother and father had heart attacks, 54 and 46.  LDL 112 HDL 72 hemoglobin 12.7 serum sodium 132  Past Medical History:  Diagnosis Date   Arthritis    back, neck; right shoulder;    Asthma    using advair prn   Chicken pox    Hyperlipidemia    Hypertension    controlled with medication;     Past Surgical History:  Procedure Laterality Date   BREAST EXCISIONAL BIOPSY Left    TONSILLECTOMY AND ADENOIDECTOMY      Current Medications: Current Meds  Medication Sig   Cholecalciferol (VITAMIN D3) 2000 UNITS TABS Take 1 tablet by mouth daily.   Fluticasone-Salmeterol (ADVAIR DISKUS) 250-50 MCG/DOSE AEPB TAKE 1 PUFF BY MOUTH TWICE A DAY **RINSE MOUTH AFTER USE**   ibuprofen (ADVIL,MOTRIN) 400 MG tablet Take 400 mg by mouth daily as needed.    losartan (COZAAR) 50 MG tablet Take 1 tablet (50 mg total) by mouth daily.   metoprolol succinate (TOPROL-XL) 25 MG 24 hr tablet TAKE 1 TABLET BY MOUTH EVERY DAY   Multiple Minerals-Vitamins (CALCIUM CITRATE PLUS PO) Take 1 tablet by mouth daily.   Omega-3 Fatty Acids (FISH OIL) 1000 MG CAPS Take 1 capsule by mouth daily.   VENTOLIN HFA 108 (90 Base) MCG/ACT inhaler TAKE 2 PUFFS BY MOUTH EVERY 6 HOURS  AS NEEDED FOR WHEEZE OR SHORTNESS OF BREATH   vitamin C (ASCORBIC ACID) 500 MG tablet Take 1,000 mg by mouth daily as needed.      Allergies:   Ace inhibitors and Codeine   Social History   Socioeconomic History   Marital status: Widowed    Spouse name: Not on file   Number of children: Not on file   Years of education: Not on file   Highest education level: Not on file  Occupational History   Not on file  Social Needs   Financial resource strain: Not on file   Food insecurity    Worry: Not on file    Inability: Not on file   Transportation needs    Medical: Not on file    Non-medical: Not on file  Tobacco Use   Smoking status: Former Smoker    Quit date: 04/17/2009    Years since quitting: 9.7   Smokeless tobacco: Never Used  Substance and Sexual Activity   Alcohol use: Yes    Alcohol/week: 0.0 standard drinks    Comment: occasional   Drug use: No   Sexual activity: Never  Lifestyle   Physical activity    Days per week: Not on file    Minutes per session: Not on file   Stress: Not  on file  Relationships   Social connections    Talks on phone: Not on file    Gets together: Not on file    Attends religious service: Not on file    Active member of club or organization: Not on file    Attends meetings of clubs or organizations: Not on file    Relationship status: Not on file  Other Topics Concern   Not on file  Social History Narrative   Not on file     Family History: [name] family history includes Arthritis in her maternal grandmother; Heart disease in her father and mother. There is no history of Diabetes, Cancer, or Stroke.  ROS:   Please see the history of present illness.     All other systems reviewed and are negative.  EKGs/Labs/Other Studies Reviewed:    The following studies were reviewed today: No other cardiac studies other than EKG below  EKG: Sinus rhythm 64 with no other abnormalities personally reviewed  Recent  Labs: 12/01/2018: ALT 15; BUN 16; Creatinine, Ser 0.61; Hemoglobin 12.7; Platelets 220.0; Potassium 5.0; Sodium 132  Recent Lipid Panel    Component Value Date/Time   CHOL 196 12/01/2018 1305   TRIG 56.0 12/01/2018 1305   HDL 72.20 12/01/2018 1305   CHOLHDL 3 12/01/2018 1305   VLDL 11.2 12/01/2018 1305   LDLCALC 112 (H) 12/01/2018 1305    Physical Exam:    VS:  BP (!) 176/80    Pulse 68    Ht 4\' 11"  (1.499 m)    Wt 174 lb 3.2 oz (79 kg)    SpO2 96%    BMI 35.18 kg/m     Wt Readings from Last 3 Encounters:  01/13/19 174 lb 3.2 oz (79 kg)  12/01/18 177 lb (80.3 kg)  08/26/18 175 lb (79.4 kg)     GEN:  Well nourished, well developed in no acute distress, overweight HEENT: Normal NECK: No JVD; No carotid bruits LYMPHATICS: No lymphadenopathy CARDIAC: RRR, no murmurs, rubs, gallops RESPIRATORY:  Clear to auscultation without rales, wheezing or rhonchi  ABDOMEN: Soft, non-tender, non-distended MUSCULOSKELETAL:  No edema; No deformity  SKIN: Warm and dry NEUROLOGIC:  Alert and oriented x 3 PSYCHIATRIC:  Normal affect   ASSESSMENT:    1. Atypical chest pain   2. Pure hypertriglyceridemia   3. Essential hypertension   4. Morbid obesity ([place])   5. Precordial pain    PLAN:    In order of problems listed above:  Chest pain/shortness of breath -Has cardiac risk factors of age hypertension hyperlipidemia.  Does have some exertional component to this.  I think we will start with an echocardiogram to ensure proper structure and function of her heart and we will check a pharmacologic stress test to ensure that there is no high risk ischemia present.  Otherwise, continue with conditioning efforts, weight loss, blood pressure control etc.  Essential hypertension -Controlled by Lacey Silversmith, NP today a little bit high.  She may be drinking too many liquids throughout the day, unsweetened tea for instance.  Perhaps she should try to restrict to 1.5 L or 48 ounces.  This may help with  her mild hyponatremia as well.  Hyperlipidemia -Diet, exercise  Morbid obesity -BMI greater than 35 with 2 or more comorbidities.  Continue to encourage weight loss.  Medication Adjustments/Labs and Tests Ordered: Current medicines are reviewed at length with the patient today.  Concerns regarding medicines are outlined above.  Orders Placed This Encounter  Procedures  Myocardial Perfusion Imaging   ECHOCARDIOGRAM COMPLETE   No orders of the defined types were placed in this encounter.   Patient Instructions  Medication Instructions:   Your physician recommends that you continue on your current medications as directed. Please refer to the Current Medication list given to you today.  *If you need a refill on your cardiac medications before your next appointment, please call your pharmacy*    Testing/Procedures:  Your physician has requested that you have an echocardiogram. Echocardiography is a painless test that uses sound waves to create images of your heart. It provides your doctor with information about the size and shape of your heart and how well your hearts chambers and valves are working. This procedure takes approximately one hour. There are no restrictions for this procedure.   Your physician has requested that you have a lexiscan myoview. For further information please visit [email or web address]. Please follow instruction sheet, as given.    Follow-Up:  AS NEEDED WITH DR. Marlou Porch   Other Instructions  DR. Anterio Scheel WOULD LIKE FOR YOU TO RESTRICT YOUR FLUID INTAKE TO 1.5 LITERS PER DAY---THAT WOULD BE THE EQUIVALENT OF DRINKING THREE 16.9 FL OZ BOTTLES OF WATER A DAY     Signed, Lacey Furbish, MD  01/13/2019 3:38 PM    [facility]

## 2019-01-13 NOTE — Patient Instructions (Signed)
Medication Instructions:   Your physician recommends that you continue on your current medications as directed. Please refer to the Current Medication list given to you today.  *If you need a refill on your cardiac medications before your next appointment, please call your pharmacy*    Testing/Procedures:  Your physician has requested that you have an echocardiogram. Echocardiography is a painless test that uses sound waves to create images of your heart. It provides your doctor with information about the size and shape of your heart and how well your heart's chambers and valves are working. This procedure takes approximately one hour. There are no restrictions for this procedure.   Your physician has requested that you have a lexiscan myoview. For further information please visit [email or web address]. Please follow instruction sheet, as given.    Follow-Up:  AS NEEDED WITH DR. Marlou Porch   Other Instructions  DR. SKAINS WOULD LIKE FOR YOU TO RESTRICT YOUR FLUID INTAKE TO 1.5 LITERS PER DAY---THAT WOULD BE THE EQUIVALENT OF DRINKING THREE 16.9 FL OZ BOTTLES OF WATER A DAY

## 2019-01-19 ENCOUNTER — Ambulatory Visit: Payer: Self-pay | Admitting: *Deleted

## 2019-01-19 ENCOUNTER — Ambulatory Visit (INDEPENDENT_AMBULATORY_CARE_PROVIDER_SITE_OTHER): Payer: Medicare Other | Admitting: Internal Medicine

## 2019-01-19 ENCOUNTER — Encounter: Payer: Self-pay | Admitting: Internal Medicine

## 2019-01-19 ENCOUNTER — Other Ambulatory Visit: Payer: Self-pay

## 2019-01-19 VITALS — BP 146/84 | HR 74 | Temp 97.6°F | Wt 176.0 lb

## 2019-01-19 DIAGNOSIS — I10 Essential (primary) hypertension: Secondary | ICD-10-CM

## 2019-01-19 DIAGNOSIS — R42 Dizziness and giddiness: Secondary | ICD-10-CM

## 2019-01-19 MED ORDER — LOSARTAN POTASSIUM 100 MG PO TABS: 100.0000 mg | ORAL_TABLET | Freq: Every day | ORAL | 0 refills | Status: DC

## 2019-01-19 NOTE — Telephone Encounter (Signed)
Robin with the [facility] asked me to triage this pt.  Pt is c/o being lightheaded for the last week.   "I notice it when I get up from standing but not when I get up in the morning".    "I think it's my BP but I don't really know".    She was requesting to come in for a BP check.    She has not passed out or been sick with diarrhea or vomiting recently.    I scheduled her with Webb Silversmith, NP for today at 12:00 Noon.   Pt knows to arrive at 11:45 to be registered.   I went over the symptoms to watch for and go to the ED if they occur prior to her appt.    Passes out, dizziness, having to hold onto things to ambulate, shortness of breath or chest pressure/pain.   She verbalized understanding.   The COVID-19 screening questions were completed.  I sent my notes to [facility] office.    Reason for Disposition . [1] MODERATE dizziness (e.g., interferes with normal activities) AND [2] has NOT been evaluated by physician for this  (Exception: dizziness caused by heat exposure, sudden standing, or poor fluid intake)  Answer Assessment - Initial Assessment Questions 1. DESCRIPTION: "Describe your dizziness."     I'm lightheaded for the last week.    I saw a doctor a week ago for a cardiology.  They are going to do a stress test on me.   My BP is up.   2. LIGHTHEADED: "Do you feel lightheaded?" (e.g., somewhat faint, woozy, weak upon standing)     I feel light headed when I get up from sitting.   The light headedness comes and goes.   I do not have a  BP machine. 3. VERTIGO: "Do you feel like either you or the room is spinning or tilting?" (i.e. vertigo)     No 4. SEVERITY: "How bad is it?"  "Do you feel like you are going to faint?" "Can you stand and walk?"   - MILD - walking normally   - MODERATE - interferes with normal activities (e.g., work, school)    - SEVERE - unable to stand, requires support to walk, feels like passing out now.      I don't have to hold on to  anything to walk around.    5. ONSET:  "When did the dizziness begin?"     For the last week. 6. AGGRAVATING FACTORS: "Does anything make it worse?" (e.g., standing, change in head position)     Getting up from sitting.    7. HEART RATE: "Can you tell me your heart rate?" "How many beats in 15 seconds?"  (Note: not all patients can do this)       I saw cardiologist last week. 8. CAUSE: "What do you think is causing the dizziness?"     My BP maybe. 9. RECURRENT SYMPTOM: "Have you had dizziness before?" If so, ask: "When was the last time?" "What happened that time?"     No 10. OTHER SYMPTOMS: "Do you have any other symptoms?" (e.g., fever, chest pain, vomiting, diarrhea, bleeding)       I drink a lot of unsweet tea and water.   No diarrhea. 11. PREGNANCY: "Is there any chance you are pregnant?" "When was your last menstrual period?"       N/A due to age  Protocols used: DIZZINESS [facility]

## 2019-01-19 NOTE — Progress Notes (Signed)
Subjective:    Patient ID: Lacey Brown, female    DOB: [birth year], 78 y.o.   MRN: [id number]  HPI  Pt presents to the clinic today with c/o lightheadedness. This started 1 week ago. It is worse with changing from a sitting to standing position. She denies chest pain or shortness of breath. She does have a history of HTN, currently managed on Losartan and Metoprolol. She has had her eyes checked 11/2018, no new change in her contact prescriptions. She does need cataract surgery. Her BP today is 148/86. Her BP at cardiology last week was 176/80.  Review of Systems  Past Medical History:  Diagnosis Date  . Arthritis    back, neck; right shoulder;   . Asthma    using advair prn  . Chicken pox   . Hyperlipidemia   . Hypertension    controlled with medication;     Current Outpatient Medications  Medication Sig Dispense Refill  . Cholecalciferol (VITAMIN D3) 2000 UNITS TABS Take 1 tablet by mouth daily.    . Fluticasone-Salmeterol (ADVAIR DISKUS) 250-50 MCG/DOSE AEPB TAKE 1 PUFF BY MOUTH TWICE A DAY **RINSE MOUTH AFTER USE** 180 each 2  . ibuprofen (ADVIL,MOTRIN) 400 MG tablet Take 400 mg by mouth daily as needed.     Marland Kitchen losartan (COZAAR) 50 MG tablet Take 1 tablet (50 mg total) by mouth daily. 90 tablet 0  . metoprolol succinate (TOPROL-XL) 25 MG 24 hr tablet TAKE 1 TABLET BY MOUTH EVERY DAY 90 tablet 0  . Multiple Minerals-Vitamins (CALCIUM CITRATE PLUS PO) Take 1 tablet by mouth daily.    . Omega-3 Fatty Acids (FISH OIL) 1000 MG CAPS Take 1 capsule by mouth daily.    . VENTOLIN HFA 108 (90 Base) MCG/ACT inhaler TAKE 2 PUFFS BY MOUTH EVERY 6 HOURS AS NEEDED FOR WHEEZE OR SHORTNESS OF BREATH 1 Inhaler 2  . vitamin C (ASCORBIC ACID) 500 MG tablet Take 1,000 mg by mouth daily as needed.      No current facility-administered medications for this visit.    Allergies  Allergen Reactions  . Ace Inhibitors Cough    Other reaction(s): Cough  . Codeine Nausea Only    Family  History  Problem Relation Age of Onset  . Heart disease Mother   . Heart disease Father   . Arthritis Maternal Grandmother   . Diabetes Neg Hx   . Cancer Neg Hx   . Stroke Neg Hx     Social History   Socioeconomic History  . Marital status: Widowed    Spouse name: Not on file  . Number of children: Not on file  . Years of education: Not on file  . Highest education level: Not on file  Occupational History  . Not on file  Tobacco Use  . Smoking status: Former Smoker    Quit date: 04/17/2009    Years since quitting: 9.7  . Smokeless tobacco: Never Used  Substance and Sexual Activity  . Alcohol use: Yes    Alcohol/week: 0.0 standard drinks    Comment: occasional  . Drug use: No  . Sexual activity: Never  Other Topics Concern  . Not on file  Social History Narrative  . Not on file   Social Determinants of Health   Financial Resource Strain:   . Difficulty of Paying Living Expenses: Not on file  Food Insecurity:   . Worried About Charity fundraiser in the Last Year: Not on file  . Ran Out of  Food in the Last Year: Not on file  Transportation Needs:   . Lack of Transportation (Medical): Not on file  . Lack of Transportation (Non-Medical): Not on file  Physical Activity:   . Days of Exercise per Week: Not on file  . Minutes of Exercise per Session: Not on file  Stress:   . Feeling of Stress : Not on file  Social Connections:   . Frequency of Communication with Friends and Family: Not on file  . Frequency of Social Gatherings with Friends and Family: Not on file  . Attends Religious Services: Not on file  . Active Member of Clubs or Organizations: Not on file  . Attends Archivist Meetings: Not on file  . Marital Status: Not on file  Intimate Partner Violence:   . Fear of Current or Ex-Partner: Not on file  . Emotionally Abused: Not on file  . Physically Abused: Not on file  . Sexually Abused: Not on file     Constitutional: Denies fever, malaise,  fatigue, headache or abrupt weight changes.  Respiratory: Denies difficulty breathing, shortness of breath, cough or sputum production.   Cardiovascular: Denies chest pain, chest tightness, palpitations or swelling in the hands or feet.  Neurological: Pt reports lightheadedness. Denies dizziness, difficulty with memory, difficulty with speech or problems with balance and coordination.    No other specific complaints in a complete review of systems (except as listed in HPI above).     Objective:   Physical Exam  BP (!) 146/84   Pulse 74   Temp 97.6 F (36.4 C) (Temporal)   Wt 176 lb (79.8 kg)   SpO2 98%   BMI 35.55 kg/m   Wt Readings from Last 3 Encounters:  01/13/19 174 lb 3.2 oz (79 kg)  12/01/18 177 lb (80.3 kg)  08/26/18 175 lb (79.4 kg)    General: Appears her stated age, obese, in NAD. HEENT: Head: normal shape and size; Eyes: sclera white, no icterus, conjunctiva pink, PERRLA and EOMs intact;  Neck:  Neck supple, trachea midline. No masses, lumps or thyromegaly present.  Cardiovascular: Normal rate and rhythm. S1,S2 noted.  No murmur, rubs or gallops noted. No JVD or BLE edema. No carotid bruits noted. Pulmonary/Chest: Normal effort and positive vesicular breath sounds. No respiratory distress. No wheezes, rales or ronchi noted.  Neurological: Alert and oriented. Coordination normal.    BMET    Component Value Date/Time   NA 132 (L) 12/01/2018 1305   K 5.0 12/01/2018 1305   CL 98 12/01/2018 1305   CO2 31 12/01/2018 1305   GLUCOSE 99 12/01/2018 1305   BUN 16 12/01/2018 1305   CREATININE 0.61 12/01/2018 1305   CALCIUM 9.0 12/01/2018 1305    Lipid Panel     Component Value Date/Time   CHOL 196 12/01/2018 1305   TRIG 56.0 12/01/2018 1305   HDL 72.20 12/01/2018 1305   CHOLHDL 3 12/01/2018 1305   VLDL 11.2 12/01/2018 1305   LDLCALC 112 (H) 12/01/2018 1305    CBC    Component Value Date/Time   WBC 7.3 12/01/2018 1305   RBC 4.13 12/01/2018 1305   HGB 12.7  12/01/2018 1305   HCT 38.6 12/01/2018 1305   PLT 220.0 12/01/2018 1305   MCV 93.2 12/01/2018 1305   MCHC 32.9 12/01/2018 1305   RDW 13.4 12/01/2018 1305    Hgb A1C No results found for: HGBA1C         Assessment & Plan:   Lightheadedness, HTN:  Orthostatics  negative Recheck BP by provider 163/90 Increase Losartan to 100 mg daily Continue Metoprolol Reinforced DASH diet   RTC in 2 weeks for HTN followup, sooner if needed  Webb Silversmith, NP This visit occurred during the SARS-CoV-2 public health emergency.  Safety protocols were in place, including screening questions prior to the visit, additional usage of staff PPE, and extensive cleaning of exam room while observing appropriate contact time as indicated for disinfecting solutions.

## 2019-01-19 NOTE — Patient Instructions (Signed)

## 2019-01-21 ENCOUNTER — Telehealth: Payer: Self-pay | Admitting: Internal Medicine

## 2019-01-21 ENCOUNTER — Telehealth (HOSPITAL_COMMUNITY): Payer: Self-pay

## 2019-01-21 ENCOUNTER — Other Ambulatory Visit: Payer: Self-pay

## 2019-01-21 ENCOUNTER — Ambulatory Visit (INDEPENDENT_AMBULATORY_CARE_PROVIDER_SITE_OTHER): Payer: Medicare Other

## 2019-01-21 DIAGNOSIS — M810 Age-related osteoporosis without current pathological fracture: Secondary | ICD-10-CM

## 2019-01-21 MED ORDER — DENOSUMAB 60 MG/ML ~~LOC~~ SOSY
60.0000 mg | PREFILLED_SYRINGE | Freq: Once | SUBCUTANEOUS | Status: AC
  Administered 2019-01-21: 14:00:00 60 mg via SUBCUTANEOUS

## 2019-01-21 NOTE — Telephone Encounter (Signed)
Pt is aware as instructed and expressed understanding 

## 2019-01-21 NOTE — Telephone Encounter (Signed)
Patient saw Rollene Fare for her blood pressure on Tuesday.  Patient's Losartan was increased.  Patient wants to know how long it would take for it to start working.  Patient said she has a slight headache.  Patient can't take her blood pressure at home. Please is requesting Threasa Beards call her back today. Patient uses [facility].

## 2019-01-21 NOTE — Telephone Encounter (Signed)
Spoke with the patient, instructions were given. She stated she would be here for her test. Asked to call back with any questions. S.Aliya Sol EMTP

## 2019-01-21 NOTE — Telephone Encounter (Signed)
Pt received Prolia inj today.  

## 2019-01-21 NOTE — Progress Notes (Signed)
Per orders of NP, [employer] injection of Prolia given by [employer]. Patient tolerated injection well.

## 2019-01-22 ENCOUNTER — Ambulatory Visit: Payer: Medicare Other | Attending: Internal Medicine

## 2019-01-22 DIAGNOSIS — Z20822 Contact with and (suspected) exposure to covid-19: Secondary | ICD-10-CM

## 2019-01-22 NOTE — Addendum Note (Signed)
Addended by: Ivin Poot A on: 01/22/2019 11:52 AM   Modules accepted: Orders

## 2019-01-23 LAB — NOVEL CORONAVIRUS, NAA: SARS-CoV-2, NAA: NOT DETECTED

## 2019-01-25 ENCOUNTER — Telehealth: Payer: Self-pay

## 2019-01-25 NOTE — Telephone Encounter (Signed)
Pt notified of negative COVID-19 results. Understanding verbalized.  Chasta M Hopkins   

## 2019-01-27 ENCOUNTER — Telehealth (HOSPITAL_COMMUNITY): Payer: Self-pay | Admitting: *Deleted

## 2019-01-27 ENCOUNTER — Other Ambulatory Visit: Payer: Self-pay

## 2019-01-27 ENCOUNTER — Ambulatory Visit (HOSPITAL_COMMUNITY): Payer: Medicare Other | Attending: Cardiology

## 2019-01-27 ENCOUNTER — Encounter (HOSPITAL_COMMUNITY): Payer: Medicare Other

## 2019-01-27 DIAGNOSIS — R0789 Other chest pain: Secondary | ICD-10-CM

## 2019-01-27 NOTE — Telephone Encounter (Signed)
Left message on voicemail in reference to upcoming appointment scheduled for 02/03/19. Phone number given for a call back so details instructions can be given. Lacey Brown

## 2019-01-28 ENCOUNTER — Other Ambulatory Visit: Payer: Self-pay | Admitting: *Deleted

## 2019-01-28 DIAGNOSIS — I10 Essential (primary) hypertension: Secondary | ICD-10-CM

## 2019-01-28 DIAGNOSIS — I77810 Thoracic aortic ectasia: Secondary | ICD-10-CM

## 2019-02-02 ENCOUNTER — Other Ambulatory Visit: Payer: Self-pay

## 2019-02-02 ENCOUNTER — Ambulatory Visit (INDEPENDENT_AMBULATORY_CARE_PROVIDER_SITE_OTHER): Payer: Medicare Other | Admitting: Internal Medicine

## 2019-02-02 ENCOUNTER — Encounter: Payer: Self-pay | Admitting: Internal Medicine

## 2019-02-02 VITALS — BP 146/84 | HR 68 | Temp 98.0°F | Wt 175.0 lb

## 2019-02-02 DIAGNOSIS — I10 Essential (primary) hypertension: Secondary | ICD-10-CM | POA: Diagnosis not present

## 2019-02-02 DIAGNOSIS — Z0289 Encounter for other administrative examinations: Secondary | ICD-10-CM

## 2019-02-02 DIAGNOSIS — J4540 Moderate persistent asthma, uncomplicated: Secondary | ICD-10-CM | POA: Diagnosis not present

## 2019-02-02 MED ORDER — AMLODIPINE BESYLATE 5 MG PO TABS: 5.0000 mg | ORAL_TABLET | Freq: Every day | ORAL | 3 refills | Status: DC

## 2019-02-02 MED ORDER — LOSARTAN POTASSIUM 100 MG PO TABS: 100.0000 mg | ORAL_TABLET | Freq: Every day | ORAL | 3 refills | Status: DC

## 2019-02-02 NOTE — Progress Notes (Signed)
Subjective:    Patient ID: Lacey Brown, female    DOB: Mar 01, 1940, 78 y.o.   MRN: [id number]  HPI  Pt presents to the clinic today for 2 week follow up of HTN. At her last visit, her Losartan was increase to 100 mg daily. She is taking Metoprolol as prescribed. She reports she has noticed less lightheadedness but still fills foggy at times. She denies adverse side effects. Her BP today is 158/90. She is seeing cardiology once yearly, recently had a normal echo.  She would like an extension to keep her out of work due to [place], due to asthma.  She would like a handicap placard filled out due to her respiratory status which worsens with long distances.  Review of Systems      Past Medical History:  Diagnosis Date  . Arthritis    back, neck; right shoulder;   . Asthma    using advair prn  . Chicken pox   . Hyperlipidemia   . Hypertension    controlled with medication;     Current Outpatient Medications  Medication Sig Dispense Refill  . Cholecalciferol (VITAMIN D3) 2000 UNITS TABS Take 1 tablet by mouth daily.    . Fluticasone-Salmeterol (ADVAIR DISKUS) 250-50 MCG/DOSE AEPB TAKE 1 PUFF BY MOUTH TWICE A DAY **RINSE MOUTH AFTER USE** 180 each 2  . ibuprofen (ADVIL,MOTRIN) 400 MG tablet Take 400 mg by mouth daily as needed.     Marland Kitchen losartan (COZAAR) 100 MG tablet Take 1 tablet (100 mg total) by mouth daily. 30 tablet 0  . metoprolol succinate (TOPROL-XL) 25 MG 24 hr tablet TAKE 1 TABLET BY MOUTH EVERY DAY 90 tablet 0  . Multiple Minerals-Vitamins (CALCIUM CITRATE PLUS PO) Take 1 tablet by mouth daily.    . Omega-3 Fatty Acids (FISH OIL) 1000 MG CAPS Take 1 capsule by mouth daily.    . VENTOLIN HFA 108 (90 Base) MCG/ACT inhaler TAKE 2 PUFFS BY MOUTH EVERY 6 HOURS AS NEEDED FOR WHEEZE OR SHORTNESS OF BREATH 1 Inhaler 2  . vitamin C (ASCORBIC ACID) 500 MG tablet Take 1,000 mg by mouth daily as needed.      No current facility-administered medications for this visit.     Allergies  Allergen Reactions  . Ace Inhibitors Cough    Other reaction(s): Cough  . Codeine Nausea Only    Family History  Problem Relation Age of Onset  . Heart disease Mother   . Heart disease Father   . Arthritis Maternal Grandmother   . Diabetes Neg Hx   . Cancer Neg Hx   . Stroke Neg Hx     Social History   Socioeconomic History  . Marital status: Widowed    Spouse name: Not on file  . Number of children: Not on file  . Years of education: Not on file  . Highest education level: Not on file  Occupational History  . Not on file  Tobacco Use  . Smoking status: Former Smoker    Quit date: 04/17/2009    Years since quitting: 9.8  . Smokeless tobacco: Never Used  Substance and Sexual Activity  . Alcohol use: Yes    Alcohol/week: 0.0 standard drinks    Comment: occasional  . Drug use: No  . Sexual activity: Never  Other Topics Concern  . Not on file  Social History Narrative  . Not on file   Social Determinants of Health   Financial Resource Strain:   . Difficulty of Paying  Living Expenses: Not on file  Food Insecurity:   . Worried About Charity fundraiser in the Last Year: Not on file  . Ran Out of Food in the Last Year: Not on file  Transportation Needs:   . Lack of Transportation (Medical): Not on file  . Lack of Transportation (Non-Medical): Not on file  Physical Activity:   . Days of Exercise per Week: Not on file  . Minutes of Exercise per Session: Not on file  Stress:   . Feeling of Stress : Not on file  Social Connections:   . Frequency of Communication with Friends and Family: Not on file  . Frequency of Social Gatherings with Friends and Family: Not on file  . Attends Religious Services: Not on file  . Active Member of Clubs or Organizations: Not on file  . Attends Archivist Meetings: Not on file  . Marital Status: Not on file  Intimate Partner Violence:   . Fear of Current or Ex-Partner: Not on file  . Emotionally Abused:  Not on file  . Physically Abused: Not on file  . Sexually Abused: Not on file     Constitutional: Denies fever, malaise, fatigue, headache or abrupt weight changes.  Respiratory: Pt reports intermittent SOB. Denies difficulty breathing, cough or sputum production.   Cardiovascular: Denies chest pain, chest tightness, palpitations or swelling in the hands or feet.  Neurological: Denies dizziness, difficulty with memory, difficulty with speech or problems with balance and coordination.    No other specific complaints in a complete review of systems (except as listed in HPI above).  Objective:   Physical Exam  BP (!) 158/90 (BP Location: Right Arm, Patient Position: Sitting)   Pulse 68   Temp 98 F (36.7 C) (Temporal)   Wt 175 lb (79.4 kg)   SpO2 98%   BMI 35.35 kg/m   Wt Readings from Last 3 Encounters:  01/19/19 176 lb (79.8 kg)  01/13/19 174 lb 3.2 oz (79 kg)  12/01/18 177 lb (80.3 kg)    General: Appears her stated age, obese, in NAD. Cardiovascular: Normal rate and rhythm. S1,S2 noted.  No murmur, rubs or gallops noted. No JVD or BLE edema.  Pulmonary/Chest: Normal effort and positive vesicular breath sounds. No respiratory distress. No wheezes, rales or ronchi noted.  Neurological: Alert and oriented.    BMET    Component Value Date/Time   NA 132 (L) 12/01/2018 1305   K 5.0 12/01/2018 1305   CL 98 12/01/2018 1305   CO2 31 12/01/2018 1305   GLUCOSE 99 12/01/2018 1305   BUN 16 12/01/2018 1305   CREATININE 0.61 12/01/2018 1305   CALCIUM 9.0 12/01/2018 1305    Lipid Panel     Component Value Date/Time   CHOL 196 12/01/2018 1305   TRIG 56.0 12/01/2018 1305   HDL 72.20 12/01/2018 1305   CHOLHDL 3 12/01/2018 1305   VLDL 11.2 12/01/2018 1305   LDLCALC 112 (H) 12/01/2018 1305    CBC    Component Value Date/Time   WBC 7.3 12/01/2018 1305   RBC 4.13 12/01/2018 1305   HGB 12.7 12/01/2018 1305   HCT 38.6 12/01/2018 1305   PLT 220.0 12/01/2018 1305   MCV  93.2 12/01/2018 1305   MCHC 32.9 12/01/2018 1305   RDW 13.4 12/01/2018 1305    Hgb A1C No results found for: HGBA1C          Assessment & Plan:   Encounter for Form Completion:  Handicap placard form  filled out per request  Asthma:  Work extension given to keep pt out of work until May Continue Advair and Albuterol prn  Return precautions discussed  Webb Silversmith, NP This visit occurred during the SARS-CoV-2 public health emergency.  Safety protocols were in place, including screening questions prior to the visit, additional usage of staff PPE, and extensive cleaning of exam room while observing appropriate contact time as indicated for disinfecting solutions.

## 2019-02-02 NOTE — Assessment & Plan Note (Signed)
Still elevated Will add Amlodipine 5 mg daily She cancelled her appt with cardiology Reinforced DASH diet and exercise for weight loss CMET reviewed

## 2019-02-02 NOTE — Patient Instructions (Signed)

## 2019-02-03 ENCOUNTER — Encounter (HOSPITAL_COMMUNITY): Payer: Medicare Other

## 2019-02-03 ENCOUNTER — Telehealth: Payer: Self-pay

## 2019-02-03 ENCOUNTER — Other Ambulatory Visit: Payer: Self-pay | Admitting: Internal Medicine

## 2019-02-03 MED ORDER — LOSARTAN POTASSIUM 100 MG PO TABS: 100.0000 mg | ORAL_TABLET | Freq: Every day | ORAL | 0 refills | Status: DC

## 2019-02-03 NOTE — Addendum Note (Signed)
Addended by: Lurlean Nanny on: 02/03/2019 04:51 PM   Modules accepted: Orders

## 2019-02-03 NOTE — Telephone Encounter (Signed)
Patient contacted the office and asked to speak with Lacey Brown for a private matter regarding her appt yesterday. I advised her that I am a CMA, and I could try assist her with any clinical issues if she needed, and she stated that she only needed to speak with Lacey Brown because it is about something with her appointment she had yesterday. Will route to [facility]

## 2019-02-03 NOTE — Telephone Encounter (Signed)
Can you call and see what she needs?

## 2019-02-03 NOTE — Telephone Encounter (Signed)
Pt just had questions about blood pressure medications and schedule a 1 month f/u

## 2019-02-05 ENCOUNTER — Other Ambulatory Visit: Payer: Self-pay | Admitting: Internal Medicine

## 2019-02-14 ENCOUNTER — Other Ambulatory Visit: Payer: Self-pay | Admitting: Internal Medicine

## 2019-03-09 ENCOUNTER — Encounter: Payer: Self-pay | Admitting: Internal Medicine

## 2019-03-09 ENCOUNTER — Other Ambulatory Visit: Payer: Self-pay

## 2019-03-09 ENCOUNTER — Ambulatory Visit (INDEPENDENT_AMBULATORY_CARE_PROVIDER_SITE_OTHER): Payer: Medicare HMO | Admitting: Internal Medicine

## 2019-03-09 DIAGNOSIS — I10 Essential (primary) hypertension: Secondary | ICD-10-CM

## 2019-03-09 NOTE — Patient Instructions (Signed)

## 2019-03-09 NOTE — Assessment & Plan Note (Signed)
Controlled on Losartan, Metoprolol and Amlodipine Encouraged regular physical activity Will monitor

## 2019-03-09 NOTE — Progress Notes (Signed)
Subjective:    Patient ID: Lacey Brown, female    DOB: Jul 30, 1940, 79 y.o.   MRN: [id number]  HPI  Pt presents to the clinic today for 1 month follow up of HTN. At her last visit, Amlodipine was added to her Losartan and Metoprolol. She has been taking the medication as prescribed. She has some mild, intermittent leg swelling and dizziness if she stands up to fast, but denies adverse effects. Her BP today is 136/78. ECG from 03/2018 reviewed.  Review of Systems      Past Medical History:  Diagnosis Date  . Arthritis    back, neck; right shoulder;   . Asthma    using advair prn  . Chicken pox   . Hyperlipidemia   . Hypertension    controlled with medication;     Current Outpatient Medications  Medication Sig Dispense Refill  . amLODipine (NORVASC) 5 MG tablet Take 1 tablet (5 mg total) by mouth daily. 90 tablet 3  . Cholecalciferol (VITAMIN D3) 2000 UNITS TABS Take 1 tablet by mouth daily.    . Fluticasone-Salmeterol (ADVAIR DISKUS) 250-50 MCG/DOSE AEPB TAKE 1 PUFF BY MOUTH TWICE A DAY **RINSE MOUTH AFTER USE** 180 each 2  . ibuprofen (ADVIL,MOTRIN) 400 MG tablet Take 400 mg by mouth daily as needed.     Marland Kitchen losartan (COZAAR) 100 MG tablet Take 1 tablet (100 mg total) by mouth daily. 90 tablet 0  . metoprolol succinate (TOPROL-XL) 25 MG 24 hr tablet TAKE 1 TABLET BY MOUTH EVERY DAY 90 tablet 0  . Multiple Minerals-Vitamins (CALCIUM CITRATE PLUS PO) Take 1 tablet by mouth daily.    . Omega-3 Fatty Acids (FISH OIL) 1000 MG CAPS Take 1 capsule by mouth daily.    . VENTOLIN HFA 108 (90 Base) MCG/ACT inhaler TAKE 2 PUFFS BY MOUTH EVERY 6 HOURS AS NEEDED FOR WHEEZE OR SHORTNESS OF BREATH 1 Inhaler 2  . vitamin C (ASCORBIC ACID) 500 MG tablet Take 1,000 mg by mouth daily as needed.      No current facility-administered medications for this visit.    Allergies  Allergen Reactions  . Ace Inhibitors Cough    Other reaction(s): Cough  . Codeine Nausea Only    Family History   Problem Relation Age of Onset  . Heart disease Mother   . Heart disease Father   . Arthritis Maternal Grandmother   . Diabetes Neg Hx   . Cancer Neg Hx   . Stroke Neg Hx     Social History   Socioeconomic History  . Marital status: Widowed    Spouse name: Not on file  . Number of children: Not on file  . Years of education: Not on file  . Highest education level: Not on file  Occupational History  . Not on file  Tobacco Use  . Smoking status: Former Smoker    Quit date: 04/17/2009    Years since quitting: 9.8  . Smokeless tobacco: Never Used  Substance and Sexual Activity  . Alcohol use: Yes    Alcohol/week: 0.0 standard drinks    Comment: occasional  . Drug use: No  . Sexual activity: Never  Other Topics Concern  . Not on file  Social History Narrative  . Not on file   Social Determinants of Health   Financial Resource Strain:   . Difficulty of Paying Living Expenses: Not on file  Food Insecurity:   . Worried About Charity fundraiser in the Last Year: Not on  file  . [place] in the Last Year: Not on file  Transportation Needs:   . Lack of Transportation (Medical): Not on file  . Lack of Transportation (Non-Medical): Not on file  Physical Activity:   . Days of Exercise per Week: Not on file  . Minutes of Exercise per Session: Not on file  Stress:   . Feeling of Stress : Not on file  Social Connections:   . Frequency of Communication with Friends and Family: Not on file  . Frequency of Social Gatherings with Friends and Family: Not on file  . Attends Religious Services: Not on file  . Active Member of Clubs or Organizations: Not on file  . Attends Archivist Meetings: Not on file  . Marital Status: Not on file  Intimate Partner Violence:   . Fear of Current or Ex-Partner: Not on file  . Emotionally Abused: Not on file  . Physically Abused: Not on file  . Sexually Abused: Not on file     Constitutional: Denies fever, malaise, fatigue,  headache or abrupt weight changes.  Respiratory: Denies difficulty breathing, shortness of breath, cough or sputum production.   Cardiovascular: Pt reports intermittent swelling in her legs .Denies chest pain, chest tightness, palpitations or swelling in the hands.  Neurological: Pt reports intermittent dizziness. Denies difficulty with memory, difficulty with speech or problems with balance and coordination.    No other specific complaints in a complete review of systems (except as listed in HPI above).  Objective:   Physical Exam  BP 136/78   Pulse 70   Temp (!) 97 F (36.1 C) (Temporal)   Wt 177 lb (80.3 kg)   SpO2 97%   BMI 35.75 kg/m   Wt Readings from Last 3 Encounters:  02/02/19 175 lb (79.4 kg)  01/19/19 176 lb (79.8 kg)  01/13/19 174 lb 3.2 oz (79 kg)    General: Appears her stated age, well developed, well nourished in NAD. Cardiovascular: Normal rate and rhythm. S1,S2 noted.  No murmur, rubs or gallops noted. Trace, nonpitting BLE edema.  Pulmonary/Chest: Normal effort and positive vesicular breath sounds. No respiratory distress. No wheezes, rales or ronchi noted.  Neurological: Alert and oriented. Cranial nerves II-XII grossly intact. Coordination normal.    BMET    Component Value Date/Time   NA 132 (L) 12/01/2018 1305   K 5.0 12/01/2018 1305   CL 98 12/01/2018 1305   CO2 31 12/01/2018 1305   GLUCOSE 99 12/01/2018 1305   BUN 16 12/01/2018 1305   CREATININE 0.61 12/01/2018 1305   CALCIUM 9.0 12/01/2018 1305    Lipid Panel     Component Value Date/Time   CHOL 196 12/01/2018 1305   TRIG 56.0 12/01/2018 1305   HDL 72.20 12/01/2018 1305   CHOLHDL 3 12/01/2018 1305   VLDL 11.2 12/01/2018 1305   LDLCALC 112 (H) 12/01/2018 1305    CBC    Component Value Date/Time   WBC 7.3 12/01/2018 1305   RBC 4.13 12/01/2018 1305   HGB 12.7 12/01/2018 1305   HCT 38.6 12/01/2018 1305   PLT 220.0 12/01/2018 1305   MCV 93.2 12/01/2018 1305   MCHC 32.9 12/01/2018  1305   RDW 13.4 12/01/2018 1305    Hgb A1C No results found for: HGBA1C         Assessment & Plan:    Webb Silversmith, NP This visit occurred during the SARS-CoV-2 public health emergency.  Safety protocols were in place, including screening questions prior  to the visit, additional usage of staff PPE, and extensive cleaning of exam room while observing appropriate contact time as indicated for disinfecting solutions.

## 2019-03-29 ENCOUNTER — Telehealth: Payer: Self-pay | Admitting: Internal Medicine

## 2019-03-29 NOTE — Telephone Encounter (Signed)
Patient called today to check status of prolia. She stated that she was in on 2/2 and discussed starting injections but has not heard back from anyone  Please advise

## 2019-03-29 NOTE — Telephone Encounter (Signed)
Discussed w/pt.  Pt not due for next Prolia injection until 07-23-19.  Pt aware we will contact her in May to discuss benefits and schedule next injection.

## 2019-04-26 ENCOUNTER — Other Ambulatory Visit: Payer: Self-pay | Admitting: Internal Medicine

## 2019-04-26 ENCOUNTER — Ambulatory Visit (INDEPENDENT_AMBULATORY_CARE_PROVIDER_SITE_OTHER): Payer: Medicare HMO | Admitting: Internal Medicine

## 2019-04-26 ENCOUNTER — Encounter: Payer: Self-pay | Admitting: Internal Medicine

## 2019-04-26 ENCOUNTER — Other Ambulatory Visit: Payer: Self-pay

## 2019-04-26 ENCOUNTER — Ambulatory Visit (INDEPENDENT_AMBULATORY_CARE_PROVIDER_SITE_OTHER)
Admission: RE | Admit: 2019-04-26 | Discharge: 2019-04-26 | Disposition: A | Discharge status: Home / Self Care | Payer: Medicare HMO | Source: Ambulatory Visit | Attending: Internal Medicine | Admitting: Internal Medicine

## 2019-04-26 VITALS — BP 136/86 | HR 77 | Temp 97.9°F | Wt 177.0 lb

## 2019-04-26 DIAGNOSIS — M25562 Pain in left knee: Secondary | ICD-10-CM

## 2019-04-26 DIAGNOSIS — M79662 Pain in left lower leg: Secondary | ICD-10-CM

## 2019-04-26 DIAGNOSIS — M79605 Pain in left leg: Secondary | ICD-10-CM | POA: Diagnosis not present

## 2019-04-26 IMAGING — DX DG KNEE COMPLETE 4+V*L*
4 series · 4 of 4 positions shown · non-contrast
Comparison: None.

CLINICAL DATA: Left knee pain, no known injury, initial encounter

EXAM:
LEFT KNEE - COMPLETE 4+ VIEW

[knee ap]
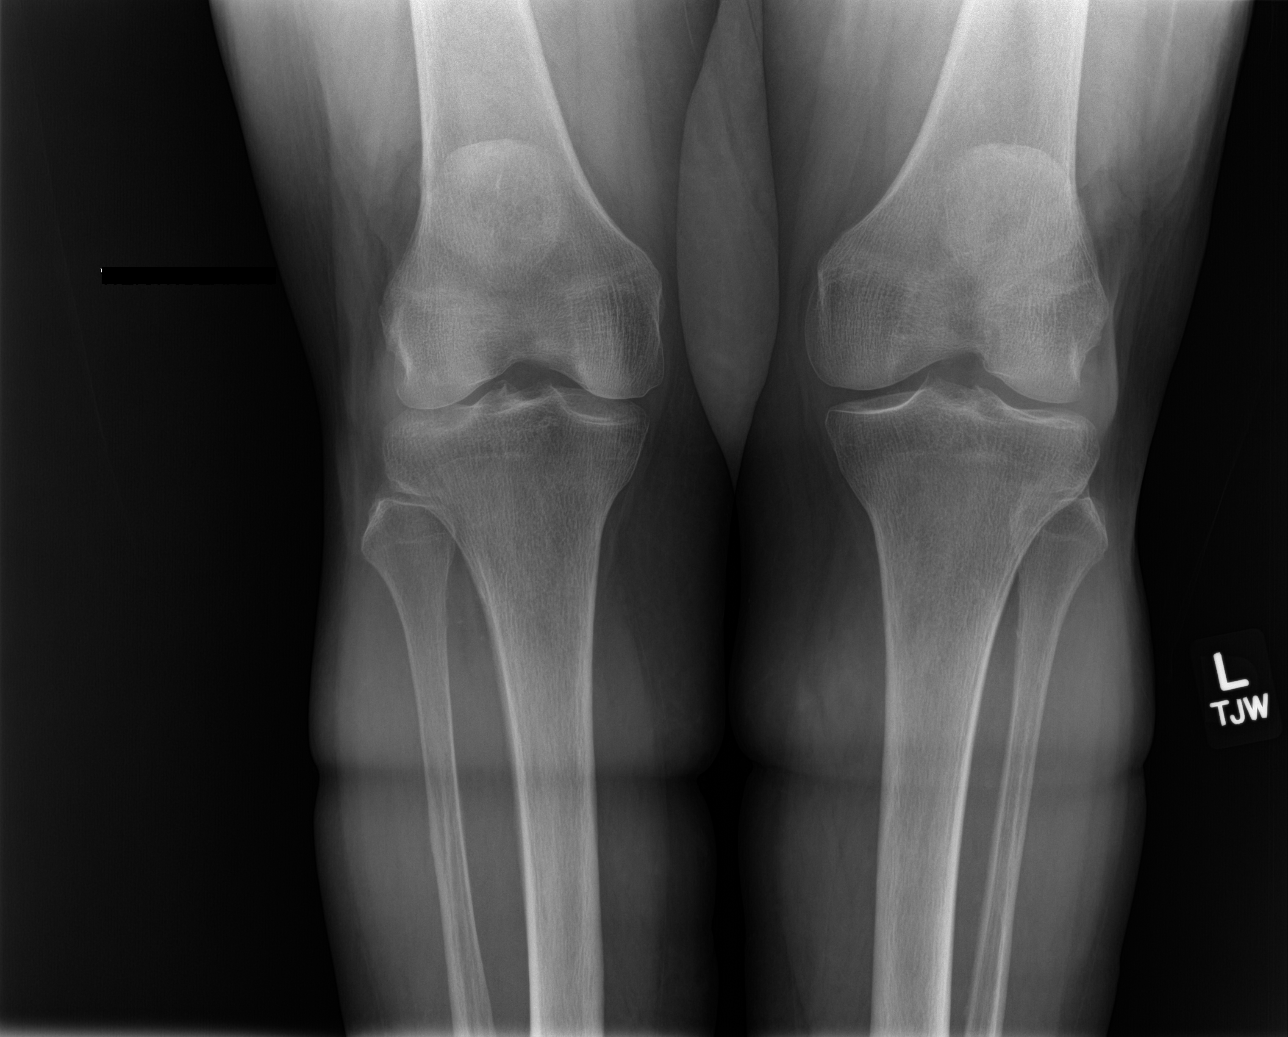

[knee tunnel]
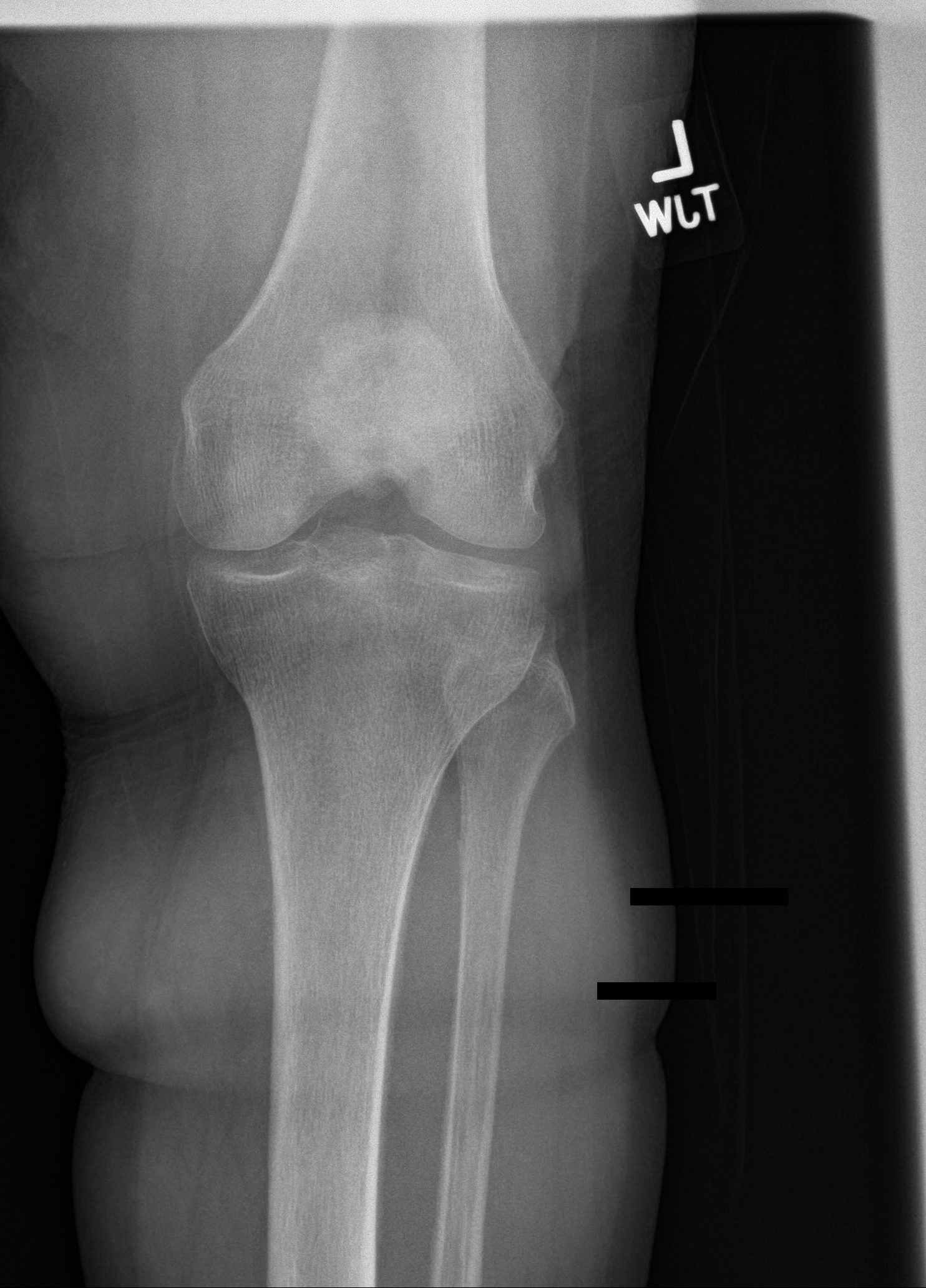

[knee lat]
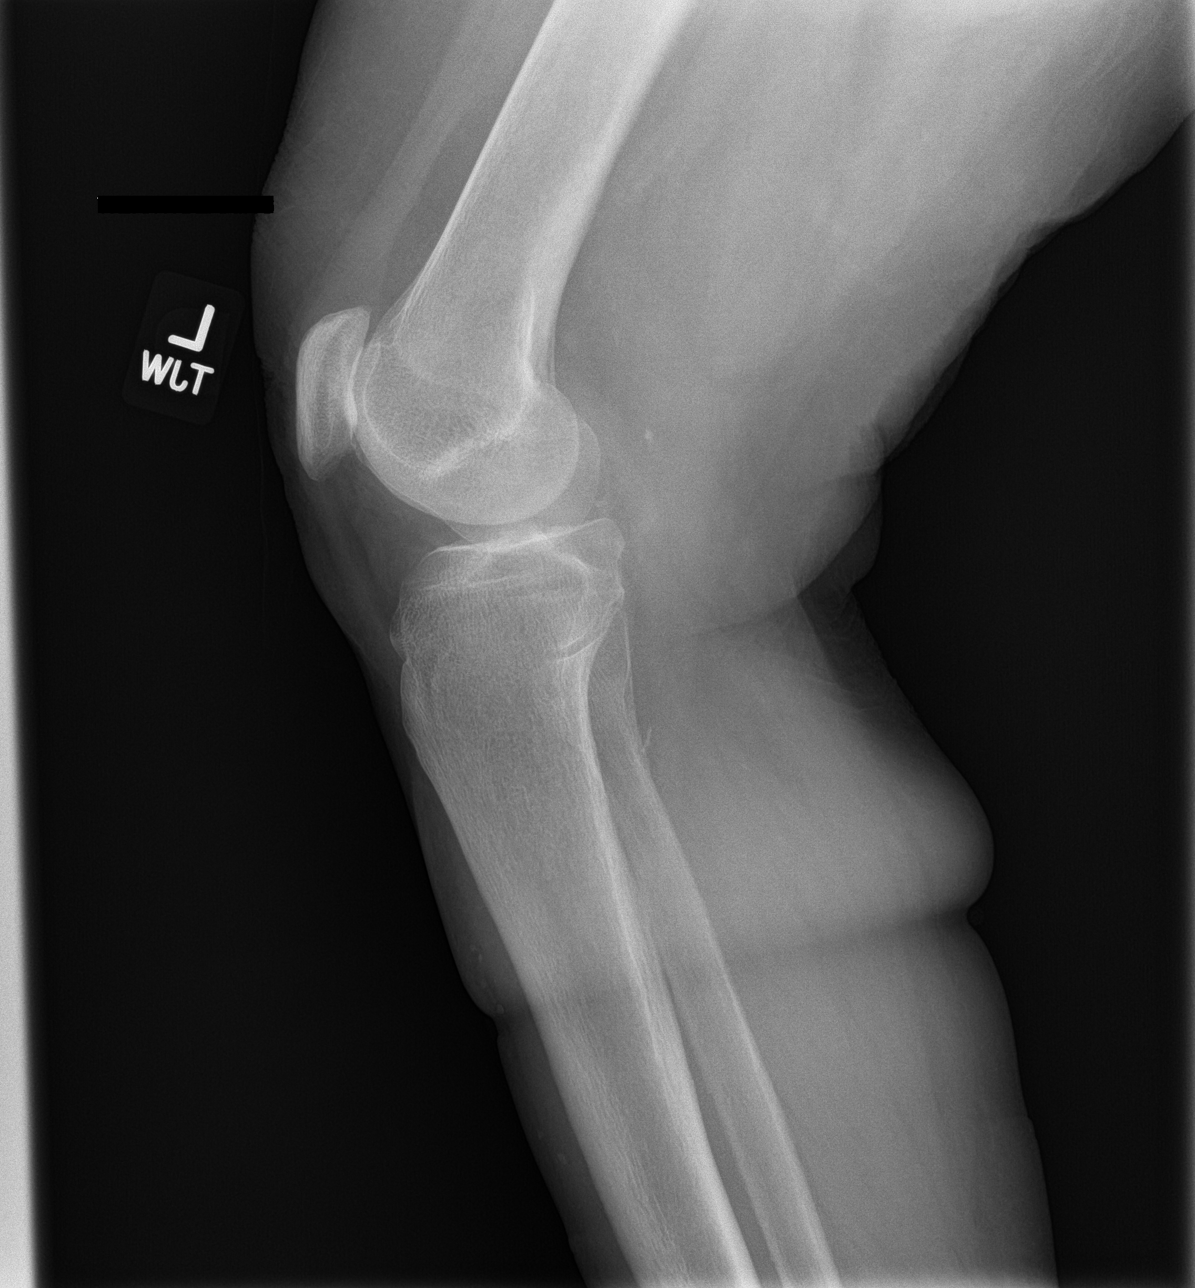

[patella skyline]
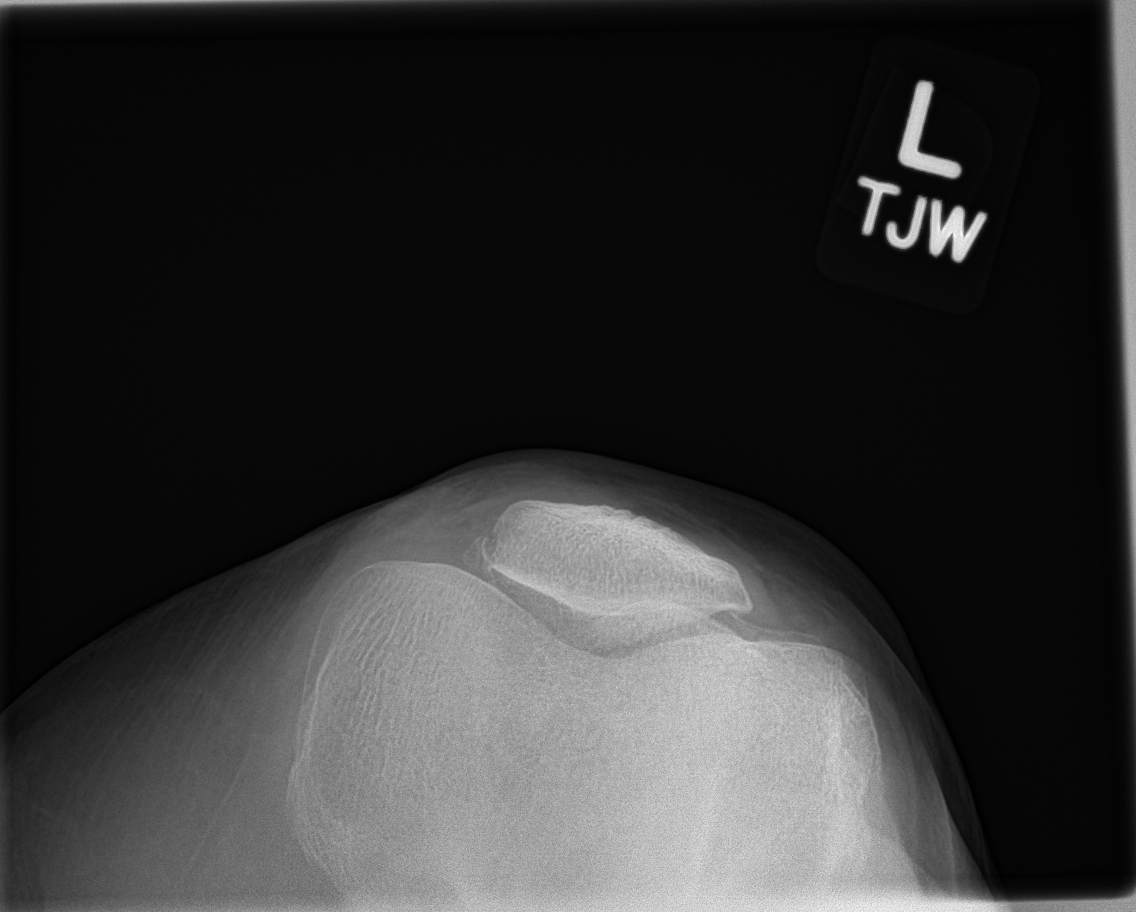

[4 of 4 positions shown; findings below may reference images not displayed]

FINDINGS: Mild cortical irregularity is noted along the medial aspect of the
patella. An undisplaced cortical fracture cannot be totally
excluded. No joint effusion is seen. No other fractures are noted.
IMPRESSION: Mild irregularity of the superomedial aspect of the patella as
described.

## 2019-04-26 IMAGING — DX DG TIBIA/FIBULA 2V*L*
2 series · 2 of 2 positions shown · non-contrast
Comparison: None.

CLINICAL DATA: Atraumatic left leg pain.

EXAM:
LEFT TIBIA AND FIBULA - 2 VIEW

[tibia ap]
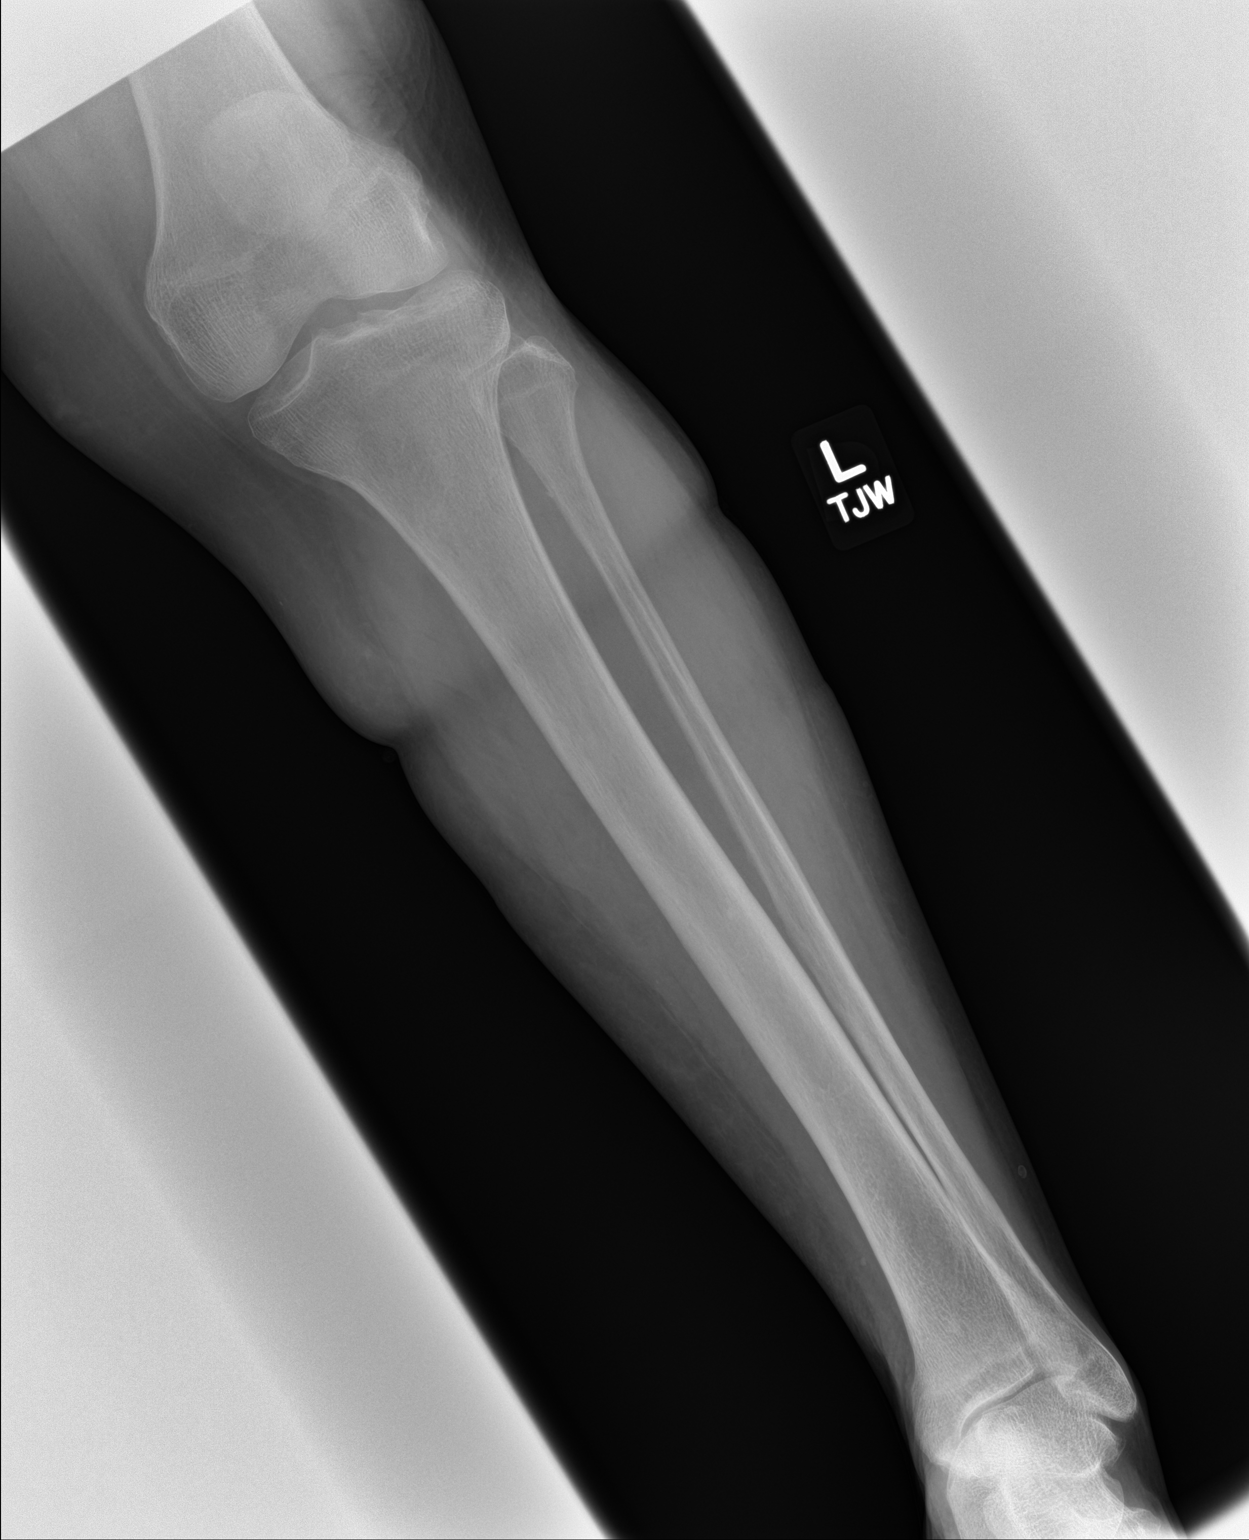

[tibia lat]
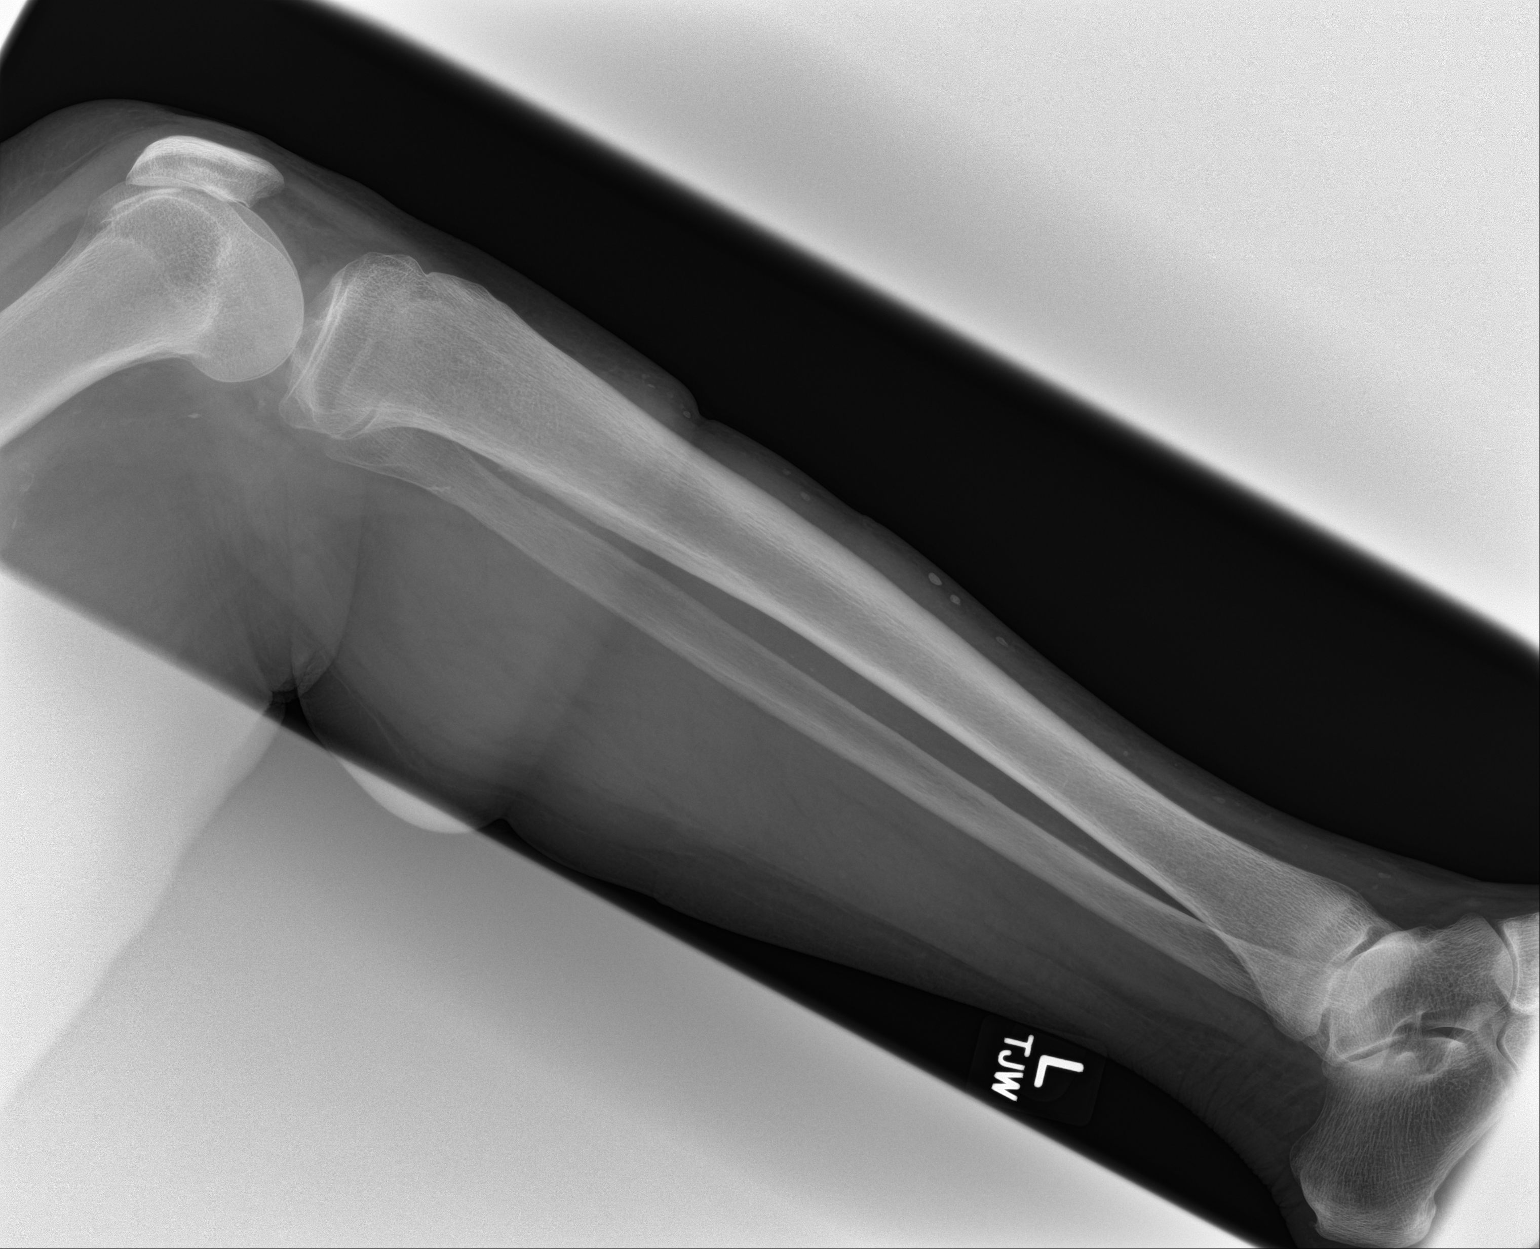

[2 of 2 positions shown; findings below may reference images not displayed]

FINDINGS: There is no evidence of fracture or other focal bone lesions.
Multiple subcentimeter soft tissue calcifications are seen along the
anterior aspect of the left tibia.
IMPRESSION: No acute bony abnormality.

## 2019-04-26 MED ORDER — DEXAMETHASONE SODIUM PHOSPHATE 10 MG/ML IJ SOLN
10.0000 mg | Freq: Once | INTRAMUSCULAR | Status: AC
  Administered 2019-04-26: 10 mg via INTRAMUSCULAR

## 2019-04-26 NOTE — Patient Instructions (Signed)

## 2019-04-26 NOTE — Addendum Note (Signed)
Addended by: Lurlean Nanny on: 04/26/2019 12:15 PM   Modules accepted: Orders

## 2019-04-26 NOTE — Progress Notes (Signed)
Subjective:    Patient ID: Lacey Brown, female    DOB: 1940-02-11, 79 y.o.   MRN: [id number]  HPI  Patient presents to the clinic today with complaint of left knee and left lower leg pain.  She reports this started 4 days ago.  She did describes the pain as sore and achy.  The pain gets worse throughout the day.  She denies numbness, tingling or weakness of the left lower extremity but has noticed some swelling.  She denies any injury to the area.  She denies recent falls.  She has taken Ibuprofen with minimal relief.  Review of Systems      Past Medical History:  Diagnosis Date  . Arthritis    back, neck; right shoulder;   . Asthma    using advair prn  . Chicken pox   . Hyperlipidemia   . Hypertension    controlled with medication;     Current Outpatient Medications  Medication Sig Dispense Refill  . amLODipine (NORVASC) 5 MG tablet Take 1 tablet (5 mg total) by mouth daily. 90 tablet 3  . Cholecalciferol (VITAMIN D3) 2000 UNITS TABS Take 1 tablet by mouth daily.    . Fluticasone-Salmeterol (ADVAIR DISKUS) 250-50 MCG/DOSE AEPB TAKE 1 PUFF BY MOUTH TWICE A DAY **RINSE MOUTH AFTER USE** 180 each 2  . ibuprofen (ADVIL,MOTRIN) 400 MG tablet Take 400 mg by mouth daily as needed.     Marland Kitchen losartan (COZAAR) 100 MG tablet Take 1 tablet (100 mg total) by mouth daily. 90 tablet 0  . metoprolol succinate (TOPROL-XL) 25 MG 24 hr tablet TAKE 1 TABLET BY MOUTH EVERY DAY 90 tablet 0  . Multiple Minerals-Vitamins (CALCIUM CITRATE PLUS PO) Take 1 tablet by mouth daily.    . Omega-3 Fatty Acids (FISH OIL) 1000 MG CAPS Take 1 capsule by mouth daily.    . VENTOLIN HFA 108 (90 Base) MCG/ACT inhaler TAKE 2 PUFFS BY MOUTH EVERY 6 HOURS AS NEEDED FOR WHEEZE OR SHORTNESS OF BREATH 1 Inhaler 2  . vitamin C (ASCORBIC ACID) 500 MG tablet Take 1,000 mg by mouth daily as needed.      No current facility-administered medications for this visit.    Allergies  Allergen Reactions  . Ace Inhibitors  Cough    Other reaction(s): Cough  . Codeine Nausea Only    Family History  Problem Relation Age of Onset  . Heart disease Mother   . Heart disease Father   . Arthritis Maternal Grandmother   . Diabetes Neg Hx   . Cancer Neg Hx   . Stroke Neg Hx     Social History   Socioeconomic History  . Marital status: Widowed    Spouse name: Not on file  . Number of children: Not on file  . Years of education: Not on file  . Highest education level: Not on file  Occupational History  . Not on file  Tobacco Use  . Smoking status: Former Smoker    Quit date: 04/17/2009    Years since quitting: 10.0  . Smokeless tobacco: Never Used  Substance and Sexual Activity  . Alcohol use: Yes    Alcohol/week: 0.0 standard drinks    Comment: occasional  . Drug use: No  . Sexual activity: Never  Other Topics Concern  . Not on file  Social History Narrative  . Not on file   Social Determinants of Health   Financial Resource Strain:   . Difficulty of Paying Living Expenses:  Food Insecurity:   . Worried About Charity fundraiser in the Last Year:   . Arboriculturist in the Last Year:   Transportation Needs:   . Film/video editor (Medical):   Marland Kitchen Lack of Transportation (Non-Medical):   Physical Activity:   . Days of Exercise per Week:   . Minutes of Exercise per Session:   Stress:   . Feeling of Stress :   Social Connections:   . Frequency of Communication with Friends and Family:   . Frequency of Social Gatherings with Friends and Family:   . Attends Religious Services:   . Active Member of Clubs or Organizations:   . Attends Archivist Meetings:   Marland Kitchen Marital Status:   Intimate Partner Violence:   . Fear of Current or Ex-Partner:   . Emotionally Abused:   Marland Kitchen Physically Abused:   . Sexually Abused:      Constitutional: Denies fever, malaise, fatigue, headache or abrupt weight changes.  Respiratory: Denies difficulty breathing, shortness of breath, cough or sputum  production.   Cardiovascular: Denies chest pain, chest tightness, palpitations or swelling in the hands or feet.  Musculoskeletal: Patient reports left knee and left lower leg pain and swelling.  Denies decrease in range of motion, difficulty with gait, muscle pain.  Skin: Denies redness, rashes, lesions or ulcercations.    No other specific complaints in a complete review of systems (except as listed in HPI above).  Objective:   Physical Exam    BP 136/86   Pulse 77   Temp 97.9 F (36.6 C) (Temporal)   Wt 177 lb (80.3 kg)   SpO2 98%   BMI 35.75 kg/m  Wt Readings from Last 3 Encounters:  04/26/19 177 lb (80.3 kg)  03/09/19 177 lb (80.3 kg)  02/02/19 175 lb (79.4 kg)    General: Appears her stated age, obese in NAD. Skin: Warm, dry and intact. No redness or warmth noted. Cardiovascular: Normal rate and rhythm.  Pedal pulse 2+ on the left. Pulmonary/Chest: Normal effort and positive vesicular breath sounds. No respiratory distress. No wheezes, rales or ronchi noted.  AMusculoskeletal: Normal flexion and extension of the left knee.  No pain with palpation of the left knee or left lower leg.  No signs of joint swelling.  Limps with normal gait favoring the left leg. Neurological: Alert and oriented.  Sensation intact to LLE.   BMET    Component Value Date/Time   NA 132 (L) 12/01/2018 1305   K 5.0 12/01/2018 1305   CL 98 12/01/2018 1305   CO2 31 12/01/2018 1305   GLUCOSE 99 12/01/2018 1305   BUN 16 12/01/2018 1305   CREATININE 0.61 12/01/2018 1305   CALCIUM 9.0 12/01/2018 1305    Lipid Panel     Component Value Date/Time   CHOL 196 12/01/2018 1305   TRIG 56.0 12/01/2018 1305   HDL 72.20 12/01/2018 1305   CHOLHDL 3 12/01/2018 1305   VLDL 11.2 12/01/2018 1305   LDLCALC 112 (H) 12/01/2018 1305    CBC    Component Value Date/Time   WBC 7.3 12/01/2018 1305   RBC 4.13 12/01/2018 1305   HGB 12.7 12/01/2018 1305   HCT 38.6 12/01/2018 1305   PLT 220.0 12/01/2018  1305   MCV 93.2 12/01/2018 1305   MCHC 32.9 12/01/2018 1305   RDW 13.4 12/01/2018 1305    Hgb A1C No results found for: HGBA1C        Assessment & Plan:  Acute  Left Knee Pain, Pain in Left Lower Leg:  Exam benign X-ray left knee today X-ray left tib-fib today Decadron 10 mg IM x1 Continue Ibuprofen OTC as needed Consider referral to Ortho pending x-rays  RTC needed if symptoms persist or worsen, we will follow-up after x-rays  Webb Silversmith, NP This visit occurred during the SARS-CoV-2 public health emergency.  Safety protocols were in place, including screening questions prior to the visit, additional usage of staff PPE, and extensive cleaning of exam room while observing appropriate contact time as indicated for disinfecting solutions.

## 2019-04-29 ENCOUNTER — Ambulatory Visit: Payer: Medicare HMO | Admitting: Family Medicine

## 2019-04-29 ENCOUNTER — Encounter: Payer: Self-pay | Admitting: Family Medicine

## 2019-04-29 ENCOUNTER — Other Ambulatory Visit: Payer: Self-pay

## 2019-04-29 ENCOUNTER — Ambulatory Visit (INDEPENDENT_AMBULATORY_CARE_PROVIDER_SITE_OTHER): Payer: Medicare HMO | Admitting: Family Medicine

## 2019-04-29 VITALS — BP 150/70 | HR 75 | Temp 98.3°F | Ht 59.0 in | Wt 179.5 lb

## 2019-04-29 DIAGNOSIS — M1712 Unilateral primary osteoarthritis, left knee: Secondary | ICD-10-CM

## 2019-04-29 DIAGNOSIS — M25562 Pain in left knee: Secondary | ICD-10-CM | POA: Diagnosis not present

## 2019-04-29 MED ORDER — METHYLPREDNISOLONE ACETATE 40 MG/ML IJ SUSP
80.0000 mg | Freq: Once | INTRAMUSCULAR | Status: AC
  Administered 2019-04-29: 80 mg via INTRA_ARTICULAR

## 2019-04-29 NOTE — Progress Notes (Signed)
Ziyad Dyar T. Zanyia Silbaugh, MD Primary Care and [place] at [facility] [place], [address] Phone: [phone]  FAX: [place] - 79 y.o. female  MRN [id number]  Date of Birth: [birth year]  Visit Date: 04/29/2019  PCP: Jearld Fenton, NP  Referred by: Jearld Fenton, NP  Chief Complaint  Patient presents with  . Knee Pain    ?Cortical Fx Left Kneecap    This visit occurred during the SARS-CoV-2 public health emergency.  Safety protocols were in place, including screening questions prior to the visit, additional usage of staff PPE, and extensive cleaning of exam room while observing appropriate contact time as indicated for disinfecting solutions.   Subjective:   Lacey Brown is a 79 y.o. very pleasant female patient with Body mass index is 36.25 kg/m. who presents with the following:  Cc Lacey Brown.  She is a pleasant older lady who has been having some pain in her left knee for a week or 2.  I was asked to see the patient given the symptoms and there was a questionable small cortical irregularity that radiologist felt could not exclude a potential fracture.  Patient does not have any pain at the kneecap.  Her pain is primarily laterally and down in the tibialis anterior musculature.  She is not had any trauma.  She is not really had any significant swelling or mechanical symptoms, or giving way.  She does sometimes take ibuprofen when her knee is bothering her, and she does have some back pain and known arthritis and degenerative changes in her back.  Review of Systems is noted in the HPI, as appropriate   Objective:   BP (!) 150/70   Pulse 75   Temp 98.3 F (36.8 C) (Temporal)   Ht 4\' 11"  (1.499 m)   Wt 179 lb 8 oz (81.4 kg)   SpO2 100%   BMI 36.25 kg/m   GEN: No acute distress; alert,appropriate. PULM: Breathing comfortably in no respiratory distress PSYCH: Normally  interactive.   Left knee: Full extension, flexion to 125.  The entirety of the patella is palpated and there is no tenderness throughout.  No tenderness with loading the medial or lateral patellar facets.  The entirety of the medial and superior aspect of the patella was palpated and the patient has no pain.  Lachman, and PCL are both normal.  MCL and LCL are intact.  No significant patellar or quad tendon pain.  No pain at the tibial plateau.  Radiology: DG Tibia/Fibula Left  Result Date: 04/26/2019 CLINICAL DATA:  Atraumatic left leg pain. EXAM: LEFT TIBIA AND FIBULA - 2 VIEW COMPARISON:  None. FINDINGS: There is no evidence of fracture or other focal bone lesions. Multiple subcentimeter soft tissue calcifications are seen along the anterior aspect of the left tibia. IMPRESSION: No acute bony abnormality. Electronically Signed   By: Virgina Norfolk M.D.   On: 04/26/2019 16:13   DG Knee 4 Views W/Patella Left  Result Date: 04/26/2019 CLINICAL DATA:  Left knee pain, no known injury, initial encounter EXAM: LEFT KNEE - COMPLETE 4+ VIEW COMPARISON:  None. FINDINGS: Mild cortical irregularity is noted along the medial aspect of the patella. An undisplaced cortical fracture cannot be totally excluded. No joint effusion is seen. No other fractures are noted. IMPRESSION: Mild irregularity of the superomedial aspect of the patella as described. Electronically Signed   By: Inez Catalina M.D.   On: 04/26/2019  16:11    Assessment and Plan:     ICD-10-CM   1. Primary osteoarthritis of left knee  M17.12   2. Acute pain of left knee  M25.562 methylPREDNISolone acetate (DEPO-MEDROL) injection 80 mg   The area in question noted by radiology, best apparent in the sunrise view is not consistent with an acute fracture.  She may have had a old avulsion at some time, but this is not painful at all currently.  Her pain is laterally.  Minimal degenerative changes on her plain film, but clinically this most  correlates with lateral osteoarthritis flare.  Continue with ibuprofen and icing as needed.  I am also going to do a diagnostic and therapeutic intra-articular injection of steroids today  Aspiration/Injection Procedure Note Lacey Brown Apr 18, 1940 Date of procedure: 04/29/2019  Procedure: Large Joint Aspiration / Injection of Knee, L Indications: Pain  Procedure Details Patient verbally consented to procedure. Risks (including potential rare risk of infection), benefits, and alternatives explained. Sterilely prepped with Chloraprep. Ethyl cholride used for anesthesia. 8 cc Lidocaine 1% mixed with 2 mL Depo-Medrol 40 mg injected using the anteromedial approach without difficulty. No complications with procedure and tolerated well. Patient had decreased pain post-injection. Medication: 2 mL of Depo-Medrol 40 mg, equaling Depo-Medrol 80 mg total  Follow-up: No follow-ups on file.  Meds ordered this encounter  Medications  . methylPREDNISolone acetate (DEPO-MEDROL) injection 80 mg   There are no discontinued medications. No orders of the defined types were placed in this encounter.   Signed,  Maud Deed. Burnice Vassel, MD   Outpatient Encounter Medications as of 04/29/2019  Medication Sig  . amLODipine (NORVASC) 5 MG tablet Take 1 tablet (5 mg total) by mouth daily.  . Cholecalciferol (VITAMIN D3) 2000 UNITS TABS Take 1 tablet by mouth daily.  . Fluticasone-Salmeterol (ADVAIR DISKUS) 250-50 MCG/DOSE AEPB TAKE 1 PUFF BY MOUTH TWICE A DAY **RINSE MOUTH AFTER USE**  . ibuprofen (ADVIL,MOTRIN) 400 MG tablet Take 400 mg by mouth daily as needed.   Marland Kitchen losartan (COZAAR) 100 MG tablet Take 1 tablet (100 mg total) by mouth daily.  . metoprolol succinate (TOPROL-XL) 25 MG 24 hr tablet TAKE 1 TABLET BY MOUTH EVERY DAY  . Multiple Minerals-Vitamins (CALCIUM CITRATE PLUS PO) Take 1 tablet by mouth daily.  . Omega-3 Fatty Acids (FISH OIL) 1000 MG CAPS Take 1 capsule by mouth daily.  . VENTOLIN  HFA 108 (90 Base) MCG/ACT inhaler TAKE 2 PUFFS BY MOUTH EVERY 6 HOURS AS NEEDED FOR WHEEZE OR SHORTNESS OF BREATH  . vitamin C (ASCORBIC ACID) 500 MG tablet Take 1,000 mg by mouth daily as needed.   . [EXPIRED] methylPREDNISolone acetate (DEPO-MEDROL) injection 80 mg    No facility-administered encounter medications on file as of 04/29/2019.

## 2019-05-04 ENCOUNTER — Other Ambulatory Visit: Payer: Self-pay | Admitting: Internal Medicine

## 2019-05-10 ENCOUNTER — Other Ambulatory Visit: Payer: Self-pay | Admitting: Internal Medicine

## 2019-05-11 ENCOUNTER — Telehealth: Payer: Self-pay | Admitting: Internal Medicine

## 2019-05-11 NOTE — Telephone Encounter (Signed)
Please call her, I cannot reinject her knee for least 3 months.  I am having to see her, possibly put her in a brace, and go over some other things with her if she would like to come back and see me.

## 2019-05-11 NOTE — Telephone Encounter (Signed)
Regina,   Please call and triage.

## 2019-05-11 NOTE — Telephone Encounter (Signed)
Patient called requesting a call back from the nurse She stated she is still having trouble with her left knee cap, patient was in to see Dr Lorelei Pont for this 2 weeks ago. And she stated it is still giving her problems    Advised patient that a follow up may be required for them to further assist But she wanted me to send the message back first to see if she could speak with the nurse.

## 2019-05-11 NOTE — Telephone Encounter (Signed)
Spoke to patient and was advised that the injection helped her left knee about 50%. Patient stated that she is not having the pain that she was having before the injection. Patient stated that she is now having an ache in her left knee and below the knee. Patient stated that her knee just does not feel right. Patient stated that since the injection helped her some she wants to know how soon she can possibly get another one to see if it will help more.

## 2019-05-12 NOTE — Telephone Encounter (Signed)
Lacey Brown notified as instructed by telelphone.  She will think about the follow up appointment/knee brace and call back to schedule if needed.

## 2019-05-18 NOTE — Telephone Encounter (Signed)
Patient called back requesting a call. She stated that before she scheduled an appointment she wanted to speak with you about the xray she had done. She stated she wasn't to know what Dr Lorelei Pont thought was wrong when he received the results

## 2019-05-18 NOTE — Telephone Encounter (Signed)
Tried to call the patient twice with no answer.  Butch Penny, I believe I spoke to her daughter as well.  I think that she is having an arthritis flareup, and she was having some pain outside of the knee itself at the time of my last exam in the muscles that stabilize the knee just below it.  X-rays: Minimal arthritic changes.  There is a slight change in the edge of the kneecap in the highest portion.  When I examined her last, there was no tenderness around this area.  I am very happy to reexamine her.  If things have worsened, I definitely would like for her to come in the office.  If she would still like to speak to me directly, I will call her late in the evening on Wednesday.

## 2019-05-19 NOTE — Telephone Encounter (Signed)
I spoke to the patient this morning for 15 minutes and try to answer all of her questions to the best of my ability.  At this point she is not having any pain in almost the mornings that she wakes up she does have some dull aching sometimes, but she is often completely asymptomatic.  In the office, I am almost certain that I reviewed her plain films with her face-to-face, but by her report she does not think that I did.  She has minimal osteoarthritic changes.  There is a mild discontinuous area in the medial superior aspect aspect of the patella.  Clinically this is not tender in any way.  Throughout.  Her entire patella she has absolutely no tenderness on exam per my last office note.  Clinically, this is not consistent with fracture.  She asked about my certification, and I reassured her that I was board certified in sports medicine.  She had questions regarding the result release and I did my best to answer all of her questions, and explained to her that when she is asymptomatic she should not and would not need any form of intervention at all in any way.  This includes additional corticosteroid injection or other injections.  Tylenol and ibuprofen as needed.  Weight loss.  Activity.

## 2019-06-02 ENCOUNTER — Telehealth: Payer: Self-pay | Admitting: Internal Medicine

## 2019-06-02 NOTE — Telephone Encounter (Signed)
Patient called today requesting a call from the nurse   She stated she is needing to discuss her amLODipine script Patient stated that she is still experiencing some swelling ,  she said she has discussed this with Webb Silversmith before.   C/B # [phone]

## 2019-06-02 NOTE — Telephone Encounter (Signed)
Is the swelling painful or impairing her ability to walk in any way.

## 2019-06-03 ENCOUNTER — Ambulatory Visit (INDEPENDENT_AMBULATORY_CARE_PROVIDER_SITE_OTHER): Payer: Medicare HMO | Admitting: Internal Medicine

## 2019-06-03 ENCOUNTER — Encounter: Payer: Self-pay | Admitting: Internal Medicine

## 2019-06-03 ENCOUNTER — Other Ambulatory Visit: Payer: Self-pay

## 2019-06-03 VITALS — BP 142/94 | HR 65 | Temp 97.6°F | Wt 176.0 lb

## 2019-06-03 DIAGNOSIS — I10 Essential (primary) hypertension: Secondary | ICD-10-CM | POA: Diagnosis not present

## 2019-06-03 DIAGNOSIS — R609 Edema, unspecified: Secondary | ICD-10-CM | POA: Diagnosis not present

## 2019-06-03 NOTE — Patient Instructions (Signed)
Edema  Edema is when you have too much fluid in your body or under your skin. Edema may make your legs, feet, and ankles swell up. Swelling is also common in looser tissues, like around your eyes. This is a common condition. It gets more common as you get older. There are many possible causes of edema. Eating too much salt (sodium) and being on your feet or sitting for a long time can cause edema in your legs, feet, and ankles. Hot weather may make edema worse. Edema is usually painless. Your skin may look swollen or shiny. Follow these instructions at home:  Keep the swollen body part raised (elevated) above the level of your heart when you are sitting or lying down.  Do not sit still or stand for a long time.  Do not wear tight clothes. Do not wear garters on your upper legs.  Exercise your legs. This can help the swelling go down.  Wear elastic bandages or support stockings as told by your doctor.  Eat a low-salt (low-sodium) diet to reduce fluid as told by your doctor.  Depending on the cause of your swelling, you may need to limit how much fluid you drink (fluid restriction).  Take over-the-counter and prescription medicines only as told by your doctor. Contact a doctor if:  Treatment is not working.  You have heart, liver, or kidney disease and have symptoms of edema.  You have sudden and unexplained weight gain. Get help right away if:  You have shortness of breath or chest pain.  You cannot breathe when you lie down.  You have pain, redness, or warmth in the swollen areas.  You have heart, liver, or kidney disease and get edema all of a sudden.  You have a fever and your symptoms get worse all of a sudden. Summary  Edema is when you have too much fluid in your body or under your skin.  Edema may make your legs, feet, and ankles swell up. Swelling is also common in looser tissues, like around your eyes.  Raise (elevate) the swollen body part above the level of your  heart when you are sitting or lying down.  Follow your doctor's instructions about diet and how much fluid you can drink (fluid restriction). This information is not intended to replace advice given to you by your health care provider. Make sure you discuss any questions you have with your health care provider. Document Revised: 01/24/2017 Document Reviewed: 02/09/2016 Elsevier Patient Education  2020 [employer]  

## 2019-06-03 NOTE — Progress Notes (Signed)
Subjective:    Patient ID: Lacey Brown, female    DOB: [birth year], 79 y.o.   MRN: [id number]  HPI  Pt presents to the clinic today with c/o peripheral edema. This has been an ongoing issue, which she attributes to the Amlodipine. The swelling aches but is not painful. She denies redness or pain with ambulation. She would like to stop the Amlodipine. She continues to take Losartan and Metoprolol as prescribed. Her BP today is 142/94.  Review of Systems  Past Medical History:  Diagnosis Date  . Arthritis    back, neck; right shoulder;   . Asthma    using advair prn  . Chicken pox   . Hyperlipidemia   . Hypertension    controlled with medication;     Current Outpatient Medications  Medication Sig Dispense Refill  . amLODipine (NORVASC) 5 MG tablet Take 1 tablet (5 mg total) by mouth daily. 90 tablet 3  . Cholecalciferol (VITAMIN D3) 2000 UNITS TABS Take 1 tablet by mouth daily.    . Fluticasone-Salmeterol (ADVAIR DISKUS) 250-50 MCG/DOSE AEPB TAKE 1 PUFF BY MOUTH TWICE A DAY **RINSE MOUTH AFTER USE** 180 each 2  . ibuprofen (ADVIL,MOTRIN) 400 MG tablet Take 400 mg by mouth daily as needed.     Marland Kitchen losartan (COZAAR) 100 MG tablet TAKE 1 TABLET BY MOUTH EVERY DAY 90 tablet 0  . metoprolol succinate (TOPROL-XL) 25 MG 24 hr tablet TAKE 1 TABLET BY MOUTH EVERY DAY 90 tablet 0  . Multiple Minerals-Vitamins (CALCIUM CITRATE PLUS PO) Take 1 tablet by mouth daily.    . Omega-3 Fatty Acids (FISH OIL) 1000 MG CAPS Take 1 capsule by mouth daily.    . VENTOLIN HFA 108 (90 Base) MCG/ACT inhaler TAKE 2 PUFFS BY MOUTH EVERY 6 HOURS AS NEEDED FOR WHEEZE OR SHORTNESS OF BREATH 1 Inhaler 2  . vitamin C (ASCORBIC ACID) 500 MG tablet Take 1,000 mg by mouth daily as needed.      No current facility-administered medications for this visit.    Allergies  Allergen Reactions  . Ace Inhibitors Cough    Other reaction(s): Cough  . Codeine Nausea Only    Family History  Problem Relation Age of  Onset  . Heart disease Mother   . Heart disease Father   . Arthritis Maternal Grandmother   . Diabetes Neg Hx   . Cancer Neg Hx   . Stroke Neg Hx     Social History   Socioeconomic History  . Marital status: Widowed    Spouse name: Not on file  . Number of children: Not on file  . Years of education: Not on file  . Highest education level: Not on file  Occupational History  . Not on file  Tobacco Use  . Smoking status: Former Smoker    Quit date: 04/17/2009    Years since quitting: 10.1  . Smokeless tobacco: Never Used  Substance and Sexual Activity  . Alcohol use: Yes    Alcohol/week: 0.0 standard drinks    Comment: occasional  . Drug use: No  . Sexual activity: Never  Other Topics Concern  . Not on file  Social History Narrative  . Not on file   Social Determinants of Health   Financial Resource Strain:   . Difficulty of Paying Living Expenses:   Food Insecurity:   . Worried About Charity fundraiser in the Last Year:   . Arboriculturist in the Last Year:   [employer]  Needs:   . Lack of Transportation (Medical):   Marland Kitchen Lack of Transportation (Non-Medical):   Physical Activity:   . Days of Exercise per Week:   . Minutes of Exercise per Session:   Stress:   . Feeling of Stress :   Social Connections:   . Frequency of Communication with Friends and Family:   . Frequency of Social Gatherings with Friends and Family:   . Attends Religious Services:   . Active Member of Clubs or Organizations:   . Attends Archivist Meetings:   Marland Kitchen Marital Status:   Intimate Partner Violence:   . Fear of Current or Ex-Partner:   . Emotionally Abused:   Marland Kitchen Physically Abused:   . Sexually Abused:      Constitutional: Denies fever, malaise, fatigue, headache or abrupt weight changes.  Respiratory: Denies difficulty breathing, shortness of breath, cough or sputum production.   Cardiovascular: Pt reports peripheral edema. Denies chest pain, chest tightness,  palpitations or swelling in the hands.  Neurological: Denies dizziness, difficulty with memory, difficulty with speech or problems with balance and coordination.    No other specific complaints in a complete review of systems (except as listed in HPI above).     Objective:   Physical Exam   BP (!) 142/94   Pulse 65   Temp 97.6 F (36.4 C) (Temporal)   Wt 176 lb (79.8 kg)   SpO2 98%   BMI 35.55 kg/m   Wt Readings from Last 3 Encounters:  04/29/19 179 lb 8 oz (81.4 kg)  04/26/19 177 lb (80.3 kg)  03/09/19 177 lb (80.3 kg)    General: Appears her stated age, obese, in NAD. Cardiovascular: Normal rate and rhythm. S1,S2 noted.  No murmur, rubs or gallops noted. Trace BLE edema.  Pulmonary/Chest: Normal effort and positive vesicular breath sounds. No respiratory distress. No wheezes, rales or ronchi noted.  Neurological: Alert and oriented.    BMET    Component Value Date/Time   NA 132 (L) 12/01/2018 1305   K 5.0 12/01/2018 1305   CL 98 12/01/2018 1305   CO2 31 12/01/2018 1305   GLUCOSE 99 12/01/2018 1305   BUN 16 12/01/2018 1305   CREATININE 0.61 12/01/2018 1305   CALCIUM 9.0 12/01/2018 1305    Lipid Panel     Component Value Date/Time   CHOL 196 12/01/2018 1305   TRIG 56.0 12/01/2018 1305   HDL 72.20 12/01/2018 1305   CHOLHDL 3 12/01/2018 1305   VLDL 11.2 12/01/2018 1305   LDLCALC 112 (H) 12/01/2018 1305    CBC    Component Value Date/Time   WBC 7.3 12/01/2018 1305   RBC 4.13 12/01/2018 1305   HGB 12.7 12/01/2018 1305   HCT 38.6 12/01/2018 1305   PLT 220.0 12/01/2018 1305   MCV 93.2 12/01/2018 1305   MCHC 32.9 12/01/2018 1305   RDW 13.4 12/01/2018 1305    Hgb A1C No results found for: HGBA1C         Assessment & Plan:   Peripheral Edema, HTN:  Will stop Amlodipine Continue Losartan and Metoprolol Monitor salt intake, she has slightly low sodium. Encouraged leg elevation while sitting Limit Ibuprofen use  RTC in 2 weeks for  reevaluation of edema, HTN  This visit occurred during the SARS-CoV-2 public health emergency.  Safety protocols were in place, including screening questions prior to the visit, additional usage of staff PPE, and extensive cleaning of exam room while observing appropriate contact time as indicated for disinfecting solutions.  Webb Silversmith, NP

## 2019-06-03 NOTE — Telephone Encounter (Signed)
Pt has appt to be seen

## 2019-06-08 ENCOUNTER — Telehealth: Payer: Self-pay

## 2019-06-08 DIAGNOSIS — M810 Age-related osteoporosis without current pathological fracture: Secondary | ICD-10-CM

## 2019-06-08 NOTE — Telephone Encounter (Signed)
Pt's next Prolia injection due 07-23-19.  Pt has [employer] MCR.  Prior auth done.  [id number], valid 06-03-19 to 06-02-20.  Pt would owe approx. $260 after insurance.  $240 for Prolia and $20 for admin fee.

## 2019-06-14 ENCOUNTER — Telehealth: Payer: Self-pay

## 2019-06-14 NOTE — Telephone Encounter (Signed)
Will discuss at upcoming appt.

## 2019-06-14 NOTE — Telephone Encounter (Addendum)
Pt called back after speaking with her sister; pt said that she did not have CP but did have dull abd pain that went thru to her back on 06/11/19 for 45 - 60'. The pain just went away and no pain since then. Pt has not had that type abd pain before.On 06/13/19 had constipation but after using suppository pt had good results with BM. Pt had small amt of blood due to straining.  Pt would like to see R Baity NP to go over what happened with abd pain on 06/11/19. Pt said she does not have a way to ck her temp but she does not feel warm to tough. Pt has no covid symptoms, no travel and no known exposure to + covid. Pt wanted to know what to do about constipation; advised to increase fiber such as raisin bran, fresh fruits and vegetables and increase water intake. Pt voiced understanding and scheduled in office 30' appt with Avie Echevaria NP on 06/15/19 at 3:15. UC & ED precautions given and pt voiced understanding.

## 2019-06-14 NOTE — Telephone Encounter (Signed)
[facility] [place] Night - Client TELEPHONE ADVICE RECORD [facility] Patient Name: Lacey Brown Gender: Female DOB: 07-Jan-1941 Age: 79 Y 2 M 22 D Return Phone Number: [phone] (Primary) [place] [address] Client [facility] Night - Client Client Site [facility] Physician Webb Silversmith - NP Contact Type Call Who Is Calling Patient / Member / Family / Caregiver Call Type Call Back Waiting for Nurse Return Phone Number [phone] (Primary) Chief Complaint Constipation Reason for Call Call Back Waiting for Nurse Initial Comment Caller states she missed the nurses call. Translation No Nurse Assessment Nurse: Alvis Lemmings, RN, Marcie Bal Date/Time Eilene Ghazi Time): 06/13/2019 9:32:09 AM Confirm and document reason for call. If symptomatic, describe symptoms. ---Caller states she is constipated. Its at the anus and won't come out. Last BM 2 days. Has the patient had close contact with a person known or suspected to have the novel coronavirus illness OR traveled / lives in area with major community spread (including international travel) in the last 14 days from the onset of symptoms? * If Asymptomatic, screen for exposure and travel within the last 14 days. ---No Does the patient have any new or worsening symptoms? ---Yes Will a triage be completed? ---Yes Related visit to physician within the last 2 weeks? ---No Does the PT have any chronic conditions? (i.e. diabetes, asthma, this includes High risk factors for pregnancy, etc.) ---No Is this a behavioral health or substance abuse call? ---No Guidelines Guideline Title Affirmed Question Affirmed Notes Nurse Date/Time (Eastern Time) Constipation [1] Rectal pain or fullness from fecal impaction (rectum full of stool) AND [2] has not tried SITZ bath, suppository or enema Etter Sjogren 06/13/2019 9:36:08 AM Disp. Time Eilene Ghazi  Time) Disposition Final User 06/13/2019 9:25:03 AM Send To Call Back Waiting For Nurse Rolan Bucco 06/13/2019 9:38:04 [place], RN, Marcie Bal PLEASE NOTE: All timestamps contained within this report are represented as [place] Standard Time. CONFIDENTIALTY NOTICE: This fax transmission is intended only for the addressee. It contains information that is legally privileged, confidential or otherwise protected from use or disclosure. If you are not the intended recipient, you are strictly prohibited from reviewing, disclosing, copying using or disseminating any of this information or taking any action in reliance on or regarding this information. If you have received this fax in error, please notify us immediately by telephone so that we can arrange for its return to [place]. Phone: [phone], Toll-Free: [phone], Fax: [phone] Page: 2 of 2 Call Id: [id number] [place] Disagree/Comply Comply Caller Understands Yes PreDisposition Call a family member Care Advice Given Per Guideline HOME CARE: * You should be able to treat this at home. GENERAL CONSTIPATION INSTRUCTIONS: * Eat a high fiber diet. * Drink adequate liquids. * Get into a rhythm - try to have a BM at the same time each day. WARM SALINE SITZ BATH - TWICE DAILY FOR RECTAL PAIN DUE TO CONSTIPATION: * Sit in a warm sitz bath for 20 minutes twice a day. * A SITZ bath may help relax the muscles around your rectum and make it easier to have a bowel movement. SUPPOSITORY FOR ACUTE RECTAL PAIN: * IF the sitz bath doesn't work, use 1 or 2 glycerin suppositories (OTC). EXPECTED COURSE: * Rectal pain should be completely relieved by these instructions. If the discomfort goes away, call your PCP during regular office hours. * If the discomfort does not go away, you should be seen right away. CARE ADVICE  given per Constipation (Adult) guideline. CALL BACK IF: * Rectal pain is not relieved * You become worse. * Constant or increasing  abdominal pain Comments User: Manning Charity, RN Date/Time Eilene Ghazi Time): 06/13/2019 9:39:47 AM advised warm bath, lubricant or vaseline at the anus, glycerin suppository and try to wait 5 min. to pass the stool as it will lubricate the area. Increase fiber and fluids, warm prune juice in a.m. UV. Call if anything worsens or isnt relieved. neighbor is getting suppositories

## 2019-06-14 NOTE — Telephone Encounter (Signed)
I called Lacey Brown and Lacey Brown wants to cancel 06/17/19 2 wk FU appt and keep 06/15/19 at 3:15 with Avie Echevaria NP. Lacey Brown said she was not having any abd pain but since talking with me she has had 2 small loose BMs. Lacey Brown wanted to know if she should take metamucil or miralax tonight. I advised Lacey Brown if she was having loose but not diarrhea stools not to take Metamucil or miralax until discuss with Avie Echevaria  NP on 06/15/19. Lacey Brown voiced understanding. FYI to Avie Echevaria NP.

## 2019-06-14 NOTE — Telephone Encounter (Signed)
Noted, agree with advice given 

## 2019-06-14 NOTE — Telephone Encounter (Signed)
Unable to reach pt by phone x 2. I spoke with pts sister and pt was relieved from constipation by using suppositories over the weekend.Iris did say pt did have CP and abd pain 2-3 days ago but none since then; Iris will contact pt and if has any CP or abd pain will call [email or web address].FYI to Avie Echevaria NP.

## 2019-06-14 NOTE — Telephone Encounter (Signed)
[facility] Night - Client TELEPHONE ADVICE RECORD [facility] Patient Name: SHADIYA HALLINAN Gender: Female DOB: 13-May-1940 Age: 79 Y 2 M 22 D Return Phone Number: [phone] (Primary) Address[place] [address] Client [facility] Night - Client Client Site [facility] Physician Webb Silversmith - NP Contact Type Call Who Is Calling Patient / Member / Family / Caregiver Call Type Triage / Clinical Relationship To Patient Self Return Phone Number [phone] (Primary) Chief Complaint Constipation Reason for Call Symptomatic / Request for Health Information Initial Comment Caller states she is constpiated. Translation No Nurse Assessment Guidelines Guideline Title Affirmed Question Affirmed Notes Nurse Date/Time (Eastern Time) Disp. Time Eilene Ghazi Time) Disposition Final User 06/13/2019 9:17:57 AM Attempt made - message left Gregery Na, [place] 06/13/2019 9:18:24 AM Clinical Call Yes Edmonia Lynch, RN, Dena Comments User: Willis Modena, RN Date/Time Eilene Ghazi Time): 06/13/2019 9:16:35 AM Attempted call X 2; first call answered and immediately hung up; second call nurse identified self and person states "let me call you back" and disconnects phone. User: Willis Modena, RN Date/Time Eilene Ghazi Time): 06/13/2019 9:19:28 AM Caller unable to talk; after nurse identifies self and client she states "let me call you back" and ends call.

## 2019-06-15 ENCOUNTER — Ambulatory Visit (INDEPENDENT_AMBULATORY_CARE_PROVIDER_SITE_OTHER)
Admission: RE | Admit: 2019-06-15 | Discharge: 2019-06-15 | Disposition: A | Discharge status: Home / Self Care | Payer: Medicare HMO | Source: Ambulatory Visit | Attending: Internal Medicine | Admitting: Internal Medicine

## 2019-06-15 ENCOUNTER — Other Ambulatory Visit: Payer: Self-pay

## 2019-06-15 ENCOUNTER — Ambulatory Visit (INDEPENDENT_AMBULATORY_CARE_PROVIDER_SITE_OTHER): Payer: Medicare HMO | Admitting: Internal Medicine

## 2019-06-15 ENCOUNTER — Encounter: Payer: Self-pay | Admitting: Internal Medicine

## 2019-06-15 VITALS — BP 168/88 | HR 70 | Temp 98.6°F | Ht 59.0 in | Wt 175.4 lb

## 2019-06-15 DIAGNOSIS — R1084 Generalized abdominal pain: Secondary | ICD-10-CM | POA: Diagnosis not present

## 2019-06-15 DIAGNOSIS — R195 Other fecal abnormalities: Secondary | ICD-10-CM

## 2019-06-15 DIAGNOSIS — I10 Essential (primary) hypertension: Secondary | ICD-10-CM | POA: Diagnosis not present

## 2019-06-15 DIAGNOSIS — K5900 Constipation, unspecified: Secondary | ICD-10-CM | POA: Diagnosis not present

## 2019-06-15 IMAGING — DX DG ABDOMEN 1V
2 series · 2 of 2 positions shown · non-contrast
Comparison: None.

CLINICAL DATA: Constipation

EXAM:
ABDOMEN - 1 VIEW

[abdomen kub (1 of 2)]
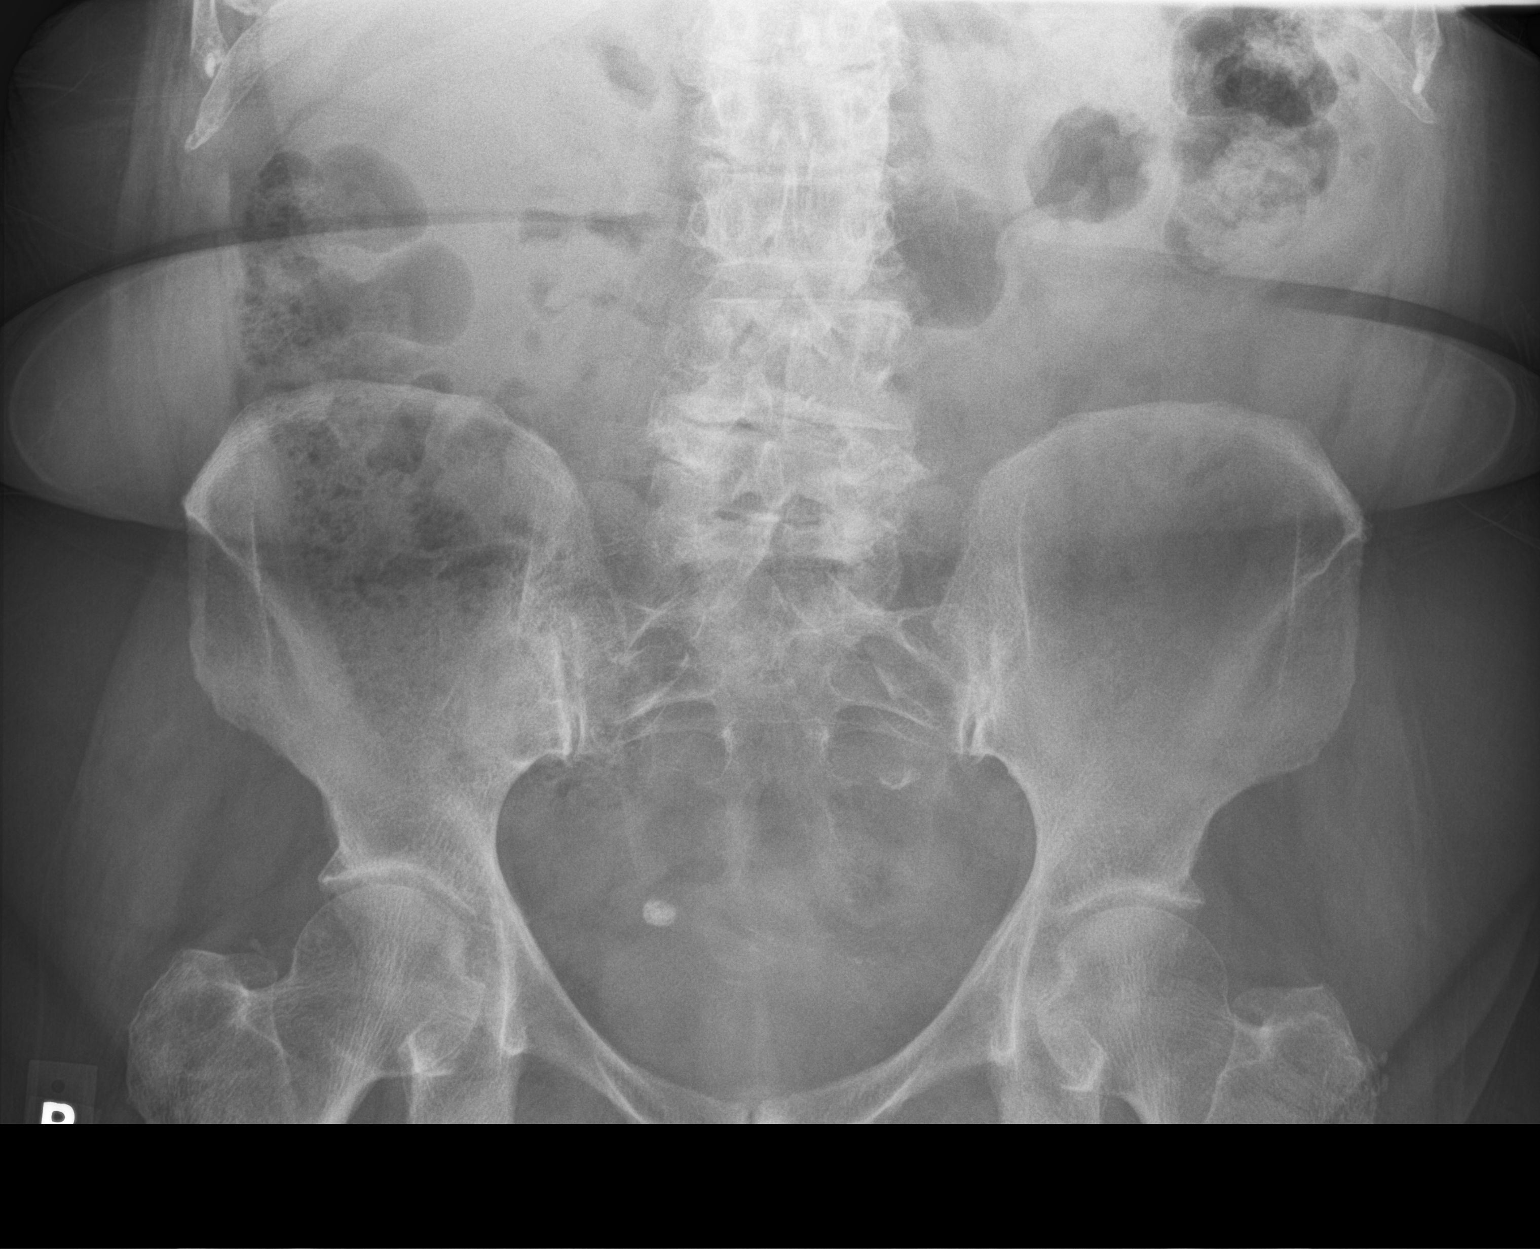

[abdomen kub (2 of 2)]
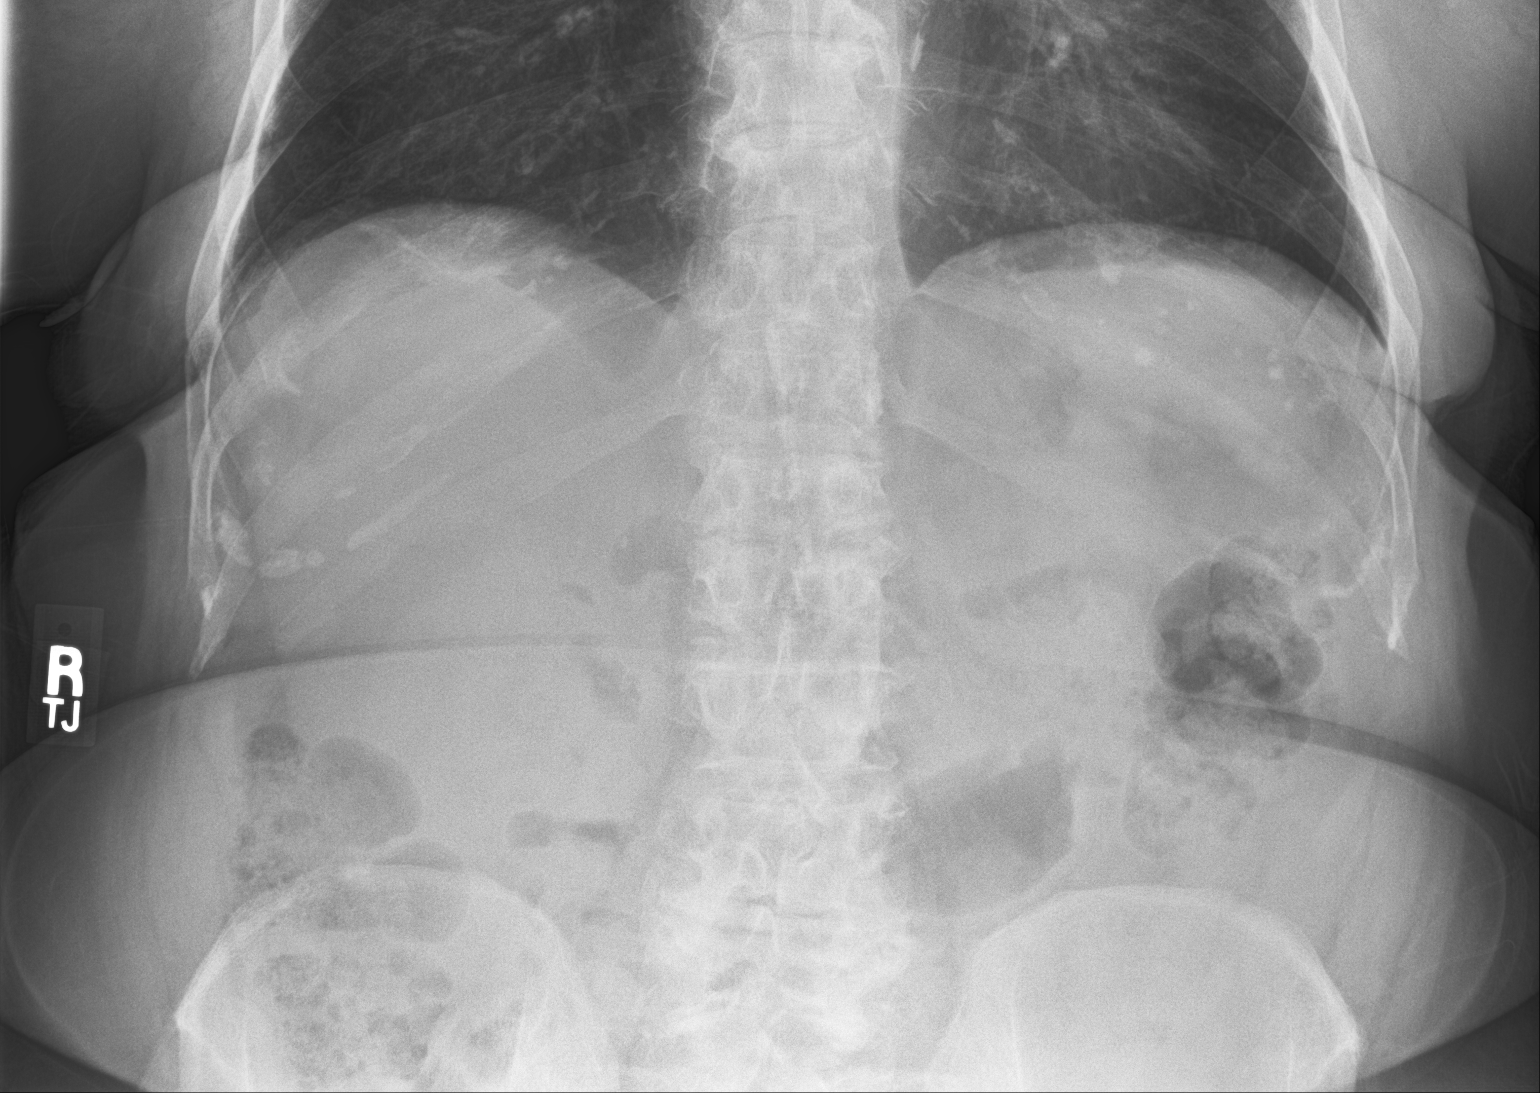

[2 of 2 positions shown; findings below may reference images not displayed]

FINDINGS: There is moderate stool throughout colon. There is no bowel
dilatation or air-fluid level to suggest bowel obstruction. No free
air. There are presumed phleboliths in the pelvis. There is
degenerative change in the lumbar spine. Lung bases are clear.
IMPRESSION: Moderate stool in colon.  No evident bowel obstruction or free air.

## 2019-06-15 MED ORDER — DOCUSATE SODIUM 100 MG PO CAPS: 100.0000 mg | ORAL_CAPSULE | Freq: Every day | ORAL | 0 refills | Status: DC

## 2019-06-15 NOTE — Patient Instructions (Signed)

## 2019-06-15 NOTE — Assessment & Plan Note (Signed)
Uncontrolled She thinks it is high because of the abdominal issue she has been having Will recheck in 1 week Continue Losartan and Metoprolol If not under 150/90 consistently, will need to add additional medication- not Amlodipine due to swelling

## 2019-06-15 NOTE — Progress Notes (Signed)
Subjective:    Patient ID: Lacey Brown, female    DOB: Jun 11, 1940, 79 y.o.   MRN: [id number]  HPI  Pt presents to the clinic today for 2 week follow up of HTN. She wanted to hold her Amlodipine secondary to swelling in her legs. She is taking Losartan and Metoprolol as prescribed. Her BP today is 166/88. She has a slight headache but denies dizziness. The swelling in the ankles has resolved.  She also reports episode of abdominal pain, nausea and vomiting. This occurred 5 days ago. She described the pain as cramping. She tried to have a BM but was unable to have a BM. She subsequently had an episode of nausea and vomiting. She has noticed some stool changes recently, stools are hard but she denies constipation or diarrhea. She reports a Dulcolax suppository. She had a successful bowel movement after that. She had some loose stools after that but has not had a BM today. The pain is now resolved.  Review of Systems      Past Medical History:  Diagnosis Date  . Arthritis    back, neck; right shoulder;   . Asthma    using advair prn  . Chicken pox   . Hyperlipidemia   . Hypertension    controlled with medication;     Current Outpatient Medications  Medication Sig Dispense Refill  . amLODipine (NORVASC) 5 MG tablet Take 1 tablet (5 mg total) by mouth daily. 90 tablet 3  . Cholecalciferol (VITAMIN D3) 2000 UNITS TABS Take 1 tablet by mouth daily.    . Fluticasone-Salmeterol (ADVAIR DISKUS) 250-50 MCG/DOSE AEPB TAKE 1 PUFF BY MOUTH TWICE A DAY **RINSE MOUTH AFTER USE** 180 each 2  . ibuprofen (ADVIL,MOTRIN) 400 MG tablet Take 400 mg by mouth daily as needed.     Marland Kitchen losartan (COZAAR) 100 MG tablet TAKE 1 TABLET BY MOUTH EVERY DAY 90 tablet 0  . metoprolol succinate (TOPROL-XL) 25 MG 24 hr tablet TAKE 1 TABLET BY MOUTH EVERY DAY 90 tablet 0  . Multiple Minerals-Vitamins (CALCIUM CITRATE PLUS PO) Take 1 tablet by mouth daily.    . Omega-3 Fatty Acids (FISH OIL) 1000 MG CAPS Take 1  capsule by mouth daily.    . VENTOLIN HFA 108 (90 Base) MCG/ACT inhaler TAKE 2 PUFFS BY MOUTH EVERY 6 HOURS AS NEEDED FOR WHEEZE OR SHORTNESS OF BREATH 1 Inhaler 2  . vitamin C (ASCORBIC ACID) 500 MG tablet Take 1,000 mg by mouth daily as needed.      No current facility-administered medications for this visit.    Allergies  Allergen Reactions  . Ace Inhibitors Cough    Other reaction(s): Cough  . Codeine Nausea Only    Family History  Problem Relation Age of Onset  . Heart disease Mother   . Heart disease Father   . Arthritis Maternal Grandmother   . Diabetes Neg Hx   . Cancer Neg Hx   . Stroke Neg Hx     Social History   Socioeconomic History  . Marital status: Widowed    Spouse name: Not on file  . Number of children: Not on file  . Years of education: Not on file  . Highest education level: Not on file  Occupational History  . Not on file  Tobacco Use  . Smoking status: Former Smoker    Quit date: 04/17/2009    Years since quitting: 10.1  . Smokeless tobacco: Never Used  Substance and Sexual Activity  . Alcohol  use: Yes    Alcohol/week: 0.0 standard drinks    Comment: occasional  . Drug use: No  . Sexual activity: Never  Other Topics Concern  . Not on file  Social History Narrative  . Not on file   Social Determinants of Health   Financial Resource Strain:   . Difficulty of Paying Living Expenses:   Food Insecurity:   . Worried About Charity fundraiser in the Last Year:   . Arboriculturist in the Last Year:   Transportation Needs:   . Film/video editor (Medical):   Marland Kitchen Lack of Transportation (Non-Medical):   Physical Activity:   . Days of Exercise per Week:   . Minutes of Exercise per Session:   Stress:   . Feeling of Stress :   Social Connections:   . Frequency of Communication with Friends and Family:   . Frequency of Social Gatherings with Friends and Family:   . Attends Religious Services:   . Active Member of Clubs or Organizations:     . Attends Archivist Meetings:   Marland Kitchen Marital Status:   Intimate Partner Violence:   . Fear of Current or Ex-Partner:   . Emotionally Abused:   Marland Kitchen Physically Abused:   . Sexually Abused:      Constitutional: Denies fever, malaise, fatigue, headache or abrupt weight changes.  Respiratory: Denies difficulty breathing, shortness of breath, cough or sputum production.   Cardiovascular: Denies chest pain, chest tightness, palpitations or swelling in the hands or feet.  Gastrointestinal: Pt reports abdominal pain, constipation. Denies bloating, diarrhea or blood in the stool.  Neurological: Denies dizziness, difficulty with memory, difficulty with speech or problems with balance and coordination.    No other specific complaints in a complete review of systems (except as listed in HPI above).  Objective:   Physical Exam  BP (!) 168/88 (BP Location: Left Arm, Patient Position: Sitting, Cuff Size: Normal)   Pulse 70   Temp 98.6 F (37 C) (Temporal)   Ht 4\' 11"  (1.499 m)   Wt 175 lb 6.4 oz (79.6 kg)   SpO2 97%   BMI 35.43 kg/m   Wt Readings from Last 3 Encounters:  06/03/19 176 lb (79.8 kg)  04/29/19 179 lb 8 oz (81.4 kg)  04/26/19 177 lb (80.3 kg)    General: Appears her stated age, well developed, well nourished in NAD. Cardiovascular: Normal rate and rhythm. . No JVD or BLE edema.  Pulmonary/Chest: Normal effort and positive vesicular breath sounds. No respiratory distress. No wheezes, rales or ronchi noted.  Abdomen: Soft and nontender. Normal bowel sounds. No distention or masses noted.  Rectal: Decreased rectal tone. No stool noted in the rectal vault. External (nonbleeding or thrombosed) hemorrhoids noted. Hemoccult negative stool. Neurological: Alert and oriented.   BMET    Component Value Date/Time   NA 132 (L) 12/01/2018 1305   K 5.0 12/01/2018 1305   CL 98 12/01/2018 1305   CO2 31 12/01/2018 1305   GLUCOSE 99 12/01/2018 1305   BUN 16 12/01/2018 1305    CREATININE 0.61 12/01/2018 1305   CALCIUM 9.0 12/01/2018 1305    Lipid Panel     Component Value Date/Time   CHOL 196 12/01/2018 1305   TRIG 56.0 12/01/2018 1305   HDL 72.20 12/01/2018 1305   CHOLHDL 3 12/01/2018 1305   VLDL 11.2 12/01/2018 1305   LDLCALC 112 (H) 12/01/2018 1305    CBC    Component Value Date/Time   WBC  7.3 12/01/2018 1305   RBC 4.13 12/01/2018 1305   HGB 12.7 12/01/2018 1305   HCT 38.6 12/01/2018 1305   PLT 220.0 12/01/2018 1305   MCV 93.2 12/01/2018 1305   MCHC 32.9 12/01/2018 1305   RDW 13.4 12/01/2018 1305    Hgb A1C No results found for: HGBA1C         Assessment & Plan:   Abdominal Pain, Hard Stool:  KUB today Continue water and fiber intake Colace 100 mg daily prn  Will follow up after xray, return precautions discussed  Webb Silversmith, NP This visit occurred during the SARS-CoV-2 public health emergency.  Safety protocols were in place, including screening questions prior to the visit, additional usage of staff PPE, and extensive cleaning of exam room while observing appropriate contact time as indicated for disinfecting solutions.

## 2019-06-15 NOTE — Telephone Encounter (Signed)
Placed remind me to contact pt for labs.

## 2019-06-17 ENCOUNTER — Ambulatory Visit: Payer: Medicare HMO | Admitting: Internal Medicine

## 2019-06-22 ENCOUNTER — Other Ambulatory Visit: Payer: Self-pay

## 2019-06-22 ENCOUNTER — Ambulatory Visit (INDEPENDENT_AMBULATORY_CARE_PROVIDER_SITE_OTHER): Payer: Medicare HMO | Admitting: Internal Medicine

## 2019-06-22 ENCOUNTER — Encounter: Payer: Self-pay | Admitting: Internal Medicine

## 2019-06-22 DIAGNOSIS — I10 Essential (primary) hypertension: Secondary | ICD-10-CM

## 2019-06-22 NOTE — Patient Instructions (Signed)

## 2019-06-22 NOTE — Assessment & Plan Note (Signed)
Reasonable given her age Continue Losartan and Metoprolol Will monitor

## 2019-06-22 NOTE — Progress Notes (Signed)
Subjective:    Patient ID: Lacey Brown, female    DOB: 06-21-1940, 79 y.o.   MRN: [id number]  HPI  Patient presents to the clinic today for 1 week follow-up of hypertension.  At her last visit felt like her blood pressure was elevated due to some abdominal pain that she had been having.  That abdominal pain has resolved.  Her BP today is 150/84.  She is taking Losartan and Metoprolol as prescribed.  ECG from 03/2018 reviewed.  Review of Systems  Past Medical History:  Diagnosis Date  . Arthritis    back, neck; right shoulder;   . Asthma    using advair prn  . Chicken pox   . Hyperlipidemia   . Hypertension    controlled with medication;     Current Outpatient Medications  Medication Sig Dispense Refill  . amLODipine (NORVASC) 5 MG tablet Take 1 tablet (5 mg total) by mouth daily. 90 tablet 3  . Cholecalciferol (VITAMIN D3) 2000 UNITS TABS Take 1 tablet by mouth daily.    Marland Kitchen docusate sodium (COLACE) 100 MG capsule Take 1 capsule (100 mg total) by mouth daily. 30 capsule 0  . Fluticasone-Salmeterol (ADVAIR DISKUS) 250-50 MCG/DOSE AEPB TAKE 1 PUFF BY MOUTH TWICE A DAY **RINSE MOUTH AFTER USE** 180 each 2  . ibuprofen (ADVIL,MOTRIN) 400 MG tablet Take 400 mg by mouth daily as needed.     Marland Kitchen losartan (COZAAR) 100 MG tablet TAKE 1 TABLET BY MOUTH EVERY DAY 90 tablet 0  . metoprolol succinate (TOPROL-XL) 25 MG 24 hr tablet TAKE 1 TABLET BY MOUTH EVERY DAY 90 tablet 0  . Multiple Minerals-Vitamins (CALCIUM CITRATE PLUS PO) Take 1 tablet by mouth daily.    . Omega-3 Fatty Acids (FISH OIL) 1000 MG CAPS Take 1 capsule by mouth daily.    . VENTOLIN HFA 108 (90 Base) MCG/ACT inhaler TAKE 2 PUFFS BY MOUTH EVERY 6 HOURS AS NEEDED FOR WHEEZE OR SHORTNESS OF BREATH 1 Inhaler 2  . vitamin C (ASCORBIC ACID) 500 MG tablet Take 1,000 mg by mouth daily as needed.      No current facility-administered medications for this visit.    Allergies  Allergen Reactions  . Ace Inhibitors Cough   Other reaction(s): Cough  . Codeine Nausea Only    Family History  Problem Relation Age of Onset  . Heart disease Mother   . Heart disease Father   . Arthritis Maternal Grandmother   . Diabetes Neg Hx   . Cancer Neg Hx   . Stroke Neg Hx     Social History   Socioeconomic History  . Marital status: Widowed    Spouse name: Not on file  . Number of children: Not on file  . Years of education: Not on file  . Highest education level: Not on file  Occupational History  . Not on file  Tobacco Use  . Smoking status: Former Smoker    Quit date: 04/17/2009    Years since quitting: 10.1  . Smokeless tobacco: Never Used  Substance and Sexual Activity  . Alcohol use: Yes    Alcohol/week: 0.0 standard drinks    Comment: occasional  . Drug use: No  . Sexual activity: Never  Other Topics Concern  . Not on file  Social History Narrative  . Not on file   Social Determinants of Health   Financial Resource Strain:   . Difficulty of Paying Living Expenses:   Food Insecurity:   . Worried About Running  Out of Food in the Last Year:   . [place] in the Last Year:   Transportation Needs:   . Lack of Transportation (Medical):   Marland Kitchen Lack of Transportation (Non-Medical):   Physical Activity:   . Days of Exercise per Week:   . Minutes of Exercise per Session:   Stress:   . Feeling of Stress :   Social Connections:   . Frequency of Communication with Friends and Family:   . Frequency of Social Gatherings with Friends and Family:   . Attends Religious Services:   . Active Member of Clubs or Organizations:   . Attends Archivist Meetings:   Marland Kitchen Marital Status:   Intimate Partner Violence:   . Fear of Current or Ex-Partner:   . Emotionally Abused:   Marland Kitchen Physically Abused:   . Sexually Abused:      Constitutional: Denies fever, malaise, fatigue, headache or abrupt weight changes.  Respiratory: Denies difficulty breathing, shortness of breath, cough or sputum production.    Cardiovascular: Denies chest pain, chest tightness, palpitations or swelling in the hands or feet.  Neurological: Denies dizziness, difficulty with memory, difficulty with speech or problems with balance and coordination.    No other specific complaints in a complete review of systems (except as listed in HPI above).     Objective:   Physical Exam  BP (!) 150/84   Pulse 72   Temp (!) 96.2 F (35.7 C) (Temporal)   Wt 173 lb (78.5 kg)   SpO2 97%   BMI 34.94 kg/m   Wt Readings from Last 3 Encounters:  06/15/19 175 lb 6.4 oz (79.6 kg)  06/03/19 176 lb (79.8 kg)  04/29/19 179 lb 8 oz (81.4 kg)    General: Appears her stated age, obese, in NAD. Cardiovascular: Normal rate and rhythm. S1,S2 noted.  No murmur, rubs or gallops noted. No JVD or BLE edema.  Pulmonary/Chest: Normal effort and positive vesicular breath sounds. No respiratory distress. No wheezes, rales or ronchi noted.  Neurological: Alert and oriented.   BMET    Component Value Date/Time   NA 132 (L) 12/01/2018 1305   K 5.0 12/01/2018 1305   CL 98 12/01/2018 1305   CO2 31 12/01/2018 1305   GLUCOSE 99 12/01/2018 1305   BUN 16 12/01/2018 1305   CREATININE 0.61 12/01/2018 1305   CALCIUM 9.0 12/01/2018 1305    Lipid Panel     Component Value Date/Time   CHOL 196 12/01/2018 1305   TRIG 56.0 12/01/2018 1305   HDL 72.20 12/01/2018 1305   CHOLHDL 3 12/01/2018 1305   VLDL 11.2 12/01/2018 1305   LDLCALC 112 (H) 12/01/2018 1305    CBC    Component Value Date/Time   WBC 7.3 12/01/2018 1305   RBC 4.13 12/01/2018 1305   HGB 12.7 12/01/2018 1305   HCT 38.6 12/01/2018 1305   PLT 220.0 12/01/2018 1305   MCV 93.2 12/01/2018 1305   MCHC 32.9 12/01/2018 1305   RDW 13.4 12/01/2018 1305    Hgb A1C No results found for: HGBA1C          Assessment & Plan:   Webb Silversmith, NP This visit occurred during the SARS-CoV-2 public health emergency.  Safety protocols were in place, including screening questions  prior to the visit, additional usage of staff PPE, and extensive cleaning of exam room while observing appropriate contact time as indicated for disinfecting solutions.

## 2019-07-08 NOTE — Telephone Encounter (Signed)
A user error has taken place: encounter opened in error, closed for administrative reasons.

## 2019-07-08 NOTE — Addendum Note (Signed)
Addended by: Randall An on: 07/08/2019 05:10 PM   Modules accepted: Orders

## 2019-07-08 NOTE — Telephone Encounter (Signed)
Pt scheduled for lab on 6/8 and inj on 6/22.  Placed order for BMP.

## 2019-07-09 ENCOUNTER — Telehealth: Payer: Self-pay

## 2019-07-09 NOTE — Telephone Encounter (Signed)
Patient contacted the office asking to speak with Rollene Fare, NP or Threasa Beards. I asked patient if there was anything I could assist her with, and she states that she would really just like to speak with either Threasa Beards or Rollene Fare, NP.  Will route to them. Thank you!

## 2019-07-12 NOTE — Telephone Encounter (Signed)
Patient called today requesting a call back about prolia She stated it had to do with the insurance/. Patient did not go into great detail only that She would just like a call back from you   C/b# [phone]

## 2019-07-13 ENCOUNTER — Other Ambulatory Visit (INDEPENDENT_AMBULATORY_CARE_PROVIDER_SITE_OTHER): Payer: Medicare HMO

## 2019-07-13 ENCOUNTER — Other Ambulatory Visit: Payer: Self-pay

## 2019-07-13 DIAGNOSIS — M810 Age-related osteoporosis without current pathological fracture: Secondary | ICD-10-CM | POA: Diagnosis not present

## 2019-07-13 LAB — BASIC METABOLIC PANEL
BUN: 13 mg/dL (ref 6–23)
CO2: 31 mEq/L (ref 19–32)
Calcium: 9.4 mg/dL (ref 8.4–10.5)
Chloride: 96 mEq/L (ref 96–112)
Creatinine, Ser: 0.66 mg/dL (ref 0.40–1.20)
GFR: 86.1 mL/min (ref >60.00)
Glucose, Bld: 94 mg/dL (ref 70–99)
Potassium: 5 mEq/L (ref 3.5–5.1)
Sodium: 128 mEq/L — ABNORMAL LOW (ref 135–145)

## 2019-07-14 NOTE — Telephone Encounter (Signed)
Pt was wondering about why lab was ordered before Prolia injection.  Advised pt need nml calcium lab w/i 30 days of Prolia injection.  Pt understands and agrees.

## 2019-07-14 NOTE — Telephone Encounter (Signed)
Ca is normal and CrCl is 84.25. Labs ok for pt to have inj on 6/22

## 2019-07-16 ENCOUNTER — Other Ambulatory Visit: Payer: Self-pay | Admitting: Internal Medicine

## 2019-07-16 DIAGNOSIS — E871 Hypo-osmolality and hyponatremia: Secondary | ICD-10-CM

## 2019-07-16 NOTE — Telephone Encounter (Signed)
I spoke to pt about lab results and schedule 2 wk lab only appointment

## 2019-07-27 ENCOUNTER — Ambulatory Visit (INDEPENDENT_AMBULATORY_CARE_PROVIDER_SITE_OTHER): Payer: Medicare HMO | Admitting: *Deleted

## 2019-07-27 ENCOUNTER — Other Ambulatory Visit (INDEPENDENT_AMBULATORY_CARE_PROVIDER_SITE_OTHER): Payer: Medicare HMO

## 2019-07-27 DIAGNOSIS — E871 Hypo-osmolality and hyponatremia: Secondary | ICD-10-CM | POA: Diagnosis not present

## 2019-07-27 DIAGNOSIS — M810 Age-related osteoporosis without current pathological fracture: Secondary | ICD-10-CM | POA: Diagnosis not present

## 2019-07-27 MED ORDER — DENOSUMAB 60 MG/ML ~~LOC~~ SOSY
60.0000 mg | PREFILLED_SYRINGE | Freq: Once | SUBCUTANEOUS | Status: AC
  Administered 2019-07-27: 60 mg via SUBCUTANEOUS

## 2019-07-27 NOTE — Progress Notes (Signed)
Per orders of Webb Silversmith, NP, injection of Prolia given by Virl Cagey. Patient tolerated injection well.

## 2019-07-28 LAB — BASIC METABOLIC PANEL
BUN: 18 mg/dL (ref 6–23)
CO2: 27 mEq/L (ref 19–32)
Calcium: 9.4 mg/dL (ref 8.4–10.5)
Chloride: 99 mEq/L (ref 96–112)
Creatinine, Ser: 0.66 mg/dL (ref 0.40–1.20)
GFR: 86.1 mL/min (ref >60.00)
Glucose, Bld: 91 mg/dL (ref 70–99)
Potassium: 4.6 mEq/L (ref 3.5–5.1)
Sodium: 130 mEq/L — ABNORMAL LOW (ref 135–145)

## 2019-07-30 ENCOUNTER — Telehealth: Payer: Self-pay | Admitting: Internal Medicine

## 2019-07-30 NOTE — Telephone Encounter (Signed)
Patient called in regards to her lab results.  She wanted to see if the results have come back

## 2019-08-02 ENCOUNTER — Other Ambulatory Visit: Payer: Self-pay | Admitting: Internal Medicine

## 2019-08-05 ENCOUNTER — Encounter: Payer: Self-pay | Admitting: Internal Medicine

## 2019-08-05 ENCOUNTER — Other Ambulatory Visit: Payer: Self-pay

## 2019-08-05 ENCOUNTER — Ambulatory Visit (INDEPENDENT_AMBULATORY_CARE_PROVIDER_SITE_OTHER): Payer: Medicare HMO | Admitting: Internal Medicine

## 2019-08-05 VITALS — BP 144/92 | HR 71 | Temp 96.7°F | Wt 174.0 lb

## 2019-08-05 DIAGNOSIS — K219 Gastro-esophageal reflux disease without esophagitis: Secondary | ICD-10-CM

## 2019-08-05 DIAGNOSIS — R1013 Epigastric pain: Secondary | ICD-10-CM

## 2019-08-05 MED ORDER — OMEPRAZOLE 40 MG PO CPDR: 40.0000 mg | DELAYED_RELEASE_CAPSULE | Freq: Every day | ORAL | 3 refills | Status: DC

## 2019-08-05 NOTE — Progress Notes (Signed)
Subjective:    Patient ID: Lacey Brown, female    DOB: 09/25/1940, 79 y.o.   MRN: [id number]  HPI  Pt presents to the clinic today with c/o epigastric pain. She reports this has been intermittent over the last 2 weeks. She denies nausea, vomiting, diarrhea or blood in her stool. She has had some constipation and has tried increased fiber by introducing a lot more fresh fruits and veggies in her diet. She denies chest pain, chest tightness or SOB. She denies fever, chills or body aches. She has tried Omeprazole OTC with some relief of symptoms.   Review of Systems      Past Medical History:  Diagnosis Date  . Arthritis    back, neck; right shoulder;   . Asthma    using advair prn  . Chicken pox   . Hyperlipidemia   . Hypertension    controlled with medication;     Current Outpatient Medications  Medication Sig Dispense Refill  . amLODipine (NORVASC) 5 MG tablet Take 1 tablet (5 mg total) by mouth daily. 90 tablet 3  . Cholecalciferol (VITAMIN D3) 2000 UNITS TABS Take 1 tablet by mouth daily.    Marland Kitchen docusate sodium (COLACE) 100 MG capsule Take 1 capsule (100 mg total) by mouth daily. 30 capsule 0  . Fluticasone-Salmeterol (ADVAIR DISKUS) 250-50 MCG/DOSE AEPB TAKE 1 PUFF BY MOUTH TWICE A DAY **RINSE MOUTH AFTER USE** 180 each 2  . ibuprofen (ADVIL,MOTRIN) 400 MG tablet Take 400 mg by mouth daily as needed.     Marland Kitchen losartan (COZAAR) 100 MG tablet TAKE 1 TABLET BY MOUTH EVERY DAY 90 tablet 0  . metoprolol succinate (TOPROL-XL) 25 MG 24 hr tablet TAKE 1 TABLET BY MOUTH EVERY DAY 90 tablet 0  . Multiple Minerals-Vitamins (CALCIUM CITRATE PLUS PO) Take 1 tablet by mouth daily.    . Omega-3 Fatty Acids (FISH OIL) 1000 MG CAPS Take 1 capsule by mouth daily.    . VENTOLIN HFA 108 (90 Base) MCG/ACT inhaler TAKE 2 PUFFS BY MOUTH EVERY 6 HOURS AS NEEDED FOR WHEEZE OR SHORTNESS OF BREATH 1 Inhaler 2  . vitamin C (ASCORBIC ACID) 500 MG tablet Take 1,000 mg by mouth daily as needed.       No current facility-administered medications for this visit.    Allergies  Allergen Reactions  . Ace Inhibitors Cough    Other reaction(s): Cough  . Codeine Nausea Only    Family History  Problem Relation Age of Onset  . Heart disease Mother   . Heart disease Father   . Arthritis Maternal Grandmother   . Diabetes Neg Hx   . Cancer Neg Hx   . Stroke Neg Hx     Social History   Socioeconomic History  . Marital status: Widowed    Spouse name: Not on file  . Number of children: Not on file  . Years of education: Not on file  . Highest education level: Not on file  Occupational History  . Not on file  Tobacco Use  . Smoking status: Former Smoker    Quit date: 04/17/2009    Years since quitting: 10.3  . Smokeless tobacco: Never Used  Vaping Use  . Vaping Use: Never used  Substance and Sexual Activity  . Alcohol use: Yes    Alcohol/week: 0.0 standard drinks    Comment: occasional  . Drug use: No  . Sexual activity: Never  Other Topics Concern  . Not on file  Social History Narrative  .  Not on file   Social Determinants of Health   Financial Resource Strain:   . Difficulty of Paying Living Expenses:   Food Insecurity:   . Worried About Charity fundraiser in the Last Year:   . Arboriculturist in the Last Year:   Transportation Needs:   . Film/video editor (Medical):   Marland Kitchen Lack of Transportation (Non-Medical):   Physical Activity:   . Days of Exercise per Week:   . Minutes of Exercise per Session:   Stress:   . Feeling of Stress :   Social Connections:   . Frequency of Communication with Friends and Family:   . Frequency of Social Gatherings with Friends and Family:   . Attends Religious Services:   . Active Member of Clubs or Organizations:   . Attends Archivist Meetings:   Marland Kitchen Marital Status:   Intimate Partner Violence:   . Fear of Current or Ex-Partner:   . Emotionally Abused:   Marland Kitchen Physically Abused:   . Sexually Abused:       Constitutional: Denies fever, malaise, fatigue, headache or abrupt weight changes.  Respiratory: Denies difficulty breathing, shortness of breath, cough or sputum production.   Cardiovascular: Denies chest pain, chest tightness, palpitations or swelling in the hands or feet.  Gastrointestinal: Pt reports epigastric pain, intermittent constipation. Denies bloating,  diarrhea or blood in the stool.  GU: Denies urgency, frequency, pain with urination, burning sensation, blood in urine, odor or discharge.  No other specific complaints in a complete review of systems (except as listed in HPI above).  Objective:   Physical Exam   BP (!) 144/92   Pulse 71   Temp (!) 96.7 F (35.9 C) (Temporal)   Wt 174 lb (78.9 kg)   SpO2 97%   BMI 35.14 kg/m   Wt Readings from Last 3 Encounters:  06/22/19 173 lb (78.5 kg)  06/15/19 175 lb 6.4 oz (79.6 kg)  06/03/19 176 lb (79.8 kg)    General: Appears her stated age, obese, in NAD.  Cardiovascular: Normal rate and rhythm. S1,S2 noted.  No murmur, rubs or gallops noted. Pulmonary/Chest: Normal effort and positive vesicular breath sounds. No respiratory distress. No wheezes, rales or ronchi noted.  Abdomen: Soft and nontender. Normal bowel sounds. No distention or masses noted.  Neurological: Alert and oriented.    BMET    Component Value Date/Time   NA 130 (L) 07/27/2019 1507   K 4.6 07/27/2019 1507   CL 99 07/27/2019 1507   CO2 27 07/27/2019 1507   GLUCOSE 91 07/27/2019 1507   BUN 18 07/27/2019 1507   CREATININE 0.66 07/27/2019 1507   CALCIUM 9.4 07/27/2019 1507    Lipid Panel     Component Value Date/Time   CHOL 196 12/01/2018 1305   TRIG 56.0 12/01/2018 1305   HDL 72.20 12/01/2018 1305   CHOLHDL 3 12/01/2018 1305   VLDL 11.2 12/01/2018 1305   LDLCALC 112 (H) 12/01/2018 1305    CBC    Component Value Date/Time   WBC 7.3 12/01/2018 1305   RBC 4.13 12/01/2018 1305   HGB 12.7 12/01/2018 1305   HCT 38.6 12/01/2018 1305    PLT 220.0 12/01/2018 1305   MCV 93.2 12/01/2018 1305   MCHC 32.9 12/01/2018 1305   RDW 13.4 12/01/2018 1305    Hgb A1C No results found for: HGBA1C        Assessment & Plan:  Epigastric Pain, GERD:  RX for Omeprazole 40 mg PO  daily prn Avoid foods that trigger reflux If symptoms worsen, consider referral to GI for upper GI  Return precautions discussed  Webb Silversmith, NP This visit occurred during the SARS-CoV-2 public health emergency.  Safety protocols were in place, including screening questions prior to the visit, additional usage of staff PPE, and extensive cleaning of exam room while observing appropriate contact time as indicated for disinfecting solutions.

## 2019-08-05 NOTE — Patient Instructions (Signed)

## 2019-08-31 ENCOUNTER — Telehealth: Payer: Self-pay | Admitting: Internal Medicine

## 2019-08-31 DIAGNOSIS — R1013 Epigastric pain: Secondary | ICD-10-CM

## 2019-08-31 NOTE — Telephone Encounter (Signed)
Patient called in stating she would like a call from PCP herself in regards to her omeprazole (PRILOSEC) 40 MG capsule. Patient stated she does not feel like this medication is helping her any and she is still having stomach issues. Please advise.

## 2019-08-31 NOTE — Telephone Encounter (Signed)
Per my last note, she will need to see GI for upper endoscopy if medication is not working.

## 2019-09-02 NOTE — Telephone Encounter (Signed)
Referral placed.

## 2019-09-02 NOTE — Telephone Encounter (Signed)
Pt would like to proceed with referral to GI, prefers [facility]

## 2019-09-02 NOTE — Addendum Note (Signed)
Addended by: Jearld Fenton on: 09/02/2019 11:04 AM   Modules accepted: Orders

## 2019-09-07 ENCOUNTER — Telehealth: Payer: Self-pay | Admitting: *Deleted

## 2019-09-07 ENCOUNTER — Encounter: Payer: Self-pay | Admitting: Physician Assistant

## 2019-09-07 NOTE — Telephone Encounter (Signed)
Pt called in asking about her referral. She said that the GI doctor in [place] can't see her until Oct and she doesn't think she should wait that long. I did advise her that GI is usually booked out a few months ahead and that is normal, and she should call and see if they have a cancellation list.  Pt then asked me to send a message to the [facility] to see if she goes to a GI in [place] will they be able to see her sooner then Oct  CB [phone]

## 2019-09-07 NOTE — Telephone Encounter (Signed)
Called [facility] and was able to schedule pt for a sooner appt on 10/22/19 at 11:15. Pt Will call to cancel [facility] appt if she decides to go with [facility].

## 2019-10-07 DIAGNOSIS — R69 Illness, unspecified: Secondary | ICD-10-CM | POA: Diagnosis not present

## 2019-10-22 ENCOUNTER — Ambulatory Visit: Payer: Medicare HMO | Admitting: Physician Assistant

## 2019-10-27 ENCOUNTER — Other Ambulatory Visit: Payer: Self-pay | Admitting: Internal Medicine

## 2019-11-17 ENCOUNTER — Other Ambulatory Visit: Payer: Self-pay

## 2019-11-17 ENCOUNTER — Ambulatory Visit: Payer: Medicare HMO | Admitting: Gastroenterology

## 2019-11-17 ENCOUNTER — Encounter: Payer: Self-pay | Admitting: Gastroenterology

## 2019-11-17 VITALS — BP 164/78 | HR 65 | Temp 97.9°F | Ht 59.0 in | Wt 174.4 lb

## 2019-11-17 DIAGNOSIS — G8929 Other chronic pain: Secondary | ICD-10-CM

## 2019-11-17 DIAGNOSIS — M25569 Pain in unspecified knee: Secondary | ICD-10-CM | POA: Insufficient documentation

## 2019-11-17 DIAGNOSIS — M25562 Pain in left knee: Secondary | ICD-10-CM | POA: Insufficient documentation

## 2019-11-17 DIAGNOSIS — R1013 Epigastric pain: Secondary | ICD-10-CM

## 2019-11-17 NOTE — Progress Notes (Signed)
Cephas Darby, MD [address]  [place]  [place], [place] [address]  Main: [phone]  Fax: [phone]    Gastroenterology Consultation  Referring Provider:     Jearld Fenton, NP Primary Care Physician:  Jearld Fenton, NP Primary Gastroenterologist:  Dr. Cephas Darby Reason for Consultation:     Chronic epigastric discomfort        HPI:   Lacey Brown is a 79 y.o. female referred by Dr. Jearld Fenton, NP  for consultation & management of chronic epigastric discomfort.  Patient has been experiencing mild epigastric pain, triggered after eating spicy foods for last 2 months.  She reported having history of constipation, which is currently managed with dietary modification.  She was intermittently taking omeprazole in the past as needed for heartburn, epigastric pain.  She was advised to take Prilosec 40 mg daily which resulted in diarrhea.  Therefore, switched back to 20 mg as needed.  Patient reports that her pain is triggered only after eating spicy foods.  She denies any radiating pain, nausea, vomiting, bloating, early satiety, weight loss.  CBC is normal.  She was told that she had ulcer in the past.  She wanted to make sure she does not have ulcer at this time.  She denies melena, rectal bleeding, nausea or vomiting, heartburn, dysphagia  NSAIDs: Ibuprofen 400 mg 1-2 times daily as needed for back pain  Antiplts/Anticoagulants/Anti thrombotics: None  GI Procedures: Colonoscopy in 09/17/2005  Past Medical History:  Diagnosis Date  . Arthritis    back, neck; right shoulder;   . Asthma    using advair prn  . Chicken pox   . Hyperlipidemia   . Hypertension    controlled with medication;     Past Surgical History:  Procedure Laterality Date  . BREAST EXCISIONAL BIOPSY Left   . TONSILLECTOMY AND ADENOIDECTOMY      Current Outpatient Medications:  .  amLODipine (NORVASC) 5 MG tablet, Take 1 tablet (5 mg total) by mouth daily., Disp: 90 tablet,  Rfl: 3 .  Cholecalciferol (VITAMIN D3) 2000 UNITS TABS, Take 1 tablet by mouth daily., Disp: , Rfl:  .  docusate sodium (COLACE) 100 MG capsule, Take 1 capsule (100 mg total) by mouth daily., Disp: 30 capsule, Rfl: 0 .  Fluticasone-Salmeterol (ADVAIR DISKUS) 250-50 MCG/DOSE AEPB, TAKE 1 PUFF BY MOUTH TWICE A DAY **RINSE MOUTH AFTER USE**, Disp: 180 each, Rfl: 2 .  ibuprofen (ADVIL,MOTRIN) 400 MG tablet, Take 400 mg by mouth daily as needed. , Disp: , Rfl:  .  losartan (COZAAR) 100 MG tablet, TAKE 1 TABLET BY MOUTH EVERY DAY, Disp: 90 tablet, Rfl: 0 .  metoprolol succinate (TOPROL-XL) 25 MG 24 hr tablet, TAKE 1 TABLET BY MOUTH EVERY DAY, Disp: 90 tablet, Rfl: 0 .  Multiple Minerals-Vitamins (CALCIUM CITRATE PLUS PO), Take 1 tablet by mouth daily., Disp: , Rfl:  .  Omega-3 Fatty Acids (FISH OIL) 1000 MG CAPS, Take 1 capsule by mouth daily., Disp: , Rfl:  .  omeprazole (PRILOSEC) 40 MG capsule, Take 1 capsule (40 mg total) by mouth daily., Disp: 90 capsule, Rfl: 3 .  VENTOLIN HFA 108 (90 Base) MCG/ACT inhaler, TAKE 2 PUFFS BY MOUTH EVERY 6 HOURS AS NEEDED FOR WHEEZE OR SHORTNESS OF BREATH, Disp: 1 Inhaler, Rfl: 2 .  vitamin C (ASCORBIC ACID) 500 MG tablet, Take 1,000 mg by mouth daily as needed. , Disp: , Rfl:  .  prednisoLONE acetate (PRED FORTE) 1 % ophthalmic  suspension, , Disp: , Rfl:    Family History  Problem Relation Age of Onset  . Heart disease Mother   . Heart disease Father   . Arthritis Maternal Grandmother   . Diabetes Neg Hx   . Cancer Neg Hx   . Stroke Neg Hx      Social History   Tobacco Use  . Smoking status: Former Smoker    Quit date: 04/17/2009    Years since quitting: 10.5  . Smokeless tobacco: Never Used  Vaping Use  . Vaping Use: Never used  Substance Use Topics  . Alcohol use: Yes    Alcohol/week: 0.0 standard drinks    Comment: occasional  . Drug use: No    Allergies as of 11/17/2019 - Review Complete 11/17/2019  Allergen Reaction Noted  . Ace  inhibitors Cough 03/10/2013  . Codeine Nausea Only 04/18/2014    Review of Systems:    All systems reviewed and negative except where noted in HPI.   Physical Exam:  BP (!) 164/78 (BP Location: Left Arm, Patient Position: Sitting, Cuff Size: Large)   Pulse 65   Temp 97.9 F (36.6 C) (Oral)   Ht 4\' 11"  (1.499 m)   Wt 174 lb 6.4 oz (79.1 kg)   BMI 35.22 kg/m  No LMP recorded. Patient is postmenopausal.  General:   Alert,  Well-developed, well-nourished, pleasant and cooperative in NAD Head:  Normocephalic and atraumatic. Eyes:  Sclera clear, no icterus.   Conjunctiva pink. Ears:  Normal auditory acuity. Nose:  No deformity, discharge, or lesions. Mouth:  No deformity or lesions,oropharynx pink & moist. Neck:  Supple; no masses or thyromegaly. Lungs:  Respirations even and unlabored.  Clear throughout to auscultation.   No wheezes, crackles, or rhonchi. No acute distress. Heart:  Regular rate and rhythm; no murmurs, clicks, rubs, or gallops. Abdomen:  Normal bowel sounds. Soft, morbidly obese, non-tender and non-distended without masses, hepatosplenomegaly or hernias noted.  No guarding or rebound tenderness.   Rectal: Not performed Msk:  Symmetrical without gross deformities. Good, equal movement & strength bilaterally. Pulses:  Normal pulses noted. Extremities:  No clubbing or edema.  No cyanosis. Neurologic:  Alert and oriented x3;  grossly normal neurologically. Skin:  Intact without significant lesions or rashes. No jaundice. Psych:  Alert and cooperative. Normal mood and affect.  Imaging Studies: Reviewed  Assessment and Plan:   Lacey Brown is a 79 y.o. pleasant Caucasian female with obesity, BMI 35, hypertension, hyperlipidemia is seen in consultation for 2 months history of intermittent, mild epigastric discomfort triggered with spicy foods.  No other alarm signs or symptoms.  Given her age, I recommended upper GI series and upper endoscopy and patient deferred  at this time.  She said she will contact my office if her symptoms are worsening Advised her to cut back on intake of NSAIDs, try extra strength Tylenol alternating with ibuprofen as needed for back pain Continue omeprazole 20 mg 1-2 times daily or Pepcid as needed  Follow up as needed   Cephas Darby, MD

## 2019-12-02 ENCOUNTER — Encounter: Payer: Self-pay | Admitting: Internal Medicine

## 2019-12-02 ENCOUNTER — Ambulatory Visit (INDEPENDENT_AMBULATORY_CARE_PROVIDER_SITE_OTHER): Payer: Medicare HMO | Admitting: Internal Medicine

## 2019-12-02 ENCOUNTER — Other Ambulatory Visit: Payer: Self-pay

## 2019-12-02 VITALS — BP 148/86 | HR 68 | Temp 96.7°F | Ht 59.0 in | Wt 173.0 lb

## 2019-12-02 DIAGNOSIS — L989 Disorder of the skin and subcutaneous tissue, unspecified: Secondary | ICD-10-CM | POA: Diagnosis not present

## 2019-12-02 DIAGNOSIS — I10 Essential (primary) hypertension: Secondary | ICD-10-CM | POA: Diagnosis not present

## 2019-12-02 DIAGNOSIS — M545 Low back pain, unspecified: Secondary | ICD-10-CM

## 2019-12-02 DIAGNOSIS — Z0000 Encounter for general adult medical examination without abnormal findings: Secondary | ICD-10-CM

## 2019-12-02 DIAGNOSIS — J4530 Mild persistent asthma, uncomplicated: Secondary | ICD-10-CM | POA: Diagnosis not present

## 2019-12-02 DIAGNOSIS — K219 Gastro-esophageal reflux disease without esophagitis: Secondary | ICD-10-CM | POA: Diagnosis not present

## 2019-12-02 DIAGNOSIS — M1990 Unspecified osteoarthritis, unspecified site: Secondary | ICD-10-CM | POA: Diagnosis not present

## 2019-12-02 DIAGNOSIS — S32000D Wedge compression fracture of unspecified lumbar vertebra, subsequent encounter for fracture with routine healing: Secondary | ICD-10-CM | POA: Diagnosis not present

## 2019-12-02 DIAGNOSIS — G8929 Other chronic pain: Secondary | ICD-10-CM

## 2019-12-02 DIAGNOSIS — E7800 Pure hypercholesterolemia, unspecified: Secondary | ICD-10-CM | POA: Diagnosis not present

## 2019-12-02 DIAGNOSIS — E559 Vitamin D deficiency, unspecified: Secondary | ICD-10-CM | POA: Diagnosis not present

## 2019-12-02 LAB — COMPREHENSIVE METABOLIC PANEL
ALT: 14 U/L (ref 0–35)
AST: 15 U/L (ref 0–37)
Albumin: 4.1 g/dL (ref 3.5–5.2)
Alkaline Phosphatase: 35 U/L — ABNORMAL LOW (ref 39–117)
BUN: 17 mg/dL (ref 6–23)
CO2: 28 mEq/L (ref 19–32)
Calcium: 9.1 mg/dL (ref 8.4–10.5)
Chloride: 100 mEq/L (ref 96–112)
Creatinine, Ser: 0.73 mg/dL (ref 0.40–1.20)
GFR: 77.52 mL/min (ref >60.00)
Glucose, Bld: 96 mg/dL (ref 70–99)
Potassium: 4.6 mEq/L (ref 3.5–5.1)
Sodium: 133 mEq/L — ABNORMAL LOW (ref 135–145)
Total Bilirubin: 0.3 mg/dL (ref 0.2–1.2)
Total Protein: 6.2 g/dL (ref 6.0–8.3)

## 2019-12-02 LAB — LIPID PANEL
Cholesterol: 179 mg/dL (ref 0–200)
HDL: 67.6 mg/dL (ref >39.00)
LDL Cholesterol: 96 mg/dL (ref 0–99)
NonHDL: 110.92
Total CHOL/HDL Ratio: 3
Triglycerides: 75 mg/dL (ref 0.0–149.0)
VLDL: 15 mg/dL (ref 0.0–40.0)

## 2019-12-02 LAB — CBC
HCT: 37.2 % (ref 36.0–46.0)
Hemoglobin: 12.5 g/dL (ref 12.0–15.0)
MCHC: 33.5 g/dL (ref 30.0–36.0)
MCV: 91.4 fl (ref 78.0–100.0)
Platelets: 231 10*3/uL (ref 150.0–400.0)
RBC: 4.07 Mil/uL (ref 3.87–5.11)
RDW: 13.4 % (ref 11.5–15.5)
WBC: 7.3 10*3/uL (ref 4.0–10.5)

## 2019-12-02 LAB — VITAMIN D 25 HYDROXY (VIT D DEFICIENCY, FRACTURES): VITD: 32.49 ng/mL (ref 30.00–100.00)

## 2019-12-02 NOTE — Assessment & Plan Note (Signed)
Encouraged weight loss Continue Tylenol and Ibuprofen Will refer to physical medicine to discuss back injections

## 2019-12-02 NOTE — Assessment & Plan Note (Signed)
Continue Advair and Albuterol Will monitor

## 2019-12-02 NOTE — Progress Notes (Signed)
HPI:  Pt presents to the clinic today for her subsequent Medicare Wellness Exam. She is also due to follow up chronic conditions.  OA: Mainly in her back. History of multiple compression fractures. She alternates Tylenol and Ibuprofen with minimal relief. She would be interested in getting a back injection.  Asthma: She reports intermittent SOB in the am, no chronic cough. She takes Advair as prescribed, Albuterol as needed. There are no PFT's on file. She does not follow with rheumatology. HTN: Her BP today is. She is taking Losartan and Metoprolol as prescribed. ECG from 03/2018 reviewed.  GERD: She is not sure what triggers this. She denies breakthrough on Omeprazole. She has not had an upper GI. She has recently seen GI for the same.  HTN: Her BP today is 148/86. She is taking Losartan and Metoprolol as prescribed. ECG from 03/2018 reviewed.  Past Medical History:  Diagnosis Date  . Arthritis    back, neck; right shoulder;   . Asthma    using advair prn  . Chicken pox   . Hyperlipidemia   . Hypertension    controlled with medication;     Current Outpatient Medications  Medication Sig Dispense Refill  . Cholecalciferol (VITAMIN D3) 2000 UNITS TABS Take 1 tablet by mouth daily.    . Fluticasone-Salmeterol (ADVAIR DISKUS) 250-50 MCG/DOSE AEPB TAKE 1 PUFF BY MOUTH TWICE A DAY **RINSE MOUTH AFTER USE** 180 each 2  . ibuprofen (ADVIL,MOTRIN) 400 MG tablet Take 400 mg by mouth daily as needed.     Marland Kitchen losartan (COZAAR) 100 MG tablet TAKE 1 TABLET BY MOUTH EVERY DAY 90 tablet 0  . metoprolol succinate (TOPROL-XL) 25 MG 24 hr tablet TAKE 1 TABLET BY MOUTH EVERY DAY 90 tablet 0  . Multiple Minerals-Vitamins (CALCIUM CITRATE PLUS PO) Take 1 tablet by mouth daily.    . Omega-3 Fatty Acids (FISH OIL) 1000 MG CAPS Take 1 capsule by mouth daily.    Marland Kitchen omeprazole (PRILOSEC OTC) 20 MG tablet Take 20 mg by mouth as needed.     . prednisoLONE acetate (PRED FORTE) 1 % ophthalmic suspension     .  VENTOLIN HFA 108 (90 Base) MCG/ACT inhaler TAKE 2 PUFFS BY MOUTH EVERY 6 HOURS AS NEEDED FOR WHEEZE OR SHORTNESS OF BREATH 1 Inhaler 2  . vitamin C (ASCORBIC ACID) 500 MG tablet Take 1,000 mg by mouth daily as needed.     Marland Kitchen amLODipine (NORVASC) 5 MG tablet Take 1 tablet (5 mg total) by mouth daily. (Patient not taking: Reported on 12/02/2019) 90 tablet 3   No current facility-administered medications for this visit.    Allergies  Allergen Reactions  . Ace Inhibitors Cough    Other reaction(s): Cough  . Codeine Nausea Only    Family History  Problem Relation Age of Onset  . Heart disease Mother   . Heart disease Father   . Arthritis Maternal Grandmother   . Diabetes Neg Hx   . Cancer Neg Hx   . Stroke Neg Hx     Social History   Socioeconomic History  . Marital status: Widowed    Spouse name: Not on file  . Number of children: Not on file  . Years of education: Not on file  . Highest education level: Not on file  Occupational History  . Not on file  Tobacco Use  . Smoking status: Former Smoker    Quit date: 04/17/2009    Years since quitting: 10.6  . Smokeless tobacco: Never Used  Vaping Use  . Vaping Use: Never used  Substance and Sexual Activity  . Alcohol use: Yes    Alcohol/week: 0.0 standard drinks    Comment: occasional  . Drug use: No  . Sexual activity: Never  Other Topics Concern  . Not on file  Social History Narrative  . Not on file   Social Determinants of Health   Financial Resource Strain:   . Difficulty of Paying Living Expenses: Not on file  Food Insecurity:   . Worried About Charity fundraiser in the Last Year: Not on file  . Ran Out of Food in the Last Year: Not on file  Transportation Needs:   . Lack of Transportation (Medical): Not on file  . Lack of Transportation (Non-Medical): Not on file  Physical Activity:   . Days of Exercise per Week: Not on file  . Minutes of Exercise per Session: Not on file  Stress:   . Feeling of Stress :  Not on file  Social Connections:   . Frequency of Communication with Friends and Family: Not on file  . Frequency of Social Gatherings with Friends and Family: Not on file  . Attends Religious Services: Not on file  . Active Member of Clubs or Organizations: Not on file  . Attends Archivist Meetings: Not on file  . Marital Status: Not on file  Intimate Partner Violence:   . Fear of Current or Ex-Partner: Not on file  . Emotionally Abused: Not on file  . Physically Abused: Not on file  . Sexually Abused: Not on file    Hospitiliaztions: None  Health Maintenance:    Flu: 10/2019  Tetanus: > 10 years ago  Pneumovax: 10/2008  Prevnar: 06/2014  Zostavax: 11/2009  Shingrix: 2020  Covid: Pfizer  Mammogram: 10/2018  Pap Smear: no longer screening  Bone Density: 10/2018  Colon Screening: no longer screening  Eye Doctor: annally  Dental Exam: biannually   Providers:   POE:UMPNTI Garnette Gunner, NP  Cardiologist: Dr. Marlou Porch  Gastroenterologist: Dr. Marius Ditch    I have personally reviewed and have noted:  1. [name] medical and social history 2. Their use of alcohol, tobacco or illicit drugs 3. Their current medications and supplements 4. [name] functional ability including ADL's, fall risks, home safety risks and hearing or visual impairment. 5. Diet and physical activities 6. Evidence for depression or mood disorder  Subjective:   Review of Systems:   Constitutional: Denies fever, malaise, fatigue, headache or abrupt weight changes.  HEENT: Denies eye pain, eye redness, ear pain, ringing in the ears, wax buildup, runny nose, nasal congestion, bloody nose, or sore throat. Respiratory: Pt reports intermittent shortness of breath. Denies difficulty breathing, cough or sputum production.   Cardiovascular: Pt reports intermittent swelling of feet. Denies chest pain, chest tightness, palpitations or swelling in the hands.  Gastrointestinal: Denies abdominal pain,  bloating, constipation, diarrhea or blood in the stool.  GU: Denies urgency, frequency, pain with urination, burning sensation, blood in urine, odor or discharge. Musculoskeletal: Pt reports chronic low back pain. Denies decrease in range of motion, difficulty with gait, muscle pain or joint swelling.  Skin: Denies redness, rashes, lesions or ulcercations.  Neurological: Denies dizziness, difficulty with memory, difficulty with speech or problems with balance and coordination.  Psych: Denies anxiety, depression, SI/HI.  No other specific complaints in a complete review of systems (except as listed in HPI above).  Objective:  PE:   BP (!) 148/86   Pulse 68  Temp (!) 96.7 F (35.9 C) (Temporal)   Ht 4\' 11"  (1.499 m)   Wt 173 lb (78.5 kg)   SpO2 96%   BMI 34.94 kg/m  Wt Readings from Last 3 Encounters:  12/02/19 173 lb (78.5 kg)  11/17/19 174 lb 6.4 oz (79.1 kg)  08/05/19 174 lb (78.9 kg)    General: Appears her stated age, obese, in NAD. Skin: Warm, dry and intact. 1 cm raised scaly lesion noted behind right ear. HEENT: Head: normal shape and size; Eyes: sclera white, no icterus, conjunctiva pink, PERRLA and EOMs intact;  Neck: Neck supple, trachea midline. No masses, lumps or thyromegaly present.  Cardiovascular: Normal rate and rhythm. S1,S2 noted.  No murmur, rubs or gallops noted. No JVD or BLE edema. No carotid bruits noted. Pulmonary/Chest: Normal effort and positive vesicular breath sounds. No respiratory distress. No wheezes, rales or ronchi noted.  Abdomen: Soft and nontender. Normal bowel sounds. No distention or masses noted. Liver, spleen and kidneys non palpable. Musculoskeletal: Pinpoint tenderness noted over the lumbar spine. Strength 5/5 BUE/BLE. No difficulty with gait. Neurological: Alert and oriented. Cranial nerves II-XII grossly intact. Coordination normal.  Psychiatric: Mood and affect normal. Behavior is normal. Judgment and thought content normal.    EKG:  BMET    Component Value Date/Time   NA 130 (L) 07/27/2019 1507   K 4.6 07/27/2019 1507   CL 99 07/27/2019 1507   CO2 27 07/27/2019 1507   GLUCOSE 91 07/27/2019 1507   BUN 18 07/27/2019 1507   CREATININE 0.66 07/27/2019 1507   CALCIUM 9.4 07/27/2019 1507    Lipid Panel     Component Value Date/Time   CHOL 196 12/01/2018 1305   TRIG 56.0 12/01/2018 1305   HDL 72.20 12/01/2018 1305   CHOLHDL 3 12/01/2018 1305   VLDL 11.2 12/01/2018 1305   LDLCALC 112 (H) 12/01/2018 1305    CBC    Component Value Date/Time   WBC 7.3 12/01/2018 1305   RBC 4.13 12/01/2018 1305   HGB 12.7 12/01/2018 1305   HCT 38.6 12/01/2018 1305   PLT 220.0 12/01/2018 1305   MCV 93.2 12/01/2018 1305   MCHC 32.9 12/01/2018 1305   RDW 13.4 12/01/2018 1305    Hgb A1C No results found for: HGBA1C    Assessment and Plan:   Medicare Annual Wellness Visit:  Diet: She does eat meat. She consumes fruits and veggies daily. She tries to avoid fried foods. Physical activity: Sedentary Depression/mood screen: Negative, PHQ 9 score of 0 Hearing: Intact to whispered voice Visual acuity: Grossly normal, performs annual eye exam  ADLs: Capable Fall risk: None Home safety: Good Cognitive evaluation: Intact to orientation, naming, recall and repetition EOL planning: No adv directives, full code/ I agree  Preventative Medicine: Flu, pneumovax, prevnar, zostovax, shingrix and covid vaccines UTD. She declines tetanus for financial reasons- understands she should get this if she gets cut or bitten. She no longer wants to screen for cervical or colon cancer. She will call to schedule her mammogram. Will repeat Bone Density in 2022. Encouraged her to consume a balanced diet and exercise regimen. Advised her to see an eye doctor and dentist annually. Will check CBC, CMET, Lipid and Vit D today. Due dates for screening exams given to patient as part of her AVS.  Skin Lesion of Scalp:  Likely benign Will  refer to derm for evaluation, general skin check  Next appointment: 1 year, Medicare Wellness Exam   Webb Silversmith, NP This visit occurred during  the SARS-CoV-2 public health emergency.  Safety protocols were in place, including screening questions prior to the visit, additional usage of staff PPE, and extensive cleaning of exam room while observing appropriate contact time as indicated for disinfecting solutions.

## 2019-12-02 NOTE — Assessment & Plan Note (Signed)
Borderline control on Losartan and Metoprolol Reinforced DASH diet CMET today Will monitor

## 2019-12-02 NOTE — Assessment & Plan Note (Signed)
Continue Omeprazole  CBC and CMET today She no longer wants to follow with GI

## 2019-12-02 NOTE — Patient Instructions (Signed)

## 2019-12-08 ENCOUNTER — Other Ambulatory Visit: Payer: Self-pay | Admitting: Internal Medicine

## 2019-12-08 ENCOUNTER — Telehealth: Payer: Self-pay | Admitting: Internal Medicine

## 2019-12-08 DIAGNOSIS — Z1231 Encounter for screening mammogram for malignant neoplasm of breast: Secondary | ICD-10-CM

## 2019-12-08 NOTE — Telephone Encounter (Signed)
Patient called in stating she is needing to have a cortisone shot and asking if this is possible for her. Patient also wanted to inform PCP that she has not heard anything in regards for referral to dermatology. Please advise.

## 2019-12-09 NOTE — Telephone Encounter (Signed)
Patient has been advised referrals have been sent and to wait a few weeks for shceduling.

## 2019-12-09 NOTE — Telephone Encounter (Signed)
There is a referral into physiatry and dermatology. It can take weeks to get these schedule. She will have to be patient.

## 2019-12-09 NOTE — Telephone Encounter (Signed)
Pt called back. She is not wanting PT. She said she has the exercises she needs. She has does them. She wants injections in her back. She said that is what she and [place] discussed at the [facility].  I will send this message to Rollene Fare about the above message and I also need to send it to [place] ans she is asking about the status of her dermatology referral.

## 2019-12-09 NOTE — Telephone Encounter (Signed)
Left message to call office. What kind of cortisone injection?

## 2019-12-17 ENCOUNTER — Telehealth: Payer: Self-pay | Admitting: Internal Medicine

## 2019-12-17 NOTE — Telephone Encounter (Signed)
Pt called in ref to her Prolia which is due next month.  And she has questions concerning her blood work.  She says you referred her to a dermatologist who sent her a letter stating her appointment is 6 months out and she thinks that is too far out.  She cannot reach the office.  She leaves mssgs but they do not return her calls.  The dermatologist is [facility].  Pt is requesting a c/b in ref to both of the above issues.  [phone]

## 2019-12-17 NOTE — Telephone Encounter (Signed)
You please call her and see what questions she has.  Lacey Brown-can we have her seen sooner in [place]?

## 2019-12-22 NOTE — Telephone Encounter (Signed)
Pt  Called back wanting regina to call her regarding Cortizone injection in her back   Best number [phone]

## 2019-12-22 NOTE — Telephone Encounter (Signed)
Pt would like a copy of the last lab be mailed her.  And she wanted to know if she needs labs for her prolia  Best number [phone]  Transferred to [place] regarding derm

## 2019-12-24 NOTE — Telephone Encounter (Signed)
I spoke to pt and she had a few concerns  1st in reference to Derm referral--- has appt April 2022 and would like to be seen sooner  2nd referral in reference to possibility to receive steroid inj, canceled her [facility] rehad referral because they told her that they do not offer joint injection  3rd PROLIA--- pt is due for Prolia inj 01/26/2020.... has not heard from anyone in reference to scheduling nurse visit and if labs are required... explained to pt labs from CPE 12/02/2019 included calcium lab and may not be needed at this time

## 2019-12-24 NOTE — Telephone Encounter (Signed)
PA was done in the spring and is good through April 2022. Details below.  Charm Rings [place], [place]     12:33 PM Note Pt's next Prolia injection due 07-23-19.  Pt has [employer] MCR.  Prior auth done.  [id number], valid 06-03-19 to 06-02-20.  Pt would owe approx. $260 after insurance.  $240 for Prolia and $20 for admin fee.

## 2019-12-24 NOTE — Telephone Encounter (Signed)
Ok to use prior labs, derm referral has been addressed. Physiatry referral pending.

## 2019-12-24 NOTE — Telephone Encounter (Signed)
Contacted pt and advised her insurance benefits will be run for prolia in the next week or two and then we will see if it is ok with Rollene Fare to use the labs from 12/02/19. If those are ok then we can just schedule the prolia inj for after 12/22 since her last inj was 6/22. Pt anxious that she have the inj before the end of the year and would really like the prior labs used if possible. Advised this office would f/u with benefit info in about 2 wks. Pt verbalized understanding.  Labs from 12/02/19. Ca in normal range and CrCl is 76.72mL/min

## 2019-12-24 NOTE — Telephone Encounter (Signed)
Update Derm appt....  Tue 01/04/20 03:15 PM  Brown, Lacey Memory

## 2019-12-28 NOTE — Telephone Encounter (Signed)
Paperwork given to [employer] to run benefits.

## 2019-12-29 NOTE — Telephone Encounter (Signed)
  Benefits submitted: Yes authorization effective until 06/02/2020  Prior authorization needed: Yes  Out of pocket for patient: hold for benefits   Lab appointment : no app made   Nurse visit:  No appointment made   Lab order placed: Yes

## 2020-01-04 DIAGNOSIS — L820 Inflamed seborrheic keratosis: Secondary | ICD-10-CM | POA: Diagnosis not present

## 2020-01-05 ENCOUNTER — Other Ambulatory Visit: Payer: Self-pay | Admitting: Internal Medicine

## 2020-01-11 ENCOUNTER — Telehealth: Payer: Self-pay | Admitting: Internal Medicine

## 2020-01-11 NOTE — Telephone Encounter (Signed)
Pt cost is $25 admin fee. Pt had labs on 10/28. Ok per PCP to use labs from 10/28  CrCl is 76.17 Ca normal at 9.1  Pt can be scheduled for after 01/26/20 since her last Prolia inj was 07/27/19.  Scheduled pt for 12/28. Pt agreed.

## 2020-01-11 NOTE — Telephone Encounter (Signed)
Message is being addressed by Larene Beach.

## 2020-01-11 NOTE — Telephone Encounter (Signed)
Patient is wanting to know if we have ordered her prolia injection. Patient is wanting to schedule an appt. EM

## 2020-01-21 ENCOUNTER — Other Ambulatory Visit (HOSPITAL_COMMUNITY): Payer: Medicare HMO

## 2020-01-25 ENCOUNTER — Ambulatory Visit: Payer: Medicare HMO

## 2020-01-27 ENCOUNTER — Other Ambulatory Visit: Payer: Self-pay

## 2020-01-27 ENCOUNTER — Ambulatory Visit (HOSPITAL_COMMUNITY): Payer: Medicare HMO | Attending: Internal Medicine

## 2020-01-27 ENCOUNTER — Other Ambulatory Visit: Payer: Self-pay | Admitting: Internal Medicine

## 2020-01-27 DIAGNOSIS — I10 Essential (primary) hypertension: Secondary | ICD-10-CM | POA: Diagnosis not present

## 2020-01-27 DIAGNOSIS — I77810 Thoracic aortic ectasia: Secondary | ICD-10-CM

## 2020-01-27 LAB — ECHOCARDIOGRAM COMPLETE
Area-P 1/2: 3.2 cm2
S' Lateral: 2.1 cm

## 2020-02-01 ENCOUNTER — Other Ambulatory Visit: Payer: Self-pay

## 2020-02-01 ENCOUNTER — Telehealth: Payer: Self-pay | Admitting: *Deleted

## 2020-02-01 ENCOUNTER — Ambulatory Visit (INDEPENDENT_AMBULATORY_CARE_PROVIDER_SITE_OTHER): Payer: Medicare HMO

## 2020-02-01 DIAGNOSIS — M810 Age-related osteoporosis without current pathological fracture: Secondary | ICD-10-CM

## 2020-02-01 DIAGNOSIS — I77810 Thoracic aortic ectasia: Secondary | ICD-10-CM

## 2020-02-01 MED ORDER — DENOSUMAB 60 MG/ML ~~LOC~~ SOSY: 60.0000 mg | PREFILLED_SYRINGE | Freq: Once | SUBCUTANEOUS | 0 refills | Status: DC

## 2020-02-01 MED ORDER — DENOSUMAB 60 MG/ML ~~LOC~~ SOSY
60.0000 mg | PREFILLED_SYRINGE | Freq: Once | SUBCUTANEOUS | Status: AC
  Administered 2020-02-01: 60 mg via SUBCUTANEOUS

## 2020-02-01 NOTE — Telephone Encounter (Signed)
Normal left ventricular pump function. Mildly dilated ascending aorta 42 mm.  Repeat echocardiogram in 1 year to ensure stability of aorta.  Overall reassuring.  Donato Schultz, MD    Pt aware of results and to repeat echo in 1 year to monitor.  Order placed in Epic.  Pt aware she will be contacted to be scheduled.

## 2020-02-01 NOTE — Progress Notes (Signed)
Per orders of Nicki Reaper, FNP-C, injection of Prolia given by Donnamarie Poag in left SQ Patient tolerated injection well.

## 2020-02-10 DIAGNOSIS — H2511 Age-related nuclear cataract, right eye: Secondary | ICD-10-CM | POA: Diagnosis not present

## 2020-02-10 DIAGNOSIS — H02051 Trichiasis without entropian right upper eyelid: Secondary | ICD-10-CM | POA: Diagnosis not present

## 2020-03-09 ENCOUNTER — Ambulatory Visit: Payer: Medicare HMO

## 2020-03-28 ENCOUNTER — Other Ambulatory Visit: Payer: Self-pay

## 2020-03-28 ENCOUNTER — Ambulatory Visit
Admission: RE | Admit: 2020-03-28 | Discharge: 2020-03-28 | Disposition: A | Discharge status: Home / Self Care | Payer: Medicare HMO | Source: Ambulatory Visit | Attending: Internal Medicine | Admitting: Internal Medicine

## 2020-03-28 DIAGNOSIS — Z1231 Encounter for screening mammogram for malignant neoplasm of breast: Secondary | ICD-10-CM

## 2020-03-28 IMAGING — MG MM DIGITAL SCREENING BILAT W/ TOMO AND CAD
6 of 12 series · 6 of 36 positions shown · non-contrast
Comparison: Previous exam(s).

CLINICAL DATA: Screening.

EXAM:
DIGITAL SCREENING BILATERAL MAMMOGRAM WITH TOMOSYNTHESIS AND CAD
TECHNIQUE: Bilateral screening digital craniocaudal and mediolateral oblique
mammograms were obtained. Bilateral screening digital breast
tomosynthesis was performed. The images were evaluated with
computer-aided detection.

[L CC synth-2D]
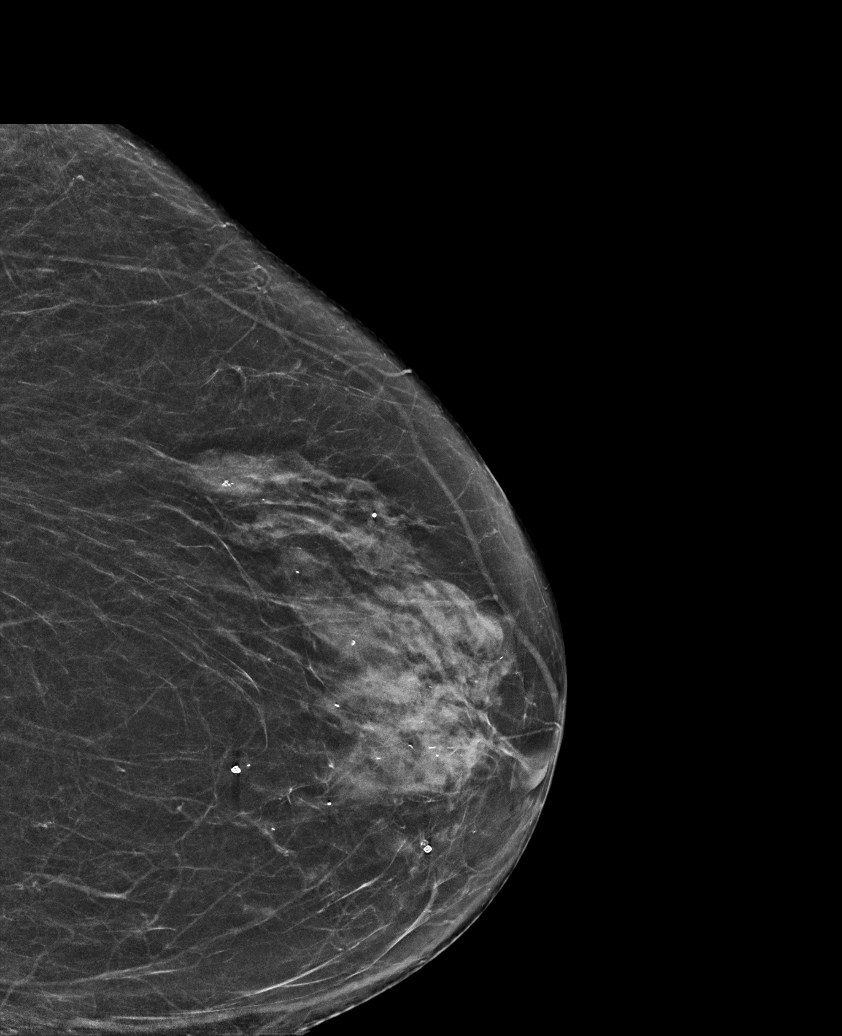

[L CV synth-2D]
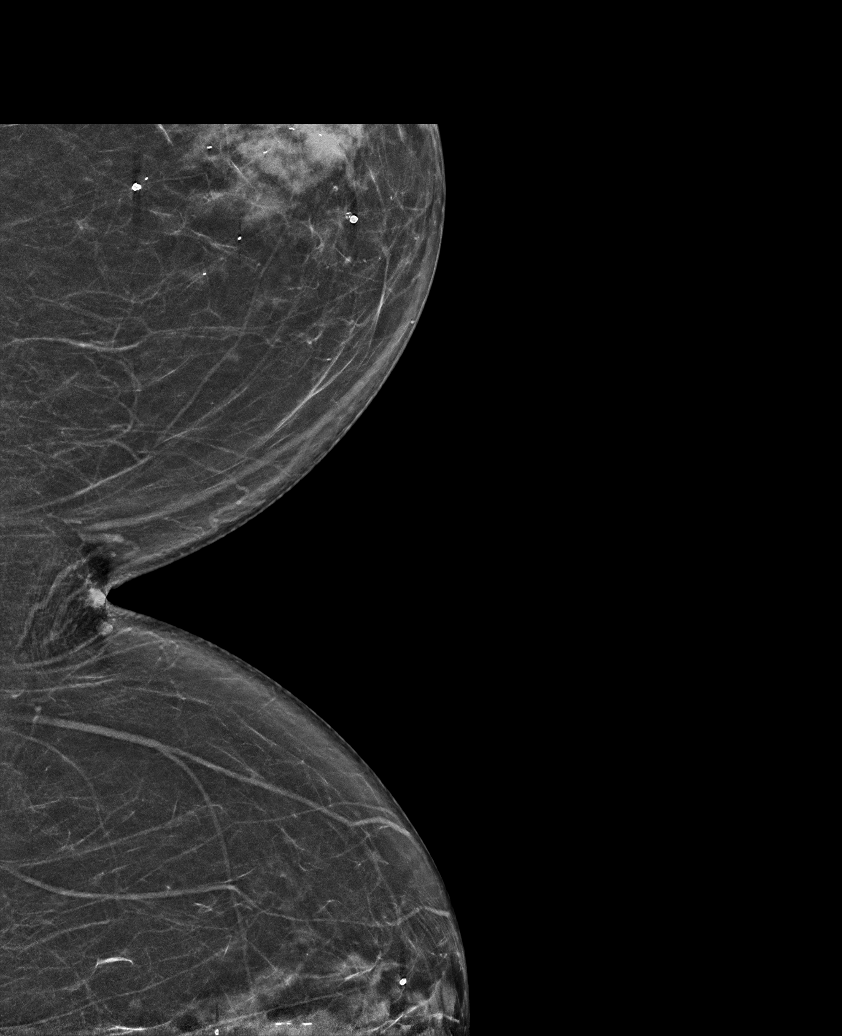

[R MLO synth-2D (1 of 2)]
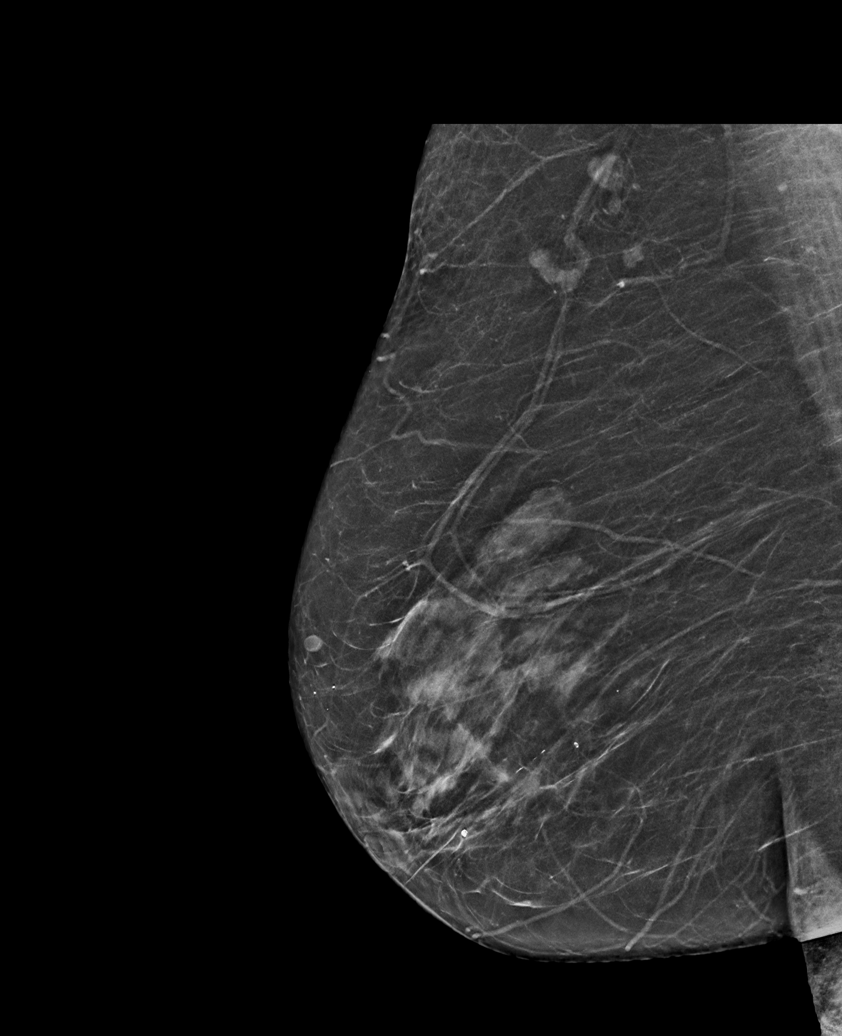

[L MLO synth-2D]
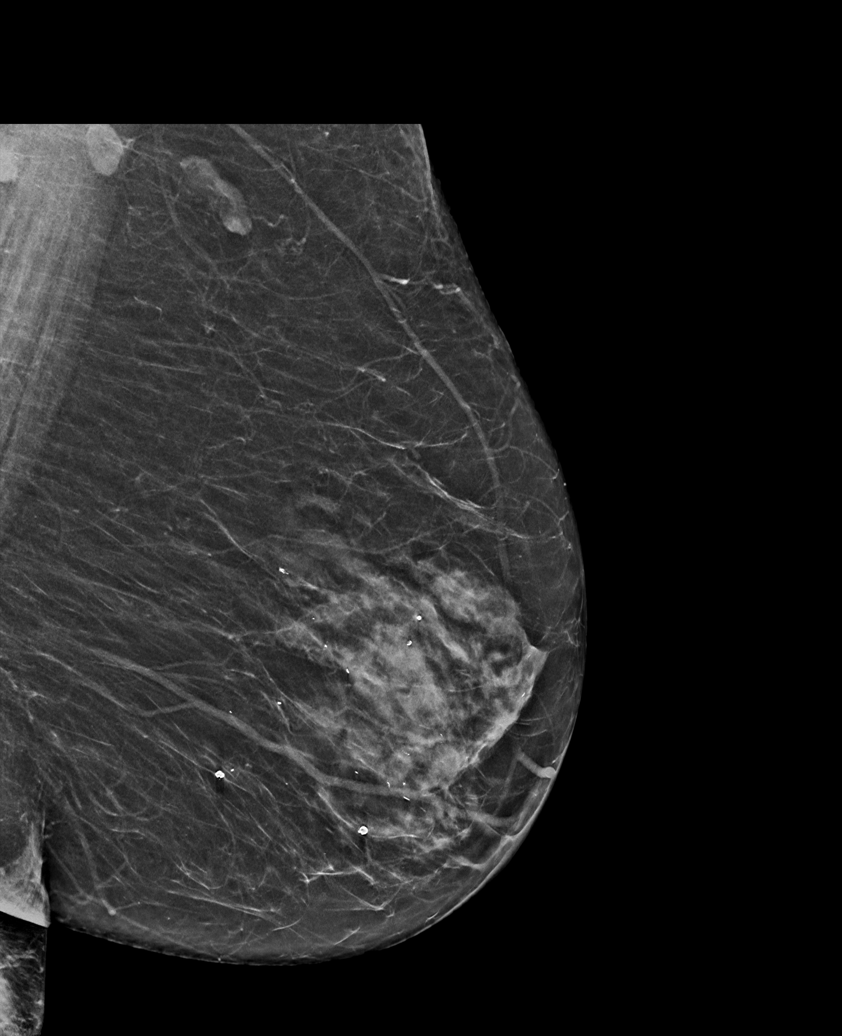

[R MLO synth-2D (2 of 2)]
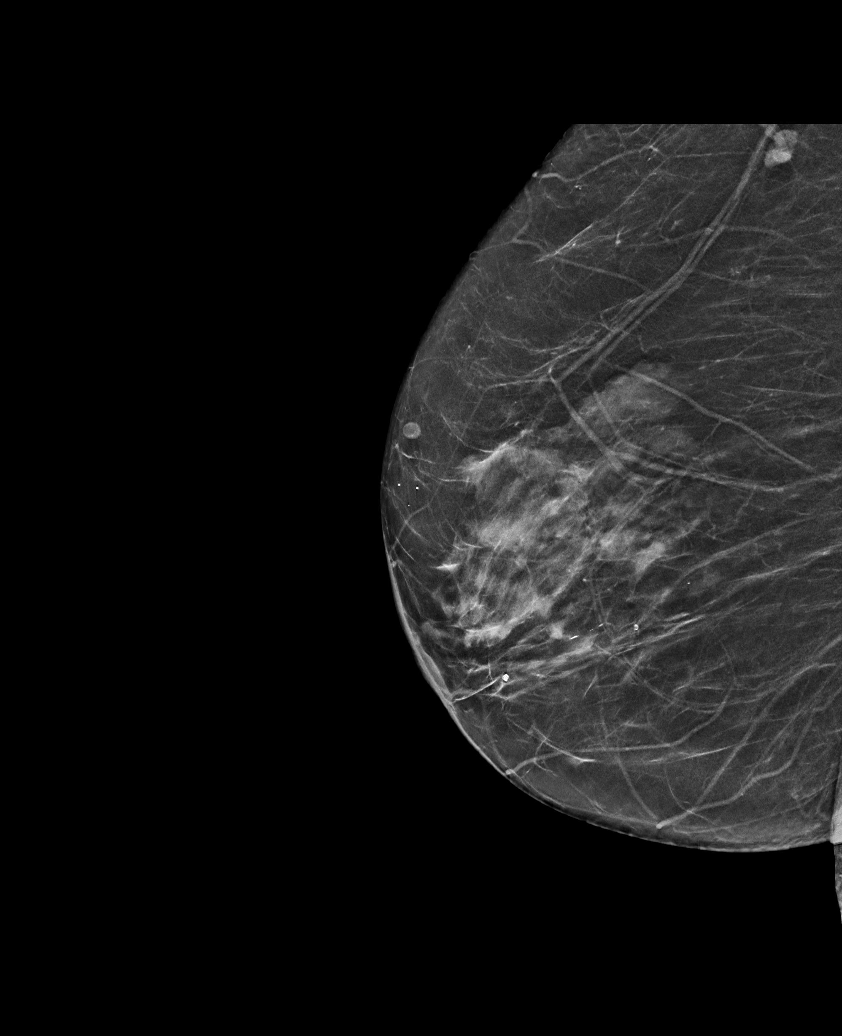

[R CC synth-2D]
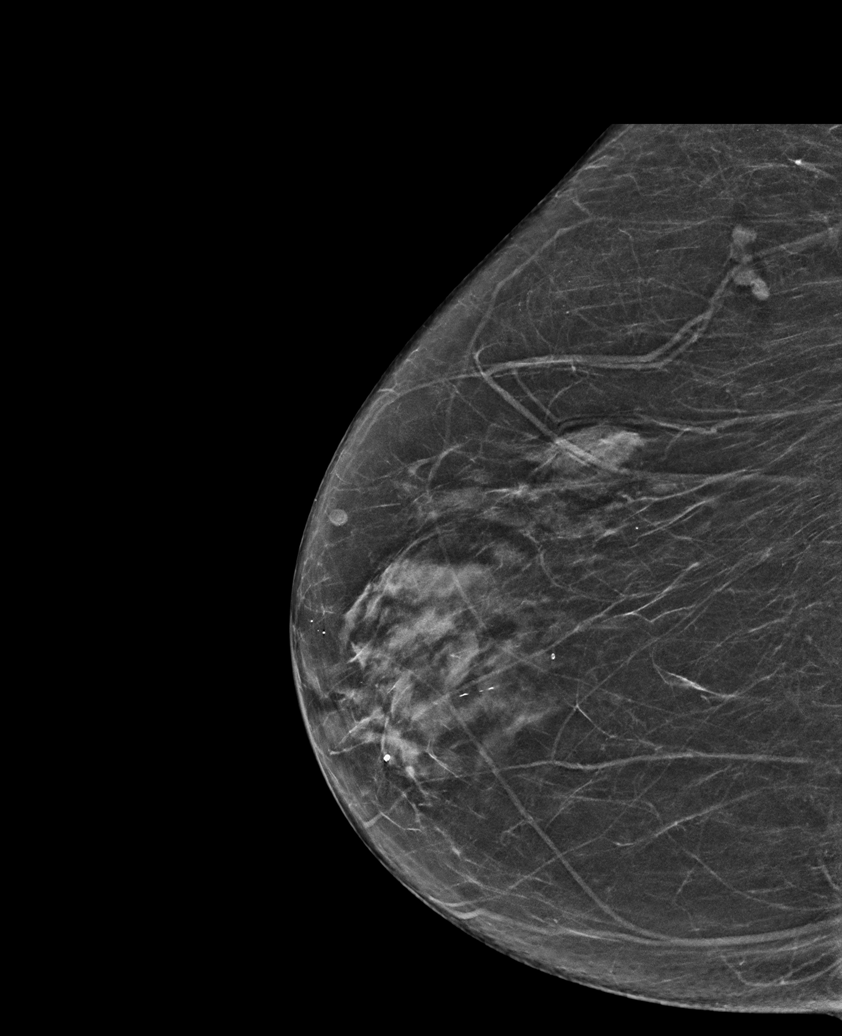

[6 of 36 positions shown; findings below may reference images not displayed]

ACR Breast Density Category b: There are scattered areas of
fibroglandular density.
FINDINGS: There are no findings suspicious for malignancy.
IMPRESSION: No mammographic evidence of malignancy. A result letter of this
screening mammogram will be mailed directly to the patient.

RECOMMENDATION:
Screening mammogram in one year. (Code:[BY])

BI-RADS CATEGORY  1: Negative.

## 2020-04-04 ENCOUNTER — Ambulatory Visit: Admit: 2020-04-04 | Payer: Medicare HMO | Admitting: Ophthalmology

## 2020-04-04 SURGERY — PHACOEMULSIFICATION, CATARACT, WITH IOL INSERTION
Anesthesia: Topical | Laterality: Left

## 2020-04-18 ENCOUNTER — Ambulatory Visit: Admit: 2020-04-18 | Payer: Medicare HMO | Admitting: Ophthalmology

## 2020-04-18 SURGERY — PHACOEMULSIFICATION, CATARACT, WITH IOL INSERTION
Anesthesia: Topical | Laterality: Right

## 2020-04-20 ENCOUNTER — Other Ambulatory Visit: Payer: Self-pay | Admitting: Internal Medicine

## 2020-04-21 ENCOUNTER — Other Ambulatory Visit: Payer: Self-pay | Admitting: Internal Medicine

## 2020-04-21 NOTE — Telephone Encounter (Signed)
Pharmacy comment: Alternative Requested:MED ON BACKORDER.

## 2020-04-24 ENCOUNTER — Other Ambulatory Visit: Payer: Self-pay | Admitting: Internal Medicine

## 2020-04-25 MED ORDER — LOSARTAN POTASSIUM 50 MG PO TABS: 100.0000 mg | ORAL_TABLET | Freq: Every day | ORAL | 0 refills | Status: DC

## 2020-05-03 DIAGNOSIS — L820 Inflamed seborrheic keratosis: Secondary | ICD-10-CM | POA: Diagnosis not present

## 2020-05-09 DIAGNOSIS — H52213 Irregular astigmatism, bilateral: Secondary | ICD-10-CM | POA: Diagnosis not present

## 2020-05-09 DIAGNOSIS — H25813 Combined forms of age-related cataract, bilateral: Secondary | ICD-10-CM | POA: Diagnosis not present

## 2020-05-15 ENCOUNTER — Telehealth: Payer: Self-pay | Admitting: Internal Medicine

## 2020-05-15 ENCOUNTER — Ambulatory Visit: Payer: Medicare HMO | Admitting: Dermatology

## 2020-05-15 ENCOUNTER — Telehealth: Payer: Self-pay

## 2020-05-15 MED ORDER — LOSARTAN POTASSIUM 100 MG PO TABS: 100.0000 mg | ORAL_TABLET | Freq: Every day | ORAL | 1 refills | Status: DC

## 2020-05-15 NOTE — Telephone Encounter (Signed)
Will discuss at upcoming appt.

## 2020-05-15 NOTE — Telephone Encounter (Signed)
Per appt notes pt already has appt 05/16/20 at 3:15 with Avie Echevaria NP.

## 2020-05-15 NOTE — Telephone Encounter (Signed)
Pt called in wanted to know if she can a prescription for the losartan 100mg   , she stated that the pharmacy didn't have the 100mg  at the time at, they had 50mg  but now they have the 100mg .   The [facility] is [facility]- [address]

## 2020-05-15 NOTE — Telephone Encounter (Signed)
[facility] - Client TELEPHONE ADVICE RECORD [facility] Patient Name: Lacey Brown Gender: Female DOB: 08-28-1940 Age: 80 Y 48 M 25 D Return Phone Number: [phone] (Primary) Address: City/ State/ Zip: [address] [place] [address] Client [place] [facility] - Client Client Site [place] - Day Physician Webb Silversmith - NP Contact Type Call Who Is Calling Patient / Member / Family / Caregiver Call Type Triage / Clinical Relationship To Patient Self Return Phone Number [phone] (Primary) Chief Complaint Dizziness Reason for Call Symptomatic / Request for [place] states, pt has dizzy spells for days. Translation No Nurse Assessment Nurse: [place], RN, Estill Bamberg Date/Time (Eastern Time): 05/15/2020 10:53:28 AM Confirm and document reason for call. If symptomatic, describe symptoms. ---caller states she has had some dizziness yesterday some and again this morning. unusual for her. Does the patient have any new or worsening symptoms? ---Yes Will a triage be completed? ---Yes Related visit to physician within the last 2 weeks? ---No Does the PT have any chronic conditions? (i.e. diabetes, asthma, this includes High risk factors for pregnancy, etc.) ---Yes List chronic conditions. ---HTN on metoprolol, losartan Is this a behavioral health or substance abuse call? ---No Guidelines Guideline Title Affirmed Question Affirmed Notes Nurse Date/Time (Eastern Time) Dizziness - Lightheadedness Taking a medicine that could cause dizziness (e.g., blood pressure medications, diuretics) [place], RN, Estill Bamberg 05/15/2020 10:55:00 AM Disp. Time Eilene Ghazi Time) Disposition Final User 05/15/2020 10:58:45 AM See PCP within 24 Hours Yes [place], RN, Estill Bamberg PLEASE NOTE: All timestamps contained within this report are represented as [place] Standard Time. CONFIDENTIALTY NOTICE:  This fax transmission is intended only for the addressee. It contains information that is legally privileged, confidential or otherwise protected from use or disclosure. If you are not the intended recipient, you are strictly prohibited from reviewing, disclosing, copying using or disseminating any of this information or taking any action in reliance on or regarding this information. If you have received this fax in error, please notify us immediately by telephone so that we can arrange for its return to [place]. Phone: [phone], Toll-Free: [phone], Fax: [phone] Page: 2 of 2 Call Id: [id number] [place] Disagree/Comply Comply Caller Understands Yes PreDisposition Did not know what to do Care Advice Given Per Guideline SEE PCP WITHIN 24 HOURS: * IF OFFICE WILL BE OPEN: You need to be examined within the next 24 hours. Call your doctor (or NP/PA) when the office opens and make an appointment. DRINK FLUIDS: * Drink several glasses of fruit juice, other clear fluids or water. CARE ADVICE given per Dizziness (Adult) guideline. * You become worse * Passes out (faints) CALL BACK IF: Referrals Warm transfer to backline

## 2020-05-15 NOTE — Telephone Encounter (Signed)
Rx sent through e-scribe  

## 2020-05-16 ENCOUNTER — Other Ambulatory Visit: Payer: Self-pay

## 2020-05-16 ENCOUNTER — Ambulatory Visit (INDEPENDENT_AMBULATORY_CARE_PROVIDER_SITE_OTHER): Payer: Medicare HMO | Admitting: Internal Medicine

## 2020-05-16 ENCOUNTER — Encounter: Payer: Self-pay | Admitting: Internal Medicine

## 2020-05-16 VITALS — BP 152/82 | HR 70 | Temp 97.2°F | Wt 172.0 lb

## 2020-05-16 DIAGNOSIS — R42 Dizziness and giddiness: Secondary | ICD-10-CM | POA: Diagnosis not present

## 2020-05-16 DIAGNOSIS — H6122 Impacted cerumen, left ear: Secondary | ICD-10-CM

## 2020-05-16 NOTE — Progress Notes (Signed)
Subjective:    Patient ID: Lacey Brown, female    DOB: 05-Jul-1940, 80 y.o.   MRN: [id number]  HPI  Pt presents to the clinic today with c/o dizziness. She reports this started 2-3 days ago. She reports it is intermittent, more a sensation of lightheadedness versus the room spinning, and typically occurs with movement. She denies visual changes, near syncope, chest pain or shortness of breath. She denies URI symptoms. She has tried Flonase OTC with some relief of symptoms.   Review of Systems  Past Medical History:  Diagnosis Date  . Arthritis    back, neck; right shoulder;   . Asthma    using advair prn  . Chicken pox   . Hyperlipidemia   . Hypertension    controlled with medication;     Current Outpatient Medications  Medication Sig Dispense Refill  . Cholecalciferol (VITAMIN D3) 2000 UNITS TABS Take 1 tablet by mouth daily.    . Fluticasone-Salmeterol (ADVAIR DISKUS) 250-50 MCG/DOSE AEPB TAKE 1 PUFF BY MOUTH TWICE A DAY **RINSE MOUTH AFTER USE** 180 each 2  . ibuprofen (ADVIL,MOTRIN) 400 MG tablet Take 400 mg by mouth daily as needed.     Marland Kitchen losartan (COZAAR) 100 MG tablet Take 1 tablet (100 mg total) by mouth daily. 90 tablet 1  . metoprolol succinate (TOPROL-XL) 25 MG 24 hr tablet TAKE 1 TABLET BY MOUTH EVERY DAY 90 tablet 0  . Multiple Minerals-Vitamins (CALCIUM CITRATE PLUS PO) Take 1 tablet by mouth daily.    . Omega-3 Fatty Acids (FISH OIL) 1000 MG CAPS Take 1 capsule by mouth daily.    Marland Kitchen omeprazole (PRILOSEC OTC) 20 MG tablet Take 20 mg by mouth as needed.     . prednisoLONE acetate (PRED FORTE) 1 % ophthalmic suspension     . valsartan (DIOVAN) 160 MG tablet Take 1 tablet (160 mg total) by mouth daily. 30 tablet 1  . VENTOLIN HFA 108 (90 Base) MCG/ACT inhaler TAKE 2 PUFFS BY MOUTH EVERY 6 HOURS AS NEEDED FOR WHEEZE OR SHORTNESS OF BREATH 1 Inhaler 2  . vitamin C (ASCORBIC ACID) 500 MG tablet Take 1,000 mg by mouth daily as needed.      No current  facility-administered medications for this visit.    Allergies  Allergen Reactions  . Ace Inhibitors Cough    Other reaction(s): Cough  . Codeine Nausea Only    Family History  Problem Relation Age of Onset  . Heart disease Mother   . Heart disease Father   . Arthritis Maternal Grandmother   . Diabetes Neg Hx   . Cancer Neg Hx   . Stroke Neg Hx     Social History   Socioeconomic History  . Marital status: Widowed    Spouse name: Not on file  . Number of children: Not on file  . Years of education: Not on file  . Highest education level: Not on file  Occupational History  . Not on file  Tobacco Use  . Smoking status: Former Smoker    Quit date: 04/17/2009    Years since quitting: 11.0  . Smokeless tobacco: Never Used  Vaping Use  . Vaping Use: Never used  Substance and Sexual Activity  . Alcohol use: Yes    Alcohol/week: 0.0 standard drinks    Comment: occasional  . Drug use: No  . Sexual activity: Never  Other Topics Concern  . Not on file  Social History Narrative  . Not on file   Social  Determinants of Health   Financial Resource Strain: Not on file  Food Insecurity: Not on file  Transportation Needs: Not on file  Physical Activity: Not on file  Stress: Not on file  Social Connections: Not on file  Intimate Partner Violence: Not on file     Constitutional: Denies fever, malaise, fatigue, headache or abrupt weight changes.  HEENT: Denies eye pain, eye redness, ear pain, ringing in the ears, wax buildup, runny nose, nasal congestion, bloody nose, or sore throat. Respiratory: Denies difficulty breathing, shortness of breath, cough or sputum production.   Cardiovascular: Denies chest pain, chest tightness, palpitations or swelling in the hands or feet.  Neurological: Pt reports dizziness. Denies difficulty with memory, difficulty with speech or problems with balance and coordination.    No other specific complaints in a complete review of systems  (except as listed in HPI above).     Objective:   Physical Exam  BP (!) 152/82   Pulse 70   Temp (!) 97.2 F (36.2 C) (Temporal)   Wt 172 lb (78 kg)   SpO2 95%   BMI 34.74 kg/m   Wt Readings from Last 3 Encounters:  12/02/19 173 lb (78.5 kg)  11/17/19 174 lb 6.4 oz (79.1 kg)  08/05/19 174 lb (78.9 kg)    General: Appears her stated age, obese, in NAD. HEENT: Head: normal shape and size; Eyes: sclera white, no icterus, conjunctiva pink, PERRLA and EOMs intact, no nystagmus noted; Ears: slight wax buildup in both ears. Cardiovascular: Normal rate and rhythm. S1,S2 noted.  No murmur, rubs or gallops noted. No JVD or BLE edema. No carotid bruits noted. Pulmonary/Chest: Normal effort and positive vesicular breath sounds. No respiratory distress. No wheezes, rales or ronchi noted.  Neurological: Alert and oriented.  Coordination normal.  Psychiatric: Mood and affect normal. Behavior is normal. Judgment and thought content normal.    BMET    Component Value Date/Time   NA 133 (L) 12/02/2019 1213   K 4.6 12/02/2019 1213   CL 100 12/02/2019 1213   CO2 28 12/02/2019 1213   GLUCOSE 96 12/02/2019 1213   BUN 17 12/02/2019 1213   CREATININE 0.73 12/02/2019 1213   CALCIUM 9.1 12/02/2019 1213    Lipid Panel     Component Value Date/Time   CHOL 179 12/02/2019 1213   TRIG 75.0 12/02/2019 1213   HDL 67.60 12/02/2019 1213   CHOLHDL 3 12/02/2019 1213   VLDL 15.0 12/02/2019 1213   LDLCALC 96 12/02/2019 1213    CBC    Component Value Date/Time   WBC 7.3 12/02/2019 1213   RBC 4.07 12/02/2019 1213   HGB 12.5 12/02/2019 1213   HCT 37.2 12/02/2019 1213   PLT 231.0 12/02/2019 1213   MCV 91.4 12/02/2019 1213   MCHC 33.5 12/02/2019 1213   RDW 13.4 12/02/2019 1213    Hgb A1C No results found for: HGBA1C         Assessment & Plan:   Lightheadedness, Cerumen Impaction on the Left:  Attempted lavage by CMA- unsuccessful She declines referral to ENT Start Flonase  daily Seems c/w BPPV Could try Meclizine OTC as needed for dizziness May need referral to vestibular rehab/neurology- to perform the Epley Maneuver  Return precautions discussed  Webb Silversmith, NP This visit occurred during the SARS-CoV-2 public health emergency.  Safety protocols were in place, including screening questions prior to the visit, additional usage of staff PPE, and extensive cleaning of exam room while observing appropriate contact time as indicated for  disinfecting solutions.

## 2020-05-16 NOTE — Patient Instructions (Signed)

## 2020-05-17 ENCOUNTER — Telehealth: Payer: Self-pay | Admitting: Internal Medicine

## 2020-05-17 NOTE — Telephone Encounter (Signed)
Pt left a gold earring in the room while she was here getting her ear washed out.

## 2020-05-17 NOTE — Telephone Encounter (Signed)
[place] Night - Client Nonclinical Telephone Record  [facility] Client [facility] Night - Client Client Site [place] Physician Webb Silversmith - NP Contact Type Call Who Is Calling Patient / Member / Family / Caregiver Caller Name Lacey Brown Caller Phone Number [phone] Patient Name Lacey Brown Patient DOB Feb 04, 1941 Call Type Message Only Information Provided Reason for Call Request for General Office Information Initial Comment Caller states that she was in the office today, Ear was rinsed out. Left gold earring in the exam room on a chair. Please call PT about earring. Disp. Time Disposition Final User 05/16/2020 7:23:22 PM General Information Provided Yes Lawernce Keas Call Closed By: Lawernce Keas Transaction Date/Time: 05/16/2020 7:18:12 PM (ET)

## 2020-05-17 NOTE — Telephone Encounter (Signed)
Earring was found in exam room. Placed in envelope at front desk for pt pickup.   Advised pt of earring. She will stop by this morning and pick it up. Pt very appreciative.

## 2020-06-07 ENCOUNTER — Telehealth: Payer: Self-pay | Admitting: Internal Medicine

## 2020-06-07 NOTE — Telephone Encounter (Signed)
Lm on vm to call office and schedule TOC with Dr. Derrel Nip. This has been ok'd by Dr Derrel Nip on 06/07/20.

## 2020-06-20 ENCOUNTER — Telehealth: Payer: Self-pay | Admitting: Internal Medicine

## 2020-06-20 DIAGNOSIS — M810 Age-related osteoporosis without current pathological fracture: Secondary | ICD-10-CM

## 2020-06-20 NOTE — Telephone Encounter (Signed)
Mrs. Lacey Brown called in wanted to know about getting a Prolia shot she said she is due in June, and wanted to know how to set it up.

## 2020-06-21 NOTE — Telephone Encounter (Signed)
Noted. Benefits submitted-will advise patient once I have benefit information

## 2020-06-21 NOTE — Telephone Encounter (Signed)
Spoke with patient to let her know that she will be due for Prolia injection after June 28th. Advised patient that since she is going to be establishing with Dr Derrel Nip on 08/01/20 she can discuss with them getting scheduled for the injection. Patient asked if she can go ahead and get Prolia done at our office for the end of June so it does not prolong her from having to wait after getting established at another office and not been able to get 2 shots in the year. Patient stated she is going to Dr Derrel Nip because her PCP left now and she wanted to stay with [facility]. I advised patient that I would ask to see if we can give Prolia this time here still and after she would follow up with Dr Lupita Dawn office.  Previous PCP was [facility]. Dr Glori Bickers are you willing to co sign for patient to have her labs and Prolia done this time here?

## 2020-06-21 NOTE — Telephone Encounter (Signed)
Yes, that sounds fine, thanks

## 2020-06-28 ENCOUNTER — Telehealth: Payer: Self-pay

## 2020-06-28 NOTE — Telephone Encounter (Signed)
Patient calling asking if referral can be placed for her to get spinal injections for her chronic back pain that is bothering her. Patient states she discussed with Webb Silversmith, NP on 12/02/19 this issue but was referred for PT instead. She did not want PT and states the last time she spoke with Rollene Fare about this referral was suppose to have been placed for specialist. I did not see this ben ordered. Patient does not see her new PCP-Dr Tullo-until the end of June, can referral be placed based on the OV note in October 2021 or does patient need to wait to be established with Dr Derrel Nip and discuss then?

## 2020-06-29 NOTE — Telephone Encounter (Signed)
She was referred to physiatry. Not physical therapy. At this point, would recommend she wait an see Dr. Derrel Nip to discuss.

## 2020-06-29 NOTE — Telephone Encounter (Signed)
Noted. Looked in the referral notes and order was put in for Physical Medicine and Rehabilitation, but the referral was sent to [facility] PT Office instead by the referral coordinator-that was incorrect. Will advise patient tomorrow when I call her back in regards to the prolia scheduling

## 2020-06-29 NOTE — Telephone Encounter (Signed)
Per my discussion with Dr. Glori Bickers, we will route this message to Webb Silversmith at her [place] office location to address for patient.

## 2020-06-30 NOTE — Addendum Note (Signed)
Addended by: Kris Mouton on: 06/30/2020 10:06 AM   Modules accepted: Orders

## 2020-06-30 NOTE — Telephone Encounter (Signed)
Benefits received. OOP cost for patient is $285. Need PA done-will work on this. Patient advised. Lab appt made for 07/26/20 and [place] appt on 08/02/20. Patient is aware moving forward she would be getting Prolia through her new PCP.

## 2020-06-30 NOTE — Telephone Encounter (Signed)
Patient advised.

## 2020-07-05 NOTE — Telephone Encounter (Signed)
Authorization request submitted through [employer] by [phone]. Authorization valid 07/05/20-07/05/21. Auth [id number]

## 2020-07-20 ENCOUNTER — Ambulatory Visit (INDEPENDENT_AMBULATORY_CARE_PROVIDER_SITE_OTHER): Payer: Medicare HMO | Admitting: Family Medicine

## 2020-07-20 ENCOUNTER — Other Ambulatory Visit: Payer: Self-pay

## 2020-07-20 ENCOUNTER — Encounter: Payer: Self-pay | Admitting: Family Medicine

## 2020-07-20 VITALS — BP 130/60 | HR 72 | Temp 98.1°F | Ht 59.0 in | Wt 170.2 lb

## 2020-07-20 DIAGNOSIS — M1712 Unilateral primary osteoarthritis, left knee: Secondary | ICD-10-CM

## 2020-07-20 DIAGNOSIS — M549 Dorsalgia, unspecified: Secondary | ICD-10-CM | POA: Diagnosis not present

## 2020-07-20 DIAGNOSIS — G8929 Other chronic pain: Secondary | ICD-10-CM

## 2020-07-20 MED ORDER — MELOXICAM 15 MG PO TABS: 15.0000 mg | ORAL_TABLET | Freq: Every day | ORAL | 2 refills | Status: DC

## 2020-07-20 MED ORDER — TRIAMCINOLONE ACETONIDE 40 MG/ML IJ SUSP
40.0000 mg | Freq: Once | INTRAMUSCULAR | Status: AC
  Administered 2020-07-20: 40 mg via INTRA_ARTICULAR

## 2020-07-20 NOTE — Progress Notes (Signed)
Lacey Branscum T. Nesanel Aguila, MD, [place] at [facility] [place], [address]  Phone: [phone]  FAX: [phone]  Lacey Brown - 80 y.o. female  MRN [id number]  Date of Birth: 17-Nov-1940  Date: 07/20/2020  PCP: Jearld Fenton, NP  Referral: Jearld Fenton, NP  Chief Complaint  Patient presents with   Knee Pain    Left    This visit occurred during the SARS-CoV-2 public health emergency.  Safety protocols were in place, including screening questions prior to the visit, additional usage of staff PPE, and extensive cleaning of exam room while observing appropriate contact time as indicated for disinfecting solutions.   Subjective:   Lacey Brown is a 80 y.o. very pleasant female patient with Body mass index is 34.39 kg/m. who presents with the following:  Acute L knee pain:   Not limiting her all that much . Flared up a few days ago. The past couple of days it has been botherinng her some.  Place in the back - has had some back problems.   Walks some, no limitations.  Outdoor she is still very active. Lives alone.  She has not had any locking up of the joint or functional giving way.  I did review her prior x-rays, and there is some minimal to mild degenerative changes only.  On exam and history, it is the lateral aspect of her knee that is bothering her the most.  She also describes some left-sided low back pain and buttocks pain without radiculopathy, numbness, or tingling.  She has tried some intermittent ibuprofen and intermittent Tylenol with limited success.  Inj l knee  mobic  Review of Systems is noted in the HPI, as appropriate   Objective:   BP 130/60   Pulse 72   Temp 98.1 F (36.7 C) (Temporal)   Ht 4\' 11"  (1.499 m)   Wt 170 lb 4 oz (77.2 kg)   SpO2 100%   BMI 34.39 kg/m    Knee:  L Gait: Normal heel toe pattern ROM: Full extension and flexion of 120. Effusion:  neg Echymosis or edema: Minimal  patellar tendon NT Painful PLICA: neg Patellar grind: negative Medial and lateral patellar facet loading: negative medial and lateral joint lines: Lateral greater than medial joint line tenderness  Mcmurray's neg Flexion-pinch pain Varus and valgus stress: stable Lachman: neg Ant and Post drawer: neg Hip abduction, IR, ER: WNL Hip flexion str: 5/5 Hip abd: 5/5 Quad: 5/5 VMO atrophy:No Hamstring concentric and eccentric: 5/5   Minimal tenderness along the entirety of the paraspinous musculature.  No tenderness at the SI joint.  She does have some minimal tenderness in the buttocks region.  No GTB tenderness.  Straight leg raise is negative.  Lower extremities are entirely neurovascularly intact.  Radiology: CLINICAL DATA:  Left knee pain, no known injury, initial encounter   EXAM: LEFT KNEE - COMPLETE 4+ VIEW   COMPARISON:  None.   FINDINGS: Mild cortical irregularity is noted along the medial aspect of the patella. An undisplaced cortical fracture cannot be totally excluded. No joint effusion is seen. No other fractures are noted.   IMPRESSION: Mild irregularity of the superomedial aspect of the patella as described.     Electronically Signed   By: Inez Catalina M.D.   On: 04/26/2019 16:11  Assessment and Plan:     ICD-10-CM   1. Primary osteoarthritis of left knee  M17.12 triamcinolone acetonide (KENALOG-40) injection  40 mg    2. Chronic back pain greater than 3 months duration  M54.9    G89.29      Knee osteoarthritis with exacerbation.  She also has some chronic back pain, and on my examination this is reasonably benign today.  I encouraged her to be physically active, work on strength and weight control.  We are going to do a trial of some meloxicam to hope that this helps with this on a longer-term basis.  Aspiration/Injection Procedure Note Lacey Brown [birth year] Date of procedure: 07/20/2020  Procedure: Large  Joint Aspiration / Injection of Knee, L Indications: Pain  Procedure Details Patient verbally consented to procedure. Risks, benefits, and alternatives explained. Sterilely prepped with Chloraprep. Ethyl cholride used for anesthesia. 9 cc Lidocaine 1% mixed with 1 mL of Kenalog 40 mg injected using the anteromedial approach without difficulty. No complications with procedure and tolerated well. Patient had decreased pain post-injection. Medication: 1 mL of Kenalog 40 mg   Meds ordered this encounter  Medications   meloxicam (MOBIC) 15 MG tablet    Sig: Take 1 tablet (15 mg total) by mouth daily.    Dispense:  30 tablet    Refill:  2   triamcinolone acetonide (KENALOG-40) injection 40 mg   There are no discontinued medications. No orders of the defined types were placed in this encounter.   Follow-up: No follow-ups on file.  Signed,  Maud Deed. Tip Atienza, MD   Outpatient Encounter Medications as of 07/20/2020  Medication Sig   Cholecalciferol (VITAMIN D3) 2000 UNITS TABS Take 1 tablet by mouth daily.   Fluticasone-Salmeterol (ADVAIR DISKUS) 250-50 MCG/DOSE AEPB TAKE 1 PUFF BY MOUTH TWICE A DAY **RINSE MOUTH AFTER USE**   ibuprofen (ADVIL,MOTRIN) 400 MG tablet Take 400 mg by mouth daily as needed.    losartan (COZAAR) 100 MG tablet Take 1 tablet (100 mg total) by mouth daily.   meloxicam (MOBIC) 15 MG tablet Take 1 tablet (15 mg total) by mouth daily.   metoprolol succinate (TOPROL-XL) 25 MG 24 hr tablet TAKE 1 TABLET BY MOUTH EVERY DAY   Multiple Minerals-Vitamins (CALCIUM CITRATE PLUS PO) Take 1 tablet by mouth daily.   Omega-3 Fatty Acids (FISH OIL) 1000 MG CAPS Take 1 capsule by mouth daily.   omeprazole (PRILOSEC OTC) 20 MG tablet Take 20 mg by mouth as needed.    prednisoLONE acetate (PRED FORTE) 1 % ophthalmic suspension    valsartan (DIOVAN) 160 MG tablet Take 1 tablet (160 mg total) by mouth daily.   VENTOLIN HFA 108 (90 Base) MCG/ACT inhaler TAKE 2 PUFFS BY MOUTH EVERY 6  HOURS AS NEEDED FOR WHEEZE OR SHORTNESS OF BREATH   vitamin C (ASCORBIC ACID) 500 MG tablet Take 1,000 mg by mouth daily as needed.    [EXPIRED] triamcinolone acetonide (KENALOG-40) injection 40 mg    No facility-administered encounter medications on file as of 07/20/2020.

## 2020-07-24 ENCOUNTER — Telehealth: Payer: Self-pay

## 2020-07-24 ENCOUNTER — Other Ambulatory Visit: Payer: Self-pay | Admitting: Internal Medicine

## 2020-07-24 MED ORDER — PREDNISONE 20 MG PO TABS: ORAL_TABLET | ORAL | 0 refills | Status: DC

## 2020-07-24 NOTE — Telephone Encounter (Signed)
Patient saw Dr Lorelei Pont on 07/20/20 for knee and back pain. Patient has been taking Meloxicam as prescribed and states her knee is still hurting with weight bearing. Not much improvement. Patient was not sure if she should try another medication or how long it would take for the injection to help?

## 2020-07-24 NOTE — Telephone Encounter (Signed)
Have her hold the mobic, and I sent her in 10 days of steroids.  If she is still doing significantly poorly then, then we might need to evaluate further.     Meds ordered this encounter  Medications   predniSONE (DELTASONE) 20 MG tablet    Sig: 2 tabs po daily for 5 days, then 1 tab po daily for 5 days    Dispense:  15 tablet    Refill:  0

## 2020-07-24 NOTE — Telephone Encounter (Signed)
Lacey Brown notified as instructed by telephone.  Patient states understanding.

## 2020-07-26 ENCOUNTER — Other Ambulatory Visit: Payer: Self-pay

## 2020-07-26 ENCOUNTER — Other Ambulatory Visit (INDEPENDENT_AMBULATORY_CARE_PROVIDER_SITE_OTHER): Payer: Medicare HMO

## 2020-07-26 DIAGNOSIS — M810 Age-related osteoporosis without current pathological fracture: Secondary | ICD-10-CM | POA: Diagnosis not present

## 2020-07-26 LAB — BASIC METABOLIC PANEL
BUN: 31 mg/dL — ABNORMAL HIGH (ref 6–23)
CO2: 26 mEq/L (ref 19–32)
Calcium: 8.9 mg/dL (ref 8.4–10.5)
Chloride: 97 mEq/L (ref 96–112)
Creatinine, Ser: 0.87 mg/dL (ref 0.40–1.20)
GFR: 62.52 mL/min (ref >60.00)
Glucose, Bld: 100 mg/dL — ABNORMAL HIGH (ref 70–99)
Potassium: 4.3 mEq/L (ref 3.5–5.1)
Sodium: 130 mEq/L — ABNORMAL LOW (ref 135–145)

## 2020-07-31 NOTE — Telephone Encounter (Signed)
CrCl is 61.81 mL/min per guidelines ok to proceed with Prolia injection. Calcium 8.9 normal on 07/26/20

## 2020-08-01 ENCOUNTER — Other Ambulatory Visit: Payer: Self-pay

## 2020-08-01 ENCOUNTER — Encounter: Payer: Self-pay | Admitting: Internal Medicine

## 2020-08-01 ENCOUNTER — Ambulatory Visit (INDEPENDENT_AMBULATORY_CARE_PROVIDER_SITE_OTHER): Payer: Medicare HMO | Admitting: Internal Medicine

## 2020-08-01 VITALS — BP 140/72 | HR 67 | Temp 96.3°F | Resp 15 | Ht 59.0 in | Wt 169.6 lb

## 2020-08-01 DIAGNOSIS — I700 Atherosclerosis of aorta: Secondary | ICD-10-CM | POA: Diagnosis not present

## 2020-08-01 DIAGNOSIS — M810 Age-related osteoporosis without current pathological fracture: Secondary | ICD-10-CM

## 2020-08-01 DIAGNOSIS — Z23 Encounter for immunization: Secondary | ICD-10-CM | POA: Diagnosis not present

## 2020-08-01 DIAGNOSIS — M170 Bilateral primary osteoarthritis of knee: Secondary | ICD-10-CM | POA: Diagnosis not present

## 2020-08-01 DIAGNOSIS — I10 Essential (primary) hypertension: Secondary | ICD-10-CM | POA: Diagnosis not present

## 2020-08-01 MED ORDER — ALBUTEROL SULFATE HFA 108 (90 BASE) MCG/ACT IN AERS: 2.0000 | INHALATION_SPRAY | Freq: Four times a day (QID) | RESPIRATORY_TRACT | 2 refills | Status: DC | PRN

## 2020-08-01 NOTE — Patient Instructions (Addendum)
For your back pain:  You an use ibuprofen instead of advil or motrin  to save you $$$ (same) .  They are all 200 mg each tablet   You can Use 2 ibuprofen every 6 hours if needed   You can add up to 2000 mg of acetominophen (tylenol) every day safely  In divided doses (500 mg every 6 hours  Or 1000 mg every 12 hours.)   If your back pain is not controlled with this ,  let me know.   Regarding your blood pressure:  We will recheck your blood pressure in 3 months at the office. Please purchase a blood pressure machine and start checking the bp once a week .  Bring those readings to the office with you    I am checking your COVID antibody level
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
## 2020-08-02 ENCOUNTER — Other Ambulatory Visit: Payer: Self-pay

## 2020-08-02 ENCOUNTER — Ambulatory Visit (INDEPENDENT_AMBULATORY_CARE_PROVIDER_SITE_OTHER): Payer: Medicare HMO | Admitting: *Deleted

## 2020-08-02 DIAGNOSIS — I700 Atherosclerosis of aorta: Secondary | ICD-10-CM | POA: Insufficient documentation

## 2020-08-02 DIAGNOSIS — M170 Bilateral primary osteoarthritis of knee: Secondary | ICD-10-CM | POA: Insufficient documentation

## 2020-08-02 DIAGNOSIS — M810 Age-related osteoporosis without current pathological fracture: Secondary | ICD-10-CM | POA: Insufficient documentation

## 2020-08-02 LAB — LIPID PANEL
Cholesterol: 203 mg/dL — ABNORMAL HIGH (ref 0–200)
HDL: 83.4 mg/dL (ref >39.00)
LDL Cholesterol: 108 mg/dL — ABNORMAL HIGH (ref 0–99)
NonHDL: 119.87
Total CHOL/HDL Ratio: 2
Triglycerides: 58 mg/dL (ref 0.0–149.0)
VLDL: 11.6 mg/dL (ref 0.0–40.0)

## 2020-08-02 LAB — COMPREHENSIVE METABOLIC PANEL
ALT: 12 U/L (ref 0–35)
AST: 12 U/L (ref 0–37)
Albumin: 4.2 g/dL (ref 3.5–5.2)
Alkaline Phosphatase: 37 U/L — ABNORMAL LOW (ref 39–117)
BUN: 23 mg/dL (ref 6–23)
CO2: 28 mEq/L (ref 19–32)
Calcium: 9.1 mg/dL (ref 8.4–10.5)
Chloride: 96 mEq/L (ref 96–112)
Creatinine, Ser: 1.09 mg/dL (ref 0.40–1.20)
GFR: 47.69 mL/min — ABNORMAL LOW (ref >60.00)
Glucose, Bld: 110 mg/dL — ABNORMAL HIGH (ref 70–99)
Potassium: 5.3 mEq/L — ABNORMAL HIGH (ref 3.5–5.1)
Sodium: 130 mEq/L — ABNORMAL LOW (ref 135–145)
Total Bilirubin: 0.4 mg/dL (ref 0.2–1.2)
Total Protein: 6.3 g/dL (ref 6.0–8.3)

## 2020-08-02 MED ORDER — DENOSUMAB 60 MG/ML ~~LOC~~ SOSY
60.0000 mg | PREFILLED_SYRINGE | Freq: Once | SUBCUTANEOUS | Status: AC
  Administered 2020-08-02: 60 mg via SUBCUTANEOUS

## 2020-08-02 NOTE — Assessment & Plan Note (Signed)
Managed with ibuprofen .  Advised to reduce use of NSAIDS to prn and maximize use of acetominophen  To 2000 mg daily

## 2020-08-02 NOTE — Progress Notes (Signed)
Per orders of Dr. Glori Bickers injection of Prolia given by Lauralyn Primes. Patient tolerated injection well.

## 2020-08-02 NOTE — Assessment & Plan Note (Signed)
she reports compliance with medication regimen  but has an elevated reading today in office.  She is  using NSAIDs daily.  Discussed goal of 120/70  (130/80 for patients over 70)  to preserve renal function.  She has been asked to check her  BP  at home and  submit readings for evaluation. Renal function, electrolytes and screen for proteinuria are all normal .

## 2020-08-02 NOTE — Assessment & Plan Note (Addendum)
Diagnosed in 2017.  Managed with Prolia for the past 3 years.  With improvement noted by recent DEXA ,  Next dose is scheduled for Wednesday June 29

## 2020-08-02 NOTE — Assessment & Plan Note (Signed)
Aortic atherosclerosis :  Discussed the role of statin therapy in management of documented  atherosclerosis of the aorta noted on recent  CT of abdomen and  pelvis and the prognostic implications of this finding.    Lab Results  Component Value Date   CHOL 203 (H) 08/01/2020   HDL 83.40 08/01/2020   LDLCALC 108 (H) 08/01/2020   TRIG 58.0 08/01/2020   CHOLHDL 2 08/01/2020

## 2020-08-03 LAB — SARS-COV-2 SEMI-QUANTITATIVE TOTAL ANTIBODY, SPIKE: SARS COV2 AB, Total Spike Semi QN: >2500 U/mL — ABNORMAL HIGH (ref <0.8)

## 2020-08-04 ENCOUNTER — Telehealth: Payer: Self-pay

## 2020-08-04 NOTE — Telephone Encounter (Signed)
Patient returned your call regarding lab results. 

## 2020-08-04 NOTE — Telephone Encounter (Signed)
See result note message 

## 2020-08-04 NOTE — Telephone Encounter (Signed)
LMTCB in regards to lab results.  

## 2020-08-08 ENCOUNTER — Telehealth: Payer: Self-pay | Admitting: Internal Medicine

## 2020-08-08 ENCOUNTER — Other Ambulatory Visit: Payer: Self-pay | Admitting: *Deleted

## 2020-08-08 DIAGNOSIS — I10 Essential (primary) hypertension: Secondary | ICD-10-CM

## 2020-08-08 NOTE — Telephone Encounter (Signed)
LMTCB

## 2020-08-08 NOTE — Telephone Encounter (Signed)
Opened in Error.

## 2020-08-08 NOTE — Telephone Encounter (Signed)
Pt returned your call.  

## 2020-08-08 NOTE — Telephone Encounter (Signed)
PT called to return message about labs

## 2020-08-08 NOTE — Telephone Encounter (Signed)
PT called to return the missed call about labs.

## 2020-08-09 ENCOUNTER — Other Ambulatory Visit: Payer: Self-pay

## 2020-08-09 ENCOUNTER — Other Ambulatory Visit (INDEPENDENT_AMBULATORY_CARE_PROVIDER_SITE_OTHER): Payer: Medicare HMO

## 2020-08-09 DIAGNOSIS — I10 Essential (primary) hypertension: Secondary | ICD-10-CM

## 2020-08-09 LAB — COMPREHENSIVE METABOLIC PANEL
ALT: 13 U/L (ref 0–35)
AST: 13 U/L (ref 0–37)
Albumin: 3.8 g/dL (ref 3.5–5.2)
Alkaline Phosphatase: 42 U/L (ref 39–117)
BUN: 14 mg/dL (ref 6–23)
CO2: 26 mEq/L (ref 19–32)
Calcium: 8.4 mg/dL (ref 8.4–10.5)
Chloride: 97 mEq/L (ref 96–112)
Creatinine, Ser: 0.73 mg/dL (ref 0.40–1.20)
GFR: 77.15 mL/min (ref >60.00)
Glucose, Bld: 97 mg/dL (ref 70–99)
Potassium: 4.5 mEq/L (ref 3.5–5.1)
Sodium: 128 mEq/L — ABNORMAL LOW (ref 135–145)
Total Bilirubin: 0.3 mg/dL (ref 0.2–1.2)
Total Protein: 5.9 g/dL — ABNORMAL LOW (ref 6.0–8.3)

## 2020-08-17 NOTE — Telephone Encounter (Signed)
See lab note dated 08/11/20

## 2020-08-29 ENCOUNTER — Ambulatory Visit: Payer: Medicare HMO | Admitting: Dermatology

## 2020-10-04 ENCOUNTER — Telehealth: Payer: Self-pay | Admitting: Family Medicine

## 2020-10-04 NOTE — Telephone Encounter (Signed)
Lacey Brown notified as instructed by telephone.  She states she has not been taking the Meloxicam.  She has been using ibuprofen and tylenol but the last copy of days her upper legs/hips have been bothering her. I recommended that she restart Meloxicam and stop the ibuprofen for the next couple of days to see if it helps with her upper legs.  If no improvement with the Meloxicam, she will probably need to be evaluated.  Patient states understanding.

## 2020-10-04 NOTE — Telephone Encounter (Signed)
If it helps with her arthritis, then yes - ok to take it.  Or only take is as needed on bad days

## 2020-10-04 NOTE — Telephone Encounter (Signed)
Pt wanted to know if she still needed to be taking the meloxicam (MOBIC) 15 MG tablet

## 2020-10-05 ENCOUNTER — Ambulatory Visit: Payer: Medicare HMO

## 2020-10-06 DIAGNOSIS — K5900 Constipation, unspecified: Secondary | ICD-10-CM | POA: Diagnosis not present

## 2020-10-06 DIAGNOSIS — G8929 Other chronic pain: Secondary | ICD-10-CM | POA: Diagnosis not present

## 2020-10-06 DIAGNOSIS — I10 Essential (primary) hypertension: Secondary | ICD-10-CM | POA: Diagnosis not present

## 2020-10-06 DIAGNOSIS — M1990 Unspecified osteoarthritis, unspecified site: Secondary | ICD-10-CM | POA: Diagnosis not present

## 2020-10-06 DIAGNOSIS — Z7722 Contact with and (suspected) exposure to environmental tobacco smoke (acute) (chronic): Secondary | ICD-10-CM | POA: Diagnosis not present

## 2020-10-06 DIAGNOSIS — M810 Age-related osteoporosis without current pathological fracture: Secondary | ICD-10-CM | POA: Diagnosis not present

## 2020-10-06 DIAGNOSIS — K219 Gastro-esophageal reflux disease without esophagitis: Secondary | ICD-10-CM | POA: Diagnosis not present

## 2020-10-06 DIAGNOSIS — Z6835 Body mass index (BMI) 35.0-35.9, adult: Secondary | ICD-10-CM | POA: Diagnosis not present

## 2020-10-06 DIAGNOSIS — J45909 Unspecified asthma, uncomplicated: Secondary | ICD-10-CM | POA: Diagnosis not present

## 2020-10-18 DIAGNOSIS — M47816 Spondylosis without myelopathy or radiculopathy, lumbar region: Secondary | ICD-10-CM | POA: Diagnosis not present

## 2020-10-18 DIAGNOSIS — M5442 Lumbago with sciatica, left side: Secondary | ICD-10-CM | POA: Diagnosis not present

## 2020-10-18 DIAGNOSIS — G8929 Other chronic pain: Secondary | ICD-10-CM | POA: Diagnosis not present

## 2020-10-18 DIAGNOSIS — M5441 Lumbago with sciatica, right side: Secondary | ICD-10-CM | POA: Diagnosis not present

## 2020-10-18 DIAGNOSIS — M7918 Myalgia, other site: Secondary | ICD-10-CM | POA: Diagnosis not present

## 2020-10-18 DIAGNOSIS — M5137 Other intervertebral disc degeneration, lumbosacral region: Secondary | ICD-10-CM | POA: Diagnosis not present

## 2020-10-30 ENCOUNTER — Other Ambulatory Visit: Payer: Self-pay

## 2020-10-30 ENCOUNTER — Ambulatory Visit (INDEPENDENT_AMBULATORY_CARE_PROVIDER_SITE_OTHER)
Admission: RE | Admit: 2020-10-30 | Discharge: 2020-10-30 | Disposition: A | Discharge status: Home / Self Care | Payer: Medicare HMO | Source: Ambulatory Visit | Attending: Family Medicine | Admitting: Family Medicine

## 2020-10-30 ENCOUNTER — Encounter: Payer: Self-pay | Admitting: Family Medicine

## 2020-10-30 ENCOUNTER — Ambulatory Visit (INDEPENDENT_AMBULATORY_CARE_PROVIDER_SITE_OTHER): Payer: Medicare HMO | Admitting: Family Medicine

## 2020-10-30 VITALS — BP 140/86 | HR 81 | Temp 97.7°F | Ht 59.0 in | Wt 168.5 lb

## 2020-10-30 DIAGNOSIS — M1712 Unilateral primary osteoarthritis, left knee: Secondary | ICD-10-CM

## 2020-10-30 DIAGNOSIS — M25562 Pain in left knee: Secondary | ICD-10-CM | POA: Diagnosis not present

## 2020-10-30 IMAGING — DX DG KNEE COMPLETE 4+V*L*
4 series · 4 of 4 positions shown · non-contrast
Comparison: X-ray left tibia fibula [DATE]

CLINICAL DATA: Follow-up knee pain, AO chest

EXAM:
LEFT KNEE - COMPLETE 4+ VIEW

[knee ap]
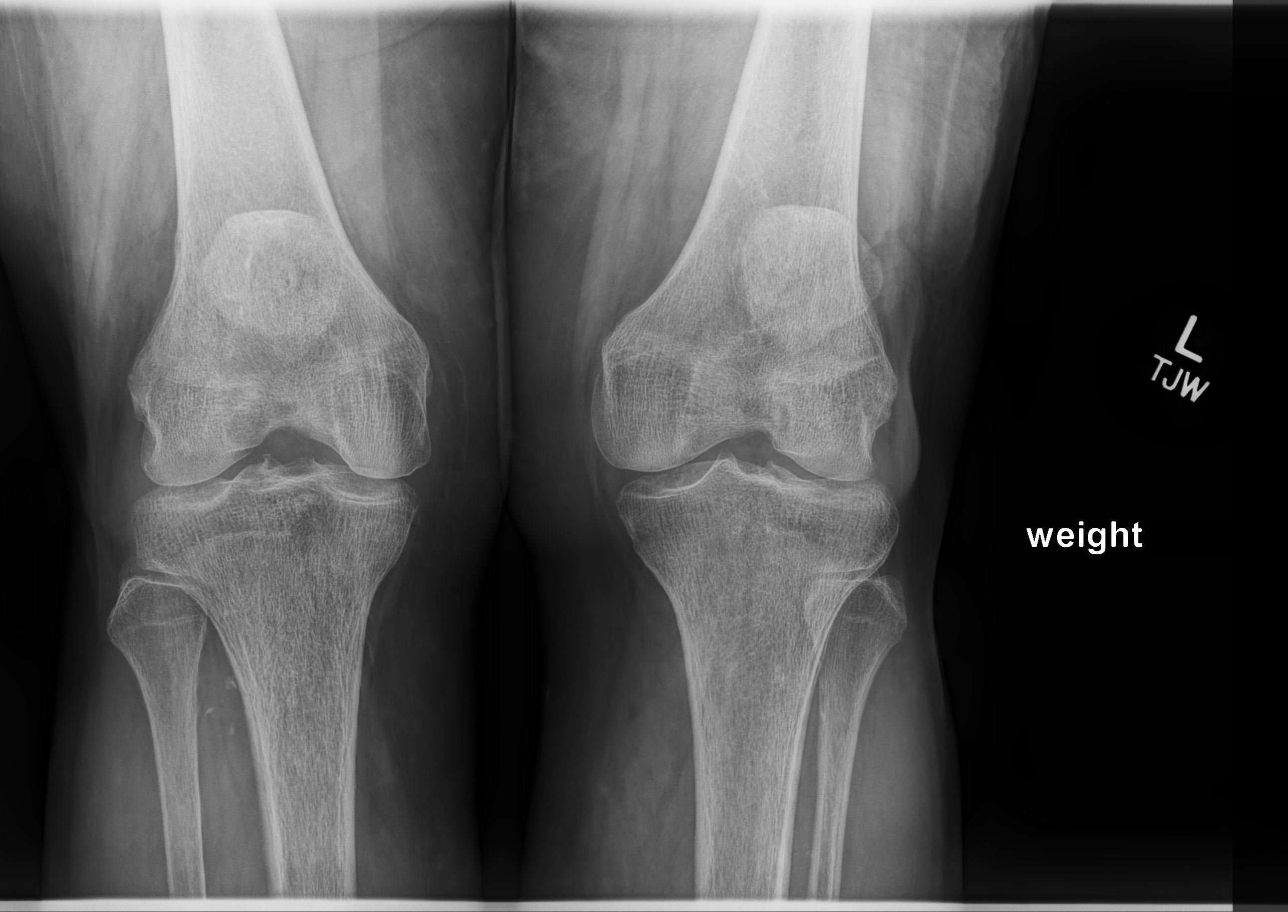

[knee [person_name] view pa]
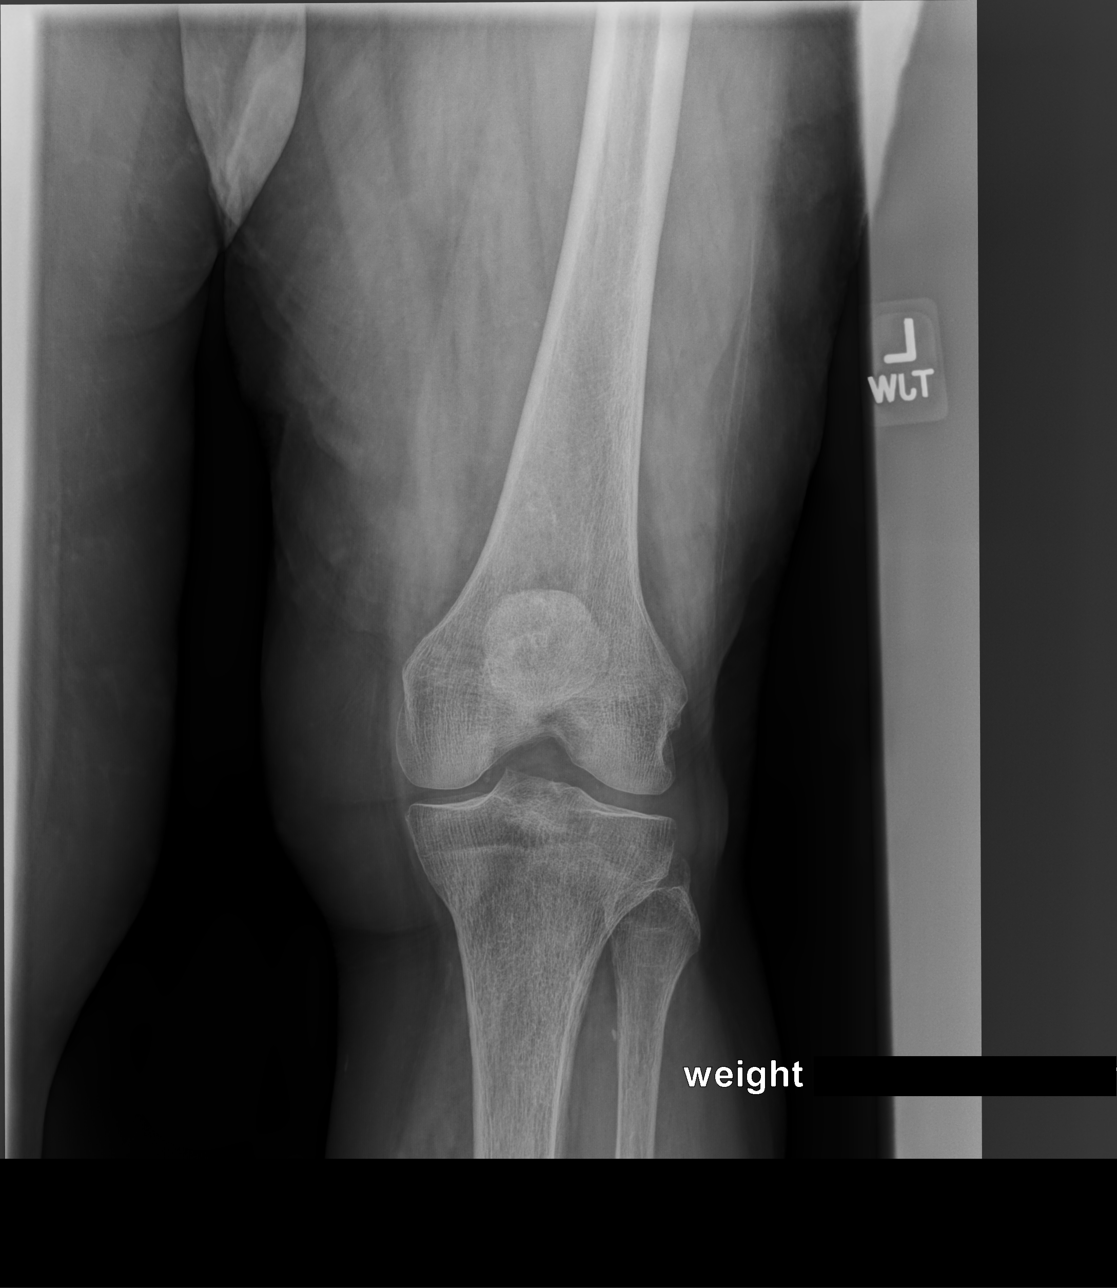

[knee lat]
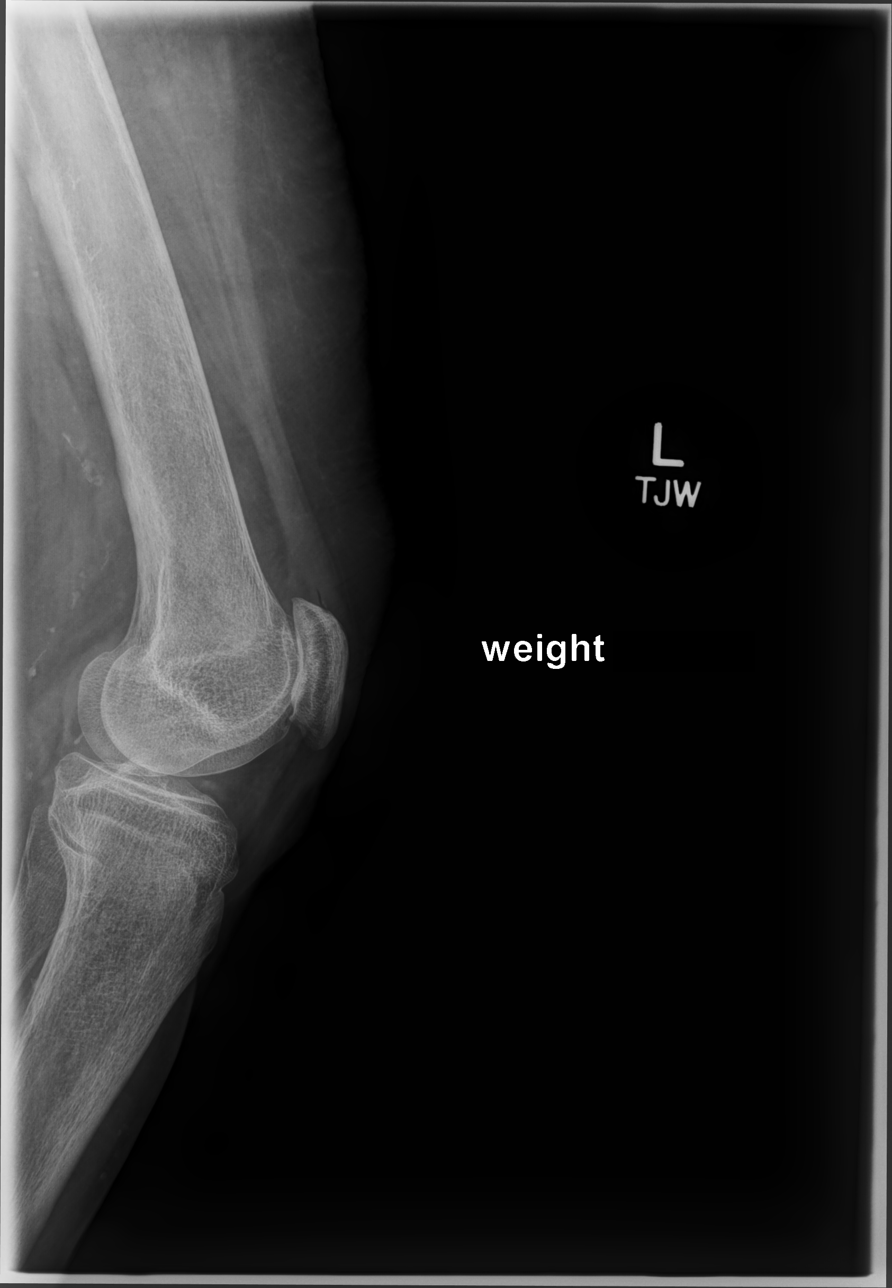

[patella (sunrise)]
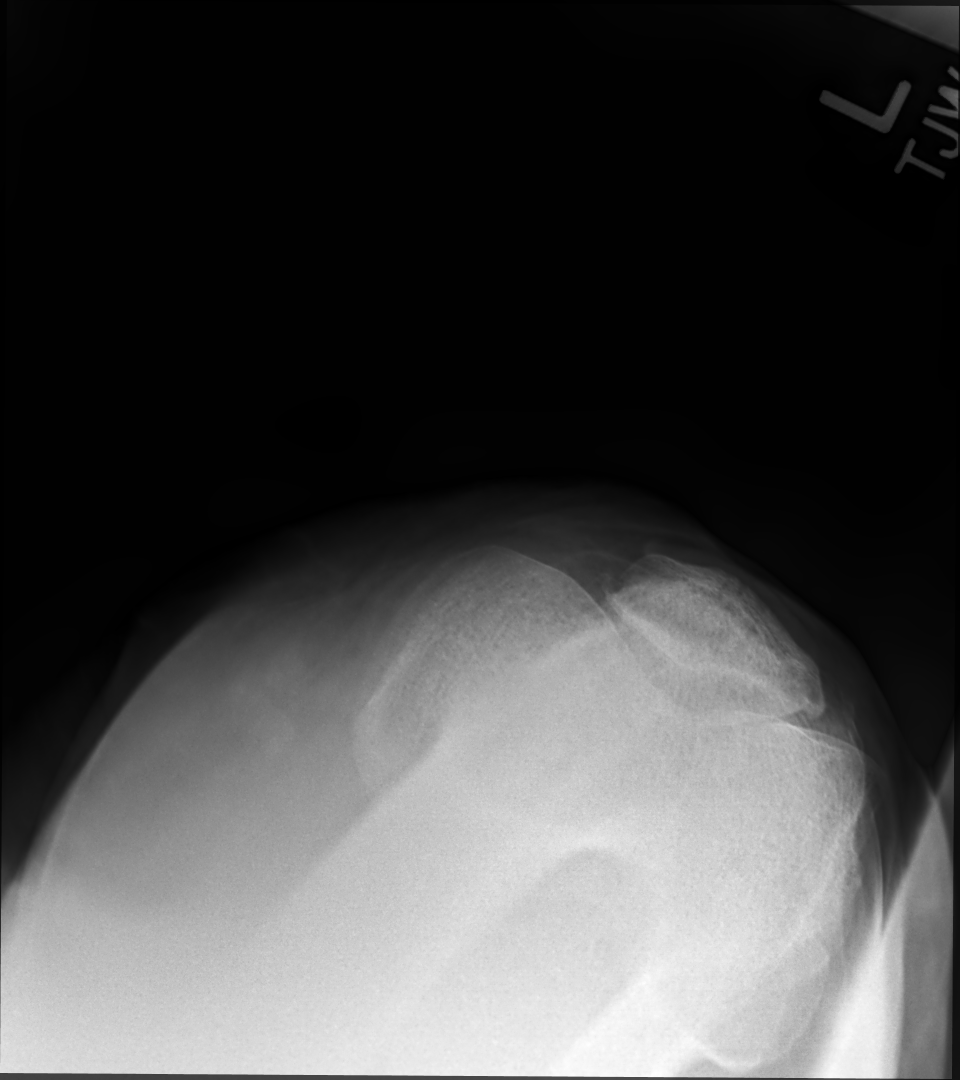

[4 of 4 positions shown; findings below may reference images not displayed]

FINDINGS: No evidence of fracture, dislocation, or joint effusion. Mild medial
tibiofemoral and patellofemoral compartment joint space narrowing.
No evidence of severe arthropathy. No aggressive appearing focal
bone abnormality. Soft tissues are unremarkable. Vascular
calcifications.
IMPRESSION: No acute displaced fracture or dislocation.

## 2020-10-30 MED ORDER — TRIAMCINOLONE ACETONIDE 40 MG/ML IJ SUSP
40.0000 mg | Freq: Once | INTRAMUSCULAR | Status: AC
  Administered 2020-10-30: 40 mg via INTRA_ARTICULAR

## 2020-10-30 NOTE — Progress Notes (Signed)
Lacey Vanderveen T. Blayke Pinera, MD, [place] [facility] Lacey Brown, [address]  Phone: [phone]  FAX: [phone]  Lacey Brown - 80 y.o. female  MRN [id number]  Date of Birth: 10-18-1940  Date: 10/30/2020  PCP: Crecencio Mc, MD  Referral: Crecencio Mc, MD  Chief Complaint  Patient presents with  . Knee Pain    Left    This visit occurred during the SARS-CoV-2 public health emergency.  Safety protocols were in place, including screening questions prior to the visit, additional usage of staff PPE, and extensive cleaning of exam room while observing appropriate contact time as indicated for disinfecting solutions.   Subjective:   Lacey Brown is a 80 y.o. very pleasant female patient with Body mass index is 34.03 kg/m. who presents with the following:  I have seen the patient before for OA, most recently 07/20/2020:  At that point I gave her some Mobic and did a kenalog injection of her left knee.  Knee does not feel all that great.  Some pain with motion and with getting up and around and rotational movements at the knee.  Some pain going up and down stairs and rising from a seated position.  She does have some pain anteriorly as well as laterally.  Her motion is fairly good, and she is not having any significant effusion.  She is not having any mechanical locking up of the joint, and she is not having any symptomatic giving way.  She has no significant history of prior operative intervention.    I did review her March 2021 x-rays, and she does have minimal degenerative change.  Was not noted medial cortical irregularity that was not tender on palpation.    Review of Systems is noted in the HPI, as appropriate   Objective:   BP 140/86   Pulse 81   Temp 97.7 F (36.5 C) (Temporal)   Ht 4\' 11"  (1.499 m)   Wt 168 lb 8 oz (76.4 kg)   SpO2 98%   BMI 34.03 kg/m   Left knee: Full extension and  flexion to 120.  Stable to varus and valgus stress.  ACL and PCL are intact.  She does have some pain with loading the medial lateral patellar facets.  Lateral joint line is greater than medial joint line on exam today.  Mildly tender in the patellar tendon and not in the quad tendon.  Strength testing is 5/5, she is entirely neurovascularly intact. All provocative maneuvers including flexion pinch and McMurray's induce some mild pain, but not severe and no pain with bounce home testing.  Radiology: DG Knee 4 Views W/Patella Left  Result Date: 11/01/2020 CLINICAL DATA:  Follow-up knee pain, AO chest EXAM: LEFT KNEE - COMPLETE 4+ VIEW COMPARISON:  X-ray left tibia fibula 04/26/2019 FINDINGS: No evidence of fracture, dislocation, or joint effusion. Mild medial tibiofemoral and patellofemoral compartment joint space narrowing. No evidence of severe arthropathy. No aggressive appearing focal bone abnormality. Soft tissues are unremarkable. Vascular calcifications. IMPRESSION: No acute displaced fracture or dislocation. Electronically Signed   By: Iven Finn M.D.   On: 11/01/2020 00:02     Assessment and Plan:     ICD-10-CM   1. Primary osteoarthritis of left knee  M17.12 DG Knee 4 Views W/Patella Left    triamcinolone acetonide (KENALOG-40) injection 40 mg     Left-sided knee osteoarthritis with exacerbation. I do think on her plain films in comparison  to her films in early 2021, she has had some progression with mild tricompartmental arthritis.  I think that she has room to increase her scheduled Tylenol as well as ibuprofen.  I reviewed the dosing with this.  If she has diminishing return from steroid injection, she would be an excellent candidate for viscosupplementation.  Continue to try to be active to maintain leg and hip strength.  Aspiration/Injection Procedure Note Katalea Ucci 06/14/1940 Date of procedure: 10/30/2020  Procedure: Large Joint Aspiration / Injection of Knee,  L Indications: Pain  Procedure Details Patient verbally consented to procedure. Risks, benefits, and alternatives explained. Sterilely prepped with Chloraprep. Ethyl cholride used for anesthesia. 9 cc Lidocaine 1% mixed with 1 mL of Kenalog 40 mg injected using the anteromedial approach without difficulty. No complications with procedure and tolerated well. Patient had decreased pain post-injection. Medication: 1 mL of Kenalog 40 mg   Patient Instructions  You should be able to take generic ibuprofen orally. (Over the counter Motrin, Advil, or Generic Ibuprofen 200 mg tablets. 3 tablets by mouth 3 times a day. This equals a prescription strength dose.)   Take Tylenol/Acetaminophen ES (500mg ) 2 tabs by mouth three times a day max as needed.    Meds ordered this encounter  Medications  . triamcinolone acetonide (KENALOG-40) injection 40 mg   Medications Discontinued During This Encounter  Medication Reason  . meloxicam (MOBIC) 15 MG tablet Completed Course  . predniSONE (DELTASONE) 20 MG tablet Completed Course   Orders Placed This Encounter  Procedures  . DG Knee 4 Views W/Patella Left    Follow-up: As needed only  [employer] One speech-to-text software was used for transcription in this dictation.  Possible transcriptional errors can occur using Editor, commissioning.   Signed,  Maud Deed. Layann Bluett, MD   Outpatient Encounter Medications as of 10/30/2020  Medication Sig  . albuterol (VENTOLIN HFA) 108 (90 Base) MCG/ACT inhaler Inhale 2 puffs into the lungs every 6 (six) hours as needed for wheezing or shortness of breath.  . Cholecalciferol (VITAMIN D3) 2000 UNITS TABS Take 1 tablet by mouth daily.  . Fluticasone-Salmeterol (ADVAIR DISKUS) 250-50 MCG/DOSE AEPB TAKE 1 PUFF BY MOUTH TWICE A DAY **RINSE MOUTH AFTER USE**  . ibuprofen (ADVIL,MOTRIN) 400 MG tablet Take 400 mg by mouth daily as needed.   . metoprolol succinate (TOPROL-XL) 25 MG 24 hr tablet TAKE 1 TABLET BY MOUTH EVERY  DAY  . Omega-3 Fatty Acids (FISH OIL) 1000 MG CAPS Take 1 capsule by mouth daily.  Marland Kitchen omeprazole (PRILOSEC OTC) 20 MG tablet Take 20 mg by mouth as needed.   . prednisoLONE acetate (PRED FORTE) 1 % ophthalmic suspension   . [DISCONTINUED] losartan (COZAAR) 100 MG tablet Take 1 tablet (100 mg total) by mouth daily.  . [DISCONTINUED] Multiple Minerals-Vitamins (CALCIUM CITRATE PLUS PO) Take 1 tablet by mouth daily. (Patient not taking: Reported on 10/31/2020)  . [DISCONTINUED] vitamin C (ASCORBIC ACID) 500 MG tablet Take 1,000 mg by mouth daily as needed.  (Patient not taking: Reported on 10/31/2020)  . [DISCONTINUED] meloxicam (MOBIC) 15 MG tablet Take 1 tablet (15 mg total) by mouth daily. (Patient not taking: Reported on 08/01/2020)  . [DISCONTINUED] predniSONE (DELTASONE) 20 MG tablet 2 tabs po daily for 5 days, then 1 tab po daily for 5 days  . [EXPIRED] triamcinolone acetonide (KENALOG-40) injection 40 mg    No facility-administered encounter medications on file as of 10/30/2020.

## 2020-10-30 NOTE — Patient Instructions (Signed)
You should be able to take generic ibuprofen orally. (Over the counter Motrin, Advil, or Generic Ibuprofen 200 mg tablets. 3 tablets by mouth 3 times a day. This equals a prescription strength dose.)   Take Tylenol/Acetaminophen ES (500mg ) 2 tabs by mouth three times a day max as needed.

## 2020-10-31 ENCOUNTER — Encounter: Payer: Self-pay | Admitting: Internal Medicine

## 2020-10-31 ENCOUNTER — Ambulatory Visit (INDEPENDENT_AMBULATORY_CARE_PROVIDER_SITE_OTHER): Payer: Medicare HMO | Admitting: Internal Medicine

## 2020-10-31 ENCOUNTER — Other Ambulatory Visit: Payer: Self-pay

## 2020-10-31 VITALS — BP 168/90 | HR 73 | Temp 96.5°F | Ht 59.0 in | Wt 168.8 lb

## 2020-10-31 DIAGNOSIS — I10 Essential (primary) hypertension: Secondary | ICD-10-CM

## 2020-10-31 MED ORDER — TELMISARTAN 80 MG PO TABS: 80.0000 mg | ORAL_TABLET | Freq: Every day | ORAL | 1 refills | Status: DC

## 2020-10-31 NOTE — Patient Instructions (Addendum)
Your blood pressure is a little high today, so:    I am  changing your losartan to telmisartan.  You can make the change once you return from the beach.    AND :  I want you to start taking metoprolol at night instead of in the morning.  Starting tonight  Continue losartan (or telmisartan ) in the morning    I want you to start using 1000 mg tylenol every 12 hours (2 times daily total) and omit the ibuprofen .Marland Kitchen It is safer,  so if it controls your pain, it is a safer long term strategy  If your pain is not controlled  add back one ibuprofen twice daily

## 2020-10-31 NOTE — Assessment & Plan Note (Signed)
she reports compliance with medication regimen  but has an elevated reading today in office.  She is  using NSAIDs daily.  Discussed goal of 130/80 for patients over 70  to preserve renal function.  She has been asked to stop using NSAIDS and use tylenol as a trial,  check her  BP  at home and  submit readings for evaluation.  Continue telmisartan and metoprolol.  Checking urine for protein

## 2020-10-31 NOTE — Progress Notes (Signed)
Subjective:  Patient ID: Lacey Brown, female    DOB: [birth year]  Age: 80 y.o. MRN: [id number]  CC: The primary encounter diagnosis was Essential hypertension. A diagnosis of Primary hypertension was also pertinent to this visit.  HPI Lacey Brown presents for follow up on hypertension  Chief Complaint  Patient presents with   Follow-up    3 month follow up on blood pressure    This visit occurred during the SARS-CoV-2 public health emergency.  Safety protocols were in place, including screening questions prior to the visit, additional usage of staff PPE, and extensive cleaning of exam room while observing appropriate contact time as indicated for disinfecting solutions.   HTN: has been repeatedly elevated in our office,  but review of other office readings have been repeatedly normal .  Does not check at home bc she has tried in the past and did not like the machin.   Still taking ibuprofen  on a regular basis fo rmanagement of arthritis   and back pain.  ,  took 2 this morning.    Having cataracts evaluated by Tommy Rainwater in [place] for surgery  Outpatient Medications Prior to Visit  Medication Sig Dispense Refill   acetaminophen (TYLENOL) 325 MG tablet Take 650 mg by mouth every 6 (six) hours as needed.     albuterol (VENTOLIN HFA) 108 (90 Base) MCG/ACT inhaler Inhale 2 puffs into the lungs every 6 (six) hours as needed for wheezing or shortness of breath. 1 each 2   Calcium Carbonate-Vit D-Min (CALCIUM 1200 PO) Take 1 tablet by mouth daily.     Cholecalciferol (VITAMIN D3) 2000 UNITS TABS Take 1 tablet by mouth daily.     Fluticasone-Salmeterol (ADVAIR DISKUS) 250-50 MCG/DOSE AEPB TAKE 1 PUFF BY MOUTH TWICE A DAY **RINSE MOUTH AFTER USE** 180 each 2   ibuprofen (ADVIL,MOTRIN) 400 MG tablet Take 400 mg by mouth daily as needed.      metoprolol succinate (TOPROL-XL) 25 MG 24 hr tablet TAKE 1 TABLET BY MOUTH EVERY DAY 90 tablet 0   Omega-3 Fatty Acids (FISH OIL) 1000 MG CAPS  Take 1 capsule by mouth daily.     omeprazole (PRILOSEC OTC) 20 MG tablet Take 20 mg by mouth as needed.      prednisoLONE acetate (PRED FORTE) 1 % ophthalmic suspension      losartan (COZAAR) 100 MG tablet Take 1 tablet (100 mg total) by mouth daily. 90 tablet 1   Multiple Minerals-Vitamins (CALCIUM CITRATE PLUS PO) Take 1 tablet by mouth daily. (Patient not taking: Reported on 10/31/2020)     vitamin C (ASCORBIC ACID) 500 MG tablet Take 1,000 mg by mouth daily as needed.  (Patient not taking: Reported on 10/31/2020)     No facility-administered medications prior to visit.    Review of Systems;  Patient denies headache, fevers, malaise, unintentional weight loss, skin rash, eye pain, sinus congestion and sinus pain, sore throat, dysphagia,  hemoptysis , cough, dyspnea, wheezing, chest pain, palpitations, orthopnea, edema, abdominal pain, nausea, melena, diarrhea, constipation, flank pain, dysuria, hematuria, urinary  Frequency, nocturia, numbness, tingling, seizures,  Focal weakness, Loss of consciousness,  Tremor, insomnia, depression, anxiety, and suicidal ideation.      Objective:  BP (!) 168/90 (BP Location: Left Arm, Patient Position: Sitting, Cuff Size: Normal)   Pulse 73   Temp (!) 96.5 F (35.8 C) (Temporal)   Ht 4\' 11"  (1.499 m)   Wt 168 lb 12.8 oz (76.6 kg)   SpO2 99%  BMI 34.09 kg/m   BP Readings from Last 3 Encounters:  10/31/20 (!) 168/90  10/30/20 140/86  08/01/20 140/72    Wt Readings from Last 3 Encounters:  10/31/20 168 lb 12.8 oz (76.6 kg)  10/30/20 168 lb 8 oz (76.4 kg)  08/01/20 169 lb 9.6 oz (76.9 kg)    General appearance: alert, cooperative and appears stated age Ears: normal TM's and external ear canals both ears Throat: lips, mucosa, and tongue normal; teeth and gums normal Neck: no adenopathy, no carotid bruit, supple, symmetrical, trachea midline and thyroid not enlarged, symmetric, no tenderness/mass/nodules Back: symmetric, no curvature. ROM  normal. No CVA tenderness. Lungs: clear to auscultation bilaterally Heart: regular rate and rhythm, S1, S2 normal, no murmur, click, rub or gallop Abdomen: soft, non-tender; bowel sounds normal; no masses,  no organomegaly Pulses: 2+ and symmetric Skin: Skin color, texture, turgor normal. No rashes or lesions Lymph nodes: Cervical, supraclavicular, and axillary nodes normal.  No results found for: HGBA1C  Lab Results  Component Value Date   CREATININE 0.73 08/09/2020   CREATININE 1.09 08/01/2020   CREATININE 0.87 07/26/2020    Lab Results  Component Value Date   WBC 7.3 12/02/2019   HGB 12.5 12/02/2019   HCT 37.2 12/02/2019   PLT 231.0 12/02/2019   GLUCOSE 97 08/09/2020   CHOL 203 (H) 08/01/2020   TRIG 58.0 08/01/2020   HDL 83.40 08/01/2020   LDLCALC 108 (H) 08/01/2020   ALT 13 08/09/2020   AST 13 08/09/2020   NA 128 (L) 08/09/2020   K 4.5 08/09/2020   CL 97 08/09/2020   CREATININE 0.73 08/09/2020   BUN 14 08/09/2020   CO2 26 08/09/2020    No results found.  Assessment & Plan:   Problem List Items Addressed This Visit       Unprioritized   HTN (hypertension)    she reports compliance with medication regimen  but has an elevated reading today in office.  She is  using NSAIDs daily.  Discussed goal of 130/80 for patients over 70  to preserve renal function.  She has been asked to stop using NSAIDS and use tylenol as a trial,  check her  BP  at home and  submit readings for evaluation.  Continue telmisartan and metoprolol.  Checking urine for protein       Relevant Medications   telmisartan (MICARDIS) 80 MG tablet   Other Visit Diagnoses     Essential hypertension    -  Primary   Relevant Medications   telmisartan (MICARDIS) 80 MG tablet   Other Relevant Orders   Microalbumin / creatinine urine ratio       Meds ordered this encounter  Medications   telmisartan (MICARDIS) 80 MG tablet    Sig: Take 1 tablet (80 mg total) by mouth daily.    Dispense:  90  tablet    Refill:  1    Medications Discontinued During This Encounter  Medication Reason   Multiple Minerals-Vitamins (CALCIUM CITRATE PLUS PO)    vitamin C (ASCORBIC ACID) 500 MG tablet    losartan (COZAAR) 100 MG tablet    I provided  30 minutes of  face-to-face time during this encounter reviewing patient's recent BP readings,  labs and imaging studies, providing counseling on the above mentioned problems , and coordination  of care .   Follow-up: No follow-ups on file.   Crecencio Mc, MD

## 2020-11-01 LAB — MICROALBUMIN / CREATININE URINE RATIO
Creatinine,U: 16 mg/dL
Microalb Creat Ratio: 6.6 mg/g (ref 0.0–30.0)
Microalb, Ur: 1.1 mg/dL (ref 0.0–1.9)

## 2020-11-06 ENCOUNTER — Telehealth: Payer: Self-pay

## 2020-11-06 ENCOUNTER — Other Ambulatory Visit: Payer: Self-pay | Admitting: Internal Medicine

## 2020-11-06 DIAGNOSIS — M25562 Pain in left knee: Secondary | ICD-10-CM | POA: Diagnosis not present

## 2020-11-06 NOTE — Telephone Encounter (Signed)
[place] Night - Client TELEPHONE ADVICE RECORD [facility] Patient Name: Lacey Brown Gender: Female DOB: 08/21/1940 Age: 80 Y 2 M 15 D Return Phone Number: [phone] (Primary) Address: City/ State/ Zip: [place] Night - Client Client Site [place] Physician Copland, Frederico Hamman - MD Contact Type Call Who Is Calling Patient / Member / Family / Caregiver Call Type Triage / Clinical Relationship To Patient Self Return Phone Number [phone] (Primary) Chief Complaint Walking difficulty Reason for Call Symptomatic / Request for Health Information Initial Comment Caller states she was in the office Monday and was given a cortisone shot, and it been really painful. She would like to speak to the doctor because the shot hasn't helped her knee, she can barely walk. Translation No Nurse Assessment Nurse: Marina Gravel, RN, Carmelia Bake Date/Time Eilene Ghazi Time): 11/05/2020 5:26:04 PM Confirm and document reason for call. If symptomatic, describe symptoms. ---Caller states she was in the office Monday and was given a cortisone shot, and it been really painful. She would like to speak to the doctor because the shot hasn't helped her knee, she can barely walk. States it did help at first, but since Friday the pain is worse than before and is barely able to walk. Does the patient have any new or worsening symptoms? ---Yes Will a triage be completed? ---Yes Related visit to physician within the last 2 weeks? ---Yes Does the PT have any chronic conditions? (i.e. diabetes, asthma, this includes High risk factors for pregnancy, etc.) ---Yes List chronic conditions. ---HTN chronic knee pain Is this a behavioral health or substance abuse call? ---No Nurse: Marina Gravel, RN, Carmelia Bake Date/Time (Eastern Time): 11/05/2020 5:49:33 PM Please select the assessment type ---Standing order Additional  Documentation ---Caller is having increased pain since Friday and has been taking 200mg  ibuprofen. States it helps some, but not completely. Other current medications? ---Unknown Medication allergies? ---Unknown PLEASE NOTE: All timestamps contained within this report are represented as [place] Standard Time. CONFIDENTIALTY NOTICE: This fax transmission is intended only for the addressee. It contains information that is legally privileged, confidential or otherwise protected from use or disclosure. If you are not the intended recipient, you are strictly prohibited from reviewing, disclosing, copying using or disseminating any of this information or taking any action in reliance on or regarding this information. If you have received this fax in error, please notify us immediately by telephone so that we can arrange for its return to [place]. Phone: [phone], Toll-Free: [phone], Fax: [phone] Page: 2 of 3 Call Id: [id number] Nurse Assessment Pharmacy name and phone number. ---[facility] @ [phone] Guidelines Guideline Title Affirmed Question Affirmed Notes Nurse Date/Time Eilene Ghazi Time) Knee Pain [1] MODERATE pain (e.g., interferes with normal activities, limping) AND [2] present > 3 days [name] 11/05/2020 5:28:34 PM Disp. Time Eilene Ghazi Time) Disposition Final User 11/05/2020 5:43:15 PM SEE PCP WITHIN 3 DAYS Yes Marina Gravel, RN, Carmelia Bake Caller Disagree/Comply Comply Caller Understands Yes PreDisposition Home Care Care Advice Given Per Guideline SEE PCP WITHIN 3 DAYS: * You need to be seen within 2 or 3 days. REST YOUR KNEE FOR THE NEXT COUPLE DAYS: * Avoid activities that worsen your pain. * Reduce activities that put a lot of strain on the knee joint (e.g., deep knee bends, stair climbing, running). LOCAL HEAT: * Apply a warm washcloth or heating pad for 10 minutes three times daily. * This will help to increase circulation and improve healing. *  IBUPROFEN (E.G.,  MOTRIN, ADVIL): Take 400 mg (two 200 mg pills) by mouth every 6 hours. The most you should take is 6 pills a day (1,200 mg total). CALL BACK IF: * Fever or severe knee pain occurs * Redness or severe swelling occurs * You become worse CARE ADVICE given per Knee Pain (Adult) guideline. Standing Orders Preparation Additional Instructions Route FrequencyDuration Nurse Comments User Name Motrin 800 mg 1 tablet # 6 Oral Every 8 Hours Marina Gravel, RN, [employer]

## 2020-11-06 NOTE — Telephone Encounter (Addendum)
I spoke with pt; pt said received cortisone shot to lt knee on 10/30/20 and it helped the pain for few days but on 11/03/20 the lt knee pain returned and got worse over weekend; pt said motrin helps somewhat. But pt still as knee pain; pain level now is 8. Pt can walk slightly better today but still has some difficulty in walking due to lt knee pain. Pt saw Dr Candelaria Stagers at [facility] few wks ago but when pt called his office pt was told since Dr Candelaria Stagers has not seen pt for knee pain she cannot be seen today. Pt is going to [facility] on [address]. Pt said she has to bathe and then will probably go at lunch time and pt wanted to verify that the [facility] UC did not close at lunch time. I spoke with Alvester Chou at [facility] [phone] and he said they do not close during lunch and they do not make appts in UC. pt voiced understanding and will go to [facility] in [place] today. UC & ED precautions given and pt voiced understanding. Dr Lorelei Pont out of office until 11/20/20 and will send note to Dr Lorelei Pont as Juluis Rainier and Dr Derrel Nip as PCP. I also faxed to [place] [phone] the Access nurse med order for motrin 800 mg to Dr Lorelei Pont for his return.

## 2020-11-06 NOTE — Telephone Encounter (Signed)
I called the patient, but her cell phone went straight to voicemail.  I left her a message with advice to seek evaluation today.  I am unavailable today and on vacation.  I think that [facility] walk-in clinic is a very good idea.  She needs to have face-to-face evaluation and exam to triage.  Possible post-injection flare, but she needs assessment to rule out other severe complications.  I appreciate their assistance.

## 2020-11-07 NOTE — Telephone Encounter (Signed)
She will follow-up with me in 2 weeks if bad pain returns.

## 2020-11-07 NOTE — Telephone Encounter (Signed)
I called and talked to Lacey Brown just now. She is feeling fine now with minimal pain.  She really had a lot of pain Friday - Sunday.  She had no inciting injury or overuse.   She went to [facility] urgent care yesterday.  They were reassured (per report) that she had no acute injury or major problems with her knee.  She was given a 6 days prednisone taper, and she essentially is back to baseline.   Most likely cause would be post-injection reaction to Kenalog.  I am going to put this as an "allergy" in the patient's chart.  She should be able to take Solu-Medrol, Depo-Medrol, Decadron, Celestone, etc.  As an injectable in the future.

## 2020-11-08 NOTE — Telephone Encounter (Signed)
Patient notified as instructed by telephone and verbalized understanding. Patient scheduled for an appointment with Dr. Lorelei Pont 11/22/20 at 2:00 pm. Patient stated if her knee gets better and she does not need to keep this appointment she will call ahead of time and cancel the appointment.

## 2020-11-08 NOTE — Telephone Encounter (Signed)
I do not know who to send this to?  Can we make sure that she has a follow-up appointment 10/17 or 10/19?  A lot of these questions are impossible to answer. Since orthopedics examined her, we can feel very reassured that nothing severe is happening with her knee.  This can be "post-injection inflammatory reaction", or tendonitis like the Ortho PA thought.  Ankle pain and leg pain should not be from any injection, but might be irritated/inflammed if she has some pain with walking.  Ice knee 20 mins, 2-3 times a day. Take Tylenol extra strength, 2 tablets 3 times a day.

## 2020-11-08 NOTE — Telephone Encounter (Signed)
Spoke to patient by telephone and was advised that she was doing okay when she talked with Dr. Lorelei Pont yesterday. Patient stated later in the day after she had been up moving around the pain returned. Patient stated that she was having pain in her left ankle and leg. Patient stated that her knee started throbbing. Patient stated that she has been using ice to help her symptoms. Patient wants to know how long it will take for the injection medication to get out of system. Patient stated that she has been on Prednisone for several days and wants to know how long it takes for that to work. Patient stated that she has been just sitting around this morning staying off of the leg. Patient wants to know if there is anything else she can do for her left leg. [facility]

## 2020-11-08 NOTE — Telephone Encounter (Signed)
Pt called in stated she has questions and concern about the reaction from the injection Please Advise [phone]

## 2020-11-11 DIAGNOSIS — M25562 Pain in left knee: Secondary | ICD-10-CM | POA: Diagnosis not present

## 2020-11-12 ENCOUNTER — Other Ambulatory Visit: Payer: Self-pay

## 2020-11-12 ENCOUNTER — Emergency Department (HOSPITAL_COMMUNITY): Payer: Medicare HMO

## 2020-11-12 ENCOUNTER — Emergency Department (HOSPITAL_COMMUNITY)
Admission: EM | Admit: 2020-11-12 | Discharge: 2020-11-12 | Disposition: A | Discharge status: Home / Self Care | Payer: Medicare HMO | Attending: Emergency Medicine | Admitting: Emergency Medicine

## 2020-11-12 ENCOUNTER — Emergency Department (HOSPITAL_BASED_OUTPATIENT_CLINIC_OR_DEPARTMENT_OTHER): Payer: Medicare HMO

## 2020-11-12 DIAGNOSIS — M79652 Pain in left thigh: Secondary | ICD-10-CM | POA: Diagnosis not present

## 2020-11-12 DIAGNOSIS — I739 Peripheral vascular disease, unspecified: Secondary | ICD-10-CM | POA: Diagnosis not present

## 2020-11-12 DIAGNOSIS — I10 Essential (primary) hypertension: Secondary | ICD-10-CM | POA: Insufficient documentation

## 2020-11-12 DIAGNOSIS — J45909 Unspecified asthma, uncomplicated: Secondary | ICD-10-CM | POA: Diagnosis not present

## 2020-11-12 DIAGNOSIS — G8929 Other chronic pain: Secondary | ICD-10-CM

## 2020-11-12 DIAGNOSIS — Z87891 Personal history of nicotine dependence: Secondary | ICD-10-CM | POA: Diagnosis not present

## 2020-11-12 DIAGNOSIS — M79605 Pain in left leg: Secondary | ICD-10-CM | POA: Diagnosis not present

## 2020-11-12 DIAGNOSIS — Z7951 Long term (current) use of inhaled steroids: Secondary | ICD-10-CM | POA: Diagnosis not present

## 2020-11-12 DIAGNOSIS — M25562 Pain in left knee: Secondary | ICD-10-CM | POA: Insufficient documentation

## 2020-11-12 DIAGNOSIS — Z79899 Other long term (current) drug therapy: Secondary | ICD-10-CM | POA: Insufficient documentation

## 2020-11-12 DIAGNOSIS — M79662 Pain in left lower leg: Secondary | ICD-10-CM | POA: Diagnosis not present

## 2020-11-12 IMAGING — DX DG TIBIA/FIBULA 2V*L*
4 series · 4 of 4 positions shown · non-contrast
Comparison: Left knee series today. Left tib fib series [DATE].

CLINICAL DATA: 80-year-old female with left lower extremity pain
for 2 weeks. Pain from the anterior patella radiating distally.

EXAM:
LEFT TIBIA AND FIBULA - 2 VIEW

[tibia ap (1 of 2)]
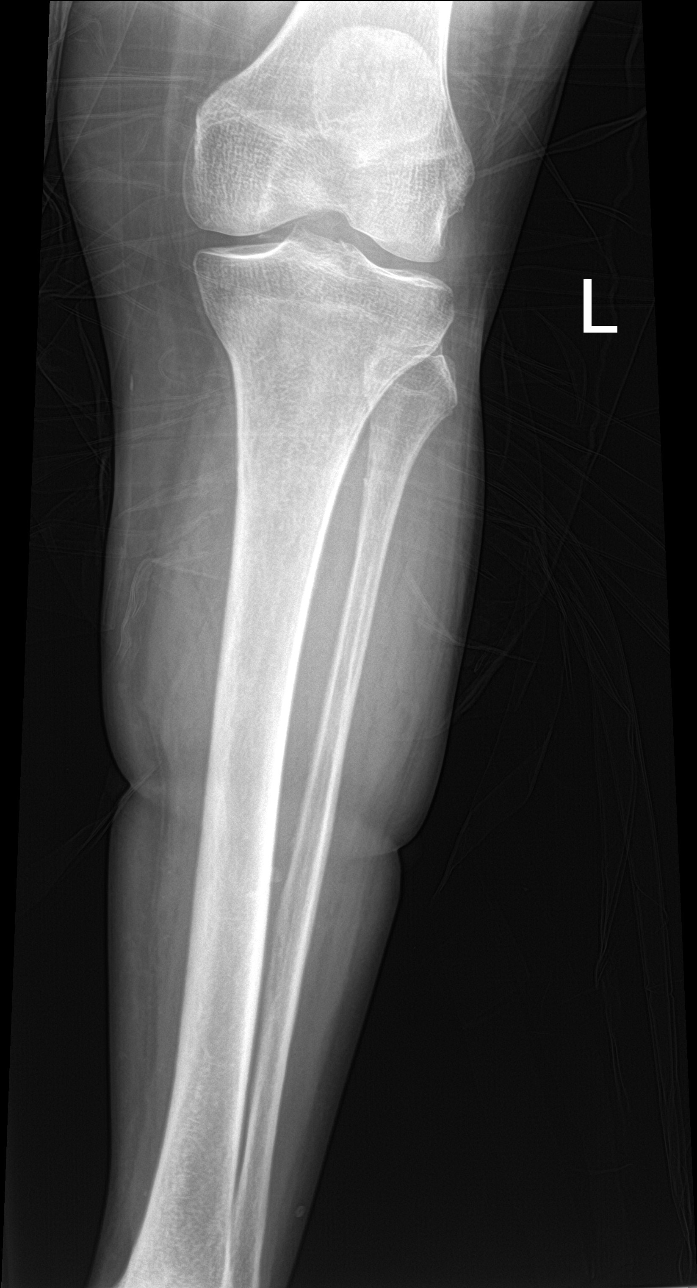

[tibia ap (2 of 2)]
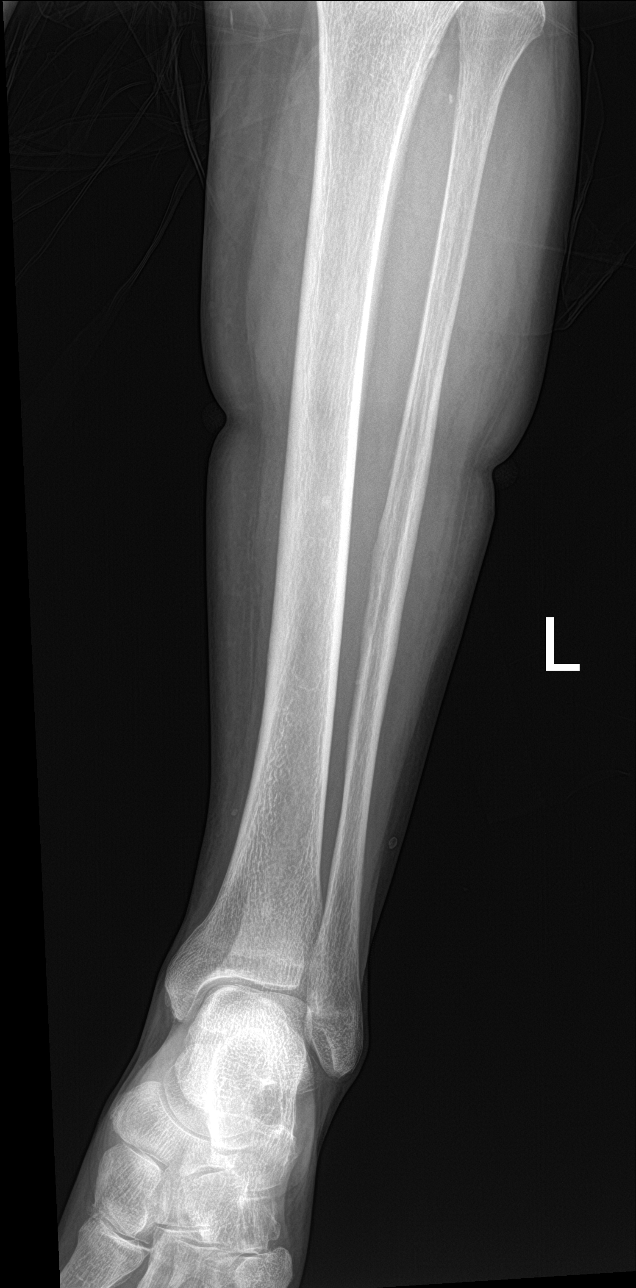

[tibia lat (1 of 2)]
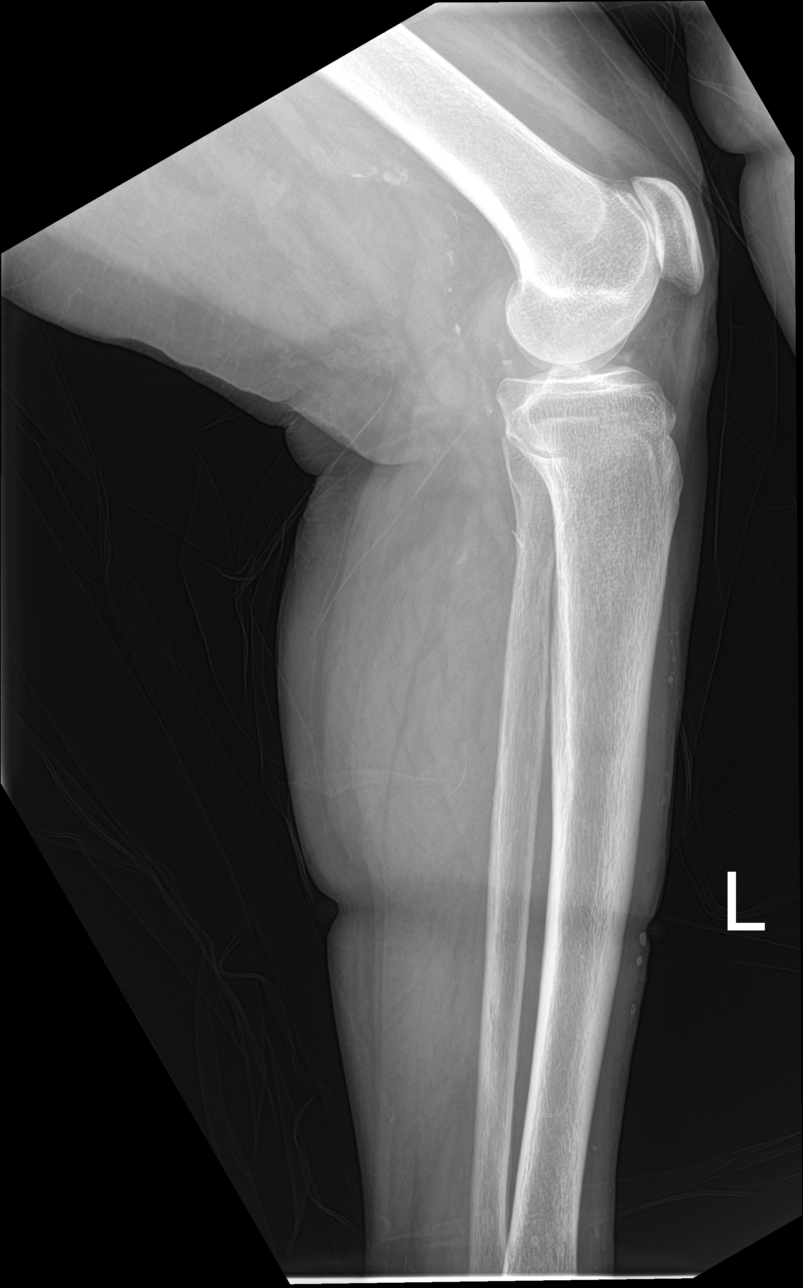

[tibia lat (2 of 2)]
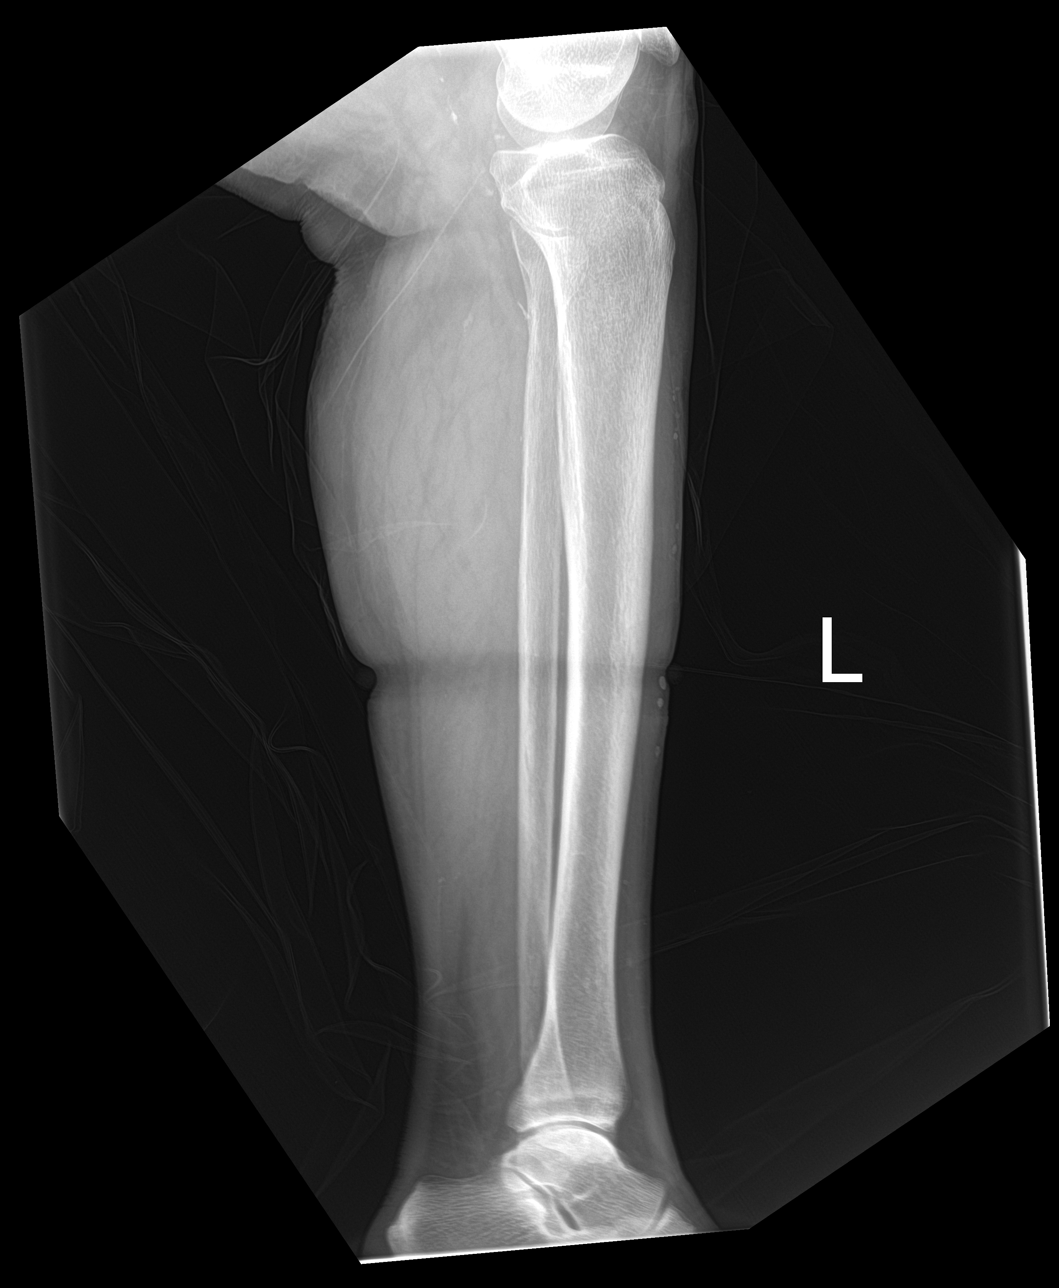

[4 of 4 positions shown; findings below may reference images not displayed]

FINDINGS: Bone mineralization is within normal limits for age. Preserved
alignment at the left knee and ankle. Mild chronic vascular
calcifications. There is no evidence of fracture or other focal bone
lesions.
IMPRESSION: Negative.

## 2020-11-12 IMAGING — DX DG KNEE COMPLETE 4+V*L*
4 series · 4 of 4 positions shown · non-contrast
Comparison: Left knee series [DATE].

CLINICAL DATA: 80-year-old female with left lower extremity pain
for 2 weeks. Pain from the anterior patella radiating distally.

EXAM:
LEFT KNEE - COMPLETE 4+ VIEW

[knee obl (1 of 2)]
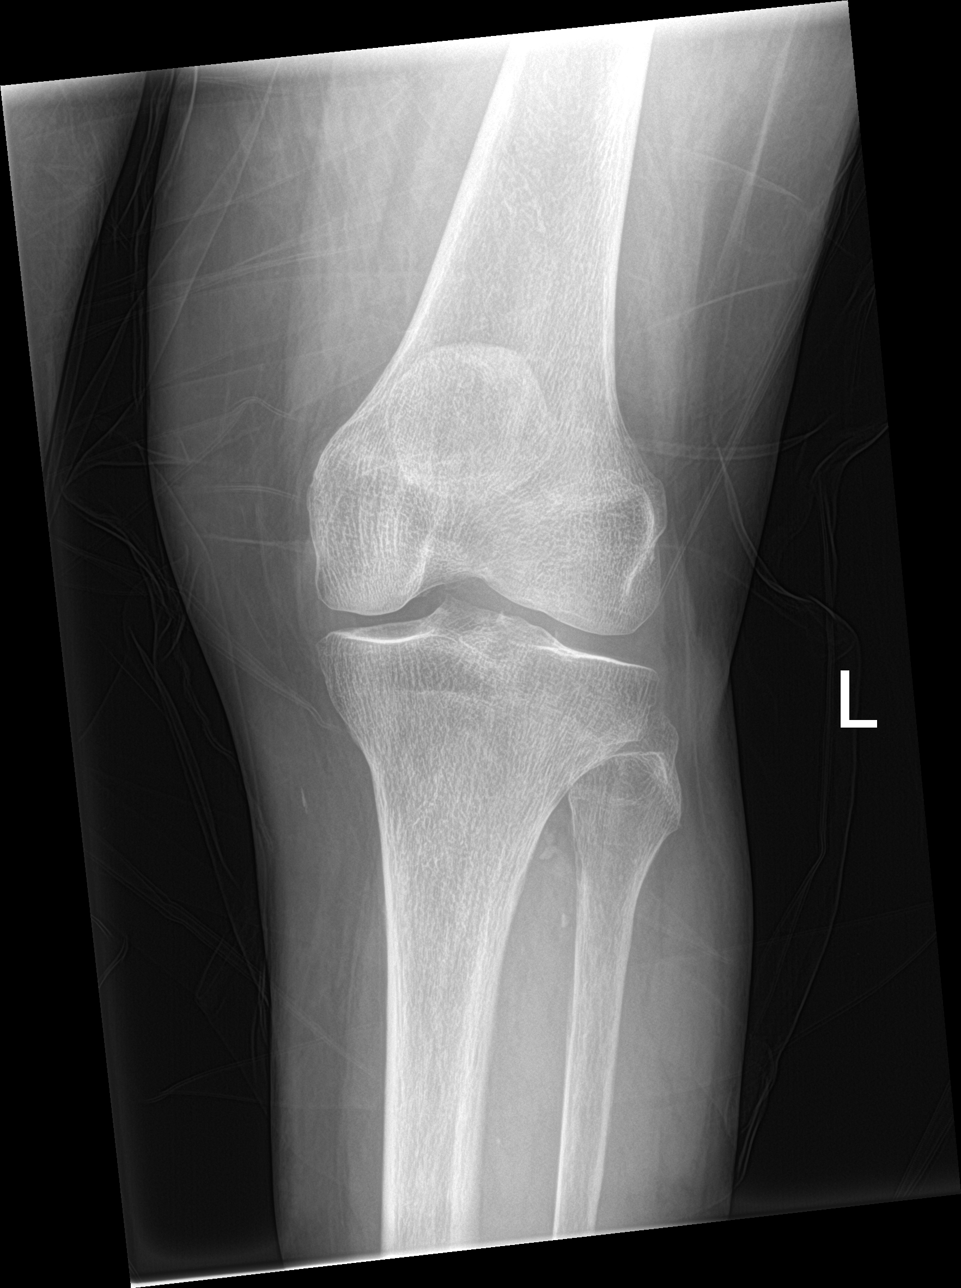

[knee lat]
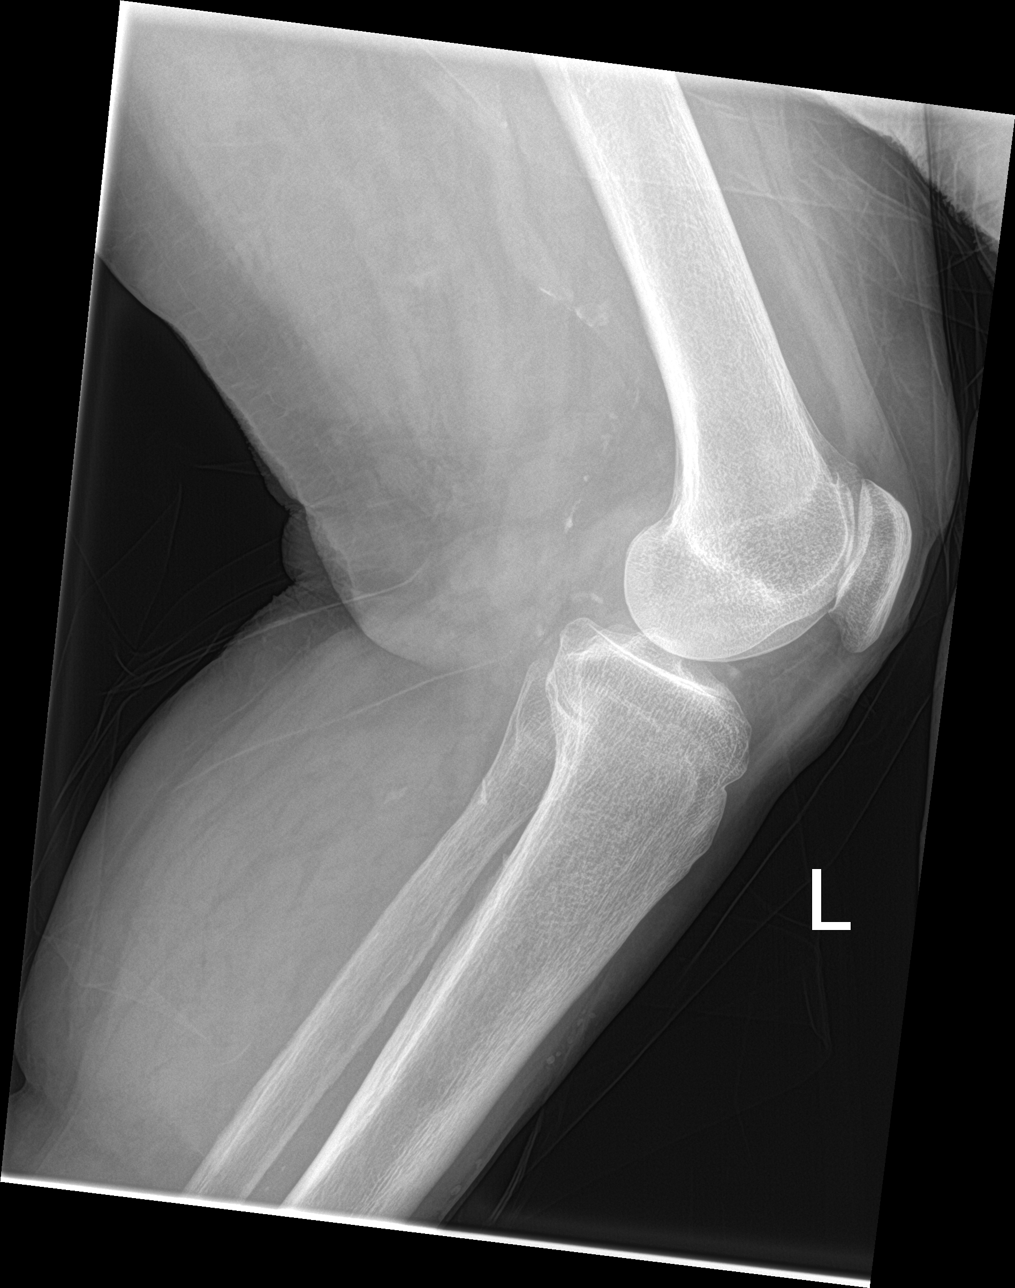

[knee ap]
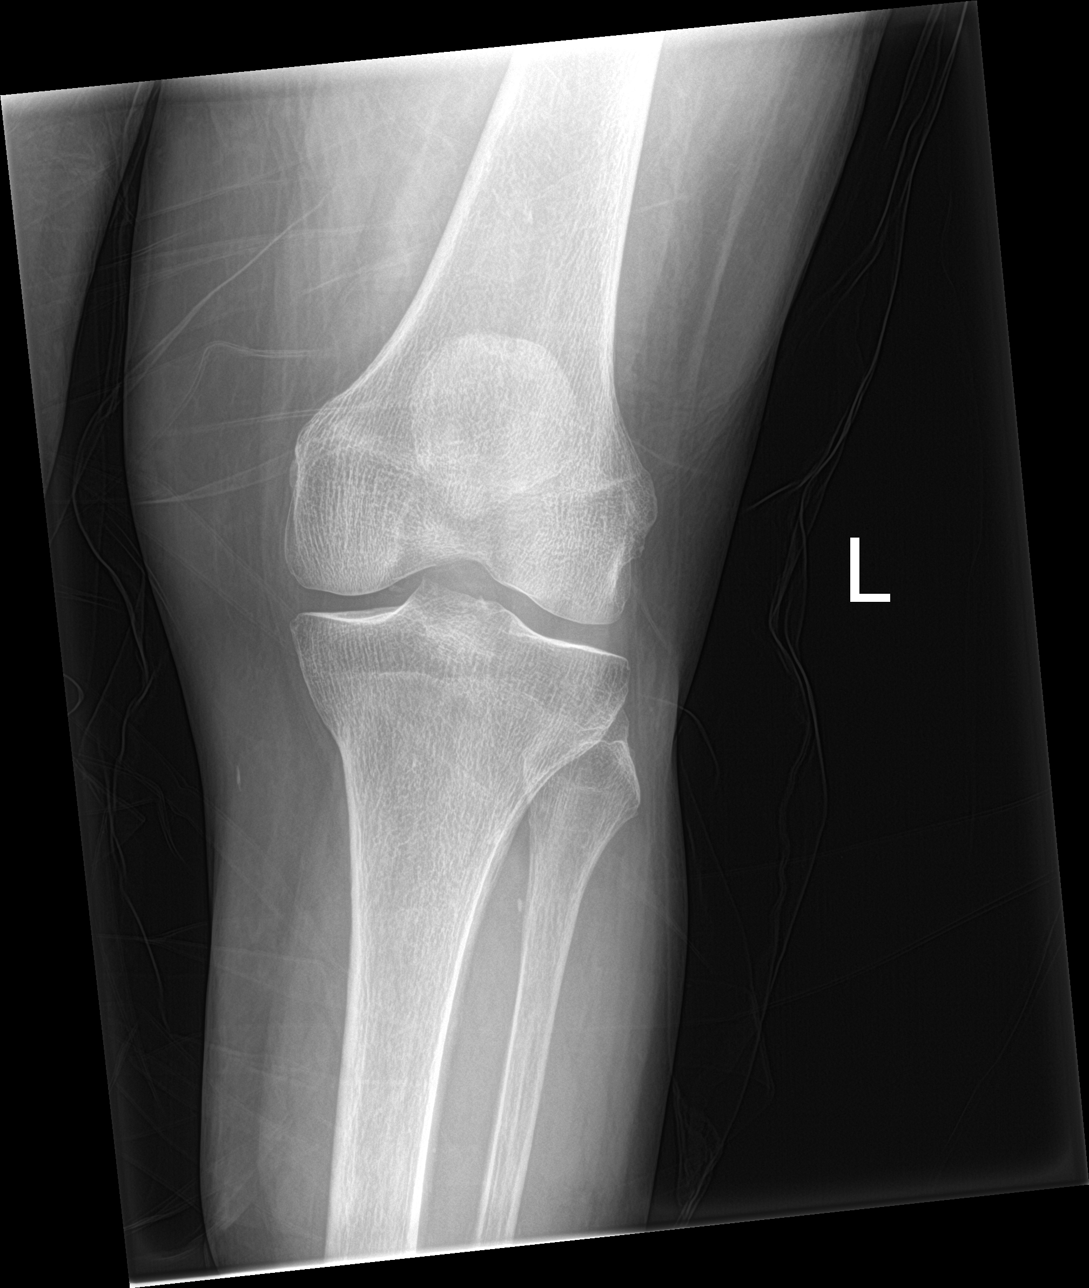

[knee obl (2 of 2)]
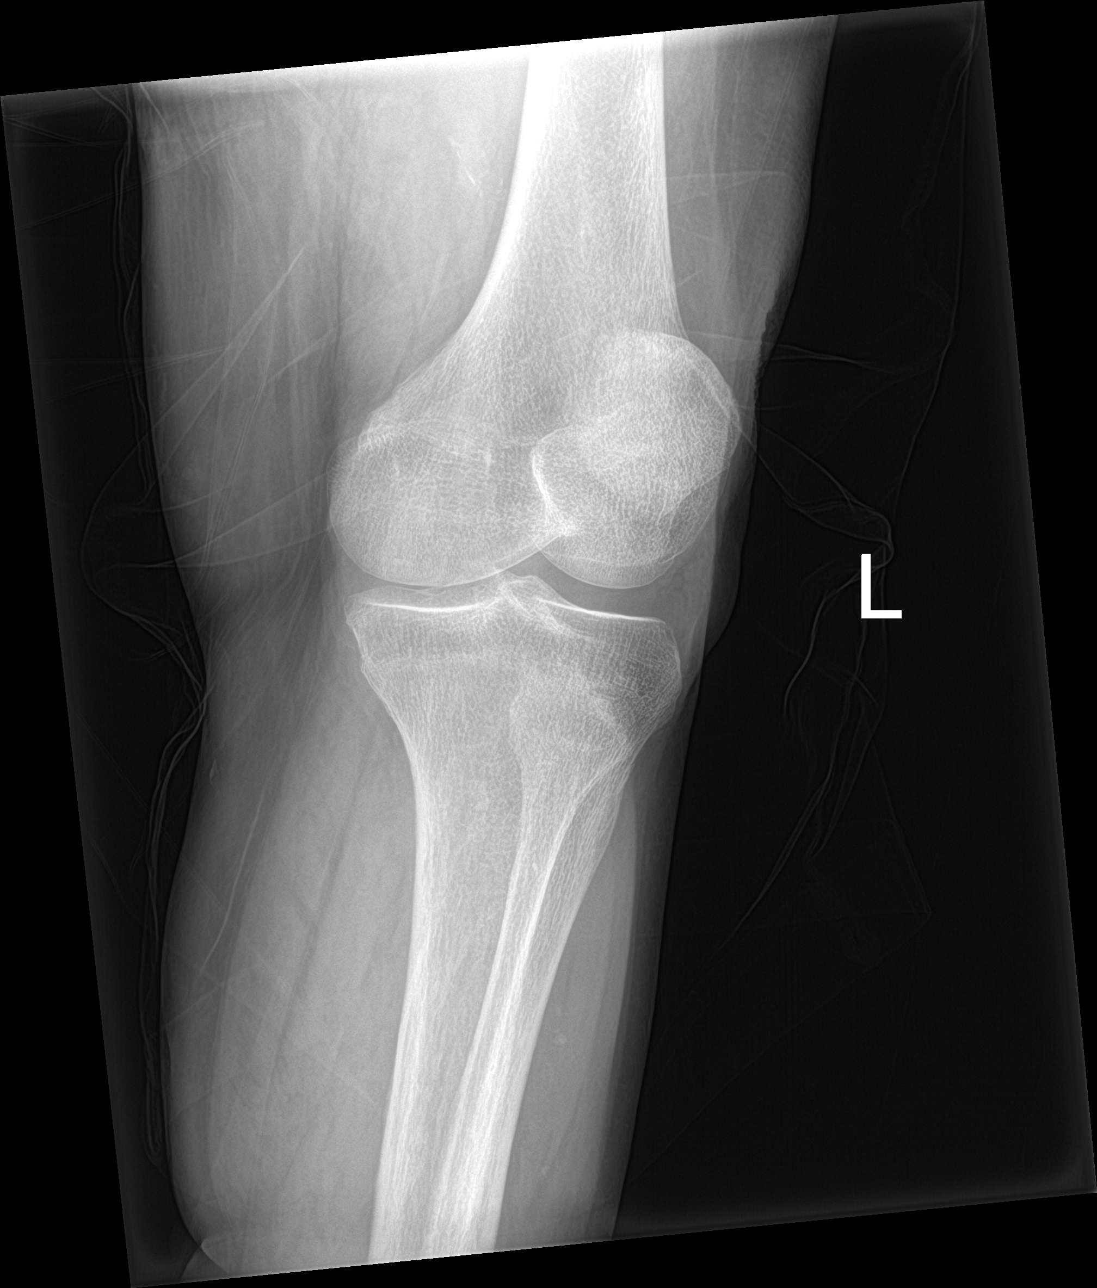

[4 of 4 positions shown; findings below may reference images not displayed]

FINDINGS: Bone mineralization is within normal limits for age. Normal joint
spaces and alignment for age. No joint effusion. Intact patella. No
acute osseous abnormality identified. Mild calcified peripheral
vascular disease.
IMPRESSION: Negative for age radiographic appearance of the left knee.

## 2020-11-12 MED ORDER — OXYCODONE HCL 5 MG PO TABS: 5.0000 mg | ORAL_TABLET | ORAL | 0 refills | Status: DC | PRN

## 2020-11-12 MED ORDER — OXYCODONE-ACETAMINOPHEN 5-325 MG PO TABS
1.0000 | ORAL_TABLET | Freq: Once | ORAL | Status: AC
  Administered 2020-11-12: 1 via ORAL
  Filled 2020-11-12: qty 1

## 2020-11-12 NOTE — ED Provider Notes (Signed)
[facility] EMERGENCY DEPARTMENT Provider Note   CSN: [id number] Arrival date & time: 11/12/20  0254     History Chief Complaint  Patient presents with   Knee Pain    Lacey Brown is a 80 y.o. female.  80 year old female presents with complaint of pain in her left leg and knee.  Patient was seen by sports medicine and had a injection in her left knee which she states provided relief for about 3 days and then became more painful.  Patient contacted her provider, was told that she had a different medication injected this time compared to prior injection, was concerned she may be having an allergic reaction to that injection.  Patient states she is having pain to the lateral aspect of her left knee, has some pain in her left lower leg as well as up into her left thigh.  Denies falls or injuries.  No other complaints or concerns.  Does report relief of her pain with Percocet given in triage.      Past Medical History:  Diagnosis Date   Arthritis    back, neck; right shoulder;    Asthma    using advair prn   Chicken pox    Hyperlipidemia    Hypertension    controlled with medication;     Patient Active Problem List   Diagnosis Date Noted   Aortic atherosclerosis ([place]) 08/02/2020   Osteoarthritis of knees, bilateral 08/02/2020   Osteoporosis 08/02/2020   Arthritis 10/24/2014   HTN (hypertension) 10/24/2014   Asthma 10/24/2014   GERD (gastroesophageal reflux disease) 10/24/2014    Past Surgical History:  Procedure Laterality Date   BREAST EXCISIONAL BIOPSY Left    TONSILLECTOMY AND ADENOIDECTOMY       OB History   No obstetric history on file.     Family History  Problem Relation Age of Onset   Heart disease Mother    Heart disease Father    Arthritis Maternal Grandmother    Diabetes Neg Hx    Cancer Neg Hx    Stroke Neg Hx     Social History   Tobacco Use   Smoking status: Former    Types: Cigarettes    Quit date: 04/17/2009     Years since quitting: 11.5   Smokeless tobacco: Never  Vaping Use   Vaping Use: Never used  Substance Use Topics   Alcohol use: Yes    Alcohol/week: 0.0 standard drinks    Comment: occasional   Drug use: No    Home Medications Prior to Admission medications   Medication Sig Start Date End Date Taking? Authorizing Provider  oxyCODONE (ROXICODONE) 5 MG immediate release tablet Take 1 tablet (5 mg total) by mouth every 4 (four) hours as needed for severe pain. 11/12/20  Yes Tacy Learn, PA-C  acetaminophen (TYLENOL) 325 MG tablet Take 650 mg by mouth every 6 (six) hours as needed.    [provider]  albuterol (VENTOLIN HFA) 108 (90 Base) MCG/ACT inhaler Inhale 2 puffs into the lungs every 6 (six) hours as needed for wheezing or shortness of breath. 08/01/20   Crecencio Mc, MD  Calcium Carbonate-Vit D-Min (CALCIUM 1200 PO) Take 1 tablet by mouth daily.    [provider]  Cholecalciferol (VITAMIN D3) 2000 UNITS TABS Take 1 tablet by mouth daily.    [provider]  Fluticasone-Salmeterol (ADVAIR DISKUS) 250-50 MCG/DOSE AEPB TAKE 1 PUFF BY MOUTH TWICE A DAY **RINSE MOUTH AFTER USE** 01/07/20  Jearld Fenton, NP  ibuprofen (ADVIL,MOTRIN) 400 MG tablet Take 400 mg by mouth daily as needed.     [provider]  metoprolol succinate (TOPROL-XL) 25 MG 24 hr tablet TAKE 1 TABLET BY MOUTH EVERY DAY 07/25/20   Dutch Quint B, FNP  Omega-3 Fatty Acids (FISH OIL) 1000 MG CAPS Take 1 capsule by mouth daily.    [provider]  omeprazole (PRILOSEC OTC) 20 MG tablet Take 20 mg by mouth as needed.     [provider]  prednisoLONE acetate (PRED FORTE) 1 % ophthalmic suspension  10/25/19   [provider]  telmisartan (MICARDIS) 80 MG tablet Take 1 tablet (80 mg total) by mouth daily. 10/31/20   Crecencio Mc, MD    Allergies    Ace inhibitors, Codeine, and Kenalog [triamcinolone acetonide]  Review of Systems   Review of Systems   Constitutional:  Negative for fever.  Musculoskeletal:  Positive for arthralgias and myalgias. Negative for joint swelling.  Skin:  Negative for wound.  Neurological:  Negative for weakness and numbness.   Physical Exam Updated Vital Signs BP (!) 152/86   Pulse 92   Temp 98.1 F (36.7 C) (Oral)   Resp 14   Ht 4\' 11"  (1.499 m)   Wt 76.2 kg   SpO2 95%   BMI 33.93 kg/m   Physical Exam Vitals and nursing note reviewed.  Constitutional:      General: She is not in acute distress.    Appearance: She is well-developed. She is not diaphoretic.  HENT:     Head: Normocephalic and atraumatic.  Cardiovascular:     Pulses: Normal pulses.  Pulmonary:     Effort: Pulmonary effort is normal.  Musculoskeletal:        General: Tenderness present. No swelling, deformity or signs of injury.     Right lower leg: No edema.     Left lower leg: No edema.     Comments: Mild left knee lateral joint line tenderness.  No effusion.  Able to straight leg raise.  DP pulse present, sensation intact.  Skin:    General: Skin is warm and dry.     Findings: No erythema or rash.  Neurological:     Mental Status: She is alert and oriented to person, place, and time.     Sensory: No sensory deficit.     Motor: No weakness.  Psychiatric:        Behavior: Behavior normal.    ED Results / Procedures / Treatments   Labs (all labs ordered are listed, but only abnormal results are displayed) Labs Reviewed - No data to display  EKG None  Radiology DG Tibia/Fibula Left  Result Date: 11/12/2020 CLINICAL DATA:  80 year old female with left lower extremity pain for 2 weeks. Pain from the anterior patella radiating distally. EXAM: LEFT TIBIA AND FIBULA - 2 VIEW COMPARISON:  Left knee series today. Left tib fib series 04/26/2019. FINDINGS: Bone mineralization is within normal limits for age. Preserved alignment at the left knee and ankle. Mild chronic vascular calcifications. There is no evidence of fracture  or other focal bone lesions. IMPRESSION: Negative. Electronically Signed   By: Genevie Ann M.D.   On: 11/12/2020 04:54   DG Knee Complete 4 Views Left  Result Date: 11/12/2020 CLINICAL DATA:  80 year old female with left lower extremity pain for 2 weeks. Pain from the anterior patella radiating distally. EXAM: LEFT KNEE - COMPLETE 4+ VIEW COMPARISON:  Left knee series 10/30/2020. FINDINGS:  Bone mineralization is within normal limits for age. Normal joint spaces and alignment for age. No joint effusion. Intact patella. No acute osseous abnormality identified. Mild calcified peripheral vascular disease. IMPRESSION: Negative for age radiographic appearance of the left knee. Electronically Signed   By: Genevie Ann M.D.   On: 11/12/2020 04:53   VAS [place] LOWER EXTREMITY VENOUS (DVT) (7a-7p)  Result Date: 11/12/2020  Lower Venous DVT Study Patient Name:  Lacey Brown  Date of Exam:   11/12/2020 Medical Rec #: [id number]             Accession #:    [id number] Date of Birth: [birth year]             Patient Gender: F Patient Age:   14 years Exam Location:  [facility] Procedure:      VAS [place] LOWER EXTREMITY VENOUS (DVT) Referring Phys: Mickel Baas Anela Bensman --------------------------------------------------------------------------------  Indications: Knee, lateral thigh, and buttock pain.  Limitations: Patient could not be still, constantly sitting up and moving left leg. Comparison Study: No prior study on file Performing Technologist: Sharion Dove RVS  Examination Guidelines: A complete evaluation includes B-mode imaging, spectral Doppler, color Doppler, and power Doppler as needed of all accessible portions of each vessel. Bilateral testing is considered an integral part of a complete examination. Limited examinations for reoccurring indications may be performed as noted. The reflux portion of the exam is performed with the patient in reverse Trendelenburg.   +-----+---------------+---------+-----------+----------+--------------+ RIGHTCompressibilityPhasicitySpontaneityPropertiesThrombus Aging +-----+---------------+---------+-----------+----------+--------------+ CFV  Full           Yes      Yes                                 +-----+---------------+---------+-----------+----------+--------------+   +---------+---------------+---------+-----------+----------+--------------+ LEFT     CompressibilityPhasicitySpontaneityPropertiesThrombus Aging +---------+---------------+---------+-----------+----------+--------------+ CFV      Full           Yes      Yes                                 +---------+---------------+---------+-----------+----------+--------------+ SFJ      Full                                                        +---------+---------------+---------+-----------+----------+--------------+ FV Prox  Full                                                        +---------+---------------+---------+-----------+----------+--------------+ FV Mid   Full                                                        +---------+---------------+---------+-----------+----------+--------------+ FV DistalFull                                                        +---------+---------------+---------+-----------+----------+--------------+  PFV      Full                                                        +---------+---------------+---------+-----------+----------+--------------+ POP      Full                                                        +---------+---------------+---------+-----------+----------+--------------+ PTV      Full                                                        +---------+---------------+---------+-----------+----------+--------------+ PERO     Full                                                         +---------+---------------+---------+-----------+----------+--------------+ could not Doppler popliteal vein as patient could not keep leg still    Summary: RIGHT: - No evidence of common femoral vein obstruction.  LEFT: - There is no evidence of deep vein thrombosis in the lower extremity.  *See table(s) above for measurements and observations.    Preliminary     Procedures Procedures   Medications Ordered in ED Medications  oxyCODONE-acetaminophen (PERCOCET/ROXICET) 5-325 MG per tablet 1 tablet (1 tablet Oral Given 11/12/20 0455)    ED Course  I have reviewed the triage vital signs and the nursing notes.  Pertinent labs & imaging results that were available during my care of the patient were reviewed by me and considered in my medical decision making (see chart for details).  Clinical Course as of 11/12/20 0957  Nancy Fetter Nov 12, 6273  4263 80 year old female with left knee pain as above.  Imaging of the left leg is unremarkable.  Doppler study is negative for DVT. Discussed results with patient.  Offered short course of oxycodone to take in addition to her Tylenol and meloxicam as previously directed.  Recommend she follow-up with her sports medicine provider.  All questions answered. [LM]    Clinical Course User Index [LM] Roque Lias   MDM Rules/Calculators/A&P                           Final Clinical Impression(s) / ED Diagnoses Final diagnoses:  Chronic pain of left knee    Rx / DC Orders ED Discharge Orders          Ordered    oxyCODONE (ROXICODONE) 5 MG immediate release tablet  Every 4 hours PRN        11/12/20 0847             Tacy Learn, PA-C 11/12/20 0957    Regan Lemming, MD 11/12/20 [phone]

## 2020-11-12 NOTE — Progress Notes (Addendum)
VASCULAR LAB    Left lower extremity venous duplex has been performed.  See CV proc for preliminary results.  Messaged results to Suella Broad, PA-C and Glade Nurse, RN via secure chat  Sharion Dove, RVT 11/12/2020, 8:03 AM

## 2020-11-12 NOTE — Discharge Instructions (Addendum)
Follow-up with your orthopedist.  Take meloxicam daily as prescribed. Take Tylenol every 8 hours as directed. Take oxycodone for pain every 4 hours as needed for pain not controlled with the meloxicam and Tylenol.  If you would like to take Voltaren, replace the meloxicam with Voltaren.  Discontinue use of the meloxicam, apply Voltaren topically as directed.  Continue with Tylenol and oxycodone as directed above.

## 2020-11-12 NOTE — ED Provider Notes (Signed)
Emergency Medicine Provider Triage Evaluation Note  Lacey Brown , a 80 y.o. female  was evaluated in triage.  Pt complains of left leg pain.  [name] been having left knee pain for over a week.  She was seen by sports medicine and had an intra-articular injection, but pain worsened.  She states that her orthopedist was concerned she may be having a reaction to the medication.  She also adds that she has been having left lower leg pain.  No left ankle pain, but sometimes the pain will radiate to her left hip due to pain with walking in her left knee.  No fever, chills, rashes, red streaking, warmth, or recent falls or injuries.  Review of Systems  Positive: Arthralgias, myalgias Negative: Fever, chills, rash, red streaking, warmth, back pain  Physical Exam  BP (!) 177/104   Pulse 92   Temp 98.1 F (36.7 C) (Oral)   Resp 16   Ht 4\' 11"  (1.499 m)   Wt 76.2 kg   SpO2 98%   BMI 33.93 kg/m  Gen:   Awake, no distress   Resp:  Normal effort  MSK:   Moves extremities without difficulty  Other:  Full active and passive range of motion of the left knee.  No focal tenderness palpation to the left knee.  There is a small erythematous maculopapular lesion noted to the left the lateral knee.  No erythema, warmth, or edema.  She also has focal tenderness palpation to the mid left tibia.  No calf tenderness.  Muscular compartments are soft.  Normal exam of the left ankle and hip.  Spine is nontender.  Medical Decision Making  Medically screening exam initiated at 3:44 AM.  Appropriate orders placed.  Dellia Cloud was informed that the remainder of the evaluation will be completed by another provider, this initial triage assessment does not replace that evaluation, and the importance of remaining in the ED until their evaluation is complete.  Imaging has been ordered.  Pain medication has been given.  She will require further work-up and evaluation in the emergency department.    Joline Maxcy A, PA-C 11/12/20 0345    Orpah Greek, MD 11/12/20 [phone]

## 2020-11-12 NOTE — ED Triage Notes (Signed)
Pt followed by sports medicine for left knee pain. Was given a steroid shot 2 weeks ago with some relief of pain. Pain has now returned and pt is experiencing difficulty with ambulation. Has been prescribed tylenol, lidocaine patches, and meloxicam without relief.

## 2020-11-13 NOTE — Telephone Encounter (Signed)
Called patient and was advised that she was in so much pain she ended up at the [facility] ER Sunday night. Patient stated that she wanted to let Dr. Lorelei Pont know about her trip to the ER. Patient wants to make sure that he is aware of this before her appointment next week. Patient requested that this message go back to Dr. Lorelei Pont in case he has any other ideas for her to do before her appointment next week. Patient is aware that Dr. Lorelei Pont is out of the office this week.

## 2020-11-13 NOTE — Telephone Encounter (Signed)
Pt called in requesting a call back . Regarding concern about knee pain . [phone]

## 2020-11-14 NOTE — Telephone Encounter (Signed)
Can you call her back.  [place] office:  can you please move her appointment up to Monday.  For [facility] or the CMA covering Dr. B today. I have read all of the ER notes, too.  All of their work-up including all x-rays and her leg ultrasound were normal, and it looks like they were reassured that nothing severe is going on with her knee.  Keep up the tylenol, anti-inflammatories 3 times a day, continue icing at least twice a day.Marland Kitchen  Sparing use of pain medication is ok.

## 2020-11-14 NOTE — Telephone Encounter (Signed)
Patient notified as instructed by telephone and verbalized understanding.  Patient stated that she will see Dr. Lorelei Pont Monday for her appointment.

## 2020-11-14 NOTE — Telephone Encounter (Signed)
CALLED PAT AND SCHEDULED FOR 10/17 AT 3:20 PM

## 2020-11-15 ENCOUNTER — Telehealth: Payer: Self-pay | Admitting: Family Medicine

## 2020-11-15 ENCOUNTER — Other Ambulatory Visit: Payer: Self-pay | Admitting: *Deleted

## 2020-11-15 ENCOUNTER — Telehealth: Payer: Self-pay | Admitting: Internal Medicine

## 2020-11-15 NOTE — Telephone Encounter (Signed)
Patient calling in can states she saw Dr Edilia Bo for an acute visit and then went to the ED 11/12/20.   Patient was given oxyCODONE (ROXICODONE) 5 MG immediate release tablet. Patient still having pain in the left knee down into the lower leg. No swelling or discoloration. Patient has been using ice packs as well. The pain is rated 10/10, no better.   Patient would like a refill of the Oxycodone until she can be seen for her scheduled appointment Monday  11/20/20. [facility] [address] [facility] [employer].   Please advise

## 2020-11-15 NOTE — Telephone Encounter (Signed)
Patient called stating that she is almost out of her pain medication and needs some to last her until she sees Dr. Lorelei Pont Monday. Patient is aware that Dr. Lorelei Pont is out of the office. Patient was advised that this message will be sent to Dr. Lorelei Pont and her PCP. Patient stated that she is trying to take it only as needed. [facility]

## 2020-11-15 NOTE — Telephone Encounter (Signed)
  Encourage patient to contact the pharmacy for refills or they can request refills through [place]:  Please schedule appointment if longer than 1 year  NEXT APPOINTMENT DATE:11/20/20  MEDICATION:oxyCODONE (ROXICODONE) 5 MG immediate release tablet  Is the patient out of medication? 2 pills left  [id number] [address], [place] [facility] [address]  Let patient know to contact pharmacy at the end of the day to make sure medication is ready.  Please notify patient to allow 48-72 hours to process  CLINICAL FILLS OUT ALL BELOW:   LAST REFILL:  QTY:  REFILL DATE:    OTHER COMMENTS:    Okay for refill?  Please advise

## 2020-11-15 NOTE — Telephone Encounter (Signed)
Refill history via [place] Controlled Substance databas, accessed by me today. Shows no oxycodone prescribed this entire year until October 9th.  I am not comfortable refilling . I only saw her for hypertension follow up.

## 2020-11-16 DIAGNOSIS — M13862 Other specified arthritis, left knee: Secondary | ICD-10-CM | POA: Diagnosis not present

## 2020-11-16 DIAGNOSIS — M25562 Pain in left knee: Secondary | ICD-10-CM | POA: Diagnosis not present

## 2020-11-16 MED ORDER — OXYCODONE HCL 5 MG PO TABS: 5.0000 mg | ORAL_TABLET | Freq: Four times a day (QID) | ORAL | 0 refills | Status: DC | PRN

## 2020-11-16 NOTE — Addendum Note (Signed)
Addended by: Owens Loffler on: 11/16/2020 08:16 AM   Modules accepted: Orders

## 2020-11-16 NOTE — Telephone Encounter (Signed)
Called and spoke with the patient, and she was informed of Dr. Lillie Fragmin recommendations. Patient stated that that her primary care doctor did stated not to take ibuprofen because of her Blood pressure. Patient stated that that she has an appointment with Dr. Lorelei Pont on Monday afternoon.

## 2020-11-16 NOTE — Telephone Encounter (Signed)
Can you call, as well  I refilled her pain medication.  I would still like for her to take Tylenol 2 extra strength tablets 3 times a day.  (500 mg tablets)  Also, be sure to take 4 over the counter Ibuprofen tablets 3 times a day (200 mg tablets)

## 2020-11-16 NOTE — Telephone Encounter (Signed)
Lacey Brown, I will continue to follow this case / patient.  Apologies that this was sent to you.  I just wrote a long note about her (and others), and she has had reassuring Ortho and ED evals within the last couple of weeks.

## 2020-11-16 NOTE — Telephone Encounter (Signed)
This is an unusual case.  Her pain seems to have escalated without any clear reason or event.  Intervally since she saw me, she has seen Orthopedics and the emergency room.  Both evaluating MD's did not have major concerns about this case and patient.  Subsequent imaging including x-rays and ultrasound have all been normal.  This seems reasonable to refill oxycodone until she sees me for a follow-up.  She has also been advised about proper dosing of tylenol and NSAIDS, but there has been some confusion about dosing after relaying this information a couple of times.   Meds ordered this encounter  Medications   oxyCODONE (ROXICODONE) 5 MG immediate release tablet    Sig: Take 1 tablet (5 mg total) by mouth every 6 (six) hours as needed for severe pain.    Dispense:  20 tablet    Refill:  0

## 2020-11-19 ENCOUNTER — Other Ambulatory Visit: Payer: Self-pay | Admitting: Family

## 2020-11-20 ENCOUNTER — Other Ambulatory Visit: Payer: Self-pay

## 2020-11-20 ENCOUNTER — Encounter: Payer: Self-pay | Admitting: Family Medicine

## 2020-11-20 ENCOUNTER — Ambulatory Visit (INDEPENDENT_AMBULATORY_CARE_PROVIDER_SITE_OTHER): Payer: Medicare HMO | Admitting: Family Medicine

## 2020-11-20 VITALS — BP 170/70 | HR 89 | Temp 98.1°F | Ht 59.0 in

## 2020-11-20 DIAGNOSIS — R2689 Other abnormalities of gait and mobility: Secondary | ICD-10-CM | POA: Diagnosis not present

## 2020-11-20 DIAGNOSIS — M810 Age-related osteoporosis without current pathological fracture: Secondary | ICD-10-CM

## 2020-11-20 DIAGNOSIS — M1712 Unilateral primary osteoarthritis, left knee: Secondary | ICD-10-CM | POA: Diagnosis not present

## 2020-11-20 DIAGNOSIS — M25562 Pain in left knee: Secondary | ICD-10-CM

## 2020-11-20 MED ORDER — OXYCODONE HCL 5 MG PO TABS: 5.0000 mg | ORAL_TABLET | Freq: Four times a day (QID) | ORAL | 0 refills | Status: DC | PRN

## 2020-11-20 NOTE — Progress Notes (Signed)
Lacey Brown T. Lacey Biehl, MD, [place] at [facility] [place], [address]  Phone: [phone]  FAX: [phone]  Lacey Brown - 80 y.o. female  MRN [id number]  Date of Birth: 06/28/1940  Date: 11/20/2020  PCP: Crecencio Mc, MD  Referral: Crecencio Mc, MD  Chief Complaint  Patient presents with  . Follow-up    Left Knee Pain    This visit occurred during the SARS-CoV-2 public health emergency.  Safety protocols were in place, including screening questions prior to the visit, additional usage of staff PPE, and extensive cleaning of exam room while observing appropriate contact time as indicated for disinfecting solutions.   Subjective:   Lacey Brown is a 80 y.o. very pleasant female patient with Body mass index is 33.93 kg/m. who presents with the following:  L knee pain I saw her on 10/30/2020, intervally since I saw her I spoke to her on the phone, and she has communicated with [place] a few times about knee. Pain she went to the ER and has also been seen by orthopedic surgery.  She describes 10/10 pain.  At that time I felt like she had an arthritis flare, and recommended that she continue taking NSAIDs, Tylenol, and also did a intra-articular Kenalog injection.  3 days after this she started to have some significant and severe pain.  She denies any warmth, redness, or fever.  Additional x-rays and a doppler ultrasound were all normal. ER notes some lateral joint line tenderness, but no other significant findings.  The did give her some Oxycodone at the time of her discharge.  She saw Orthopedics on 11/06/2020  Initially, she was placed on a steroid dose-pak Ultimately, she also went back on 11/11/2020. She went to the ER on 11/12/2020 It looks like she also saw Orthopedics on 11/16/2020.    She is here today for reassessment by me in the office.  She is accompanied by a friend who provides additional  history.  She is currently in a wheelchair, the last time I saw her she was ambulating without any kind of assistance.  Feels terrible and in severe, recalcitrant pain. While she does have some buttocks pain, she does not have any radicular, numbness, or tingling down into her leg today.  She has had a history of being worked up for some lumbosacral spine pain by different physician.  Pain down in the leg as leg.  This is more lateral.  Having a lot of pain even with weight bearing.  This is been ongoing now for greater than 2 weeks.  No trauma or injury.  X-ray sets x3 have been negative. Ultrasound, no DVT.  11/15/2020 Last OV with Owens Loffler, MD  I have seen the patient before for OA, most recently 07/20/2020:  At that point I gave her some Mobic and did a kenalog injection of her left knee.   Knee does not feel all that great.  Some pain with motion and with getting up and around and rotational movements at the knee.  Some pain going up and down stairs and rising from a seated position.  She does have some pain anteriorly as well as laterally.  Her motion is fairly good, and she is not having any significant effusion.  She is not having any mechanical locking up of the joint, and she is not having any symptomatic giving way.  She has no significant history of prior operative intervention.  I did review her March 2021 x-rays, and she does have minimal degenerative change.  Was not noted medial cortical irregularity that was not tender on palpation.    Review of Systems is noted in the HPI, as appropriate   Patient Active Problem List   Diagnosis Date Noted  . Aortic atherosclerosis ([place]) 08/02/2020  . Osteoarthritis of knees, bilateral 08/02/2020  . Osteoporosis 08/02/2020  . Arthritis 10/24/2014  . HTN (hypertension) 10/24/2014  . Asthma 10/24/2014  . GERD (gastroesophageal reflux disease) 10/24/2014    Past Medical History:  Diagnosis Date  . Arthritis    back,  neck; right shoulder;   . Asthma    using advair prn  . Chicken pox   . Hyperlipidemia   . Hypertension    controlled with medication;     Past Surgical History:  Procedure Laterality Date  . BREAST EXCISIONAL BIOPSY Left   . TONSILLECTOMY AND ADENOIDECTOMY      Family History  Problem Relation Age of Onset  . Heart disease Mother   . Heart disease Father   . Arthritis Maternal Grandmother   . Diabetes Neg Hx   . Cancer Neg Hx   . Stroke Neg Hx      Objective:   BP (!) 170/70   Pulse 89   Temp 98.1 F (36.7 C) (Temporal)   Ht 4\' 11"  (1.499 m)   SpO2 97%   BMI 33.93 kg/m   She is implicating with some severe difficulty bearing weight, and she is here in the office in a wheelchair.   Knee: Left Gait: Markedly antalgic ROM: Extension to 0 and flexion to 120 Effusion: neg Echymosis or edema: none Patellar tendon NT Painful PLICA: neg Patellar grind: negative Medial and lateral patellar facet loading: negative medial and lateral joint lines: Lateral radial and the medial joint line tenderness She does have some tenderness at the tibial plateau Mcmurray's neg Flexion-pinch pain Varus and valgus stress: stable Lachman: neg Ant and Post drawer: neg Hip abduction, IR, ER: WNL Hip flexion str: 5/5 Hip abd: 5/5 Quad: 5/5 VMO atrophy:No Hamstring concentric and eccentric: 5/5   Radiology: DG Tibia/Fibula Left  Result Date: 11/12/2020 CLINICAL DATA:  80 year old female with left lower extremity pain for 2 weeks. Pain from the anterior patella radiating distally. EXAM: LEFT TIBIA AND FIBULA - 2 VIEW COMPARISON:  Left knee series today. Left tib fib series 04/26/2019. FINDINGS: Bone mineralization is within normal limits for age. Preserved alignment at the left knee and ankle. Mild chronic vascular calcifications. There is no evidence of fracture or other focal bone lesions. IMPRESSION: Negative. Electronically Signed   By: Genevie Ann M.D.   On: 11/12/2020 04:54   DG  Knee 4 Views W/Patella Left  Result Date: 11/01/2020 CLINICAL DATA:  Follow-up knee pain, AO chest EXAM: LEFT KNEE - COMPLETE 4+ VIEW COMPARISON:  X-ray left tibia fibula 04/26/2019 FINDINGS: No evidence of fracture, dislocation, or joint effusion. Mild medial tibiofemoral and patellofemoral compartment joint space narrowing. No evidence of severe arthropathy. No aggressive appearing focal bone abnormality. Soft tissues are unremarkable. Vascular calcifications. IMPRESSION: No acute displaced fracture or dislocation. Electronically Signed   By: Iven Finn M.D.   On: 11/01/2020 00:02   DG Knee Complete 4 Views Left  Result Date: 11/12/2020 CLINICAL DATA:  80 year old female with left lower extremity pain for 2 weeks. Pain from the anterior patella radiating distally. EXAM: LEFT KNEE - COMPLETE 4+ VIEW COMPARISON:  Left knee series 10/30/2020. FINDINGS: Bone mineralization is  within normal limits for age. Normal joint spaces and alignment for age. No joint effusion. Intact patella. No acute osseous abnormality identified. Mild calcified peripheral vascular disease. IMPRESSION: Negative for age radiographic appearance of the left knee. Electronically Signed   By: Genevie Ann M.D.   On: 11/12/2020 04:53   VAS [place] LOWER EXTREMITY VENOUS (DVT) (7a-7p)  Result Date: 11/13/2020  Lower Venous DVT Study Patient Name:  Lacey Brown  Date of Exam:   11/12/2020 Medical Rec #: [id number]             Accession #:    [id number] Date of Birth: 08/13/1940             Patient Gender: F Patient Age:   89 years Exam Location:  [facility] Procedure:      VAS [place] LOWER EXTREMITY VENOUS (DVT) Referring Phys: Mickel Baas MURPHY --------------------------------------------------------------------------------  Indications: Knee, lateral thigh, and buttock pain.  Limitations: Patient could not be still, constantly sitting up and moving left leg. Comparison Study: No prior study on file Performing Technologist: Sharion Dove  RVS  Examination Guidelines: A complete evaluation includes B-mode imaging, spectral Doppler, color Doppler, and power Doppler as needed of all accessible portions of each vessel. Bilateral testing is considered an integral part of a complete examination. Limited examinations for reoccurring indications may be performed as noted. The reflux portion of the exam is performed with the patient in reverse Trendelenburg.  +-----+---------------+---------+-----------+----------+--------------+ RIGHTCompressibilityPhasicitySpontaneityPropertiesThrombus Aging +-----+---------------+---------+-----------+----------+--------------+ CFV  Full           Yes      Yes                                 +-----+---------------+---------+-----------+----------+--------------+   +---------+---------------+---------+-----------+----------+--------------+ LEFT     CompressibilityPhasicitySpontaneityPropertiesThrombus Aging +---------+---------------+---------+-----------+----------+--------------+ CFV      Full           Yes      Yes                                 +---------+---------------+---------+-----------+----------+--------------+ SFJ      Full                                                        +---------+---------------+---------+-----------+----------+--------------+ FV Prox  Full                                                        +---------+---------------+---------+-----------+----------+--------------+ FV Mid   Full                                                        +---------+---------------+---------+-----------+----------+--------------+ FV DistalFull                                                        +---------+---------------+---------+-----------+----------+--------------+  PFV      Full                                                        +---------+---------------+---------+-----------+----------+--------------+ POP      Full                                                         +---------+---------------+---------+-----------+----------+--------------+ PTV      Full                                                        +---------+---------------+---------+-----------+----------+--------------+ PERO     Full                                                        +---------+---------------+---------+-----------+----------+--------------+ could not Doppler popliteal vein as patient could not keep leg still    Summary: RIGHT: - No evidence of common femoral vein obstruction.  LEFT: - There is no evidence of deep vein thrombosis in the lower extremity.  *See table(s) above for measurements and observations. Electronically signed by Orlie Pollen on 11/13/2020 at 6:51:15 AM.    Final     Assessment and Plan:     ICD-10-CM   1. Acute pain of left knee  M25.562 MR Knee Left  Wo Contrast    2. Unable to bear weight on left lower extremity  R26.89 MR Knee Left  Wo Contrast    3. Age-related osteoporosis without current pathological fracture  M81.0     4. Primary osteoarthritis of left knee  M17.12      This is a very unusual case.  The patient has severe recalcitrant pain that she describes a 10 out of 10 and she has normal range of motion of the knee.  She does have some medial and lateral joint line tenderness.  She is now in a wheelchair and a walker compared to normal activity and walking at baseline, and she is minimally able to bear weight.  She did have some pain 3 days after a Kenalog injection, but this pain at the degree and length of time would essentially be impossible for a postinflammatory flare.  She has never had any redness or warmth, and none of the other 4 physicians who evaluated her felt like she had potential septic arthritis.  Obtain an MRI urgently of the left knee with suspicion of an acute occult fracture in an 80 year old osteoporotic patient.  For now, continue with Tylenol, meloxicam, and  oxycodone if needed.  Right now, she has had a relatively high need for an opioids.    If her entire work-up is negative, then potential sources of other pain would include the lumbar spine.  Meds ordered this encounter  Medications  . oxyCODONE (ROXICODONE) 5 MG immediate release tablet  Sig: Take 1 tablet (5 mg total) by mouth every 6 (six) hours as needed for severe pain.    Dispense:  40 tablet    Refill:  0   Medications Discontinued During This Encounter  Medication Reason  . oxyCODONE (ROXICODONE) 5 MG immediate release tablet Reorder   Orders Placed This Encounter  Procedures  . MR Knee Left  Wo Contrast    Follow-up: No follow-ups on file.  [employer] One speech-to-text software was used for transcription in this dictation.  Possible transcriptional errors can occur using Editor, commissioning.   Signed,  Maud Deed. Lauralie Blacksher, MD   Outpatient Encounter Medications as of 11/20/2020  Medication Sig  . acetaminophen (TYLENOL) 325 MG tablet Take 650 mg by mouth every 6 (six) hours as needed.  Marland Kitchen albuterol (VENTOLIN HFA) 108 (90 Base) MCG/ACT inhaler Inhale 2 puffs into the lungs every 6 (six) hours as needed for wheezing or shortness of breath.  . Calcium Carbonate-Vit D-Min (CALCIUM 1200 PO) Take 1 tablet by mouth daily.  . Cholecalciferol (VITAMIN D3) 2000 UNITS TABS Take 1 tablet by mouth daily.  . Fluticasone-Salmeterol (ADVAIR DISKUS) 250-50 MCG/DOSE AEPB TAKE 1 PUFF BY MOUTH TWICE A DAY **RINSE MOUTH AFTER USE**  . ibuprofen (ADVIL,MOTRIN) 400 MG tablet Take 400 mg by mouth daily as needed.   . metoprolol succinate (TOPROL-XL) 25 MG 24 hr tablet TAKE 1 TABLET BY MOUTH EVERY DAY  . Omega-3 Fatty Acids (FISH OIL) 1000 MG CAPS Take 1 capsule by mouth daily.  Marland Kitchen omeprazole (PRILOSEC OTC) 20 MG tablet Take 20 mg by mouth as needed.   . prednisoLONE acetate (PRED FORTE) 1 % ophthalmic suspension   . telmisartan (MICARDIS) 80 MG tablet Take 1 tablet (80 mg total) by mouth  daily.  . [DISCONTINUED] oxyCODONE (ROXICODONE) 5 MG immediate release tablet Take 1 tablet (5 mg total) by mouth every 6 (six) hours as needed for severe pain.  Marland Kitchen oxyCODONE (ROXICODONE) 5 MG immediate release tablet Take 1 tablet (5 mg total) by mouth every 6 (six) hours as needed for severe pain.   No facility-administered encounter medications on file as of 11/20/2020.

## 2020-11-21 ENCOUNTER — Telehealth: Payer: Self-pay | Admitting: Family Medicine

## 2020-11-21 MED ORDER — CYCLOBENZAPRINE HCL 5 MG PO TABS: ORAL_TABLET | ORAL | 0 refills | Status: DC

## 2020-11-21 NOTE — Telephone Encounter (Addendum)
Pt called in stating that [facility] scheduled her for an MRI and pt needs an muscle relaxer to do MRI [facility] [address]

## 2020-11-21 NOTE — Telephone Encounter (Signed)
Lacey Brown notified by telephone that Rx for muscle relaxant has been sent to [facility] [address].  Advised to take it 30 minutes prior to her MRI.  Patient states understanding.

## 2020-11-21 NOTE — Telephone Encounter (Signed)
Received PA for cyclobenzaprine.  PA completed on CoverMyMeds and sent for review.

## 2020-11-21 NOTE — Telephone Encounter (Signed)
See my notes and the orthopedics and ER notes x 4 other providers.    This is not unreasonable before her knee MRI.  I sent her in Flexeril 5 mg, 1 dose.    Meds ordered this encounter  Medications   cyclobenzaprine (FLEXERIL) 5 MG tablet    Sig: 1 tab po 30 minutes before MRI    Dispense:  1 tablet    Refill:  0

## 2020-11-22 ENCOUNTER — Telehealth: Payer: Self-pay | Admitting: Family Medicine

## 2020-11-22 ENCOUNTER — Ambulatory Visit
Admission: RE | Admit: 2020-11-22 | Discharge: 2020-11-22 | Disposition: A | Discharge status: Home / Self Care | Payer: Medicare HMO | Source: Ambulatory Visit | Attending: Family Medicine | Admitting: Family Medicine

## 2020-11-22 ENCOUNTER — Ambulatory Visit: Payer: Medicare HMO | Admitting: Family Medicine

## 2020-11-22 ENCOUNTER — Other Ambulatory Visit: Payer: Self-pay

## 2020-11-22 DIAGNOSIS — R600 Localized edema: Secondary | ICD-10-CM | POA: Diagnosis not present

## 2020-11-22 DIAGNOSIS — R2689 Other abnormalities of gait and mobility: Secondary | ICD-10-CM

## 2020-11-22 DIAGNOSIS — M25562 Pain in left knee: Secondary | ICD-10-CM

## 2020-11-22 DIAGNOSIS — M25462 Effusion, left knee: Secondary | ICD-10-CM | POA: Diagnosis not present

## 2020-11-22 DIAGNOSIS — M6740 Ganglion, unspecified site: Secondary | ICD-10-CM

## 2020-11-22 IMAGING — MR MR KNEE*L* W/O CM
6 series · 40 of 40 positions shown · non-contrast
Comparison: X-ray [DATE]

CLINICAL DATA: Knee pain, stress fracture suspected, neg xray
occult fracture suspected, severe recalcitrant pain and unable to
walk. Anterior knee pain. Fall 4 weeks ago

EXAM:
MRI OF THE LEFT KNEE WITHOUT CONTRAST
TECHNIQUE: Multiplanar, multisequence MR imaging of the knee was performed. No
intravenous contrast was administered.

[Series 7: T2 fat-sat · coronal · left · 4.0mm · 0.47mm/px · 5 of 24 slices shown (1 of 3)]
[im 1/24]
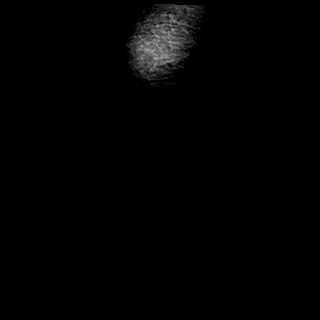
[im 6/24]
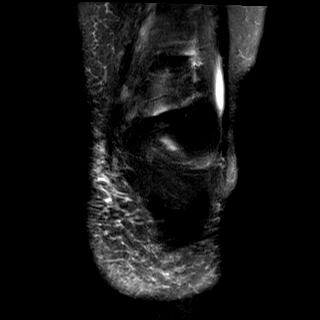
[im 12/24]
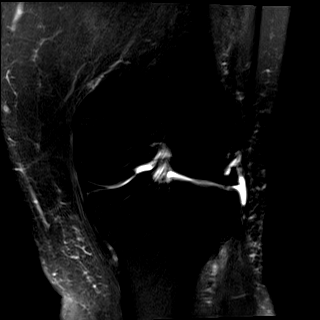
[im 18/24]
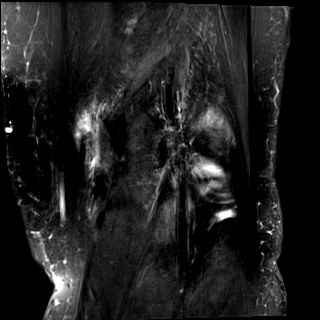
[im 24/24]
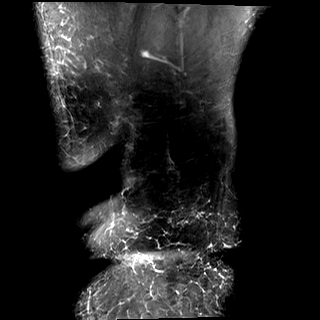

[Series 8: T1 · coronal · left · 4.0mm · 0.47mm/px · 5 of 24 slices shown]
[im 1/24]
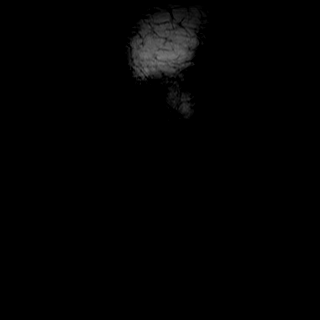
[im 6/24]
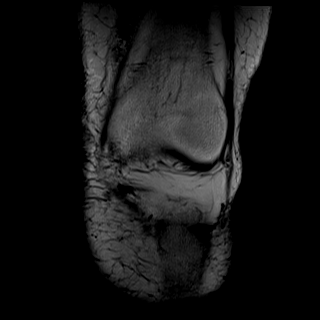
[im 12/24]
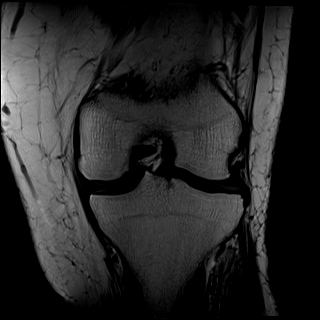
[im 18/24]
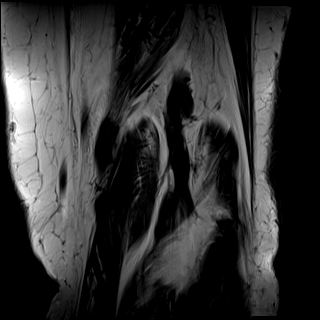
[im 24/24]
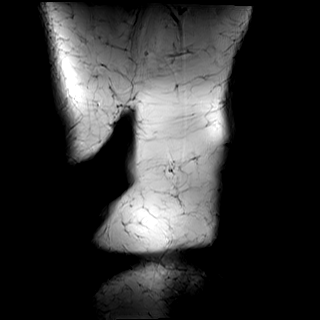

[Series 9: T2 fat-sat · axial · left · 4.0mm · 0.50mm/px · z∈[-81,+72]mm · 9 of 36 slices shown (2 of 3)]
[im 1/36]
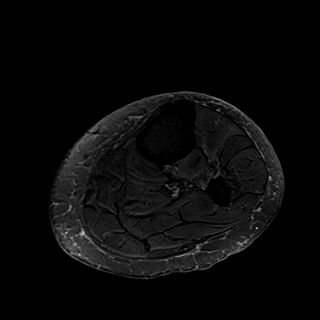
[im 5/36]
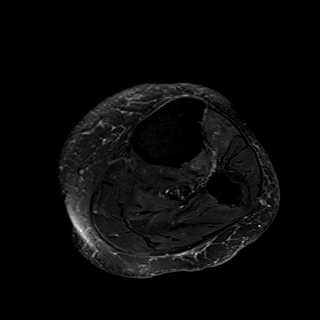
[im 9/36]
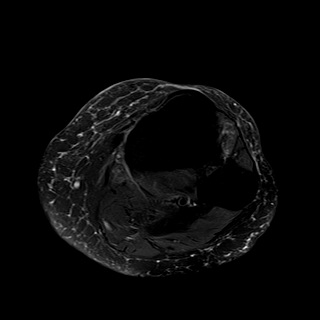
[im 14/36]
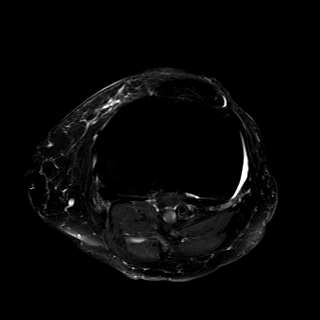
[im 18/36]
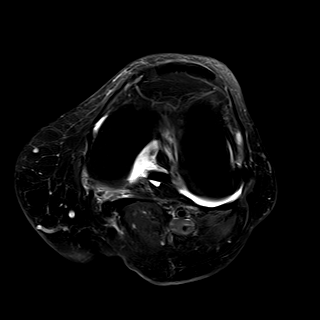
[im 22/36]
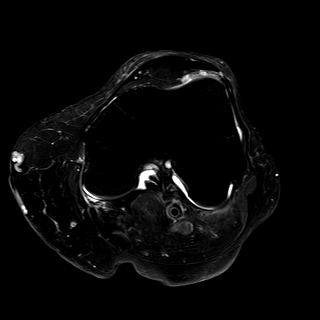
[im 27/36]
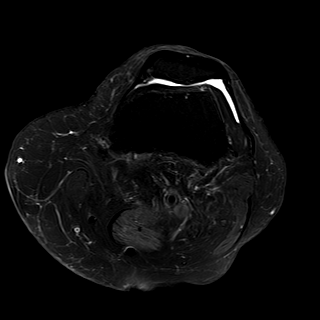
[im 31/36]
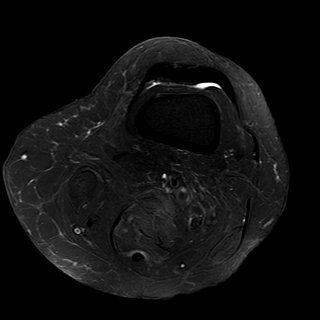
[im 36/36]
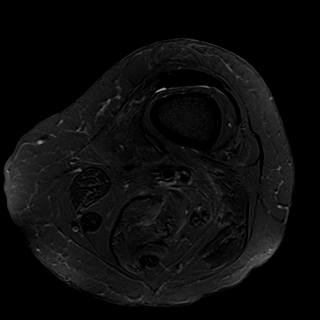

[Series 10: PD fat-sat · coronal · left · 3.0mm · 0.47mm/px · 7 of 28 slices shown (1 of 2)]
[im 1/28]
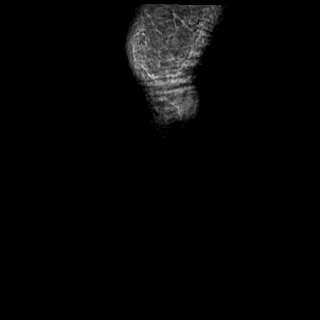
[im 5/28]
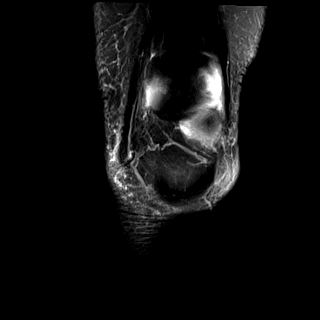
[im 10/28]
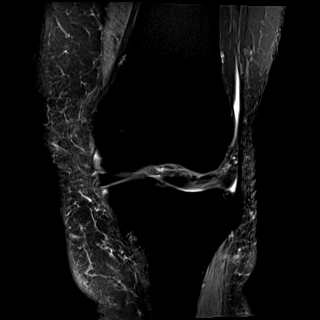
[im 14/28]
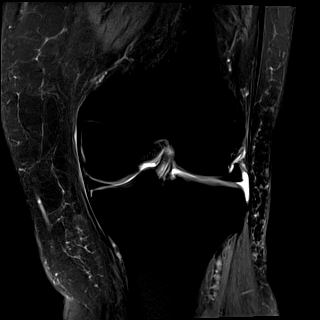
[im 19/28]
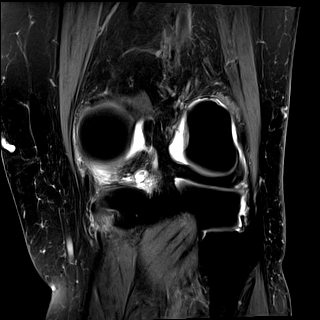
[im 23/28]
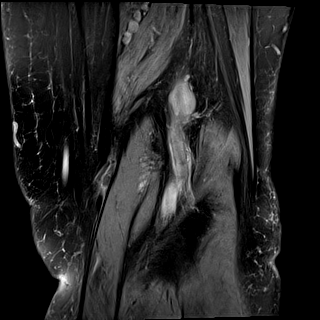
[im 28/28]
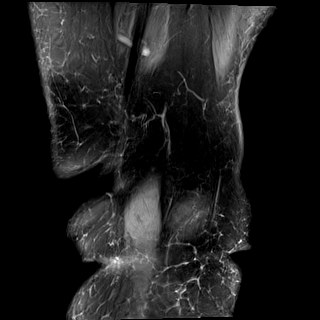

[Series 11: PD fat-sat · sagittal · left · 3.0mm · 0.47mm/px · 7 of 27 slices shown (2 of 2)]
[im 1/27]
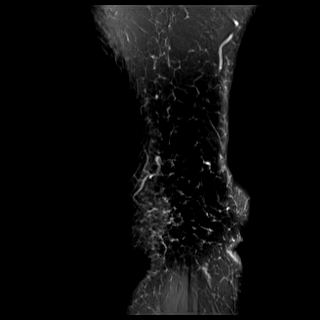
[im 5/27]
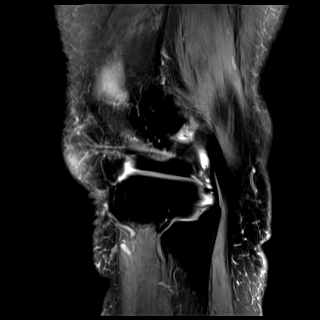
[im 9/27]
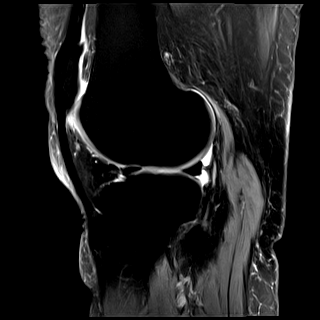
[im 14/27]
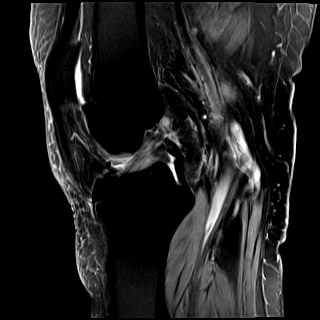
[im 18/27]
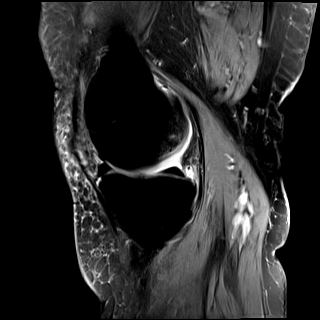
[im 22/27]
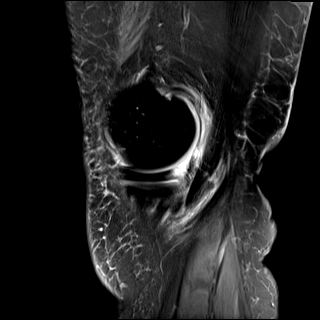
[im 27/27]
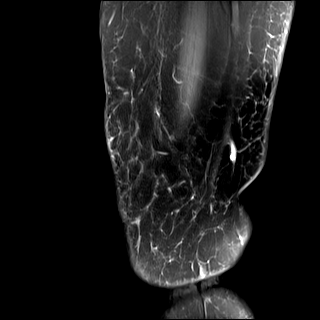

[Series 12: T2 fat-sat · sagittal · left · 3.0mm · 0.47mm/px · 7 of 27 slices shown (3 of 3)]
[im 1/27]
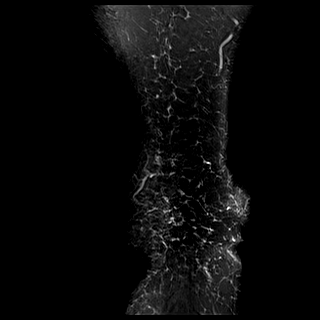
[im 5/27]
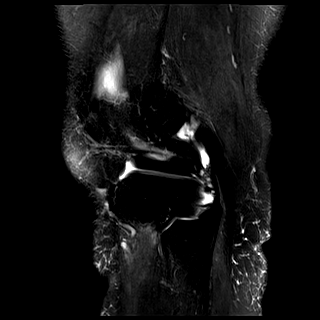
[im 9/27]
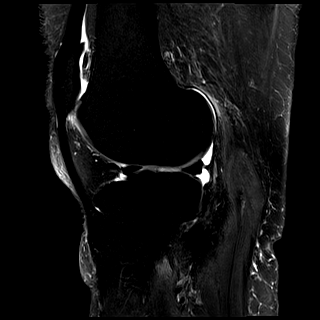
[im 14/27]
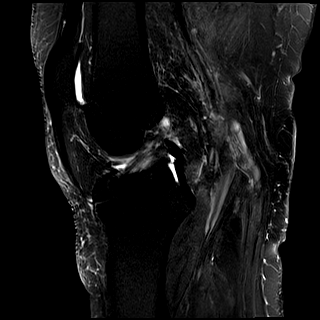
[im 18/27]
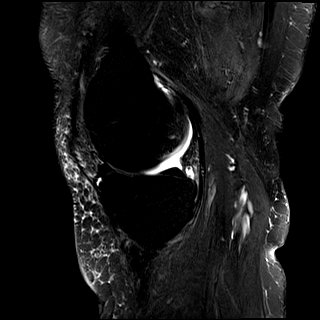
[im 22/27]
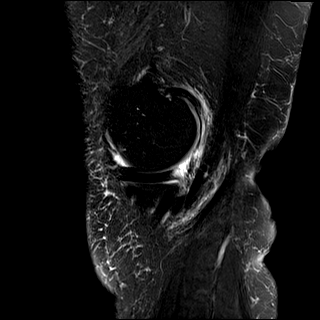
[im 27/27]
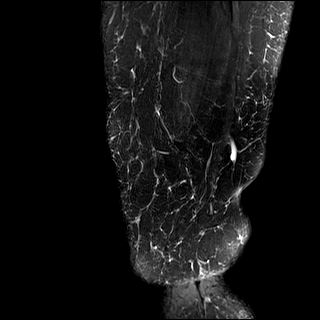

[40 of 40 positions shown; findings below may reference images not displayed]

FINDINGS: MENISCI

Medial meniscus:  Intact.

Lateral meniscus:  Intact.

LIGAMENTS

Cruciates:  Intact ACL and PCL.

Collaterals: Medial collateral ligament is intact. Lateral
collateral ligament complex is intact.

CARTILAGE

Patellofemoral: Mild diffuse thinning of the patellar and trochlear
cartilage.

Medial:  No chondral defect.

Lateral: Mild chondral surface irregularity of the lateral tibial
plateau.

Joint: Trace knee joint effusion. Complex cystic structure at the
posterior joint line near the PCL attachment site measuring 15 x 3 x
10 mm, likely ganglion cyst. Fat pads within normal limits.

Popliteal Fossa:  No Baker cyst. Intact popliteus tendon.

Extensor Mechanism:  Intact quadriceps tendon and patellar tendon.

Bones: No focal marrow signal abnormality. No fracture or
dislocation.

Other: Mild diffuse subcutaneous edema, nonspecific.
IMPRESSION: 1. No acute osseous abnormality or evidence of internal derangement
of the left knee.
2. Trace knee joint effusion.
3. Complex cystic structure at the posterior joint line near the PCL
attachment site measuring 15 x 3 x 10 mm, likely ganglion cyst.

## 2020-11-22 MED ORDER — PREDNISONE 20 MG PO TABS: ORAL_TABLET | ORAL | 0 refills | Status: DC

## 2020-11-22 NOTE — Telephone Encounter (Signed)
Can you call:  Her knee MRI looks very reassuring.  No fractures. All ligaments are fine.  Cartilage is fine. Less arthritis than most 80 year old patients.  The only thing abnormal is a fairly large cyst in the knee.  This is the only thing in her knee that could be causing pain.  I sent in 14 days of Prednisone for her.  I think that she should see one of the Orthopedic Surgeons for their opinion.  The 4 other providers and I do not have a solution.    ICD-10-CM   1. Acute pain of left knee  M25.562 Ambulatory referral to Orthopedic Surgery    2. Unable to bear weight on left lower extremity  R26.89 Ambulatory referral to Orthopedic Surgery    3. Ganglion cyst  M67.40 Ambulatory referral to Orthopedic Surgery     Meds ordered this encounter  Medications   predniSONE (DELTASONE) 20 MG tablet    Sig: 2 tabs po for 7 days, then 1 tab po for 7 days    Dispense:  21 tablet    Refill:  0   Orders Placed This Encounter  Procedures   Ambulatory referral to Orthopedic Surgery

## 2020-11-22 NOTE — Telephone Encounter (Signed)
Pt wants to be called by provider or assistant today. Pt want the readings

## 2020-11-23 NOTE — Telephone Encounter (Signed)
Ms. Toppins notified as instructed by telephone.  She is agreeable with Ortho Referral.  She prefers [facility].  She prefers Dr. Rush Farmer but would like to get in with whoever can see her first due to she is in a lot of pain.

## 2020-11-23 NOTE — Telephone Encounter (Signed)
Pt is calling about results of exam. Pt wants to be called

## 2020-11-29 ENCOUNTER — Ambulatory Visit: Payer: Medicare HMO | Admitting: Orthopaedic Surgery

## 2020-11-29 DIAGNOSIS — M25562 Pain in left knee: Secondary | ICD-10-CM | POA: Diagnosis not present

## 2020-11-29 MED ORDER — LIDOCAINE HCL 1 % IJ SOLN
3.0000 mL | INTRAMUSCULAR | Status: AC | PRN
  Administered 2020-11-29: 3 mL

## 2020-11-29 MED ORDER — METHYLPREDNISOLONE ACETATE 40 MG/ML IJ SUSP
40.0000 mg | INTRAMUSCULAR | Status: AC | PRN
  Administered 2020-11-29: 40 mg via INTRA_ARTICULAR

## 2020-11-29 NOTE — Progress Notes (Signed)
Office Visit Note   Patient: Lacey Brown           Date of Birth: 04/17/1940           MRN: [id number] Visit Date: 11/29/2020              Requested by: Owens Loffler, MD [place],  [place] [id number] PCP: Crecencio Mc, MD   Assessment & Plan: Visit Diagnoses:  1. Acute pain of left knee     Plan: I spent a considerable amount of time with the patient trying to explain that I would not recommend a surgery for her knee just for the ganglion cyst.  She was concerned about the ganglion cyst and was questioning whether or not we would perform surgery because of this finding in her knee.  I gave her reassurance that this is not the source of her pain and that her exam was pretty benign from my standpoint.  I did recommend trying 1 more steroid injection today but with Depo-Medrol and lidocaine and she agreed to this and tolerated easily.  Again on my exam I did not elicit any type of significant pain from her knee.  I did give her a prescription for outpatient physical therapy at [facility] physical therapy in [place] to see if this would help.  I do not see that a surgical intervention will help her knee at all or help her to walk based on what I am seeing on the radiographic studies and my clinical exam.  I am not opposed to her even getting a second opinion about this as well.  I am fine with seeing her back in 4 weeks if she like to be seen again after a course of physical therapy and seeing if this injection helps.  Follow-Up Instructions: Return in about 4 weeks (around 12/27/2020).   Orders:  Orders Placed This Encounter  Procedures   Large Joint Inj   No orders of the defined types were placed in this encounter.     Procedures: Large Joint Inj: L knee on 11/29/2020 5:14 PM Indications: diagnostic evaluation and pain Details: 1.5 in superolateral approach  Arthrogram: No  Medications: 3 mL lidocaine 1 %; 40 mg methylPREDNISolone acetate 40  MG/ML Outcome: tolerated well, no immediate complications Procedure, treatment alternatives, risks and benefits explained, specific risks discussed. Consent was given by the patient. Immediately prior to procedure a time out was called to verify the correct patient, procedure, equipment, support staff and site/side marked as required. Patient was prepped and draped in the usual sterile fashion.      Clinical Data: No additional findings.   Subjective: Chief Complaint  Patient presents with   Left Knee - Pain  The patient is a 80 year old female sent to me from Dr. Donella Stade to evaluate and treat significant left knee pain.  She states that the pain is only been recent and got worse to where she could not walk after having an injection in her left knee which looks like was with lidocaine and Kenalog.  She denies any specific injury.  She says she has not been able to walk since September on that knee due to the severity of pain with weightbearing.  A MRI was obtained the left knee that did show a ganglion cyst around the PCL and she sent to [place] due to these MRI findings.  Family is with her today in the room as well.  She points to the anterior aspect  of her knee on the left side as a source of her pain and sometimes it radiates she states to her shin.  She denies any groin pain.  Again she reiterates that she cannot walk and wants to know why that is the case.  HPI  Review of Systems There is currently listed no headache, chest pain, shortness of breath, fever, chills, nausea, vomiting  Objective: Vital Signs: There were no vitals taken for this visit.  Physical Exam She is alert and orient x3 and in no acute distress Ortho Exam Examination of her left knee shows no effusion.  Her range of motion actively and passively is full.  There is negative McMurray's and negative Lachman's exam.  The knee is not hot and there is no effusion.  Her hip exam is also normal.  I did have her push  down really hard against my hand with plantarflexion of her foot and I compressed the knee and this does not seem to elicit any type of pain at all.  She says she was just minimally tender or sore but again her exam was pretty benign from my standpoint. Specialty Comments:  No specialty comments available.  Imaging: No results found. I did review the plain films and the MRI of her left knee.  The joint space is well-maintained.  There is no deficits of the cartilage.  The meniscus are intact.  The ACL and PCL are intact.  There is no evidence of stress fracture.  I do see the small ganglion cyst at the PCL insertion site but this appears to be chronic.  PMFS History: Patient Active Problem List   Diagnosis Date Noted   Aortic atherosclerosis ([place]) 08/02/2020   Osteoarthritis of knees, bilateral 08/02/2020   Osteoporosis 08/02/2020   Arthritis 10/24/2014   HTN (hypertension) 10/24/2014   Asthma 10/24/2014   GERD (gastroesophageal reflux disease) 10/24/2014   Past Medical History:  Diagnosis Date   Arthritis    back, neck; right shoulder;    Asthma    using advair prn   Chicken pox    Hyperlipidemia    Hypertension    controlled with medication;     Family History  Problem Relation Age of Onset   Heart disease Mother    Heart disease Father    Arthritis Maternal Grandmother    Diabetes Neg Hx    Cancer Neg Hx    Stroke Neg Hx     Past Surgical History:  Procedure Laterality Date   BREAST EXCISIONAL BIOPSY Left    TONSILLECTOMY AND ADENOIDECTOMY     Social History   Occupational History   Not on file  Tobacco Use   Smoking status: Former    Types: Cigarettes    Quit date: 04/17/2009    Years since quitting: 11.6   Smokeless tobacco: Never  Vaping Use   Vaping Use: Never used  Substance and Sexual Activity   Alcohol use: Yes    Alcohol/week: 0.0 standard drinks    Comment: occasional   Drug use: No   Sexual activity: Never

## 2020-12-01 ENCOUNTER — Telehealth: Payer: Self-pay | Admitting: Internal Medicine

## 2020-12-01 NOTE — Telephone Encounter (Signed)
Patient called in to cancel her 11/1 AWVS appointment through the automated system but she is unsure if she will need this appointment due to having the [facility] nurse come to her house a few months ago.Please call her at [phone].

## 2020-12-05 ENCOUNTER — Ambulatory Visit (INDEPENDENT_AMBULATORY_CARE_PROVIDER_SITE_OTHER): Payer: Medicare HMO

## 2020-12-05 ENCOUNTER — Ambulatory Visit: Payer: Medicare HMO

## 2020-12-05 VITALS — Ht 59.0 in | Wt 168.0 lb

## 2020-12-05 DIAGNOSIS — Z0000 Encounter for general adult medical examination without abnormal findings: Secondary | ICD-10-CM

## 2020-12-05 NOTE — Progress Notes (Addendum)
Subjective:   Lacey Brown is a 80 y.o. female who presents for Medicare Annual (Subsequent) preventive examination.  Review of Systems    No ROS Cardiac Risk Factors include: hypertension;advanced age (>44men, >39 women)     Objective:    Today's Vitals   12/05/20 1401  Weight: 168 lb (76.2 kg)  Height: 4\' 11"  (1.499 m)   Body mass index is 33.93 kg/m.  Advanced Directives 12/05/2020 11/12/2020 12/09/2016  Does Patient Have a Medical Advance Directive? No No No  Would patient like information on creating a medical advance directive? No - Patient declined No - Patient declined No - Patient declined    Current Medications (verified) Outpatient Encounter Medications as of 12/05/2020  Medication Sig   acetaminophen (TYLENOL) 325 MG tablet Take 650 mg by mouth every 6 (six) hours as needed.   albuterol (VENTOLIN HFA) 108 (90 Base) MCG/ACT inhaler Inhale 2 puffs into the lungs every 6 (six) hours as needed for wheezing or shortness of breath.   Calcium Carbonate-Vit D-Min (CALCIUM 1200 PO) Take 1 tablet by mouth daily.   Cholecalciferol (VITAMIN D3) 2000 UNITS TABS Take 1 tablet by mouth daily.   cyclobenzaprine (FLEXERIL) 5 MG tablet 1 tab po 30 minutes before MRI   Fluticasone-Salmeterol (ADVAIR DISKUS) 250-50 MCG/DOSE AEPB TAKE 1 PUFF BY MOUTH TWICE A DAY **RINSE MOUTH AFTER USE**   ibuprofen (ADVIL,MOTRIN) 400 MG tablet Take 400 mg by mouth daily as needed.    metoprolol succinate (TOPROL-XL) 25 MG 24 hr tablet TAKE 1 TABLET BY MOUTH EVERY DAY   Omega-3 Fatty Acids (FISH OIL) 1000 MG CAPS Take 1 capsule by mouth daily.   omeprazole (PRILOSEC OTC) 20 MG tablet Take 20 mg by mouth as needed.    oxyCODONE (ROXICODONE) 5 MG immediate release tablet Take 1 tablet (5 mg total) by mouth every 6 (six) hours as needed for severe pain.   prednisoLONE acetate (PRED FORTE) 1 % ophthalmic suspension    predniSONE (DELTASONE) 20 MG tablet 2 tabs po for 7 days, then 1 tab po for 7 days    telmisartan (MICARDIS) 80 MG tablet Take 1 tablet (80 mg total) by mouth daily.   No facility-administered encounter medications on file as of 12/05/2020.    Allergies (verified) Ace inhibitors, Codeine, and Kenalog [triamcinolone acetonide]   History: Past Medical History:  Diagnosis Date   Arthritis    back, neck; right shoulder;    Asthma    using advair prn   Chicken pox    Hyperlipidemia    Hypertension    controlled with medication;    Past Surgical History:  Procedure Laterality Date   BREAST EXCISIONAL BIOPSY Left    TONSILLECTOMY AND ADENOIDECTOMY     Family History  Problem Relation Age of Onset   Heart disease Mother    Heart disease Father    Arthritis Maternal Grandmother    Diabetes Neg Hx    Cancer Neg Hx    Stroke Neg Hx    Social History   Socioeconomic History   Marital status: Widowed    Spouse name: Not on file   Number of children: Not on file   Years of education: Not on file   Highest education level: Not on file  Occupational History   Not on file  Tobacco Use   Smoking status: Former    Types: Cigarettes    Quit date: 04/17/2009    Years since quitting: 11.6   Smokeless tobacco: Never  Vaping  Use   Vaping Use: Never used  Substance and Sexual Activity   Alcohol use: Yes    Alcohol/week: 0.0 standard drinks    Comment: occasional   Drug use: No   Sexual activity: Never  Other Topics Concern   Not on file  Social History Narrative   Not on file   Social Determinants of Health   Financial Resource Strain: Low Risk    Difficulty of Paying Living Expenses: Not hard at all  Food Insecurity: No Food Insecurity   Worried About Charity fundraiser in the Last Year: Never true   [place] in the Last Year: Never true  Transportation Needs: No Transportation Needs   Lack of Transportation (Medical): No   Lack of Transportation (Non-Medical): No  Physical Activity: Inactive   Days of Exercise per Week: 0 days   Minutes of  Exercise per Session: 0 min  Stress: No Stress Concern Present   Feeling of Stress : Not at all  Social Connections: Moderately Integrated   Frequency of Communication with Friends and Family: More than three times a week   Frequency of Social Gatherings with Friends and Family: More than three times a week   Attends Religious Services: More than 4 times per year   Active Member of [employer] or Organizations: Yes   Attends Archivist Meetings: 1 to 4 times per year   Marital Status: Widowed    Clinical Intake:  Pre-visit preparation completed: Yes Activities of Daily Living In your present state of health, do you have any difficulty performing the following activities: 12/05/2020  Hearing? N  Vision? N  Comment Wears contacts  Difficulty concentrating or making decisions? N  Walking or climbing stairs? N  Dressing or bathing? N  Doing errands, shopping? N  Preparing Food and eating ? N  Using the Toilet? N  In the past six months, have you accidently leaked urine? N  Do you have problems with loss of bowel control? N  Managing your Medications? N  Managing your Finances? N  Housekeeping or managing your Housekeeping? N  Some recent data might be hidden    Patient Care Team: Crecencio Mc, MD as PCP - General (Internal Medicine) Jerline Pain, MD as PCP - Cardiology (Cardiology)  Indicate any recent Medical Services you may have received from other than [facility] providers in the past year (date may be approximate).     Assessment:   This is a routine wellness examination for Lacey Brown. Virtual Visit via Telephone Note  I connected with  Dellia Cloud on 12/05/20 at  2:00 PM EDT by telephone and verified that I am speaking with the correct person using two identifiers.  Location: Patient: Home Provider: Office Persons participating in the virtual visit: patient/Nurse Health Advisor   I discussed the limitations, risks, security and privacy concerns of  performing an evaluation and management service by telephone and the availability of in person appointments. The patient expressed understanding and agreed to proceed.  Interactive audio and video telecommunications were attempted between this nurse and patient, however failed, due to patient having technical difficulties OR patient did not have access to video capability.  We continued and completed visit with audio only.  Some vital signs may be absent or patient reported.   Criselda Peaches, LPN  Hearing/Vision screen Hearing Screening - Comments:: No difficulty hearing. Vision Screening - Comments:: Wears contacts Patient has no difficulty hearing.  Dietary issues and exercise activities discussed:  Regular Diet   Goals Addressed             This Visit's Progress    Maintain healthy lifestyle         Depression Screen PHQ 2/9 Scores 12/05/2020 10/31/2020 08/01/2020 12/02/2019 12/01/2018 11/25/2017 09/16/2016  PHQ - 2 Score 0 0 0 0 0 0 0  PHQ- 9 Score - - 0 - - - -    Fall Risk Fall Risk  12/05/2020 10/31/2020 08/01/2020 12/02/2019 12/01/2018  Falls in the past year? 0 0 0 0 0  Comment - - - - -  Number falls in past yr: 0 - - - 0  Injury with Fall? 0 - - - -  Follow up - Falls evaluation completed Falls evaluation completed - -    FALL RISK PREVENTION PERTAINING TO THE HOME:  Any stairs in or around the home? No  If so, are there any without handrails? No  Home free of loose throw rugs in walkways, pet beds, electrical cords, etc? Yes  Adequate lighting in your home to reduce risk of falls? Yes   ASSISTIVE DEVICES UTILIZED TO PREVENT FALLS:  Life alert? No  Use of a cane, walker or w/c? No  Grab bars in the bathroom? Yes  Shower chair or bench in shower? Yes  Elevated toilet seat or a handicapped toilet? Yes   TIMED UP AND GO:  Was the test performed? No . Audio Visit.  Cognitive Function: Patient alert and oriented x3.     Immunizations Immunization  History  Administered Date(s) Administered   Influenza, High Dose Seasonal PF 11/06/2014, 11/06/2017, 10/13/2018, 10/07/2019   Influenza,inj,Quad PF,6+ Mos 11/11/2013   Influenza-Unspecified 10/31/2011, 11/18/2012, 11/11/2013, 11/10/2014, 11/30/2015, 10/06/2020   PFIZER(Purple Top)SARS-COV-2 Vaccination 02/24/2019, 03/22/2019, 11/16/2019   Pneumococcal Conjugate-13 06/08/2014   Pneumococcal Polysaccharide-23 10/12/2008   Zoster Recombinat (Shingrix) 10/28/2018, 12/28/2018   Zoster, Live 11/29/2009    TDAP status: Due, Education has been provided regarding the importance of this vaccine. Advised may receive this vaccine at local pharmacy or Health Dept. Aware to provide a copy of the vaccination record if obtained from local pharmacy or Health Dept. Verbalized acceptance and understanding.  COVID Booster- Patient Deferred.   Screening Tests Health Maintenance  Topic Date Due   COVID-19 Vaccine (4 - Booster for Pfizer series) 12/21/2020 (Originally 01/11/2020)   TETANUS/TDAP  12/05/2021 (Originally 03/23/1959)   Pneumonia Vaccine 38+ Years old  Completed   INFLUENZA VACCINE  Completed   DEXA SCAN  Completed   Zoster Vaccines- Shingrix  Completed   HPV VACCINES  Aged Out    Health Maintenance  There are no preventive care reminders to display for this patient.   Vision Screening: Recommended annual ophthalmology exams for early detection of glaucoma and other disorders of the eye. Is the patient up to date with their annual eye exam?  Yes  Who is the provider or what is the name of the office in which the patient attends annual eye exams? Yes. Followed by Dr Gloriann Loan.  Dental Screening: Recommended annual dental exams for proper oral hygiene  Community Resource Referral / Chronic Care Management: CRR required this visit?  No   CCM required this visit?  No    Plan:     I have personally reviewed and noted the following in the patient's chart:   Medical and social history Use  of alcohol, tobacco or illicit drugs  Current medications and supplements including opioid prescriptions. Currently taking opioids. Followed by PCP. Functional ability  and status Nutritional status Physical activity Advanced directives List of other physicians Hospitalizations, surgeries, and ER visits in previous 12 months Vitals Screenings to include cognitive, depression, and falls Referrals and appointments  In addition, I have reviewed and discussed with patient certain preventive protocols, quality metrics, and best practice recommendations. A written personalized care plan for preventive services as well as general preventive health recommendations were provided to patient.     Criselda Peaches, LPN   [id number]      I have reviewed the above information and agree with above.   Deborra Medina, MD

## 2020-12-05 NOTE — Patient Instructions (Addendum)
Lacey Brown , Thank you for taking time to come for your Medicare Wellness Visit. I appreciate your ongoing commitment to your health goals. Please review the following plan we discussed and let me know if I can assist you in the future.   These are the goals we discussed:  Goals      Maintain healthy lifestyle        This is a list of the screening recommended for you and due dates:  Health Maintenance  Topic Date Due   COVID-19 Vaccine (4 - Booster for Pfizer series) 12/21/2020*   Tetanus Vaccine  12/05/2021*   Pneumonia Vaccine  Completed   Flu Shot  Completed   DEXA scan (bone density measurement)  Completed   Zoster (Shingles) Vaccine  Completed   HPV Vaccine  Aged Out  *Topic was postponed. The date shown is not the original due date.   Advanced directives: No Patient declined  Conditions/risks identified: None  Next appointment: Follow up in one year for your annual wellness visit   Preventive Care 65 Years and Older, Female Preventive care refers to lifestyle choices and visits with your health care provider that can promote health and wellness. What does preventive care include? A yearly physical exam. This is also called an annual well check. Dental exams once or twice a year. Routine eye exams. Ask your health care provider how often you should have your eyes checked. Personal lifestyle choices, including: Daily care of your teeth and gums. Regular physical activity. Eating a healthy diet. Avoiding tobacco and drug use. Limiting alcohol use. Practicing safe sex. Taking low-dose aspirin every day. Taking vitamin and mineral supplements as recommended by your health care provider. What happens during an annual well check? The services and screenings done by your health care provider during your annual well check will depend on your age, overall health, lifestyle risk factors, and family history of disease. Counseling  Your health care provider may ask you  questions about your: Alcohol use. Tobacco use. Drug use. Emotional well-being. Home and relationship well-being. Sexual activity. Eating habits. History of falls. Memory and ability to understand (cognition). Work and work Statistician. Reproductive health. Screening  You may have the following tests or measurements: Height, weight, and BMI. Blood pressure. Lipid and cholesterol levels. These may be checked every 5 years, or more frequently if you are over 90 years old. Skin check. Lung cancer screening. You may have this screening every year starting at age 52 if you have a 30-pack-year history of smoking and currently smoke or have quit within the past 15 years. Fecal occult blood test (FOBT) of the stool. You may have this test every year starting at age 28. Flexible sigmoidoscopy or colonoscopy. You may have a sigmoidoscopy every 5 years or a colonoscopy every 10 years starting at age 49. Hepatitis C blood test. Hepatitis B blood test. Sexually transmitted disease (STD) testing. Diabetes screening. This is done by checking your blood sugar (glucose) after you have not eaten for a while (fasting). You may have this done every 1-3 years. Bone density scan. This is done to screen for osteoporosis. You may have this done starting at age 30. Mammogram. This may be done every 1-2 years. Talk to your health care provider about how often you should have regular mammograms. Talk with your health care provider about your test results, treatment options, and if necessary, the need for more tests. Vaccines  Your health care provider may recommend certain vaccines, such as: Influenza  vaccine. This is recommended every year. Tetanus, diphtheria, and acellular pertussis (Tdap, Td) vaccine. You may need a Td booster every 10 years. Zoster vaccine. You may need this after age 42. Pneumococcal 13-valent conjugate (PCV13) vaccine. One dose is recommended after age 77. Pneumococcal polysaccharide  (PPSV23) vaccine. One dose is recommended after age 13. Talk to your health care provider about which screenings and vaccines you need and how often you need them. This information is not intended to replace advice given to you by your health care provider. Make sure you discuss any questions you have with your health care provider. Document Released: 02/17/2015 Document Revised: 10/11/2015 Document Reviewed: 11/22/2014 Elsevier Interactive Patient Education  2017 [place] Prevention in the Home Falls can cause injuries. They can happen to people of all ages. There are many things you can do to make your home safe and to help prevent falls. What can I do on the outside of my home? Regularly fix the edges of walkways and driveways and fix any cracks. Remove anything that might make you trip as you walk through a door, such as a raised step or threshold. Trim any bushes or trees on the path to your home. Use bright outdoor lighting. Clear any walking paths of anything that might make someone trip, such as rocks or tools. Regularly check to see if handrails are loose or broken. Make sure that both sides of any steps have handrails. Any raised decks and porches should have guardrails on the edges. Have any leaves, snow, or ice cleared regularly. Use sand or salt on walking paths during winter. Clean up any spills in your garage right away. This includes oil or grease spills. What can I do in the bathroom? Use night lights. Install grab bars by the toilet and in the tub and shower. Do not use towel bars as grab bars. Use non-skid mats or decals in the tub or shower. If you need to sit down in the shower, use a plastic, non-slip stool. Keep the floor dry. Clean up any water that spills on the floor as soon as it happens. Remove soap buildup in the tub or shower regularly. Attach bath mats securely with double-sided non-slip rug tape. Do not have throw rugs and other things on the  floor that can make you trip. What can I do in the bedroom? Use night lights. Make sure that you have a light by your bed that is easy to reach. Do not use any sheets or blankets that are too big for your bed. They should not hang down onto the floor. Have a firm chair that has side arms. You can use this for support while you get dressed. Do not have throw rugs and other things on the floor that can make you trip. What can I do in the kitchen? Clean up any spills right away. Avoid walking on wet floors. Keep items that you use a lot in easy-to-reach places. If you need to reach something above you, use a strong step stool that has a grab bar. Keep electrical cords out of the way. Do not use floor polish or wax that makes floors slippery. If you must use wax, use non-skid floor wax. Do not have throw rugs and other things on the floor that can make you trip. What can I do with my stairs? Do not leave any items on the stairs. Make sure that there are handrails on both sides of the stairs and use them. Fix  handrails that are broken or loose. Make sure that handrails are as long as the stairways. Check any carpeting to make sure that it is firmly attached to the stairs. Fix any carpet that is loose or worn. Avoid having throw rugs at the top or bottom of the stairs. If you do have throw rugs, attach them to the floor with carpet tape. Make sure that you have a light switch at the top of the stairs and the bottom of the stairs. If you do not have them, ask someone to add them for you. What else can I do to help prevent falls? Wear shoes that: Do not have high heels. Have rubber bottoms. Are comfortable and fit you well. Are closed at the toe. Do not wear sandals. If you use a stepladder: Make sure that it is fully opened. Do not climb a closed stepladder. Make sure that both sides of the stepladder are locked into place. Ask someone to hold it for you, if possible. Clearly mark and make  sure that you can see: Any grab bars or handrails. First and last steps. Where the edge of each step is. Use tools that help you move around (mobility aids) if they are needed. These include: Canes. Walkers. Scooters. Crutches. Turn on the lights when you go into a dark area. Replace any light bulbs as soon as they burn out. Set up your furniture so you have a clear path. Avoid moving your furniture around. If any of your floors are uneven, fix them. If there are any pets around you, be aware of where they are. Review your medicines with your doctor. Some medicines can make you feel dizzy. This can increase your chance of falling. Ask your doctor what other things that you can do to help prevent falls. This information is not intended to replace advice given to you by your health care provider. Make sure you discuss any questions you have with your health care provider. Document Released: 11/17/2008 Document Revised: 06/29/2015 Document Reviewed: 02/25/2014 Elsevier Interactive Patient Education  2017 [place].  Opioid Pain Medicine Management Opioids are powerful medicines that are used to treat moderate to severe pain. When used for short periods of time, they can help you to: Sleep better. Do better in physical or occupational therapy. Feel better in the first few days after an injury. Recover from surgery. Opioids should be taken with the supervision of a trained health care provider. They should be taken for the shortest period of time possible. This is because opioids can be addictive, and the longer you take opioids, the greater your risk of addiction. This addiction can also be called opioid use disorder. What are the risks? Using opioid pain medicines for longer than 3 days increases your risk of side effects. Side effects include: Constipation. Nausea and vomiting. Breathing difficulties (respiratory depression). Drowsiness. Confusion. Opioid use  disorder. Itching. Taking opioid pain medicine for a long period of time can affect your ability to do daily tasks. It also puts you at risk for: Motor vehicle crashes. Depression. Suicide. Heart attack. Overdose, which can be life-threatening. What is a pain treatment plan? A pain treatment plan is an agreement between you and your health care provider. Pain is unique to each person, and treatments vary depending on your condition. To manage your pain, you and your health care provider need to work together. To help you do this: Discuss the goals of your treatment, including how much pain you might expect to have and  how you will manage the pain. Review the risks and benefits of taking opioid medicines. Remember that a good treatment plan uses more than one approach and minimizes the chance of side effects. Be honest about the amount of medicines you take and about any drug or alcohol use. Get pain medicine prescriptions from only one health care provider. Pain can be managed with many types of alternative treatments. Ask your health care provider to refer you to one or more specialists who can help you manage pain through: Physical or occupational therapy. Counseling (cognitive behavioral therapy). Good nutrition. Biofeedback. Massage. Meditation. Non-opioid medicine. Following a gentle exercise program. How to use opioid pain medicine Taking medicine Take your pain medicine exactly as told by your health care provider. Take it only when you need it. If your pain gets less severe, you may take less than your prescribed dose if your health care provider approves. If you are not having pain, do nottake pain medicine unless your health care provider tells you to take it. If your pain is severe, do nottry to treat it yourself by taking more pills than instructed on your prescription. Contact your health care provider for help. Write down the times when you take your pain medicine. It is  easy to become confused while on pain medicine. Writing the time can help you avoid overdose. Take other over-the-counter or prescription medicines only as told by your health care provider. Keeping yourself and others safe  While you are taking opioid pain medicine: Do not drive, use machinery, or power tools. Do not sign legal documents. Do not drink alcohol. Do not take sleeping pills. Do not supervise children by yourself. Do not do activities that require climbing or being in high places. Do not go to a lake, river, ocean, spa, or swimming pool. Do not share your pain medicine with anyone. Keep pain medicine in a locked cabinet or in a secure area where pets and children cannot reach it. Stopping your use of opioids If you have been taking opioid medicine for more than a few weeks, you may need to slowly decrease (taper) how much you take until you stop completely. Tapering your use of opioids can decrease your risk of symptoms of withdrawal, such as: Pain and cramping in the abdomen. Nausea. Sweating. Sleepiness. Restlessness. Uncontrollable shaking (tremors). Cravings for the medicine. Do not attempt to taper your use of opioids on your own. Talk with your health care provider about how to do this. Your health care provider may prescribe a step-down schedule based on how much medicine you are taking and how long you have been taking it. Getting rid of leftover pills Do not save any leftover pills. Get rid of leftover pills safely by: Taking the medicine to a prescription take-back program. This is usually offered by the county or law enforcement. Bringing them to a pharmacy that has a drug disposal container. Flushing them down the toilet. Check the label or package insert of your medicine to see whether this is safe to do. Throwing them out in the trash. Check the label or package insert of your medicine to see whether this is safe to do. If it is safe to throw it out, remove  the medicine from the original container, put it into a sealable bag or container, and mix it with used coffee grounds, food scraps, dirt, or cat litter before putting it in the trash. Follow these instructions at home: Activity Do exercises as told by your health care  provider. Avoid activities that make your pain worse. Return to your normal activities as told by your health care provider. Ask your health care provider what activities are safe for you. General instructions You may need to take these actions to prevent or treat constipation: Drink enough fluid to keep your urine pale yellow. Take over-the-counter or prescription medicines. Eat foods that are high in fiber, such as beans, whole grains, and fresh fruits and vegetables. Limit foods that are high in fat and processed sugars, such as fried or sweet foods. Keep all follow-up visits. This is important. Where to find support If you have been taking opioids for a long time, you may benefit from receiving support for quitting from a local support group or counselor. Ask your health care provider for a referral to these resources in your area. Where to find more information Centers for Disease Control and Prevention (CDC): http://www.wolf.info/ [place] Food and Drug Administration (FDA): [place] Get help right away if: You may have taken too much of an opioid (overdosed). Common symptoms of an overdose: Your breathing is slower or more shallow than normal. You have a very slow heartbeat (pulse). You have slurred speech. You have nausea and vomiting. Your pupils become very small. You have other potential symptoms: You are very confused. You faint or feel like you will faint. You have cold, clammy skin. You have blue lips or fingernails. You have thoughts of harming yourself or harming others. These symptoms may represent a serious problem that is an emergency. Do not wait to see if the symptoms will go away. Get medical help right away.  Call your local emergency services (911 in the [place] Do not drive yourself to the hospital.  If you ever feel like you may hurt yourself or others, or have thoughts about taking your own life, get help right away. Go to your nearest emergency department or: Call your local emergency services (911 in the [place] Call the [facility] 731 811 3291 in the [place] Call a suicide crisis helpline, such as the [place] at 614-025-3005. This is open 24 hours a day in the [place] Text the Crisis Text Line at (517)852-5038 (in the [place].). Summary Opioid medicines can help you manage moderate to severe pain for a short period of time. A pain treatment plan is an agreement between you and your health care provider. Discuss the goals of your treatment, including how much pain you might expect to have and how you will manage the pain. If you think that you or someone else may have taken too much of an opioid, get medical help right away. This information is not intended to replace advice given to you by your health care provider. Make sure you discuss any questions you have with your health care provider. Document Revised: 05/03/2020 Document Reviewed: 05/03/2020 Elsevier Patient Education  [place].

## 2020-12-08 ENCOUNTER — Telehealth: Payer: Self-pay | Admitting: Orthopaedic Surgery

## 2020-12-08 NOTE — Telephone Encounter (Signed)
Told patient to give it another week because she says she is getting some relief If she calls back we can set her up with Dr. Marlou Sa for a second opinion

## 2020-12-08 NOTE — Telephone Encounter (Signed)
Patient called. Says she is still in a lot of pain. Would like to know what she can do. Her call back number is [phone]

## 2020-12-11 ENCOUNTER — Telehealth: Payer: Self-pay | Admitting: Orthopaedic Surgery

## 2020-12-11 ENCOUNTER — Other Ambulatory Visit: Payer: Self-pay | Admitting: Orthopaedic Surgery

## 2020-12-11 MED ORDER — MELOXICAM 15 MG PO TABS: 15.0000 mg | ORAL_TABLET | Freq: Every day | ORAL | 3 refills | Status: DC | PRN

## 2020-12-11 NOTE — Telephone Encounter (Signed)
Pt called stating she ran out of her meloxicam rx and she isn't sure if DR.Ninfa Linden wanted her to continue it. Pt would like a CB to be updated if he doesn't want her to keep taking it if he does she would like a CB when's sent it in.   [phone]

## 2020-12-11 NOTE — Telephone Encounter (Signed)
Patient aware of the below message  

## 2020-12-15 ENCOUNTER — Telehealth: Payer: Self-pay | Admitting: Orthopaedic Surgery

## 2020-12-15 NOTE — Telephone Encounter (Signed)
Pt called requesting a call. Pt has some medical questions. Please call pt at [phone].

## 2020-12-15 NOTE — Telephone Encounter (Signed)
I called and talked to the pt. She wants advise on what to do from here. Pt states she is still having pain in her leg and knee. She wants to know if Dr. Ninfa Linden thinks it is coming from her back or if she is having a reaction to the kenolog that another dr. Used on her knee. She also wants to know what she can do from here for her pain. She stated the tylenol and meloxicam are not helping. Please advise

## 2020-12-18 NOTE — Telephone Encounter (Signed)
I called and talked to the pt. She stated she is scheduled to see Lurena Joiner on Friday but would rather see Dr.Dean since it is a 2nd opinion. Can Dr. Marlou Sa work her in?   She also stated she will call dr. Windy Carina office and schedule an appt with them for her LBP

## 2020-12-19 DIAGNOSIS — H18413 Arcus senilis, bilateral: Secondary | ICD-10-CM | POA: Diagnosis not present

## 2020-12-19 DIAGNOSIS — H25043 Posterior subcapsular polar age-related cataract, bilateral: Secondary | ICD-10-CM | POA: Diagnosis not present

## 2020-12-19 DIAGNOSIS — H25013 Cortical age-related cataract, bilateral: Secondary | ICD-10-CM | POA: Diagnosis not present

## 2020-12-19 DIAGNOSIS — H2513 Age-related nuclear cataract, bilateral: Secondary | ICD-10-CM | POA: Diagnosis not present

## 2020-12-19 DIAGNOSIS — H35371 Puckering of macula, right eye: Secondary | ICD-10-CM | POA: Diagnosis not present

## 2020-12-19 DIAGNOSIS — H2511 Age-related nuclear cataract, right eye: Secondary | ICD-10-CM | POA: Diagnosis not present

## 2020-12-19 NOTE — Telephone Encounter (Signed)
Scheduled

## 2020-12-20 ENCOUNTER — Telehealth: Payer: Self-pay

## 2020-12-20 ENCOUNTER — Other Ambulatory Visit: Payer: Self-pay

## 2020-12-20 ENCOUNTER — Ambulatory Visit (INDEPENDENT_AMBULATORY_CARE_PROVIDER_SITE_OTHER): Payer: Medicare HMO | Admitting: Orthopedic Surgery

## 2020-12-20 ENCOUNTER — Encounter: Payer: Self-pay | Admitting: Orthopedic Surgery

## 2020-12-20 DIAGNOSIS — M25562 Pain in left knee: Secondary | ICD-10-CM | POA: Diagnosis not present

## 2020-12-20 NOTE — Telephone Encounter (Signed)
Noted  

## 2020-12-20 NOTE — Telephone Encounter (Signed)
Can we get auth for left knee gel injection?

## 2020-12-21 DIAGNOSIS — M5416 Radiculopathy, lumbar region: Secondary | ICD-10-CM | POA: Diagnosis not present

## 2020-12-22 ENCOUNTER — Ambulatory Visit: Payer: Medicare HMO | Admitting: Surgical

## 2020-12-22 ENCOUNTER — Encounter: Payer: Self-pay | Admitting: Orthopedic Surgery

## 2020-12-22 NOTE — Progress Notes (Signed)
Office Visit Note   Patient: Lacey Brown           Date of Birth: [birth year]           MRN: [id number] Visit Date: 12/20/2020 Requested by: Crecencio Mc, MD [address],  [place] [id number] PCP: Crecencio Mc, MD  Subjective: Chief Complaint  Patient presents with   Left Knee - Follow-up    HPI: Lacey Brown is an 80 year old female with left knee pain.  She had a fall over a month ago and had significant pain at that time.  Radiographs of the tib-fib region and knee on the left in early October were negative for fracture.  MRI scan of the left knee is reviewed from 11/22/2020 and it shows no stress reaction stress fracture or acute fracture.  There is a cystic structure in the posterior aspect of the knee which is relatively small.  Patient also has severe end-stage patellofemoral arthritis which was not mentioned in the report reading.  Currently the patient has minimal to no pain.  She did have a cortisone shot with Dr. Ninfa Linden 3 weeks ago which has helped significantly.  Prior to that she had pain which kept her from walking.  The pain radiated down from the knee into the foot.  Most of her pain is in the anterolateral region.  Tylenol has been helpful.              ROS: All systems reviewed are negative as they relate to the chief complaint within the history of present illness.  Patient denies  fevers or chills.   Assessment & Plan: Visit Diagnoses:  1. Acute pain of left knee     Plan: Impression is left knee pain with significant patellofemoral arthritis and no definite findings to explain the difficulty she had with ambulation over a month ago.  Currently she has improved with a cortisone injection about 3 weeks ago.  No exam evidence or historical evidence that this is referred pain from the hip or back.  I recommend gel shot injection in the left knee in about 3 to 4 weeks to see effect can get her over the hump of her pain.  We will get that  arranged and have her follow-up at that time.  Follow-Up Instructions: Return in about 3 weeks (around 01/10/2021).   Orders:  No orders of the defined types were placed in this encounter.  No orders of the defined types were placed in this encounter.     Procedures: No procedures performed   Clinical Data: No additional findings.  Objective: Vital Signs: There were no vitals taken for this visit.  Physical Exam:   Constitutional: Patient appears well-developed HEENT:  Head: Normocephalic Eyes:EOM are normal Neck: Normal range of motion Cardiovascular: Normal rate Pulmonary/chest: Effort normal Neurologic: Patient is alert Skin: Skin is warm Psychiatric: Patient has normal mood and affect   Ortho Exam: Ortho exam demonstrates full active and passive range of motion of the left knee.  No effusion.  Patient has mild anterolateral joint line tenderness.  Collateral and cruciate ligaments are stable.  No groin pain on the left with internal ex rotation of the leg.  Pedal pulses palpable.  Extensor mechanism intact.  Tibia femur patella nontender  Specialty Comments:  No specialty comments available.  Imaging: No results found.   PMFS History: Patient Active Problem List   Diagnosis Date Noted   Aortic atherosclerosis ([place]) 08/02/2020   Osteoarthritis of knees,  bilateral 08/02/2020   Osteoporosis 08/02/2020   Arthritis 10/24/2014   HTN (hypertension) 10/24/2014   Asthma 10/24/2014   GERD (gastroesophageal reflux disease) 10/24/2014   Past Medical History:  Diagnosis Date   Arthritis    back, neck; right shoulder;    Asthma    using advair prn   Chicken pox    Hyperlipidemia    Hypertension    controlled with medication;     Family History  Problem Relation Age of Onset   Heart disease Mother    Heart disease Father    Arthritis Maternal Grandmother    Diabetes Neg Hx    Cancer Neg Hx    Stroke Neg Hx     Past Surgical History:  Procedure  Laterality Date   BREAST EXCISIONAL BIOPSY Left    TONSILLECTOMY AND ADENOIDECTOMY     Social History   Occupational History   Not on file  Tobacco Use   Smoking status: Former    Types: Cigarettes    Quit date: 04/17/2009    Years since quitting: 11.6   Smokeless tobacco: Never  Vaping Use   Vaping Use: Never used  Substance and Sexual Activity   Alcohol use: Yes    Alcohol/week: 0.0 standard drinks    Comment: occasional   Drug use: No   Sexual activity: Never

## 2020-12-25 ENCOUNTER — Telehealth: Payer: Self-pay | Admitting: Internal Medicine

## 2020-12-25 NOTE — Telephone Encounter (Signed)
Pt called in regards to prolia inj. Pt was previously being seen at [facility] and has since established with Dr. Derrel Nip. Pls call pt and let her know the status and when she can come in.

## 2020-12-27 NOTE — Telephone Encounter (Signed)
Would PCP like patient to continue Prolia? Due 02/01/21. Last Calcium 8.4  since she in new patient need PCP approval.

## 2021-01-01 NOTE — Telephone Encounter (Signed)
I spoke to the patient to inform her that we will be back in touch with her after going through the prior approval process for he prolia.

## 2021-01-01 NOTE — Telephone Encounter (Signed)
Approval ha sbeen processed awaiting on [employer] verification for approval.

## 2021-01-03 ENCOUNTER — Ambulatory Visit: Payer: Medicare HMO | Admitting: Orthopaedic Surgery

## 2021-01-08 ENCOUNTER — Ambulatory Visit: Payer: Medicare HMO | Attending: Student | Admitting: Physical Therapy

## 2021-01-08 VITALS — BP 186/72 | HR 76

## 2021-01-08 DIAGNOSIS — R262 Difficulty in walking, not elsewhere classified: Secondary | ICD-10-CM | POA: Diagnosis not present

## 2021-01-08 DIAGNOSIS — M545 Low back pain, unspecified: Secondary | ICD-10-CM | POA: Insufficient documentation

## 2021-01-08 NOTE — Therapy (Addendum)
[facility] [address], [place], [address] Phone: [phone]   Fax:  [phone]  Physical Therapy Evaluation  Patient Details  Name: Lacey Brown MRN: [id number] Date of Birth: 1940/06/24 Referring Provider (PT): Dr. Saintclair Halsted   Encounter Date: 01/08/2021  PT End of Session  Row Name 01/09/21 1614      PT Visits / Re-Eval  Visit Number 1      Number of Visits 16      Date for PT Re-Evaluation 03/05/21      PT Time Calculation  PT Start Time 1415      PT Stop Time 1500      PT Time Calculation (min) 45 min      PT - End of Session  Equipment Utilized During Treatment Gait belt      Activity Tolerance Patient tolerated treatment well      Behavior During Therapy WFL for tasks assessed/performed        Past Medical History:  Diagnosis Date   Arthritis    back, neck; right shoulder;    Asthma    using advair prn   Chicken pox    Hyperlipidemia    Hypertension    controlled with medication;     Past Surgical History:  Procedure Laterality Date   BREAST EXCISIONAL BIOPSY Left    TONSILLECTOMY AND ADENOIDECTOMY      Vitals:   01/08/21 1420  BP: (!) 186/72  Pulse: 76      Subjective Assessment - 01/08/21 1420     Subjective Pt had a cortisone shot at the end of September and she was not able to walk shortly after. After doing imaging of knee, she was only found to have ganglion cyst. Because of her knee pain, she now has back problems from having to limp.    Pertinent History Dr. Candelaria Stagers 10/18/20:  The patient has intermittent buttock pain unknown etiology. She is less symptomatic today so it is difficult to narrow down the exact cause. I explained that she could have symptoms from radicular pain versus facet mediated pain versus myofascial pain versus SI joint mediated pain versus hip joint mediated pain versus myofascial pain specifically from the piriformis.  - She has no abnormalities  to neurologic exam. She does have some decreased hip range of motion but this does not cause pain today. She has no tenderness to palpation today. Previous x-rays of her lumbar spine show some upper lumbar compression deformities as well as multilevel degenerative disc and facet change. X-rays of her hips show-moderate right and mild left degenerative joint change. I discussed the possible causes of her symptoms and treatment options moving forward with activity modification, home exercise, physical therapy, medication steroid injections depending on the exact pain generator. Based on the intermittent nature of her symptoms and no major abnormality to physical exam aside for some decreased right hip motion, I recommended continuing with over-the-counter medication, massage,   9/14    Limitations Walking;Standing;House hold activities    How long can you sit comfortably? N/a    How long can you stand comfortably? Not a problem    How long can you walk comfortably? Difficulty with walking    Diagnostic tests CLINICAL DATA:  Low back pain.  History of arthritis.     EXAM:  LUMBAR SPINE - COMPLETE 4+ VIEW     COMPARISON:  No prior.     FINDINGS:  Lumbar spine numbered with the lowest  segmented appearing lumbar  shaped vertebrae on lateral view as L5. Diffuse multilevel severe  degenerative change. Vacuum disc phenomena noted at L4-L5 and L5-S1.  Multiple mild thoracic compression fractures are noted. Prominent L1  compression fracture noted. Moderate L2 compression fracture noted.  Age of these compression fractures is undetermined. Aortoiliac  atherosclerotic vascular calcification. Pelvic calcifications  consistent phleboliths. Splenic calcifications, most likely  granulomas noted. Questionable faint calcifications in the right  upper quadrant. These could be within the liver, gallbladder, or  right kidney.     IMPRESSION:  1. Multilevel severe degenerative change lumbar spine. Multiple  thoracic spine mild  compression fractures are noted. Prominent L1  compression fracture. Moderate L2 compression fracture. Age of these  compression fractures undetermined.     2.  Aortoiliac atherosclerotic vascular disease.        Electronically Signed    By: Marcello Moores  Register    On: 11/25/2017 17:36    Patient Stated Goals Wants to return to her normal walking    Currently in Pain? Yes    Pain Score 4     Pain Location Back    Pain Orientation Left;Right    Pain Type Chronic pain    Pain Onset More than a month ago    Pain Frequency Intermittent    Aggravating Factors  Walking, Standing for a long time like cooking for a long time    Pain Relieving Factors Ice pack    Effect of Pain on Daily Activities Difficulty with cooking and cleaning            [facility] PT Assessment  Row Name 01/08/21 0001      Assessment  Medical Diagnosis lumbar stenosis      Referring Provider (PT) Dr. Saintclair Halsted      Onset Date/Surgical Date 11/25/20      Precautions  Precautions None      Restrictions  Weight Bearing Restrictions No      Balance Screen  Has the patient fallen in the past 6 months No      Has the patient had a decrease in activity level because of a fear of falling?  No      Is the patient reluctant to leave their home because of a fear of falling?  No      Home Environment  Living Environment Private residence      [place] Access Level entry      [place] One level      Prior Function  Level of [place] Retired      Associate Professor  Overall Cognitive Status Within Functional Limits for tasks assessed      Attention Focused      Focused Attention Appears intact      Memory Appears intact      Awareness Appears intact         LOW BACK EVALUATION   Gait Pattern: Antalgic gait on LLE. Decreased stand time on LLE and decrease swing time on RLE.   - Cauda Equina Syndrome: Negative to all symptoms  Bladder/bowel dysfunction Saddle anesthesia   Sexual dysfunction Possible neurological deficits in the lower limb (motor or sensory loss, reflex change)   AROM (in standing):         Lumbar flexion: 100%                ext: 100%  SB R/L: 75%%                Rot R/L: 75%   AROM:                   R      L          [place]           Hip flexion:  120    120         120             ER:             45    45              45              IR:              20     20             45  AROM:                   R      L          [place]orms           Hip flexion:  120    120         120             ER:             45    45              45              IR:              20     20             45   Strength:                R     L                     Hip flex:      5/5     5/5               abd:        3+/5   3+/5               add:        3/5     3/5                ext:         3/5     3/5         Knee flex:    4/5    4/5               ext:        3+/5    3+/5          Ankle DF:   5/5     5/5     Great toe ext:    5/5     5/5   Symptom response to: No directional preference    Special Tests:          SLR: Negative to neurological symptoms          FABERS: - Bilateral          FADDIR: +  R, - L          Thigh thrust: - Bilateral          Thomas Test: + Bilateral          Ely's Test: + Bilateral   Palpation: Right glute, painful to palpate  *= Painful    THEREX:  Prone Quad Stretch 2 x 30 sec  Lower Trunk Rotation 2 x 10          PT Short Term Goals - 01/09/21 1632       PT SHORT TERM GOAL #1   Title Patient will demonstrate understanding of home exercise plan.    Baseline 01/08/21: NT    Time 2    Period Weeks    Status New    Target Date 01/22/21               PT Long Term Goals - 01/09/21 1640       PT LONG TERM GOAL #1   Title Patient will have improved function and activity level as evidenced by an increase in FOTO score by 10 points or more.    Baseline 01/08/21: 57/67    Time 8    Period  Weeks    Status New    Target Date 03/05/21      PT LONG TERM GOAL #2   Title Patient will improve hip and knee strength by 1 MMT to improve functional strength for increase stability while walking and to offload forces on low back.    Baseline 01/08/21: Hip Ext R/L 3/3 Hip Abd R/L 3+/3+ Hip Add R/L 3/3 Knee Ext R/L 3+/3+ Knee Flex R/L 4/4    Time 8    Period Weeks    Status New    Target Date 03/05/21      PT LONG TERM GOAL #3   Title Patient will demonstrate symmetrical swing and stance time for improved loading of hip and low back and increased stability while walking.    Baseline 01/08/21: Antalgic gait on LLE with decrease step length on RLE and decreased stance time on RLE    Time 8    Period Weeks    Status New    Target Date 03/06/21                    Plan - 01/09/21 1619     Clinical Impression Statement Pt is a 80 yo white female that presents for initial eval for right sided low back pain that occured after abnormal walking pattern from prior knee injury. Pt demonstrates signs and symptoms that place her in the movement control group with moderate to low pain and stable symptom status. Her deficits include decreased hip strength and flexibility and increased low back. Despite prior h/o of sciatica, lumbar radiculopathy ruled out during visit. She will benefit form skilled PT to increase her hip strength and flexibility and to decrease low back pain    Stability/Clinical Decision Making Stable/Uncomplicated    Clinical Decision Making Low    Rehab Potential Good    PT Frequency 2x / week    PT Duration 8 weeks    PT Treatment/Interventions Aquatic Therapy;ADLs/Self Care Home Management;Moist Heat;Cryotherapy;Therapeutic activities;Therapeutic exercise;Balance training;Neuromuscular re-education;Passive range of motion;Dry needling;Manual techniques;Patient/family education;Stair training;Gait training;Joint Manipulations;Spinal Manipulations;Electrical Stimulation     PT Next Visit Plan Finish eval with functional assessments . Progress hip strengthening and begin balance exercises.    Consulted and Agree with Plan of Care Patient  HEP includes the following:   Access Code: [email or web address] URL: [email or web address] Date: 01/08/2021 Prepared by: Bradly Chris  Exercises Supine Lower Trunk Rotation - 1 x daily - 3 x weekly - 3 sets - 10 reps Seated Hip External Rotation Stretch - 1 x daily - 7 x weekly - 1 sets - 3 reps - 60 hold Prone Quadriceps Stretch with Strap - 1 x daily - 7 x weekly - 1 sets - 3 reps - 60 hold   Patient will benefit from skilled therapeutic intervention in order to improve the following deficits and impairments:  Abnormal gait, Impaired flexibility, Pain, Decreased strength, Decreased balance  Visit Diagnosis: Right-sided low back pain without sciatica, unspecified chronicity  Difficulty in walking, not elsewhere classified     Problem List Patient Active Problem List   Diagnosis Date Noted   Aortic atherosclerosis ([place]) 08/02/2020   Osteoarthritis of knees, bilateral 08/02/2020   Osteoporosis 08/02/2020   Arthritis 10/24/2014   HTN (hypertension) 10/24/2014   Asthma 10/24/2014   GERD (gastroesophageal reflux disease) 10/24/2014   Bradly Chris PT, DPT  01/09/2021, 4:50 PM  [facility] [address], [place], [address] Phone: [phone]   Fax:  [phone]  Name: Myrna Vonseggern MRN: [id number] Date of Birth: [birth year]

## 2021-01-10 ENCOUNTER — Ambulatory Visit: Payer: Medicare HMO | Admitting: Physical Therapy

## 2021-01-11 ENCOUNTER — Ambulatory Visit: Payer: Medicare HMO | Admitting: Physical Therapy

## 2021-01-11 DIAGNOSIS — M545 Low back pain, unspecified: Secondary | ICD-10-CM

## 2021-01-11 DIAGNOSIS — R262 Difficulty in walking, not elsewhere classified: Secondary | ICD-10-CM | POA: Diagnosis not present

## 2021-01-11 NOTE — Therapy (Signed)
[facility] [address], [place], [address] Phone: [phone]   Fax:  [phone]  Physical Therapy Treatment  Patient Details  Name: Lacey Brown MRN: [id number] Date of Birth: Mar 30, 1940 Referring Provider (PT): Dr. Saintclair Halsted   Encounter Date: 01/11/2021   PT End of Session - 01/11/21 1346     Visit Number 2    Number of Visits 16    Date for PT Re-Evaluation 01/08/21    PT Start Time 1330    PT Stop Time 1415    PT Time Calculation (min) 45 min    Equipment Utilized During Treatment Gait belt    Activity Tolerance Patient tolerated treatment well    Behavior During Therapy WFL for tasks assessed/performed             Past Medical History:  Diagnosis Date   Arthritis    back, neck; right shoulder;    Asthma    using advair prn   Chicken pox    Hyperlipidemia    Hypertension    controlled with medication;     Past Surgical History:  Procedure Laterality Date   BREAST EXCISIONAL BIOPSY Left    TONSILLECTOMY AND ADENOIDECTOMY      There were no vitals filed for this visit.   Subjective Assessment - 01/11/21 1338     Subjective Pt reports increased low back pain during today's session. She had several questions about the exercises which she is confused about.    Pertinent History Dr. Candelaria Stagers 10/18/20:  The patient has intermittent buttock pain unknown etiology. She is less symptomatic today so it is difficult to narrow down the exact cause. I explained that she could have symptoms from radicular pain versus facet mediated pain versus myofascial pain versus SI joint mediated pain versus hip joint mediated pain versus myofascial pain specifically from the piriformis.  - She has no abnormalities to neurologic exam. She does have some decreased hip range of motion but this does not cause pain today. She has no tenderness to palpation today. Previous x-rays of her lumbar spine show some upper  lumbar compression deformities as well as multilevel degenerative disc and facet change. X-rays of her hips show-moderate right and mild left degenerative joint change. I discussed the possible causes of her symptoms and treatment options moving forward with activity modification, home exercise, physical therapy, medication steroid injections depending on the exact pain generator. Based on the intermittent nature of her symptoms and no major abnormality to physical exam aside for some decreased right hip motion, I recommended continuing with over-the-counter medication, massage,   9/14    Limitations Walking;Standing;House hold activities    How long can you sit comfortably? N/a    How long can you stand comfortably? Not a problem    How long can you walk comfortably? Difficulty with walking    Diagnostic tests CLINICAL DATA:  Low back pain.  History of arthritis.     EXAM:  LUMBAR SPINE - COMPLETE 4+ VIEW     COMPARISON:  No prior.     FINDINGS:  Lumbar spine numbered with the lowest segmented appearing lumbar  shaped vertebrae on lateral view as L5. Diffuse multilevel severe  degenerative change. Vacuum disc phenomena noted at L4-L5 and L5-S1.  Multiple mild thoracic compression fractures are noted. Prominent L1  compression fracture noted. Moderate L2 compression fracture noted.  Age of these compression fractures is undetermined. Aortoiliac  atherosclerotic vascular calcification. Pelvic calcifications  consistent phleboliths. Splenic calcifications, most likely  granulomas noted. Questionable faint calcifications in the right  upper quadrant. These could be within the liver, gallbladder, or  right kidney.     IMPRESSION:  1. Multilevel severe degenerative change lumbar spine. Multiple  thoracic spine mild compression fractures are noted. Prominent L1  compression fracture. Moderate L2 compression fracture. Age of these  compression fractures undetermined.     2.  Aortoiliac atherosclerotic vascular  disease.        Electronically Signed    By: Marcello Moores  Register    On: 11/25/2017 17:36    Patient Stated Goals Wants to return to her normal walking    Currently in Pain? Yes    Pain Score 6     Pain Location Back    Pain Orientation Right;Left    Pain Descriptors / Indicators Aching    Pain Type Chronic pain    Pain Onset More than a month ago             THEREX:   Lower Trunk Rotations 3 x 10  -min VC to explain holds  Supine Bridge 3 x 10  SLR 3 x 10  -min VC to maintain knee extension  Quad Stretch in Chair  -min VC and TC to plantarflex foot to increase stretch   Gait Analysis:  Increased forward flexion with decreased stance time on LLE and decrease step length on RLE.   10 Meter Walk Test   Gait Speed:    1st trial 14 sec , 2nd trial 14 sec , Avg= 14  sec - Normal Gait speed Avg=  0.71 m/sec     - 0.4 - 0.79 m/sec - limited community ambulator - associated with need for intervention to reduce falls; increased risk for LE limitation and death and hospitalization in 1 year; increased dependence for personal care; 2-year mortality and morbidity; increased functional impairments, severe walking disability    Educated pt on using rollator for community ambulation and inside house to avoid falls with unsteady gait. Pt currently has a Social worker that she can use.     Updated HEP and educated patient on changes to exercises and addition of new exercises supine bridges and SLR.         PT Education - 01/11/21 1345     Education Details form and technique for appropriate exercise    Person(s) Educated Patient    Methods Explanation    Comprehension Verbalized understanding;Returned demonstration;Verbal cues required              PT Short Term Goals - 01/11/21 1413       PT SHORT TERM GOAL #1   Title Patient will demonstrate understanding of home exercise plan.    Baseline 01/08/21: NT    Time 2    Period Weeks    Status On-going    Target Date  01/22/21               PT Long Term Goals - 01/11/21 1413       PT LONG TERM GOAL #1   Title Patient will have improved function and activity level as evidenced by an increase in FOTO score by 10 points or more.    Baseline 01/08/21: 57/67    Time 8    Period Weeks    Status On-going    Target Date 03/05/21      PT LONG TERM GOAL #2   Title Patient will improve hip and knee strength by  1 MMT to improve functional strength for increase stability while walking and to offload forces on low back.    Baseline 01/08/21: Hip Ext R/L 3/3 Hip Abd R/L 3+/3+ Hip Add R/L 3/3 Knee Ext R/L 3+/3+ Knee Flex R/L 4/4    Time 8    Period Weeks    Status On-going    Target Date 03/05/21      PT LONG TERM GOAL #3   Title Patient will demonstrate symmetrical swing and stance time for improved loading of hip and low back and increased stability while walking.    Baseline 01/08/21: Antalgic gait on LLE with decrease step length on RLE and decreased stance time on RLE    Time 8    Period Weeks    Status On-going    Target Date 03/06/21                   Plan - 01/11/21 1348     Personal Factors and Comorbidities Age;Comorbidity 3+    Comorbidities Knee OA, HTN, Osteoporosis    Examination-Activity Limitations Stand;Stairs;Squat;Bend;Carry    Examination-Participation Restrictions Shop;Community Activity;Cleaning    Stability/Clinical Decision Making Stable/Uncomplicated    Clinical Decision Making Low    Rehab Potential Good    PT Frequency 2x / week    PT Duration 8 weeks    PT Treatment/Interventions Aquatic Therapy;ADLs/Self Care Home Management;Moist Heat;Cryotherapy;Therapeutic activities;Therapeutic exercise;Balance training;Neuromuscular re-education;Passive range of motion;Dry needling;Manual techniques;Patient/family education;Stair training;Gait training;Joint Manipulations;Spinal Manipulations;Electrical Stimulation    PT Next Visit Plan Finish eval with functional  assessments . Progress hip strengthening and begin balance exercises.    Consulted and Agree with Plan of Care Patient            HEP includes the following:   Access Code: [email or web address] URL: [email or web address] Date: 01/11/2021 Prepared by: Bradly Chris  Exercises Supine Lower Trunk Rotation - 1 x daily - 3 x weekly - 3 sets - 10 reps Seated Hip External Rotation Stretch - 1 x daily - 7 x weekly - 1 sets - 3 reps - 60 hold Supine Bridge - 1 x daily - 3 x weekly - 3 sets - 10 reps Supine Straight Leg Raises - 1 x daily - 3 x weekly - 3 sets - 10 reps   Patient will benefit from skilled therapeutic intervention in order to improve the following deficits and impairments:  Abnormal gait, Impaired flexibility, Pain, Decreased strength, Decreased balance  Visit Diagnosis: Right-sided low back pain without sciatica, unspecified chronicity  Difficulty in walking, not elsewhere classified     Problem List Patient Active Problem List   Diagnosis Date Noted   Aortic atherosclerosis ([place]) 08/02/2020   Osteoarthritis of knees, bilateral 08/02/2020   Osteoporosis 08/02/2020   Arthritis 10/24/2014   HTN (hypertension) 10/24/2014   Asthma 10/24/2014   GERD (gastroesophageal reflux disease) 10/24/2014   Bradly Chris PT, DPT  01/11/2021, 2:15 PM  [facility] [address] [place], [address] Phone: [phone]   Fax:  [phone]  Name: Lacey Brown MRN: [id number] Date of Birth: [birth year]

## 2021-01-15 ENCOUNTER — Encounter: Payer: Self-pay | Admitting: Physical Therapy

## 2021-01-15 ENCOUNTER — Ambulatory Visit: Payer: Medicare HMO | Admitting: Physical Therapy

## 2021-01-15 DIAGNOSIS — M545 Low back pain, unspecified: Secondary | ICD-10-CM | POA: Diagnosis not present

## 2021-01-15 DIAGNOSIS — R262 Difficulty in walking, not elsewhere classified: Secondary | ICD-10-CM | POA: Diagnosis not present

## 2021-01-15 NOTE — Therapy (Signed)
[facility] [address], [place], [address] Phone: [phone]   Fax:  [phone]  Physical Therapy Treatment  Patient Details  Name: Lacey Brown MRN: [id number] Date of Birth: 20-Dec-1940 Referring Provider (PT): Dr. Saintclair Halsted   Encounter Date: 01/15/2021   PT End of Session - 01/16/21 0858     Visit Number 3    Number of Visits 16    Date for PT Re-Evaluation 01/08/21    PT Start Time 1540    PT Stop Time 1500    PT Time Calculation (min) 45 min    Equipment Utilized During Treatment Gait belt    Activity Tolerance Patient tolerated treatment well    Behavior During Therapy WFL for tasks assessed/performed             Past Medical History:  Diagnosis Date   Arthritis    back, neck; right shoulder;    Asthma    using advair prn   Chicken pox    Hyperlipidemia    Hypertension    controlled with medication;     Past Surgical History:  Procedure Laterality Date   BREAST EXCISIONAL BIOPSY Left    TONSILLECTOMY AND ADENOIDECTOMY      There were no vitals filed for this visit.   Subjective Assessment - 01/15/21 1419     Subjective Pt reports that she is experiencing increased low back pain at the start of today's session.    Pertinent History Dr. Candelaria Stagers 10/18/20:  The patient has intermittent buttock pain unknown etiology. She is less symptomatic today so it is difficult to narrow down the exact cause. I explained that she could have symptoms from radicular pain versus facet mediated pain versus myofascial pain versus SI joint mediated pain versus hip joint mediated pain versus myofascial pain specifically from the piriformis.  - She has no abnormalities to neurologic exam. She does have some decreased hip range of motion but this does not cause pain today. She has no tenderness to palpation today. Previous x-rays of her lumbar spine show some upper lumbar compression deformities as well  as multilevel degenerative disc and facet change. X-rays of her hips show-moderate right and mild left degenerative joint change. I discussed the possible causes of her symptoms and treatment options moving forward with activity modification, home exercise, physical therapy, medication steroid injections depending on the exact pain generator. Based on the intermittent nature of her symptoms and no major abnormality to physical exam aside for some decreased right hip motion, I recommended continuing with over-the-counter medication, massage,   9/14    Limitations Walking;Standing;House hold activities    How long can you sit comfortably? N/a    How long can you stand comfortably? Not a problem    How long can you walk comfortably? Difficulty with walking    Diagnostic tests CLINICAL DATA:  Low back pain.  History of arthritis.     EXAM:  LUMBAR SPINE - COMPLETE 4+ VIEW     COMPARISON:  No prior.     FINDINGS:  Lumbar spine numbered with the lowest segmented appearing lumbar  shaped vertebrae on lateral view as L5. Diffuse multilevel severe  degenerative change. Vacuum disc phenomena noted at L4-L5 and L5-S1.  Multiple mild thoracic compression fractures are noted. Prominent L1  compression fracture noted. Moderate L2 compression fracture noted.  Age of these compression fractures is undetermined. Aortoiliac  atherosclerotic vascular calcification. Pelvic calcifications  consistent phleboliths. Splenic calcifications, most  likely  granulomas noted. Questionable faint calcifications in the right  upper quadrant. These could be within the liver, gallbladder, or  right kidney.     IMPRESSION:  1. Multilevel severe degenerative change lumbar spine. Multiple  thoracic spine mild compression fractures are noted. Prominent L1  compression fracture. Moderate L2 compression fracture. Age of these  compression fractures undetermined.     2.  Aortoiliac atherosclerotic vascular disease.        Electronically Signed    By:  Marcello Moores  Register    On: 11/25/2017 17:36    Patient Stated Goals Wants to return to her normal walking    Currently in Pain? Yes    Pain Score 5     Pain Location Back    Pain Orientation Right;Left    Pain Descriptors / Indicators Aching    Pain Type Chronic pain    Pain Onset More than a month ago            THEREX:   Nu-Step Level 3 on the Seat and 3 on arms for 10 min   Seated Forward Bends 1 x 30 sec  Standing Forward Bends 3 x 30 sec  Mini-Squat 2 x 10  -min to mod VC to maintain knee behind toes   STEADI 4-Stage Balance Test: (inability to maintain tandem stance for 10 sec = increased risk for falls) - feet together, eyes open, noncompliant surface: 10/10 sec - semi-tandem stance, eyes open, noncompliant surface: 10/10 sec - tandem stances, eyes open, noncompliant surface: 5/10 sec   -Intermittent use of hands to steady herself.  - single leg stance: NT       NMR   Half-Tandem Stance 2 x 10 with horizontal head turns  Half- Tandem Stance 2 x 10 with vertical head turns        PT Education - 01/15/21 1421     Education Details form and technique for appropriate exercise    Person(s) Educated Patient    Methods Explanation;Demonstration;Verbal cues;Handout    Comprehension Verbalized understanding;Returned demonstration;Verbal cues required              PT Short Term Goals - 01/16/21 0853       PT SHORT TERM GOAL #1   Title Patient will demonstrate understanding of home exercise plan.    Baseline 01/08/21: NT    Time 2    Period Weeks    Status On-going    Target Date 01/22/21               PT Long Term Goals - 01/16/21 0853       PT LONG TERM GOAL #1   Title Patient will have improved function and activity level as evidenced by an increase in FOTO score by 10 points or more.    Baseline 01/08/21: 57/67    Time 8    Period Weeks    Status On-going    Target Date 03/05/21      PT LONG TERM GOAL #2   Title Patient will improve hip  and knee strength by 1 MMT to improve functional strength for increase stability while walking and to offload forces on low back.    Baseline 01/08/21: Hip Ext R/L 3/3 Hip Abd R/L 3+/3+ Hip Add R/L 3/3 Knee Ext R/L 3+/3+ Knee Flex R/L 4/4    Time 8    Period Weeks    Status On-going    Target Date 03/05/21      PT LONG TERM GOAL #  3   Title Patient will demonstrate symmetrical swing and stance time for improved loading of hip and low back and increased stability while walking.    Baseline 01/08/21: Antalgic gait on LLE with decrease step length on RLE and decreased stance time on RLE    Time 8    Period Weeks    Status On-going    Target Date 03/06/21                   Plan - 01/15/21 1447     Clinical Impression Statement Pt presents for f/u visit for low back pain that occured after limp that she had from ganglion cyst on left knee. She exhibits an increased risk for falls with decreased static balance especially when challenging vestibular component. LBP not relieved with stretches. Pt able to perform all exercises without an increase in her low back pain. PT advised that pt use rollator while ambulating in community to avoid falls.  She will continue to benefit form skilled PT to increase her hip strength and flexibility and to decrease low back pain    Personal Factors and Comorbidities Age;Comorbidity 3+    Comorbidities Knee OA, HTN, Osteoporosis    Examination-Activity Limitations Stand;Stairs;Squat;Bend;Carry    Examination-Participation Restrictions Shop;Community Activity;Cleaning    Stability/Clinical Decision Making Stable/Uncomplicated    Clinical Decision Making Low    Rehab Potential Good    PT Frequency 2x / week    PT Duration 8 weeks    PT Treatment/Interventions Aquatic Therapy;ADLs/Self Care Home Management;Moist Heat;Cryotherapy;Therapeutic activities;Therapeutic exercise;Balance training;Neuromuscular re-education;Passive range of motion;Dry needling;Manual  techniques;Patient/family education;Stair training;Gait training;Joint Manipulations;Spinal Manipulations;Electrical Stimulation    PT Next Visit Plan DGI test. Progress hip strengthening and balance tests.    PT [place] and Agree with Plan of Care Patient             HEP includes the following:   Access Code: [email or web address] URL: [email or web address] Date: 01/15/2021 Prepared by: Bradly Chris  Exercises Supine Lower Trunk Rotation - 1 x daily - 3 x weekly - 3 sets - 10 reps Seated Hip External Rotation Stretch - 1 x daily - 7 x weekly - 1 sets - 3 reps - 60 hold Forward Fold with Feet Together and Bent Legs - 1 x daily - 7 x weekly - 1 sets - 3 reps - 30 hold Supine Straight Leg Raises - 1 x daily - 3 x weekly - 3 sets - 10 reps Mini Squat - 1 x daily - 3 x weekly - 2 sets - 10 reps Half Tandem Stance Balance with Head Rotation - 1 x daily - 7 x weekly - 2 sets - 10 reps Half Tandem Stance Balance with Head Nods - 1 x daily - 7 x weekly - 2 sets - 10 reps    Patient will benefit from skilled therapeutic intervention in order to improve the following deficits and impairments:  Abnormal gait, Impaired flexibility, Pain, Decreased strength, Decreased balance  Visit Diagnosis: Right-sided low back pain without sciatica, unspecified chronicity  Difficulty in walking, not elsewhere classified     Problem List Patient Active Problem List   Diagnosis Date Noted   Aortic atherosclerosis ([place]) 08/02/2020   Osteoarthritis of knees, bilateral 08/02/2020   Osteoporosis 08/02/2020   Arthritis 10/24/2014   HTN (hypertension) 10/24/2014   Asthma 10/24/2014   GERD (gastroesophageal reflux disease) 10/24/2014   Bradly Chris PT, DPT  01/16/2021, 8:59 AM  [facility] [address], [place], [address] Phone: [phone]   Fax:  [phone]  Name: Lacey Brown MRN: [id number] Date of Birth: [birth year]

## 2021-01-15 NOTE — Telephone Encounter (Signed)
Patient scheduled.

## 2021-01-16 ENCOUNTER — Other Ambulatory Visit: Payer: Self-pay

## 2021-01-16 ENCOUNTER — Ambulatory Visit (HOSPITAL_COMMUNITY): Payer: Medicare HMO | Attending: Internal Medicine

## 2021-01-16 DIAGNOSIS — Z8249 Family history of ischemic heart disease and other diseases of the circulatory system: Secondary | ICD-10-CM | POA: Diagnosis not present

## 2021-01-16 DIAGNOSIS — I10 Essential (primary) hypertension: Secondary | ICD-10-CM

## 2021-01-16 DIAGNOSIS — Z87891 Personal history of nicotine dependence: Secondary | ICD-10-CM | POA: Insufficient documentation

## 2021-01-16 DIAGNOSIS — I77810 Thoracic aortic ectasia: Secondary | ICD-10-CM | POA: Diagnosis not present

## 2021-01-16 LAB — ECHOCARDIOGRAM COMPLETE
Area-P 1/2: 3.72 cm2
S' Lateral: 2.7 cm

## 2021-01-17 ENCOUNTER — Encounter: Payer: Medicare HMO | Admitting: Physical Therapy

## 2021-01-22 ENCOUNTER — Encounter: Payer: Self-pay | Admitting: Physical Therapy

## 2021-01-22 ENCOUNTER — Ambulatory Visit: Payer: Medicare HMO | Admitting: Physical Therapy

## 2021-01-22 DIAGNOSIS — M545 Low back pain, unspecified: Secondary | ICD-10-CM | POA: Diagnosis not present

## 2021-01-22 DIAGNOSIS — R262 Difficulty in walking, not elsewhere classified: Secondary | ICD-10-CM | POA: Diagnosis not present

## 2021-01-22 NOTE — Therapy (Signed)
[facility] [address] [place], [address] Phone: [phone]   Fax:  [phone]  Physical Therapy Treatment  Patient Details  Name: Lacey Brown MRN: [id number] Date of Birth: 1940-02-20 Referring Provider (PT): Dr. Saintclair Halsted   Encounter Date: 01/22/2021   PT End of Session - 01/22/21 1335     Visit Number 4    Number of Visits 16    Date for PT Re-Evaluation 01/08/21    PT Start Time 1325    PT Stop Time 1410    PT Time Calculation (min) 45 min    Equipment Utilized During Treatment Gait belt    Activity Tolerance Patient tolerated treatment well    Behavior During Therapy WFL for tasks assessed/performed             Past Medical History:  Diagnosis Date   Arthritis    back, neck; right shoulder;    Asthma    using advair prn   Chicken pox    Hyperlipidemia    Hypertension    controlled with medication;     Past Surgical History:  Procedure Laterality Date   BREAST EXCISIONAL BIOPSY Left    TONSILLECTOMY AND ADENOIDECTOMY      There were no vitals filed for this visit.   Subjective Assessment - 01/22/21 1336     Subjective Pt states that she is continuing to feel bilateral low back pain that has not improved since starting PT.    Pertinent History Dr. Candelaria Stagers 10/18/20:  The patient has intermittent buttock pain unknown etiology. She is less symptomatic today so it is difficult to narrow down the exact cause. I explained that she could have symptoms from radicular pain versus facet mediated pain versus myofascial pain versus SI joint mediated pain versus hip joint mediated pain versus myofascial pain specifically from the piriformis.  - She has no abnormalities to neurologic exam. She does have some decreased hip range of motion but this does not cause pain today. She has no tenderness to palpation today. Previous x-rays of her lumbar spine show some upper lumbar compression  deformities as well as multilevel degenerative disc and facet change. X-rays of her hips show-moderate right and mild left degenerative joint change. I discussed the possible causes of her symptoms and treatment options moving forward with activity modification, home exercise, physical therapy, medication steroid injections depending on the exact pain generator. Based on the intermittent nature of her symptoms and no major abnormality to physical exam aside for some decreased right hip motion, I recommended continuing with over-the-counter medication, massage,   9/14    Limitations Walking;Standing;House hold activities    How long can you sit comfortably? N/a    How long can you stand comfortably? Not a problem    How long can you walk comfortably? Difficulty with walking    Diagnostic tests CLINICAL DATA:  Low back pain.  History of arthritis.     EXAM:  LUMBAR SPINE - COMPLETE 4+ VIEW     COMPARISON:  No prior.     FINDINGS:  Lumbar spine numbered with the lowest segmented appearing lumbar  shaped vertebrae on lateral view as L5. Diffuse multilevel severe  degenerative change. Vacuum disc phenomena noted at L4-L5 and L5-S1.  Multiple mild thoracic compression fractures are noted. Prominent L1  compression fracture noted. Moderate L2 compression fracture noted.  Age of these compression fractures is undetermined. Aortoiliac  atherosclerotic vascular calcification. Pelvic calcifications  consistent phleboliths.  Splenic calcifications, most likely  granulomas noted. Questionable faint calcifications in the right  upper quadrant. These could be within the liver, gallbladder, or  right kidney.     IMPRESSION:  1. Multilevel severe degenerative change lumbar spine. Multiple  thoracic spine mild compression fractures are noted. Prominent L1  compression fracture. Moderate L2 compression fracture. Age of these  compression fractures undetermined.     2.  Aortoiliac atherosclerotic vascular disease.         Electronically Signed    By: Marcello Moores  Register    On: 11/25/2017 17:36    Patient Stated Goals Wants to return to her normal walking    Currently in Pain? Yes    Pain Score 6     Pain Location Back    Pain Orientation Left;Right    Pain Descriptors / Indicators Aching    Pain Type Chronic pain    Pain Onset More than a month ago             THEREX:  NU-STEP level 6 with 7 for arm grips  Supine Glute Max Stretch 2 x 60 sec   Standing Forward Trunk Flexion 2 x 60 sec   Long Sitting Butterfly Forward Flexion 2 x 60 sec   Supine Piriformis Stretch 3 x 60 sec  -min to mod TC to provide overpressure   Gait Analysis: Decreased step length on LLE with decreased stance time on RLE.  -Pt reports increased hip weakness on right hip.           PT Education - 01/22/21 1335     Education Details form and technique for appropriate exercise    Person(s) Educated Patient    Methods Explanation;Demonstration;Verbal cues;Handout    Comprehension Verbalized understanding;Returned demonstration;Verbal cues required;Tactile cues required              PT Short Term Goals - 01/22/21 1607       PT SHORT TERM GOAL #1   Title Patient will demonstrate understanding of home exercise plan.    Baseline 01/08/21: NT    Time 2    Period Weeks    Status On-going    Target Date 01/22/21               PT Long Term Goals - 01/22/21 1607       PT LONG TERM GOAL #1   Title Patient will have improved function and activity level as evidenced by an increase in FOTO score by 10 points or more.    Baseline 01/08/21: 57/67    Time 8    Period Weeks    Status On-going    Target Date 03/05/21      PT LONG TERM GOAL #2   Title Patient will improve hip and knee strength by 1 MMT to improve functional strength for increase stability while walking and to offload forces on low back.    Baseline 01/08/21: Hip Ext R/L 3/3 Hip Abd R/L 3+/3+ Hip Add R/L 3/3 Knee Ext R/L 3+/3+ Knee Flex R/L 4/4     Time 8    Period Weeks    Status On-going    Target Date 03/05/21      PT LONG TERM GOAL #3   Title Patient will demonstrate symmetrical swing and stance time for improved loading of hip and low back and increased stability while walking.    Baseline 01/08/21: Antalgic gait on LLE with decrease step length on RLE and decreased stance time on RLE  Time 8    Period Weeks    Status On-going    Target Date 03/06/21                   Plan - 01/22/21 1350     Clinical Impression Statement Pt presents for f/u visit for low back pain and imbalance. Continue to have difficulty isolating anatomical original of pain and differentiating between muscular versus bone. Hip stretches did appear to touch patients concordant pain, but did not relieve this pain after performing. She also continues to have antalgic gait that is causing her to be off balance.  She will continue to benefit form skilled PT to increase her hip strength and flexibility and to decrease low back pain to ambulate with increased steadiness.    Personal Factors and Comorbidities Age;Comorbidity 3+    Comorbidities Knee OA, HTN, Osteoporosis    Examination-Activity Limitations Stand;Stairs;Squat;Bend;Carry    Examination-Participation Restrictions Shop;Community Activity;Cleaning    Stability/Clinical Decision Making Stable/Uncomplicated    Rehab Potential Good    PT Frequency 2x / week    PT Duration 8 weeks    PT Treatment/Interventions Aquatic Therapy;ADLs/Self Care Home Management;Moist Heat;Cryotherapy;Therapeutic activities;Therapeutic exercise;Balance training;Neuromuscular re-education;Passive range of motion;Dry needling;Manual techniques;Patient/family education;Stair training;Gait training;Joint Manipulations;Spinal Manipulations;Electrical Stimulation    PT Next Visit Plan Gait analysis for further understanding of dynamic gait deficits.    PT [place] and Agree with Plan of  Care Patient            HEP includes the following:     Access Code: [email or web address] URL: [email or web address] Date: 01/22/2021 Prepared by: Bradly Chris  Exercises Supine Figure 4 Piriformis Stretch - 1 x daily - 7 x weekly - 1 sets - 3 reps - 60 hold Supine Hip External Rotation Stretch - 1 x daily - 7 x weekly - 1 sets - 3 reps - 60 hold Supine Straight Leg Raises - 1 x daily - 3 x weekly - 3 sets - 10 reps Butterfly Groin Stretch - 1 x daily - 7 x weekly - 1 sets - 3 reps - 60 hold Standing Forward Trunk Flexion - 1 x daily - 7 x weekly - 1 sets - 3 reps - 60 hold Mini Squat - 1 x daily - 3 x weekly - 2 sets - 10 reps Half Tandem Stance Balance with Head Rotation - 1 x daily - 7 x weekly - 2 sets - 10 reps Half Tandem Stance Balance with Head Nods - 1 x daily - 7 x weekly - 2 sets - 10 reps   Patient will benefit from skilled therapeutic intervention in order to improve the following deficits and impairments:  Abnormal gait, Impaired flexibility, Pain, Decreased strength, Decreased balance  Visit Diagnosis: Right-sided low back pain without sciatica, unspecified chronicity  Difficulty in walking, not elsewhere classified     Problem List Patient Active Problem List   Diagnosis Date Noted   Aortic atherosclerosis ([place]) 08/02/2020   Osteoarthritis of knees, bilateral 08/02/2020   Osteoporosis 08/02/2020   Arthritis 10/24/2014   HTN (hypertension) 10/24/2014   Asthma 10/24/2014   GERD (gastroesophageal reflux disease) 10/24/2014   Bradly Chris PT, DPT  01/22/2021, 4:11 PM  [facility] [address] [place], [address] Phone: [phone]   Fax:  [phone]  Name: Cherissa Hook MRN: [id number] Date of Birth: 10-02-1940

## 2021-01-24 ENCOUNTER — Ambulatory Visit: Payer: Medicare HMO | Admitting: Physical Therapy

## 2021-01-30 ENCOUNTER — Encounter: Payer: Medicare HMO | Admitting: Physical Therapy

## 2021-02-01 ENCOUNTER — Ambulatory Visit (INDEPENDENT_AMBULATORY_CARE_PROVIDER_SITE_OTHER): Payer: Medicare HMO

## 2021-02-01 ENCOUNTER — Ambulatory Visit: Payer: Medicare HMO | Admitting: Physical Therapy

## 2021-02-01 ENCOUNTER — Other Ambulatory Visit: Payer: Self-pay

## 2021-02-01 DIAGNOSIS — M545 Low back pain, unspecified: Secondary | ICD-10-CM | POA: Diagnosis not present

## 2021-02-01 DIAGNOSIS — M810 Age-related osteoporosis without current pathological fracture: Secondary | ICD-10-CM

## 2021-02-01 DIAGNOSIS — R262 Difficulty in walking, not elsewhere classified: Secondary | ICD-10-CM | POA: Diagnosis not present

## 2021-02-01 MED ORDER — DENOSUMAB 60 MG/ML ~~LOC~~ SOSY
60.0000 mg | PREFILLED_SYRINGE | Freq: Once | SUBCUTANEOUS | Status: AC
  Administered 2021-02-01: 12:00:00 60 mg via SUBCUTANEOUS

## 2021-02-01 NOTE — Progress Notes (Signed)
Lacey Brown presents today for injection per MD orders. Prolia injection administered SQ in left Upper Arm. Administration without incident. Patient tolerated well.  Kuron Docken,cma

## 2021-02-01 NOTE — Therapy (Signed)
[facility] [address] [place], [address] Phone: [phone]   Fax:  [phone]  Physical Therapy Treatment  Patient Details  Name: Lacey Brown MRN: [id number] Date of Birth: 08-Aug-1940 Referring Provider (PT): Dr. Saintclair Halsted   Encounter Date: 02/01/2021   PT End of Session - 02/01/21 1642     Visit Number 5    Number of Visits 16    Date for PT Re-Evaluation 01/08/21    PT Start Time 1500    PT Stop Time 1545    PT Time Calculation (min) 45 min    Equipment Utilized During Treatment Gait belt    Activity Tolerance Patient tolerated treatment well    Behavior During Therapy WFL for tasks assessed/performed             Past Medical History:  Diagnosis Date   Arthritis    back, neck; right shoulder;    Asthma    using advair prn   Chicken pox    Hyperlipidemia    Hypertension    controlled with medication;     Past Surgical History:  Procedure Laterality Date   BREAST EXCISIONAL BIOPSY Left    TONSILLECTOMY AND ADENOIDECTOMY      There were no vitals filed for this visit.   Subjective Assessment - 02/01/21 1507     Subjective Pt states that she is still experiencing low back pain and that it hurts most when she first stands up from sitting and walking for long periods of time. She has had PT in the past for her low back at [facility], but she has not continued HEP.    Pertinent History Dr. Candelaria Stagers 10/18/20:  The patient has intermittent buttock pain unknown etiology. She is less symptomatic today so it is difficult to narrow down the exact cause. I explained that she could have symptoms from radicular pain versus facet mediated pain versus myofascial pain versus SI joint mediated pain versus hip joint mediated pain versus myofascial pain specifically from the piriformis.  - She has no abnormalities to neurologic exam. She does have some decreased hip range of motion but this does not cause  pain today. She has no tenderness to palpation today. Previous x-rays of her lumbar spine show some upper lumbar compression deformities as well as multilevel degenerative disc and facet change. X-rays of her hips show-moderate right and mild left degenerative joint change. I discussed the possible causes of her symptoms and treatment options moving forward with activity modification, home exercise, physical therapy, medication steroid injections depending on the exact pain generator. Based on the intermittent nature of her symptoms and no major abnormality to physical exam aside for some decreased right hip motion, I recommended continuing with over-the-counter medication, massage,   9/14    Limitations Walking;Standing;House hold activities    How long can you sit comfortably? N/a    How long can you stand comfortably? Not a problem    How long can you walk comfortably? Difficulty with walking    Diagnostic tests CLINICAL DATA:  Low back pain.  History of arthritis.     EXAM:  LUMBAR SPINE - COMPLETE 4+ VIEW     COMPARISON:  No prior.     FINDINGS:  Lumbar spine numbered with the lowest segmented appearing lumbar  shaped vertebrae on lateral view as L5. Diffuse multilevel severe  degenerative change. Vacuum disc phenomena noted at L4-L5 and L5-S1.  Multiple mild thoracic compression fractures are noted.  Prominent L1  compression fracture noted. Moderate L2 compression fracture noted.  Age of these compression fractures is undetermined. Aortoiliac  atherosclerotic vascular calcification. Pelvic calcifications  consistent phleboliths. Splenic calcifications, most likely  granulomas noted. Questionable faint calcifications in the right  upper quadrant. These could be within the liver, gallbladder, or  right kidney.     IMPRESSION:  1. Multilevel severe degenerative change lumbar spine. Multiple  thoracic spine mild compression fractures are noted. Prominent L1  compression fracture. Moderate L2 compression  fracture. Age of these  compression fractures undetermined.     2.  Aortoiliac atherosclerotic vascular disease.        Electronically Signed    By: Marcello Moores  Register    On: 11/25/2017 17:36    Patient Stated Goals Wants to return to her normal walking    Currently in Pain? Yes    Pain Score 7     Pain Location Back    Pain Orientation Left;Right    Pain Descriptors / Indicators Aching    Pain Type Chronic pain    Pain Onset More than a month ago             THEREX:  Nu-Step 10 min at 2 resistance and level 6 seat and 7 for arm paddles   Lumbar mobility warm-up flexion x 10, extension x 10, Sidebending Right x 10, Sidebending x 10, Rotation Right x 10, Rotation Left x 10    Standing Hip Abduction 1 x 10  -Pt demonstrates excessive trunk lean and increase pain in her left hip.   Sidelying Clamshells 3 x 10  Supine Hip ER stretch 3 x 60 sec   Mini-Squats 1 x 10  -Pain in low back and left knee pain   Supine Bridges 2 x 10     Gait Analysis: Bilateral hip drop with right hike to clear swing leg.              PT Education - 02/01/21 1511     Education Details form and technique for appropriate exercise    Person(s) Educated Patient    Methods Explanation;Demonstration;Handout;Verbal cues              PT Short Term Goals - 02/01/21 1512       PT SHORT TERM GOAL #1   Title Patient will demonstrate understanding of home exercise plan.    Baseline 01/08/21: NT    Time 2    Period Weeks    Status On-going    Target Date 01/22/21               PT Long Term Goals - 02/01/21 1512       PT LONG TERM GOAL #1   Title Patient will have improved function and activity level as evidenced by an increase in FOTO score by 10 points or more.    Baseline 01/08/21: 57/67    Time 8    Period Weeks    Status On-going    Target Date 03/05/21      PT LONG TERM GOAL #2   Title Patient will improve hip and knee strength by 1 MMT to improve functional strength  for increase stability while walking and to offload forces on low back.    Baseline 01/08/21: Hip Ext R/L 3/3 Hip Abd R/L 3+/3+ Hip Add R/L 3/3 Knee Ext R/L 3+/3+ Knee Flex R/L 4/4    Time 8    Period Weeks    Status On-going    Target  Date 03/05/21      PT LONG TERM GOAL #3   Title Patient will demonstrate symmetrical swing and stance time for improved loading of hip and low back and increased stability while walking.    Baseline 01/08/21: Antalgic gait on LLE with decrease step length on RLE and decreased stance time on RLE    Time 8    Period Weeks    Status On-going    Target Date 03/06/21                   Plan - 02/01/21 1642     Clinical Impression Statement Pt presents for f/u visit for low back pain and imbalance. She continues to have right sided hip pain that is likley the result of hip hiking compensation, because of left sided hip drop. She was able to tolerate all exercises without an increase in her hip or low back pain. Pt will continue to benefit from PT to decrease her low back and right hip pain and normalize her gait to maintain her independence by reducing her risk of falling.    Personal Factors and Comorbidities Age;Comorbidity 3+    Comorbidities Knee OA, HTN, Osteoporosis    Examination-Activity Limitations Stand;Stairs;Squat;Bend;Carry    Examination-Participation Restrictions Shop;Community Activity;Cleaning    Stability/Clinical Decision Making Stable/Uncomplicated    Rehab Potential Good    PT Frequency 2x / week    PT Duration 8 weeks    PT Treatment/Interventions Aquatic Therapy;ADLs/Self Care Home Management;Moist Heat;Cryotherapy;Therapeutic activities;Therapeutic exercise;Balance training;Neuromuscular re-education;Passive range of motion;Dry needling;Manual techniques;Patient/family education;Stair training;Gait training;Joint Manipulations;Spinal Manipulations;Electrical Stimulation    PT Next Visit Plan QL stretch and palpation to determin  whether it is a pain generator    PT [place] and Agree with Plan of Care Patient             Patient will benefit from skilled therapeutic intervention in order to improve the following deficits and impairments:  Abnormal gait, Impaired flexibility, Pain, Decreased strength, Decreased balance  Visit Diagnosis: Right-sided low back pain without sciatica, unspecified chronicity  Difficulty in walking, not elsewhere classified     Problem List Patient Active Problem List   Diagnosis Date Noted   Aortic atherosclerosis ([place]) 08/02/2020   Osteoarthritis of knees, bilateral 08/02/2020   Osteoporosis 08/02/2020   Arthritis 10/24/2014   HTN (hypertension) 10/24/2014   Asthma 10/24/2014   GERD (gastroesophageal reflux disease) 10/24/2014   Bradly Chris PT, DPT  02/01/2021, 5:26 PM  [facility] [address], [place], [address] Phone: [phone]   Fax:  [phone]  Name: Leni Pankonin MRN: [id number] Date of Birth: 04-10-1940

## 2021-02-07 ENCOUNTER — Ambulatory Visit: Payer: Medicare HMO | Attending: Student | Admitting: Physical Therapy

## 2021-02-07 DIAGNOSIS — M545 Low back pain, unspecified: Secondary | ICD-10-CM | POA: Insufficient documentation

## 2021-02-07 DIAGNOSIS — R262 Difficulty in walking, not elsewhere classified: Secondary | ICD-10-CM | POA: Diagnosis not present

## 2021-02-07 NOTE — Therapy (Signed)
[facility] [address] [place], [address] Phone: [phone]   Fax:  [phone]  Physical Therapy Treatment  Patient Details  Name: Deanndra Kirley MRN: [id number] Date of Birth: [birth year] Referring Provider (PT): Dr. Saintclair Halsted   Encounter Date: 02/07/2021   PT End of Session - 02/07/21 1335     Visit Number 6    Number of Visits 16    Date for PT Re-Evaluation 01/08/21    PT Start Time 1330    PT Stop Time 1415    PT Time Calculation (min) 45 min    Equipment Utilized During Treatment Gait belt    Activity Tolerance Patient tolerated treatment well    Behavior During Therapy WFL for tasks assessed/performed             Past Medical History:  Diagnosis Date   Arthritis    back, neck; right shoulder;    Asthma    using advair prn   Chicken pox    Hyperlipidemia    Hypertension    controlled with medication;     Past Surgical History:  Procedure Laterality Date   BREAST EXCISIONAL BIOPSY Left    TONSILLECTOMY AND ADENOIDECTOMY      There were no vitals filed for this visit.   Subjective Assessment - 02/07/21 1333     Subjective Pt reports that she is continuing to have ongoing low back pain and stiffness. She also feels soreness in the front of her thighs.    Pertinent History Dr. Candelaria Stagers 10/18/20:  The patient has intermittent buttock pain unknown etiology. She is less symptomatic today so it is difficult to narrow down the exact cause. I explained that she could have symptoms from radicular pain versus facet mediated pain versus myofascial pain versus SI joint mediated pain versus hip joint mediated pain versus myofascial pain specifically from the piriformis.  - She has no abnormalities to neurologic exam. She does have some decreased hip range of motion but this does not cause pain today. She has no tenderness to palpation today. Previous x-rays of her lumbar spine show some upper  lumbar compression deformities as well as multilevel degenerative disc and facet change. X-rays of her hips show-moderate right and mild left degenerative joint change. I discussed the possible causes of her symptoms and treatment options moving forward with activity modification, home exercise, physical therapy, medication steroid injections depending on the exact pain generator. Based on the intermittent nature of her symptoms and no major abnormality to physical exam aside for some decreased right hip motion, I recommended continuing with over-the-counter medication, massage,   9/14    Limitations Walking;Standing;House hold activities    How long can you sit comfortably? N/a    How long can you stand comfortably? Not a problem    How long can you walk comfortably? Difficulty with walking    Diagnostic tests CLINICAL DATA:  Low back pain.  History of arthritis.     EXAM:  LUMBAR SPINE - COMPLETE 4+ VIEW     COMPARISON:  No prior.     FINDINGS:  Lumbar spine numbered with the lowest segmented appearing lumbar  shaped vertebrae on lateral view as L5. Diffuse multilevel severe  degenerative change. Vacuum disc phenomena noted at L4-L5 and L5-S1.  Multiple mild thoracic compression fractures are noted. Prominent L1  compression fracture noted. Moderate L2 compression fracture noted.  Age of these compression fractures is undetermined. Aortoiliac  atherosclerotic vascular calcification.  Pelvic calcifications  consistent phleboliths. Splenic calcifications, most likely  granulomas noted. Questionable faint calcifications in the right  upper quadrant. These could be within the liver, gallbladder, or  right kidney.     IMPRESSION:  1. Multilevel severe degenerative change lumbar spine. Multiple  thoracic spine mild compression fractures are noted. Prominent L1  compression fracture. Moderate L2 compression fracture. Age of these  compression fractures undetermined.     2.  Aortoiliac atherosclerotic vascular  disease.        Electronically Signed    By: Marcello Moores  Register    On: 11/25/2017 17:36    Patient Stated Goals Wants to return to her normal walking    Currently in Pain? No/denies    Pain Onset More than a month ago            THEREX:  MMT  Hip Ext R/L 4/4 Hip Abd R/L 4/3+ Hip Add R/L 4/4 Knee Ext R/L 4+/4+ Knee Flex R/L 4/4  Gait Analysis: Antalgic gait with bilateral hip drop with decreased step length on right.                     PT Education - 02/07/21 1334     Education Details form and technique for appropriate exercise    Person(s) Educated Patient    Methods Explanation;Demonstration;Verbal cues;Handout    Comprehension Verbalized understanding;Returned demonstration;Verbal cues required              PT Short Term Goals - 02/07/21 1336       PT SHORT TERM GOAL #1   Title Patient will demonstrate understanding of home exercise plan.    Baseline 01/08/21: NT    Time 2    Period Weeks    Status Achieved    Target Date 01/22/21               PT Long Term Goals - 02/07/21 1336       PT LONG TERM GOAL #1   Title Patient will have improved function and activity level as evidenced by an increase in FOTO score by 10 points or more.    Baseline 01/08/21: 57/67    Time 8    Period Weeks    Status On-going    Target Date 03/05/21      PT LONG TERM GOAL #2   Title Patient will improve hip and knee strength by 1 MMT to improve functional strength for increase stability while walking and to offload forces on low back.    Baseline 01/08/21: Hip Ext R/L 3/3 Hip Abd R/L 3+/3+ Hip Add R/L 3/3 Knee Ext R/L 3+/3+ Knee Flex R/L 4/4 02/07/21: Hip Ext R/L 4/4 Hip Abd R/L 4/3+ Hip Add R/L 4/4 Knee Ext R/L 4+/4+ Knee Flex R/L 4/4    Time 8    Period Weeks    Status On-going    Target Date 03/05/21      PT LONG TERM GOAL #3   Title Patient will demonstrate symmetrical swing and stance time for improved loading of hip and low back and increased stability  while walking.    Baseline 01/08/21: Antalgic gait on LLE with decrease step length on RLE and decreased stance time on RLE    Time 8    Period Weeks    Status On-going    Target Date 03/06/21                   Plan - 02/07/21 1335  Clinical Impression Statement Pt presents for f/u one month from eval and progress note. She has improved her hip strength, but still exhibits abnormal gait mechanics with decreased step length on RLE and bilateral hip drop. She also continues to experience left hip pain especially when ambulating. Given lack of progress, PT discussed plan of care options and pt elected to attempt three more visits over next three weeks with a focus on gait training to improve gait mechanics to decrease her risk of falling and reduce left hip and low back pain. If no improvement after three weeks, then patient will be referred back to referring provider for further evaluation.    Personal Factors and Comorbidities Age;Comorbidity 3+    Comorbidities Knee OA, HTN, Osteoporosis    Examination-Activity Limitations Stand;Stairs;Squat;Bend;Carry    Examination-Participation Restrictions Shop;Community Activity;Cleaning    Stability/Clinical Decision Making Stable/Uncomplicated    Clinical Decision Making Low    Rehab Potential Good    PT Frequency 2x / week    PT Duration 8 weeks    PT Treatment/Interventions Aquatic Therapy;ADLs/Self Care Home Management;Moist Heat;Cryotherapy;Therapeutic activities;Therapeutic exercise;Balance training;Neuromuscular re-education;Passive range of motion;Dry needling;Manual techniques;Patient/family education;Stair training;Gait training;Joint Manipulations;Spinal Manipulations;Electrical Stimulation    PT Next Visit Plan QL Stretch. Further analysis of gait and trial using a AD to determine whether pain with use of SPC or rollator.    PT Home Exercise Plan [id number]    Consulted and Agree with Plan of Care Patient             Patient  will benefit from skilled therapeutic intervention in order to improve the following deficits and impairments:  Abnormal gait, Impaired flexibility, Pain, Decreased strength, Decreased balance  Visit Diagnosis: Difficulty in walking, not elsewhere classified  Right-sided low back pain without sciatica, unspecified chronicity     Problem List Patient Active Problem List   Diagnosis Date Noted   Aortic atherosclerosis ([place]) 08/02/2020   Osteoarthritis of knees, bilateral 08/02/2020   Osteoporosis 08/02/2020   Arthritis 10/24/2014   HTN (hypertension) 10/24/2014   Asthma 10/24/2014   GERD (gastroesophageal reflux disease) 10/24/2014   Bradly Chris PT, DPT  02/07/2021, 2:38 PM  [facility] [address], [place], [address] Phone: [phone]   Fax:  [phone]  Name: Djuna Frechette MRN: [id number] Date of Birth: July 10, 1940

## 2021-02-08 ENCOUNTER — Encounter: Payer: Medicare HMO | Admitting: Physical Therapy

## 2021-02-08 NOTE — Addendum Note (Signed)
Addended by: Daneil Dan on: 02/08/2021 04:46 PM   Modules accepted: Orders

## 2021-02-09 ENCOUNTER — Telehealth: Payer: Self-pay | Admitting: Internal Medicine

## 2021-02-09 NOTE — Telephone Encounter (Signed)
Spoke with pt and informed her that she can take both bp medications at the same time. Pt gave a verbal understanding. Also scheduled pt for a follow up with Dr. Derrel Nip in February.

## 2021-02-09 NOTE — Telephone Encounter (Signed)
Pt called in stating that Dr. Derrel Nip advise Pt to take medication (telmisartan (MICARDIS) 80 MG tablet) and (metoprolol succinate (TOPROL-XL) 25 MG 24 hr tablet) at different times. Pt was wondering is it ok for her to take medications at the same time. Pt requesting callback.

## 2021-02-13 ENCOUNTER — Ambulatory Visit: Payer: Medicare HMO | Admitting: Physical Therapy

## 2021-02-13 DIAGNOSIS — R262 Difficulty in walking, not elsewhere classified: Secondary | ICD-10-CM

## 2021-02-13 DIAGNOSIS — M545 Low back pain, unspecified: Secondary | ICD-10-CM | POA: Diagnosis not present

## 2021-02-13 NOTE — Therapy (Addendum)
[facility] [address] [place], [address] Phone: [phone]   Fax:  [phone]  Physical Therapy Treatment  Patient Details  Name: Lacey Brown MRN: [id number] Date of Birth: [birth year] Referring Provider (PT): Dr. Saintclair Halsted   Encounter Date: 02/13/2021  PT End of Session  Row Name 02/14/21 1034      PT Visits / Re-Eval  Visit Number 7      Number of Visits 16      Date for PT Re-Evaluation 03/05/21      Authorization  Authorization Type [employer] 2023      PT Time Calculation  PT Start Time 1330      PT Stop Time 1415      PT Time Calculation (min) 45 min      PT - End of Session  Equipment Utilized During Treatment Gait belt      Activity Tolerance Patient tolerated treatment well      Behavior During Therapy WFL for tasks assessed/performed         Past Medical History:  Diagnosis Date   Arthritis    back, neck; right shoulder;    Asthma    using advair prn   Chicken pox    Hyperlipidemia    Hypertension    controlled with medication;     Past Surgical History:  Procedure Laterality Date   BREAST EXCISIONAL BIOPSY Left    TONSILLECTOMY AND ADENOIDECTOMY      There were no vitals filed for this visit.   Subjective Assessment - 02/13/21 1428     Subjective She states that she is now having pain and numbness in tingling in the front of her thighs and has the pain even at rest. She continues to walk with imbalance with left hip instability and pain.    Pertinent History Dr. Candelaria Stagers 10/18/20:  The patient has intermittent buttock pain unknown etiology. She is less symptomatic today so it is difficult to narrow down the exact cause. I explained that she could have symptoms from radicular pain versus facet mediated pain versus myofascial pain versus SI joint mediated pain versus hip joint mediated pain versus myofascial pain specifically from the piriformis.  - She has no  abnormalities to neurologic exam. She does have some decreased hip range of motion but this does not cause pain today. She has no tenderness to palpation today. Previous x-rays of her lumbar spine show some upper lumbar compression deformities as well as multilevel degenerative disc and facet change. X-rays of her hips show-moderate right and mild left degenerative joint change. I discussed the possible causes of her symptoms and treatment options moving forward with activity modification, home exercise, physical therapy, medication steroid injections depending on the exact pain generator. Based on the intermittent nature of her symptoms and no major abnormality to physical exam aside for some decreased right hip motion, I recommended continuing with over-the-counter medication, massage,   9/14    Limitations Walking;Standing;House hold activities    How long can you sit comfortably? N/a    How long can you stand comfortably? Not a problem    How long can you walk comfortably? Difficulty with walking    Diagnostic tests CLINICAL DATA:  Low back pain.  History of arthritis.     EXAM:  LUMBAR SPINE - COMPLETE 4+ VIEW     COMPARISON:  No prior.     FINDINGS:  Lumbar spine numbered with the lowest segmented appearing lumbar  shaped vertebrae on lateral view as L5. Diffuse multilevel severe  degenerative change. Vacuum disc phenomena noted at L4-L5 and L5-S1.  Multiple mild thoracic compression fractures are noted. Prominent L1  compression fracture noted. Moderate L2 compression fracture noted.  Age of these compression fractures is undetermined. Aortoiliac  atherosclerotic vascular calcification. Pelvic calcifications  consistent phleboliths. Splenic calcifications, most likely  granulomas noted. Questionable faint calcifications in the right  upper quadrant. These could be within the liver, gallbladder, or  right kidney.     IMPRESSION:  1. Multilevel severe degenerative change lumbar spine. Multiple  thoracic  spine mild compression fractures are noted. Prominent L1  compression fracture. Moderate L2 compression fracture. Age of these  compression fractures undetermined.     2.  Aortoiliac atherosclerotic vascular disease.        Electronically Signed    By: Marcello Moores  Register    On: 11/25/2017 17:36    Patient Stated Goals Wants to return to her normal walking    Currently in Pain? Yes    Pain Score 8     Pain Location Back    Pain Orientation Right;Left    Pain Descriptors / Indicators Aching    Pain Type Chronic pain    Pain Onset More than a month ago    Pain Frequency Intermittent            MANUAL THERAPY:  Left glute med trigger point release    THEREX:  Nu-Step 5 min at 2 resistance   Standing Marches 3 x 10 with BUE support  Clam Shells 3 x 10   Discussion about ongoing plan of care and need for further medical evaluation. Modification to HEP to follow as bridge to further medical care.                    PT Education - 02/14/21 1033     Education Details form and technique for appropriate exercise. Explanation about plan of care upon discharge and need for futher medical eval and treatment now that conservative care has been exhausted.    Person(s) Educated Patient    Methods Explanation;Demonstration;Verbal cues;Tactile cues;Handout    Comprehension Verbalized understanding;Returned demonstration;Verbal cues required              PT Short Term Goals - 02/14/21 1042       PT SHORT TERM GOAL #1   Title Patient will demonstrate understanding of home exercise plan.    Baseline 01/08/21: NT    Time 2    Period Weeks    Status Achieved    Target Date 01/22/21               PT Long Term Goals - 02/13/21 1412       PT LONG TERM GOAL #1   Title Patient will have improved function and activity level as evidenced by an increase in FOTO score by 10 points or more.    Baseline 01/08/21: 57/67 02/07/21:55/67    Time 8    Period Weeks    Status Not  Met    Target Date 03/05/21      PT LONG TERM GOAL #2   Title Patient will improve hip and knee strength by 1 MMT to improve functional strength for increase stability while walking and to offload forces on low back.    Baseline 01/08/21: Hip Ext R/L 3/3 Hip Abd R/L 3+/3+ Hip Add R/L 3/3 Knee Ext R/L 3+/3+ Knee Flex R/L 4/4 02/07/21: Hip Ext  R/L 4/4 Hip Abd R/L 4/3+ Hip Add R/L 4/4 Knee Ext R/L 4+/4+ Knee Flex R/L 4/4    Time 8    Period Weeks    Status Partially Met    Target Date 03/05/21      PT LONG TERM GOAL #3   Title Patient will demonstrate symmetrical swing and stance time for improved loading of hip and low back and increased stability while walking.    Baseline 01/08/21: Antalgic gait on LLE with decrease step length on RLE and decreased stance time on RLE 02/13/21: Trendlenberg gait on right and left, circumduction swing on RLE    Time 8    Period Weeks    Status Not Met    Target Date 03/06/21                   Plan - 02/13/21 1412     Clinical Impression Statement Pt presents for f/u and final apt for chronic low back pain and left hip pain with difficulty walking. Despite ongoing PT, pt continues to experience chronic low back pain and left hip pain that has not decreased with intensity. He symptoms of pain and tingling that radiate from low back down to quadriceps suggest neurogenic claudication and left hip pain appears to be glute med/min tendonitis given pain with left hip abduction and trendlenberg gait. Pt's gait remains unstable due to left pain weakness, and she would like further medical management with imaging to determine source of low back pain and left hip pain. She will benefit from further medical management and she will be discharged with HEP that will target strengthening muscles to improve gait stability. Pt will be referred back to referring provider for further medical management.    Personal Factors and Comorbidities Age;Comorbidity 3+    Comorbidities  Knee OA, HTN, Osteoporosis    Examination-Activity Limitations Stand;Stairs;Squat;Bend;Carry    Examination-Participation Restrictions Shop;Community Activity;Cleaning    Stability/Clinical Decision Making Stable/Uncomplicated    Clinical Decision Making Low    Rehab Potential Good    PT Frequency 2x / week    PT Duration 8 weeks    PT Treatment/Interventions Aquatic Therapy;ADLs/Self Care Home Management;Moist Heat;Cryotherapy;Therapeutic activities;Therapeutic exercise;Balance training;Neuromuscular re-education;Passive range of motion;Dry needling;Manual techniques;Patient/family education;Stair training;Gait training;Joint Manipulations;Spinal Manipulations;Electrical Stimulation    PT Next Visit Plan N/a discharge    PT [place]    Recommended Other Services Further medical management from referring provider    Consulted and Agree with Plan of Care Patient            HEP includes:  Access Code: [email or web address] URL: [email or web address] Date: 02/13/2021 Prepared by: Bradly Chris  Exercises Butterfly Groin Stretch - 1 x daily - 7 x weekly - 1 sets - 3 reps - 60 hold Clamshell - 1 x daily - 3 x weekly - 3 sets - 10 reps Supine Bridge - 1 x daily - 3 x weekly - 3 sets - 10 reps Standing Forward Trunk Flexion - 1 x daily - 7 x weekly - 1 sets - 3 reps - 60 hold Standing Hip Flexion March - 1 x daily - 3 x weekly - 3 sets - 10 reps Standing Hip Abduction with Counter Support - 1 x daily - 3 x weekly - 3 sets - 10 reps Seated Hip External Rotation Stretch - 1 x daily - 7 x weekly - 1 sets - 3 reps - 60 hold    Patient will benefit from skilled therapeutic intervention in order  to improve the following deficits and impairments:  Abnormal gait, Impaired flexibility, Pain, Decreased strength, Decreased balance  Visit Diagnosis: Difficulty in walking, not elsewhere classified  Right-sided low back pain without sciatica, unspecified  chronicity     Problem List Patient Active Problem List   Diagnosis Date Noted   Aortic atherosclerosis ([place]) 08/02/2020   Osteoarthritis of knees, bilateral 08/02/2020   Osteoporosis 08/02/2020   Arthritis 10/24/2014   HTN (hypertension) 10/24/2014   Asthma 10/24/2014   GERD (gastroesophageal reflux disease) 10/24/2014   Bradly Chris PT, DPT  02/14/2021, 12:48 PM  [facility] [address] [place], [address] Phone: [phone]   Fax:  [phone]  Name: Lacey Brown MRN: [id number] Date of Birth: February 03, 1941

## 2021-02-14 ENCOUNTER — Encounter: Payer: Self-pay | Admitting: Physical Therapy

## 2021-02-15 ENCOUNTER — Ambulatory Visit: Payer: Medicare HMO | Admitting: Physical Therapy

## 2021-02-19 ENCOUNTER — Ambulatory Visit (INDEPENDENT_AMBULATORY_CARE_PROVIDER_SITE_OTHER): Payer: Medicare HMO | Admitting: Family

## 2021-02-19 ENCOUNTER — Ambulatory Visit (INDEPENDENT_AMBULATORY_CARE_PROVIDER_SITE_OTHER): Payer: Medicare HMO

## 2021-02-19 ENCOUNTER — Other Ambulatory Visit: Payer: Self-pay

## 2021-02-19 ENCOUNTER — Encounter: Payer: Self-pay | Admitting: Family

## 2021-02-19 VITALS — BP 142/78 | HR 66 | Temp 97.5°F | Ht 59.0 in | Wt 167.0 lb

## 2021-02-19 DIAGNOSIS — M79675 Pain in left toe(s): Secondary | ICD-10-CM | POA: Diagnosis not present

## 2021-02-19 DIAGNOSIS — L03032 Cellulitis of left toe: Secondary | ICD-10-CM

## 2021-02-19 DIAGNOSIS — R600 Localized edema: Secondary | ICD-10-CM | POA: Insufficient documentation

## 2021-02-19 DIAGNOSIS — M7989 Other specified soft tissue disorders: Secondary | ICD-10-CM | POA: Diagnosis not present

## 2021-02-19 LAB — COMPREHENSIVE METABOLIC PANEL
ALT: 12 U/L (ref 0–35)
AST: 13 U/L (ref 0–37)
Albumin: 4 g/dL (ref 3.5–5.2)
Alkaline Phosphatase: 34 U/L — ABNORMAL LOW (ref 39–117)
BUN: 15 mg/dL (ref 6–23)
CO2: 26 mEq/L (ref 19–32)
Calcium: 8.8 mg/dL (ref 8.4–10.5)
Chloride: 99 mEq/L (ref 96–112)
Creatinine, Ser: 0.68 mg/dL (ref 0.40–1.20)
GFR: 81.4 mL/min (ref >60.00)
Glucose, Bld: 92 mg/dL (ref 70–99)
Potassium: 4.9 mEq/L (ref 3.5–5.1)
Sodium: 131 mEq/L — ABNORMAL LOW (ref 135–145)
Total Bilirubin: 0.4 mg/dL (ref 0.2–1.2)
Total Protein: 6.2 g/dL (ref 6.0–8.3)

## 2021-02-19 LAB — CBC WITH DIFFERENTIAL/PLATELET
Basophils Absolute: 0.1 10*3/uL (ref 0.0–0.1)
Basophils Relative: 0.7 % (ref 0.0–3.0)
Eosinophils Absolute: 0.1 10*3/uL (ref 0.0–0.7)
Eosinophils Relative: 1.1 % (ref 0.0–5.0)
HCT: 36.6 % (ref 36.0–46.0)
Hemoglobin: 11.8 g/dL — ABNORMAL LOW (ref 12.0–15.0)
Lymphocytes Relative: 15.2 % (ref 12.0–46.0)
Lymphs Abs: 1.4 10*3/uL (ref 0.7–4.0)
MCHC: 32.2 g/dL (ref 30.0–36.0)
MCV: 92.6 fl (ref 78.0–100.0)
Monocytes Absolute: 1.1 10*3/uL — ABNORMAL HIGH (ref 0.1–1.0)
Monocytes Relative: 12.1 % — ABNORMAL HIGH (ref 3.0–12.0)
Neutro Abs: 6.4 10*3/uL (ref 1.4–7.7)
Neutrophils Relative %: 70.9 % (ref 43.0–77.0)
Platelets: 285 10*3/uL (ref 150.0–400.0)
RBC: 3.95 Mil/uL (ref 3.87–5.11)
RDW: 14.2 % (ref 11.5–15.5)
WBC: 9.1 10*3/uL (ref 4.0–10.5)

## 2021-02-19 LAB — URIC ACID: Uric Acid, Serum: 3.8 mg/dL (ref 2.4–7.0)

## 2021-02-19 IMAGING — DX DG FOOT COMPLETE 3+V*L*
3 series · 3 of 3 positions shown · non-contrast
Comparison: None.

CLINICAL DATA: Left great toe pain in redness

EXAM:
LEFT FOOT - COMPLETE 3+ VIEW

[foot ap]
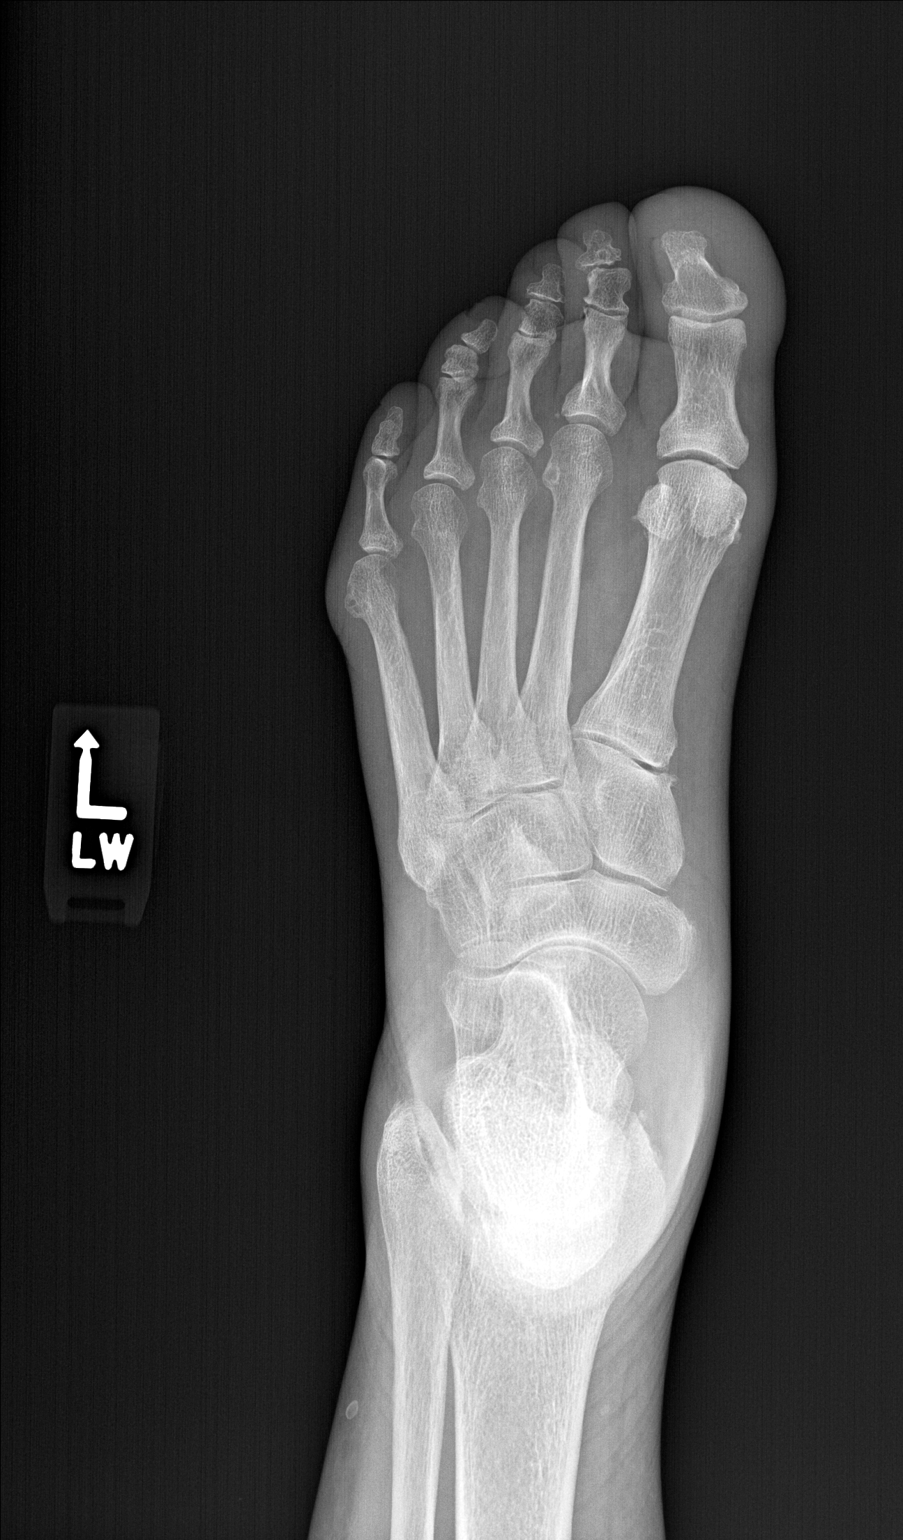

[foot obl (oblique)]
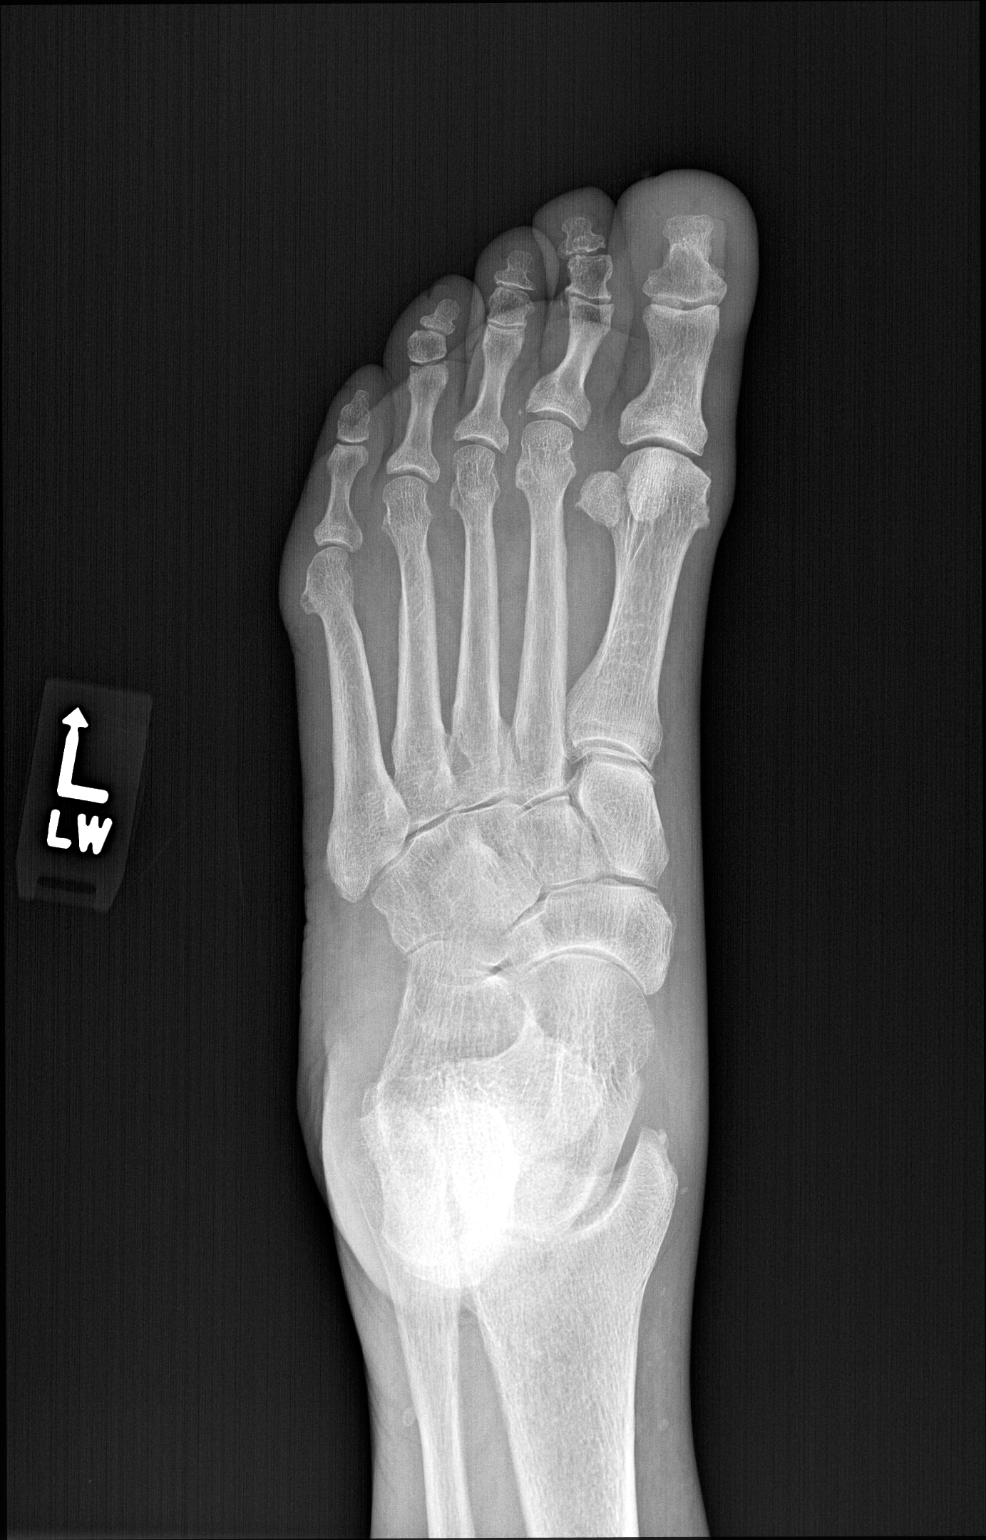

[foot lat]
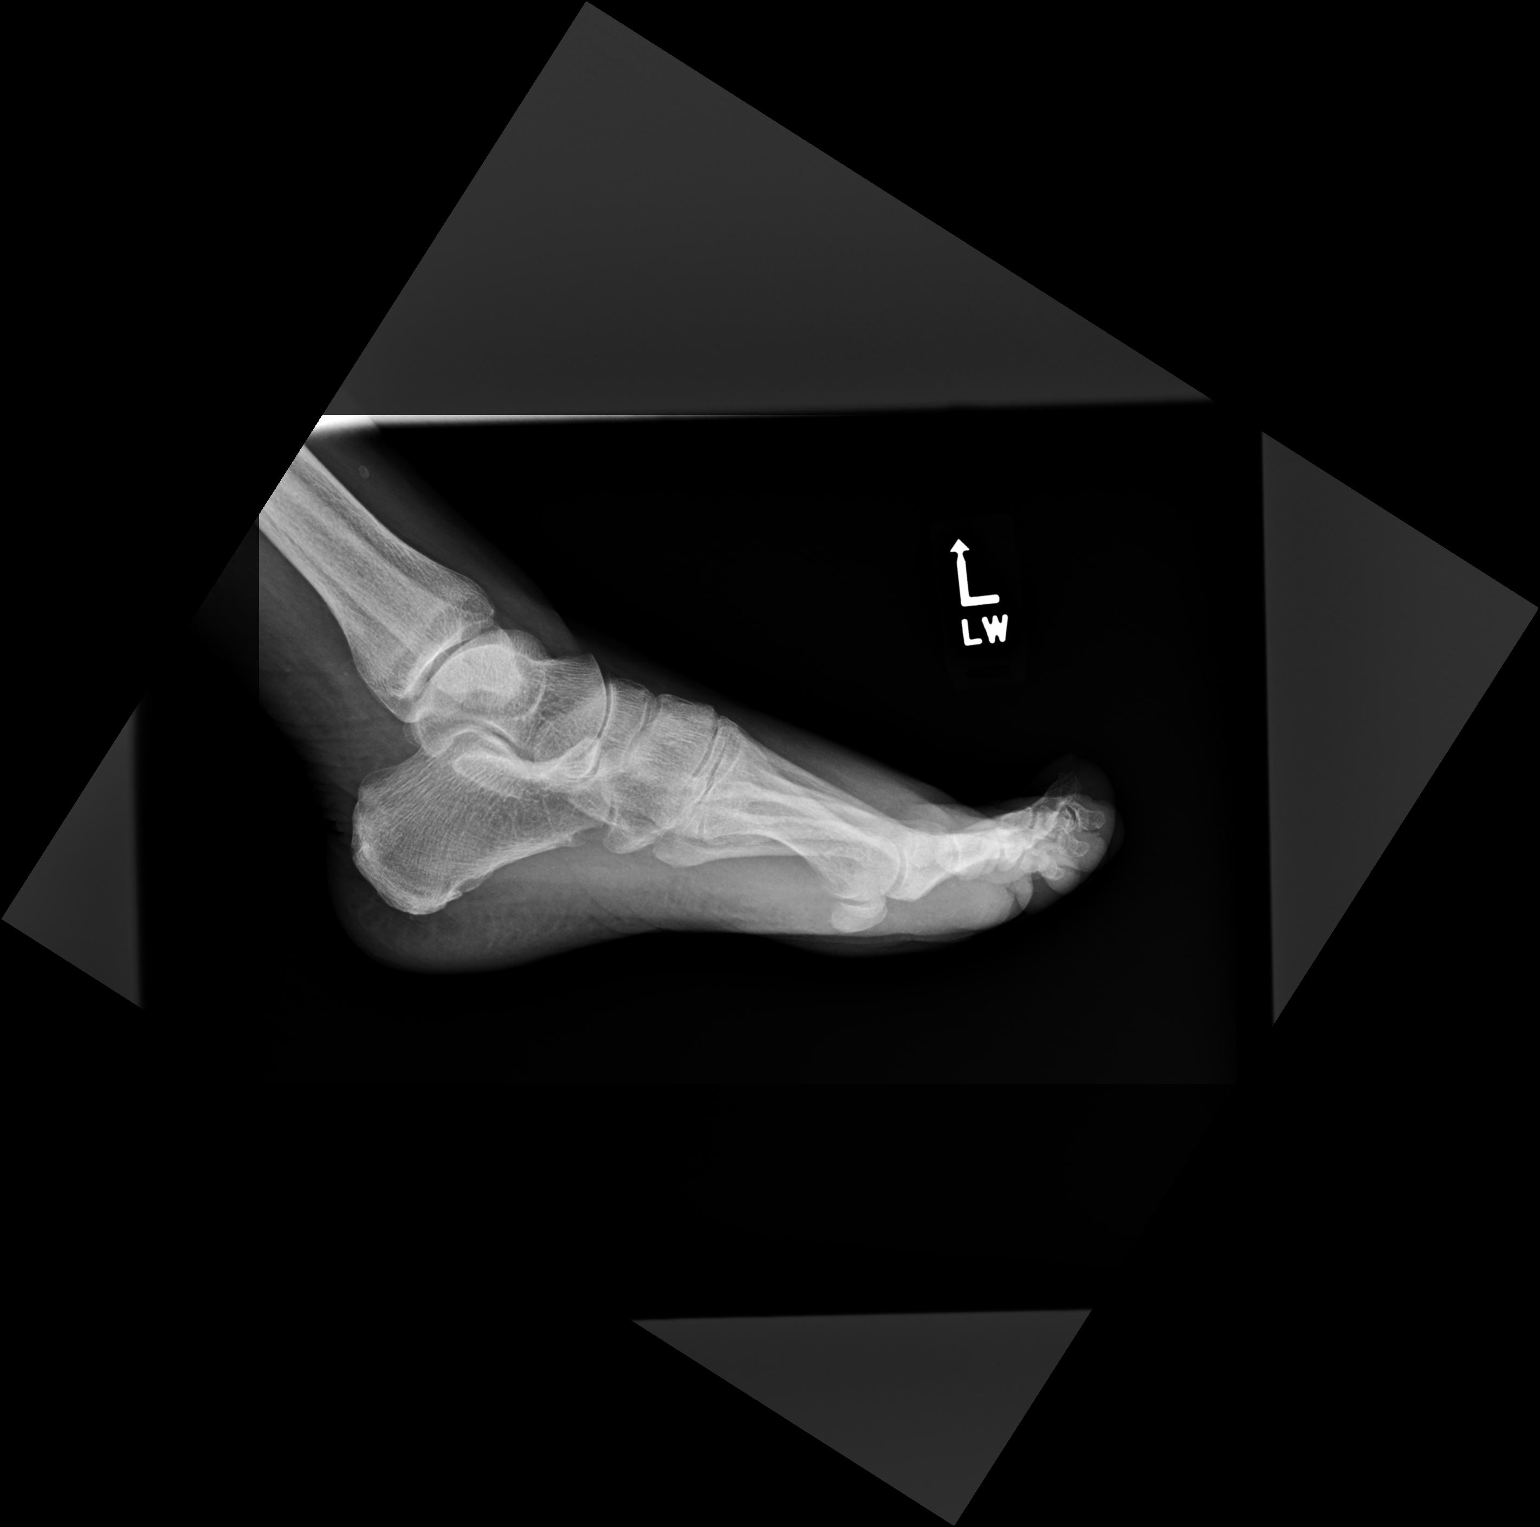

[3 of 3 positions shown; findings below may reference images not displayed]

FINDINGS: No acute fracture. No dislocation. No evidence of focal bone erosion
or periosteal elevation. Mild arthropathy throughout the left foot.
Soft tissue swelling of the great toe. No soft tissue gas.
IMPRESSION: Soft tissue swelling of the great toe. No radiographic evidence of
osteomyelitis.

## 2021-02-19 MED ORDER — CEPHALEXIN 500 MG PO CAPS
500.0000 mg | ORAL_CAPSULE | Freq: Three times a day (TID) | ORAL | 0 refills | Status: AC
Start: 2021-02-19 — End: 2021-02-26

## 2021-02-19 NOTE — Progress Notes (Signed)
Established Patient Office Visit  Subjective:  Patient ID: Lacey Brown, female    DOB: March 30, 1940  Age: 81 y.o. MRN: [id number]  CC:  Chief Complaint  Patient presents with   Foot Swelling    HPI Lacey Brown is here today with concerns.   Left side of lower extremity she noticed some left ankle swelling with some tenderness with walking a few weeks ago. Noticed left great toe was very tender and red, but its starting to get slightly better although not completely. She does state she was trying to 'improve her callus' on her left base of great toe that has been there for 'years' and she used tweezers to pick at it, that may have started the infection. does have left knee pain at times (recent dx testing did show ganglion cyst back of knee, referred to Dr. Ninfa Linden (orthopedist), however Dr. Ninfa Linden stated unlikely cause of left knee pain, gave her knee injection 11/29/20 which she stated helped slightly.   She is currently going to physical therapy for her lower back and bil hips as well, and is in outpt physical therapy at [facility].  She denies any chest pain, sob and or DOE.   BP Readings from Last 3 Encounters:  02/19/21 (!) 142/78  01/08/21 (!) 186/72  11/20/20 (!) 170/70   Pulse Readings from Last 3 Encounters:  02/19/21 66  01/08/21 76  11/20/20 89   ;  Past Medical History:  Diagnosis Date   Arthritis    back, neck; right shoulder;    Asthma    using advair prn   Chicken pox    Hyperlipidemia    Hypertension    controlled with medication;     Past Surgical History:  Procedure Laterality Date   BREAST EXCISIONAL BIOPSY Left    TONSILLECTOMY AND ADENOIDECTOMY      Family History  Problem Relation Age of Onset   Heart disease Mother    Heart disease Father    Arthritis Maternal Grandmother    Diabetes Neg Hx    Cancer Neg Hx    Stroke Neg Hx     Social History   Socioeconomic History   Marital status: Widowed     Spouse name: Not on file   Number of children: Not on file   Years of education: Not on file   Highest education level: Not on file  Occupational History   Not on file  Tobacco Use   Smoking status: Former    Types: Cigarettes    Quit date: 04/17/2009    Years since quitting: 11.8   Smokeless tobacco: Never  Vaping Use   Vaping Use: Never used  Substance and Sexual Activity   Alcohol use: Yes    Alcohol/week: 0.0 standard drinks    Comment: occasional   Drug use: No   Sexual activity: Never  Other Topics Concern   Not on file  Social History Narrative   Not on file   Social Determinants of Health   Financial Resource Strain: Low Risk    Difficulty of Paying Living Expenses: Not hard at all  Food Insecurity: No Food Insecurity   Worried About Charity fundraiser in the Last Year: Never true   [place] in the Last Year: Never true  Transportation Needs: No Transportation Needs   Lack of Transportation (Medical): No   Lack of Transportation (Non-Medical): No  Physical Activity: Inactive   Days of Exercise per Week: 0 days  Minutes of Exercise per Session: 0 min  Stress: No Stress Concern Present   Feeling of Stress : Not at all  Social Connections: Moderately Integrated   Frequency of Communication with Friends and Family: More than three times a week   Frequency of Social Gatherings with Friends and Family: More than three times a week   Attends Religious Services: More than 4 times per year   Active Member of [employer] or Organizations: Yes   Attends Archivist Meetings: 1 to 4 times per year   Marital Status: Widowed  Human resources officer Violence: Not At Risk   Fear of Current or Ex-Partner: No   Emotionally Abused: No   Physically Abused: No   Sexually Abused: No    Outpatient Medications Prior to Visit  Medication Sig Dispense Refill   acetaminophen (TYLENOL) 325 MG tablet Take 650 mg by mouth every 6 (six) hours as needed.     albuterol (VENTOLIN  HFA) 108 (90 Base) MCG/ACT inhaler Inhale 2 puffs into the lungs every 6 (six) hours as needed for wheezing or shortness of breath. 1 each 2   Calcium Carbonate-Vit D-Min (CALCIUM 1200 PO) Take 1 tablet by mouth daily.     Cholecalciferol (VITAMIN D3) 2000 UNITS TABS Take 1 tablet by mouth daily.     Fluticasone-Salmeterol (ADVAIR DISKUS) 250-50 MCG/DOSE AEPB TAKE 1 PUFF BY MOUTH TWICE A DAY **RINSE MOUTH AFTER USE** 180 each 2   ibuprofen (ADVIL,MOTRIN) 400 MG tablet Take 400 mg by mouth daily as needed.      meloxicam (MOBIC) 15 MG tablet Take 1 tablet (15 mg total) by mouth daily as needed for pain. (Patient not taking: Reported on 02/19/2021) 30 tablet 3   metoprolol succinate (TOPROL-XL) 25 MG 24 hr tablet TAKE 1 TABLET BY MOUTH EVERY DAY 90 tablet 0   Omega-3 Fatty Acids (FISH OIL) 1000 MG CAPS Take 1 capsule by mouth daily.     omeprazole (PRILOSEC OTC) 20 MG tablet Take 20 mg by mouth as needed.      prednisoLONE acetate (PRED FORTE) 1 % ophthalmic suspension      telmisartan (MICARDIS) 80 MG tablet Take 1 tablet (80 mg total) by mouth daily. 90 tablet 1   cyclobenzaprine (FLEXERIL) 5 MG tablet 1 tab po 30 minutes before MRI 1 tablet 0   oxyCODONE (ROXICODONE) 5 MG immediate release tablet Take 1 tablet (5 mg total) by mouth every 6 (six) hours as needed for severe pain. 40 tablet 0   predniSONE (DELTASONE) 20 MG tablet 2 tabs po for 7 days, then 1 tab po for 7 days 21 tablet 0   No facility-administered medications prior to visit.    Allergies  Allergen Reactions   Ace Inhibitors Cough    Other reaction(s): Cough   Codeine Nausea Only   Kenalog [Triamcinolone Acetonide] Other (See Comments)    11/2020 post-injection flare after knee injection.  Kenalog preservatives most likely. She should have no issue with oral steroids, or other injectables.  (Ie. Depo-Medrol, Solu-Medrol, Decadron, Celestone, etc.)    ROS Review of Systems  Constitutional:  Negative for chills and fever.   Respiratory:  Negative for cough, shortness of breath and wheezing.   Cardiovascular:  Positive for leg swelling (LLE edema mild). Negative for chest pain and palpitations.  Musculoskeletal:  Positive for arthralgias (left knee pain, chronic), back pain (low back pain chronic) and joint swelling (left great toe with erythema, tender to touch). Negative for gait problem.  Objective:    Physical Exam Constitutional:      General: She is not in acute distress.    Appearance: Normal appearance. She is normal weight. She is not ill-appearing, toxic-appearing or diaphoretic.  HENT:     Head: Normocephalic.  Cardiovascular:     Rate and Rhythm: Normal rate and regular rhythm.     Pulses: Normal pulses.  Pulmonary:     Effort: Pulmonary effort is normal.     Breath sounds: Normal breath sounds.  Musculoskeletal:     Right lower leg: No edema.     Left lower leg: 1+ Edema present.     Right ankle: Normal.     Left ankle: Swelling present. No ecchymosis. Deformity: mild nonpitting 1+.No tenderness. Normal range of motion. Normal pulse.     Right foot: Normal range of motion (nonpainful rom). Bunion (moderate) present. Normal pulse.     Left foot: Normal range of motion (nontender rom). Bunion (mild) present.     Comments: Left great toe with warmth to touch, redness, and tenderness to touch on the medial lateral side of great toe. ROM is nontender, full ROM noted. Callus at base of left great toe. Negative homans sign left lower ext  Skin:    General: Skin is warm.  Neurological:     General: No focal deficit present.     Mental Status: She is alert.  Psychiatric:        Mood and Affect: Mood normal.        Behavior: Behavior normal.        Thought Content: Thought content normal.        Judgment: Judgment normal.        BP (!) 142/78    Pulse 66    Temp (!) 97.5 F (36.4 C) (Temporal)    Ht 4\' 11"  (1.499 m)    Wt 167 lb (75.8 kg)    SpO2 99%    BMI 33.73 kg/m  Wt Readings  from Last 3 Encounters:  02/19/21 167 lb (75.8 kg)  12/05/20 168 lb (76.2 kg)  11/12/20 168 lb (76.2 kg)     There are no preventive care reminders to display for this patient.  There are no preventive care reminders to display for this patient.  No results found for: TSH Lab Results  Component Value Date   WBC 9.1 02/19/2021   HGB 11.8 (L) 02/19/2021   HCT 36.6 02/19/2021   MCV 92.6 02/19/2021   PLT 285.0 02/19/2021   Lab Results  Component Value Date   NA 131 (L) 02/19/2021   K 4.9 02/19/2021   CO2 26 02/19/2021   GLUCOSE 92 02/19/2021   BUN 15 02/19/2021   CREATININE 0.68 02/19/2021   BILITOT 0.4 02/19/2021   ALKPHOS 34 (L) 02/19/2021   AST 13 02/19/2021   ALT 12 02/19/2021   PROT 6.2 02/19/2021   ALBUMIN 4.0 02/19/2021   CALCIUM 8.8 02/19/2021   GFR 81.40 02/19/2021   No results found for: HGBA1C    Assessment & Plan:   Problem List Items Addressed This Visit       Other   Edema of left lower extremity    Left ankle edema not overly concerning considering patient has significant knee pain and decreased mobility in the leg.  Negative Homans' sign.  Nonpitting edema 1+.  Pulses are normal.  Advised patient to elevate extremities especially while sitting down make sure they are elevated above the heart.  Okay to wear compression stockings  as necessary as well as this may also improve the swelling.  Salt intake to be kept at a minimum.      Relevant Orders   Comprehensive metabolic panel (Completed)   Pain of left great toe - Primary    X-ray of left toe ordered today as well as uric acid and CBC.  I want to rule out infectious gout and or if there is any osteomyelitis.  Start patient on cephalexin prescription sent to pharmacy take as directed.  Monitor daily if there is worsening redness warmth and/or tenderness please let me know immediately.  If fever or chills also please let me know.  Follow-up within the next week and we will continue to watch for healing  and improvement      Relevant Medications   cephALEXin (KEFLEX) 500 MG capsule   Other Relevant Orders   DG Foot Complete Left (Completed)   Uric acid (Completed)   CBC w/Diff (Completed)   Cellulitis of great toe of left foot    Suspected cellulitis of left great toe however will also rule out gout with uric acid levels pending.  Started on cephalexin take as directed.  Monitor daily for signs and symptoms of worsening infection follow-up within the next week.      Relevant Medications   cephALEXin (KEFLEX) 500 MG capsule    Meds ordered this encounter  Medications   cephALEXin (KEFLEX) 500 MG capsule    Sig: Take 1 capsule (500 mg total) by mouth 3 (three) times daily for 7 days.    Dispense:  21 capsule    Refill:  0    Order Specific Question:   Supervising Provider    Answer:   BEDSOLE, AMY E [[id number]]    Follow-up: Return in about 1 week (around 02/26/2021) for follow up on toe .    Eugenia Pancoast, FNP

## 2021-02-19 NOTE — Patient Instructions (Addendum)
An antibiotic was prescribed to your preferred pharmacy today, please pick up and take as directed. Please watch toe for worsening signs/symptoms of infection and let me know immediately.   Complete xray(s) prior to leaving today. I will notify you of your results once received. Stop by the lab prior to leaving today. I will notify you of your results once received.   I will reach out to you if we need to make any changes to the plan of care.   It was a pleasure seeing you today! Please do not hesitate to reach out with any questions and or concerns.  Regards,   Eugenia Pancoast FNP-C

## 2021-02-20 ENCOUNTER — Telehealth: Payer: Self-pay | Admitting: Internal Medicine

## 2021-02-20 ENCOUNTER — Other Ambulatory Visit: Payer: Self-pay | Admitting: Family

## 2021-02-20 DIAGNOSIS — D649 Anemia, unspecified: Secondary | ICD-10-CM

## 2021-02-20 DIAGNOSIS — L03032 Cellulitis of left toe: Secondary | ICD-10-CM | POA: Insufficient documentation

## 2021-02-20 NOTE — Telephone Encounter (Signed)
Pt calling in regards to lab results

## 2021-02-20 NOTE — Assessment & Plan Note (Signed)
Left ankle edema not overly concerning considering patient has significant knee pain and decreased mobility in the leg.  Negative Homans' sign.  Nonpitting edema 1+.  Pulses are normal.  Advised patient to elevate extremities especially while sitting down make sure they are elevated above the heart.  Okay to wear compression stockings as necessary as well as this may also improve the swelling.  Salt intake to be kept at a minimum.

## 2021-02-20 NOTE — Telephone Encounter (Signed)
Patient called and completed encounter in result notes.

## 2021-02-20 NOTE — Assessment & Plan Note (Signed)
X-ray of left toe ordered today as well as uric acid and CBC.  I want to rule out infectious gout and or if there is any osteomyelitis.  Start patient on cephalexin prescription sent to pharmacy take as directed.  Monitor daily if there is worsening redness warmth and/or tenderness please let me know immediately.  If fever or chills also please let me know.  Follow-up within the next week and we will continue to watch for healing and improvement

## 2021-02-20 NOTE — Assessment & Plan Note (Signed)
Suspected cellulitis of left great toe however will also rule out gout with uric acid levels pending.  Started on cephalexin take as directed.  Monitor daily for signs and symptoms of worsening infection follow-up within the next week.

## 2021-02-20 NOTE — Progress Notes (Signed)
Left foot x-ray did have some swelling of the toe however no osteomyelitis infection of the toe.  This is likely an infection please continue with antibiotics as directed this is not gout as uric acid was not elevated.  Please let me know if there is any worsening signs or symptoms of infection and no no improvement as I will need to see you back.

## 2021-02-21 NOTE — Progress Notes (Signed)
I wouldn't doubt if it started when you took the tweezers to your callus as you may have allowed bacterial inside your skin. However, honestly this could have come from anywhere. Just a small piece of skin that was left with entry availably would allow bacteria to creep in.

## 2021-02-22 ENCOUNTER — Other Ambulatory Visit: Payer: Self-pay

## 2021-02-22 ENCOUNTER — Other Ambulatory Visit (INDEPENDENT_AMBULATORY_CARE_PROVIDER_SITE_OTHER): Payer: Medicare HMO

## 2021-02-22 DIAGNOSIS — D649 Anemia, unspecified: Secondary | ICD-10-CM

## 2021-02-22 LAB — IRON, TOTAL/TOTAL IRON BINDING CAP
%SAT: 14 % (calc) — ABNORMAL LOW (ref 16–45)
Iron: 48 ug/dL (ref 45–160)
TIBC: 338 mcg/dL (calc) (ref 250–450)

## 2021-02-23 LAB — FERRITIN: Ferritin: 24.7 ng/mL (ref 10.0–291.0)

## 2021-02-27 DIAGNOSIS — M5416 Radiculopathy, lumbar region: Secondary | ICD-10-CM | POA: Diagnosis not present

## 2021-03-01 ENCOUNTER — Telehealth: Payer: Self-pay | Admitting: Internal Medicine

## 2021-03-01 ENCOUNTER — Other Ambulatory Visit: Payer: Self-pay | Admitting: Family

## 2021-03-01 MED ORDER — METOPROLOL SUCCINATE ER 25 MG PO TB24: 25.0000 mg | ORAL_TABLET | Freq: Every day | ORAL | 0 refills | Status: DC

## 2021-03-01 NOTE — Telephone Encounter (Signed)
Medication has been refilled.

## 2021-03-01 NOTE — Telephone Encounter (Signed)
Patient called stated she needs metoprolol 25 MG Please send to [facility]

## 2021-03-05 DIAGNOSIS — H2511 Age-related nuclear cataract, right eye: Secondary | ICD-10-CM | POA: Diagnosis not present

## 2021-03-06 DIAGNOSIS — H2512 Age-related nuclear cataract, left eye: Secondary | ICD-10-CM | POA: Diagnosis not present

## 2021-03-07 ENCOUNTER — Other Ambulatory Visit: Payer: Self-pay | Admitting: Student

## 2021-03-07 DIAGNOSIS — M5416 Radiculopathy, lumbar region: Secondary | ICD-10-CM

## 2021-03-14 ENCOUNTER — Ambulatory Visit: Payer: Medicare HMO | Admitting: Internal Medicine

## 2021-03-22 ENCOUNTER — Ambulatory Visit
Admission: RE | Admit: 2021-03-22 | Discharge: 2021-03-22 | Disposition: A | Discharge status: Home / Self Care | Payer: Medicare HMO | Source: Ambulatory Visit | Attending: Student | Admitting: Student

## 2021-03-22 ENCOUNTER — Other Ambulatory Visit: Payer: Self-pay

## 2021-03-22 DIAGNOSIS — M545 Low back pain, unspecified: Secondary | ICD-10-CM | POA: Diagnosis not present

## 2021-03-22 DIAGNOSIS — M5416 Radiculopathy, lumbar region: Secondary | ICD-10-CM

## 2021-03-22 DIAGNOSIS — M48061 Spinal stenosis, lumbar region without neurogenic claudication: Secondary | ICD-10-CM | POA: Diagnosis not present

## 2021-03-22 IMAGING — MR MR LUMBAR SPINE W/O CM
4 of 5 series · 25 of 48 positions shown · non-contrast
Comparison: None.

CLINICAL DATA: Low back pain radiating down the left side

EXAM:
MRI LUMBAR SPINE WITHOUT CONTRAST
TECHNIQUE: Multiplanar, multisequence MR imaging of the lumbar spine was
performed. No intravenous contrast was administered.

[Series 2: T2 · sagittal · 4.0mm · 0.53mm/px · 6 of 15 slices shown (1 of 2)]
[im 1/15]
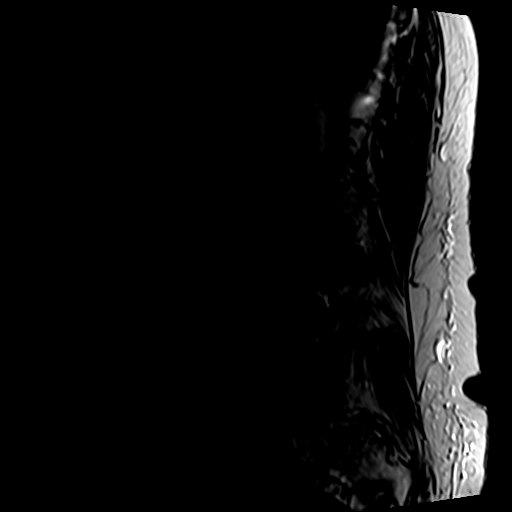
[im 3/15]
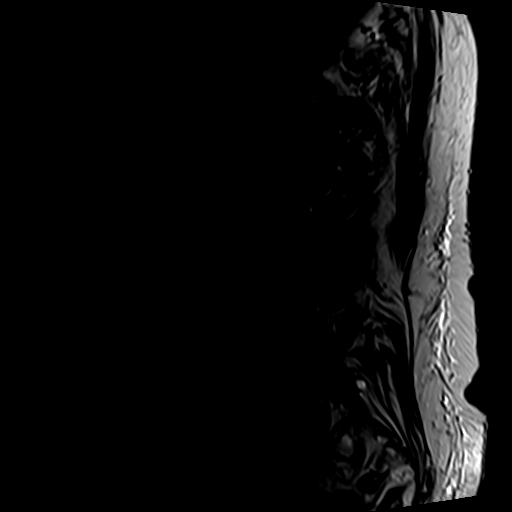
[im 6/15]
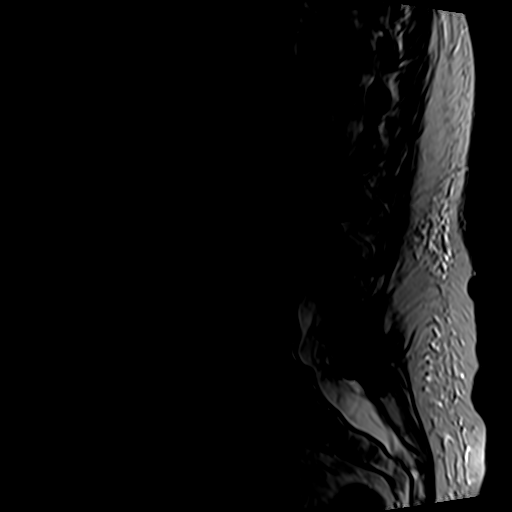
[im 9/15]
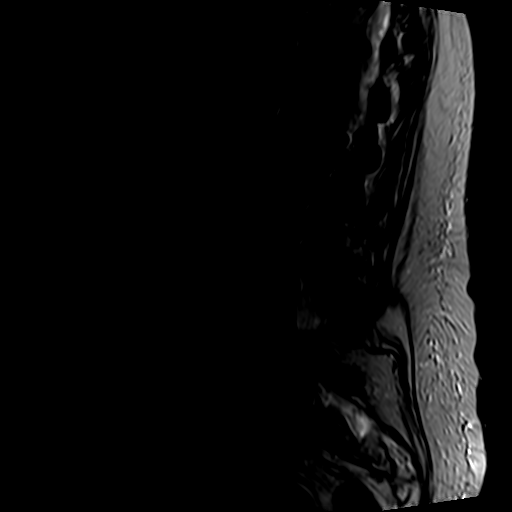
[im 12/15]
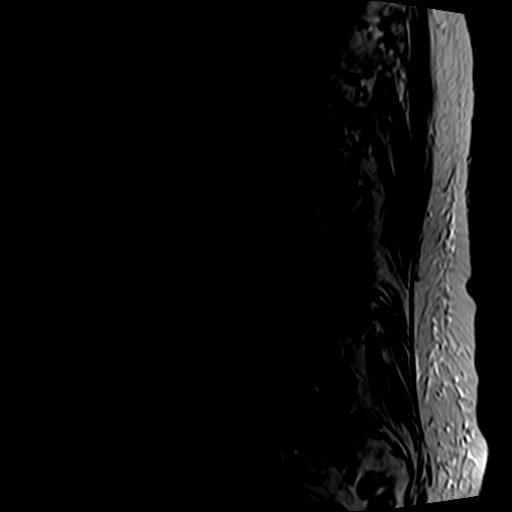
[im 15/15]
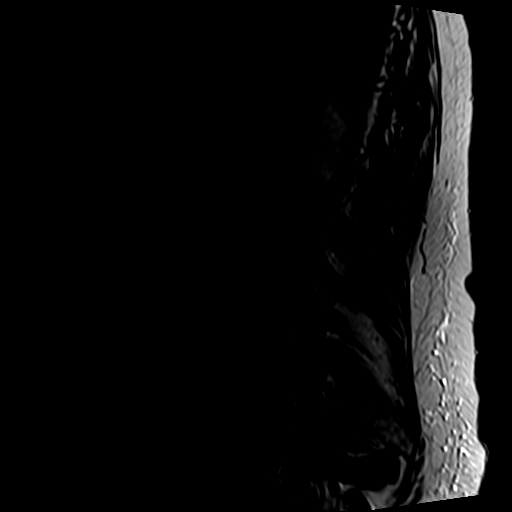

[Series 4: T1 · sagittal · 4.0mm · 0.53mm/px · 6 of 15 slices shown (1 of 2)]
[im 1/15]
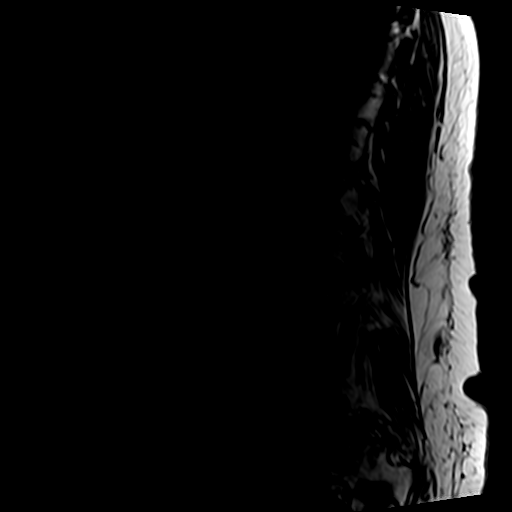
[im 3/15]
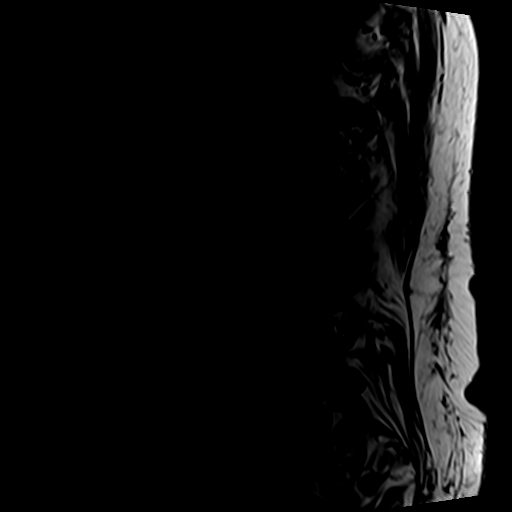
[im 6/15]
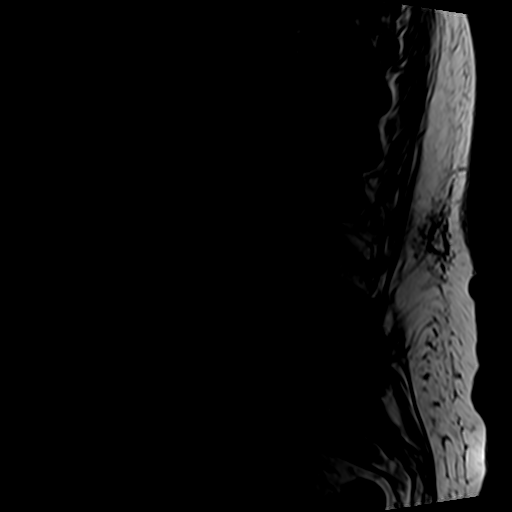
[im 9/15]
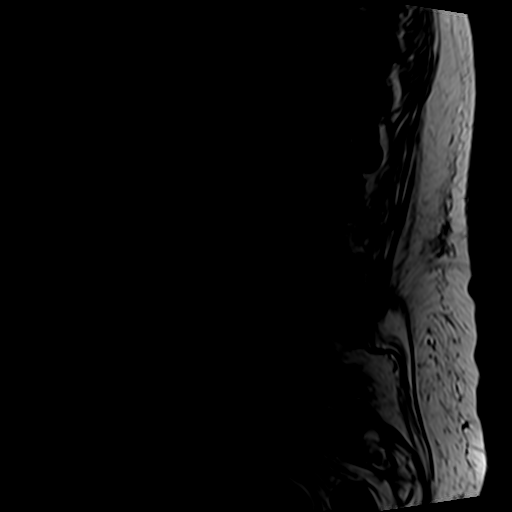
[im 12/15]
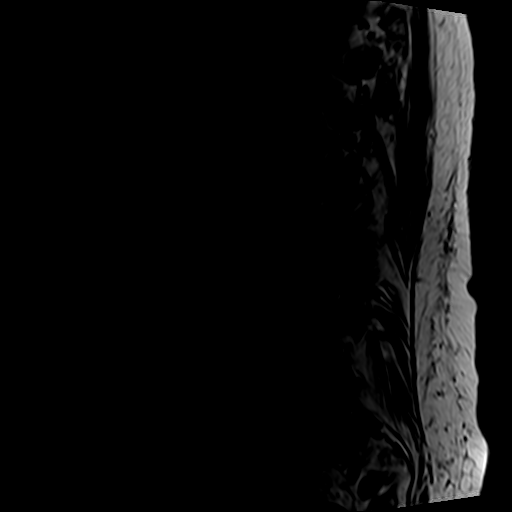
[im 15/15]
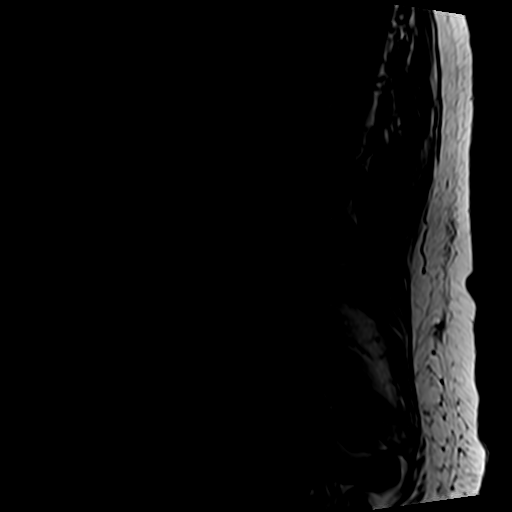

[Series 5: T2 · axial · 4.0mm · 0.70mm/px · z∈[-57,+148]mm · 9 of 37 slices shown (2 of 2)]
[im 1/37]
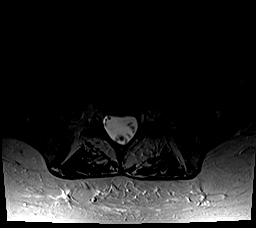
[im 6/37]
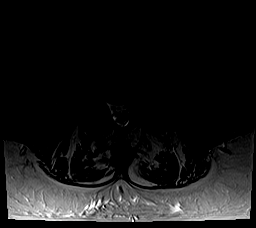
[im 11/37]
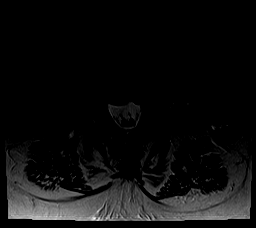
[im 16/37]
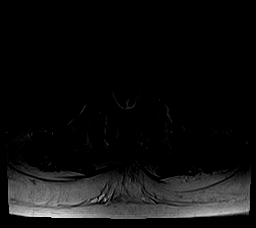
[im 19/37]
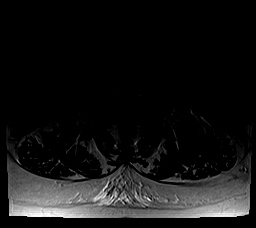
[im 21/37]
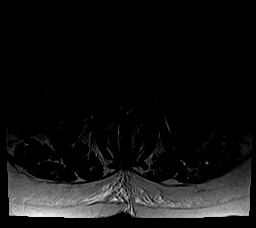
[im 26/37]
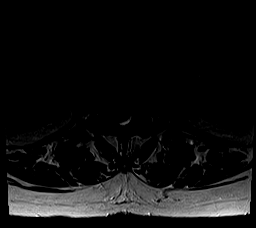
[im 31/37]
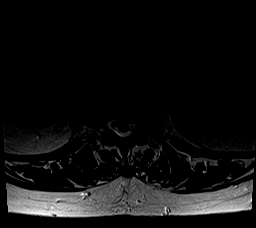
[im 37/37]
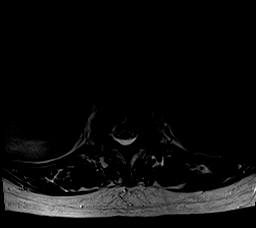

[Series 6: T1 · axial · 4.0mm · 0.35mm/px · z∈[-57,+117]mm · 4 of 37 slices shown (2 of 2)]
[im 1/37]
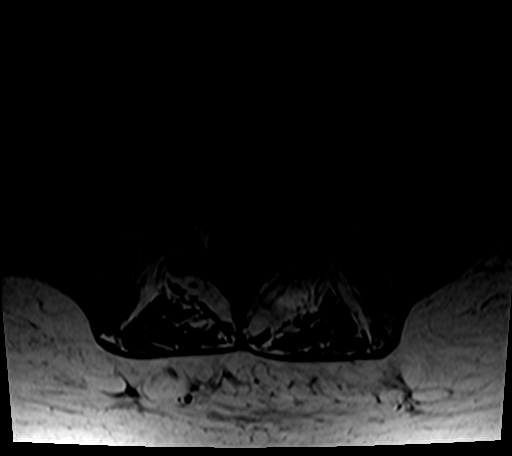
[im 6/37]
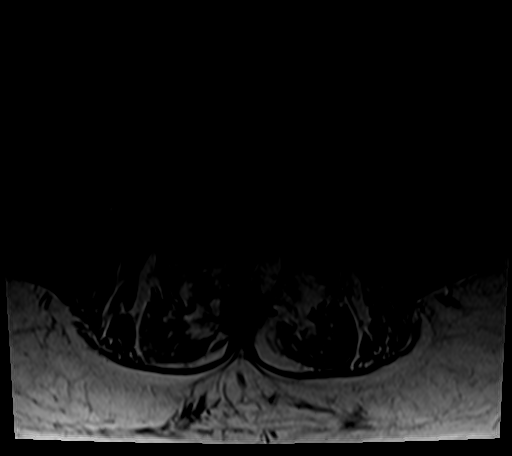
[im 19/37]
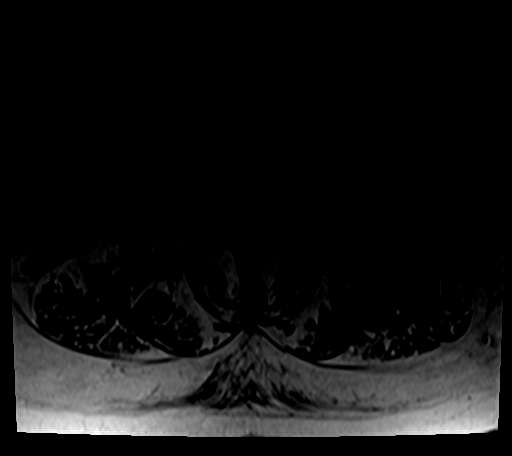
[im 31/37]
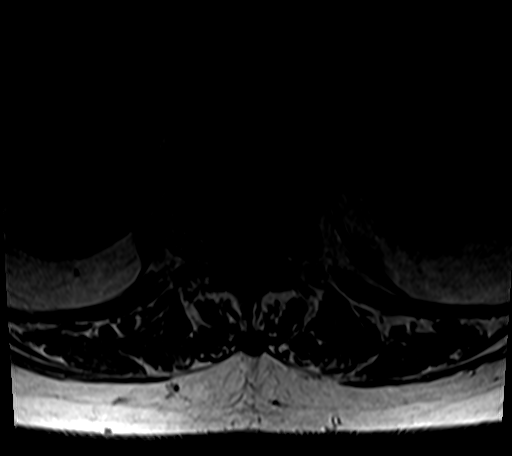

[25 of 48 positions shown; findings below may reference images not displayed]

FINDINGS: Segmentation:  5 lumbar type vertebrae

Alignment: Mild retrolisthesis at L4-5. Mild anterolisthesis at L2-3
and L3-4.

Vertebrae: Remote compression fractures at L1 and L2 with advanced
central height loss. Both are healed. No acute fracture, discitis,
or aggressive bone lesion. Diffuse heterogeneity of marrow with
overall benign appearance

Conus medullaris and cauda equina: Low conus which is difficult to
separate from a thickened filum terminale, conus terminating at
least below a diastematomyelia with septum at the level of L3 to L5.

Paraspinal and other soft tissues: Atrophy of intrinsic back muscles

Disc levels:

T12- L1: Disc narrowing and bulging with mild facet spurring mild
bilateral foraminal narrowing

L1-L2: Disc narrowing and bulging with bilateral facet spurring.
Bilateral foraminal impingement. Mild spinal stenosis

L2-L3: Disc narrowing and bulging. Facet spurring and ligamentum
flavum thickening. Moderate narrowing of the thecal sac.
Noncompressive bilateral foraminal narrowing

L3-L4: Disc bulging and borderline facet spurring

L4-L5: Disc narrowing and bulging with endplate and facet spurring.
Advanced bilateral foraminal impingement.

L5-S1:Disc narrowing and bulging eccentric to the left. Facet
osteoarthritis with particularly bulky left-sided spurring. Severe
left foraminal impingement. Globular material at the left
subarticular recess, likely spur with synovial cysts, with left S1
impingement.
IMPRESSION: 1. Abnormal low conus termination with diastematomyelia seen at
L3-L5.
2. Advanced lumbar spine degeneration as described.
3. Up to moderate spinal stenosis at L2-3.
4. Advanced foraminal impingement on the right at L1-2 and L4-5.
Advanced foraminal impingement on the left at L1-2 and especially
L4-5, L5-S1.
5. L5-S1 advanced left subarticular recess stenosis and S1
impingement due to facet spur and synovial cyst.
6. Remote and healed compression fractures at L1 and L2.

## 2021-03-26 DIAGNOSIS — H2512 Age-related nuclear cataract, left eye: Secondary | ICD-10-CM | POA: Diagnosis not present

## 2021-03-29 DIAGNOSIS — Z6832 Body mass index (BMI) 32.0-32.9, adult: Secondary | ICD-10-CM | POA: Diagnosis not present

## 2021-03-29 DIAGNOSIS — I10 Essential (primary) hypertension: Secondary | ICD-10-CM | POA: Diagnosis not present

## 2021-03-29 DIAGNOSIS — Q062 Diastematomyelia: Secondary | ICD-10-CM | POA: Diagnosis not present

## 2021-03-29 DIAGNOSIS — M5126 Other intervertebral disc displacement, lumbar region: Secondary | ICD-10-CM | POA: Diagnosis not present

## 2021-03-29 DIAGNOSIS — M5416 Radiculopathy, lumbar region: Secondary | ICD-10-CM | POA: Diagnosis not present

## 2021-03-29 DIAGNOSIS — M7130 Other bursal cyst, unspecified site: Secondary | ICD-10-CM | POA: Diagnosis not present

## 2021-03-30 ENCOUNTER — Other Ambulatory Visit: Payer: Self-pay | Admitting: Neurosurgery

## 2021-03-30 DIAGNOSIS — Q062 Diastematomyelia: Secondary | ICD-10-CM

## 2021-04-16 DIAGNOSIS — M5416 Radiculopathy, lumbar region: Secondary | ICD-10-CM | POA: Diagnosis not present

## 2021-04-16 DIAGNOSIS — M7130 Other bursal cyst, unspecified site: Secondary | ICD-10-CM | POA: Diagnosis not present

## 2021-04-18 ENCOUNTER — Ambulatory Visit
Admission: RE | Admit: 2021-04-18 | Discharge: 2021-04-18 | Disposition: A | Discharge status: Home / Self Care | Payer: Medicare HMO | Source: Ambulatory Visit | Attending: Neurosurgery | Admitting: Neurosurgery

## 2021-04-18 ENCOUNTER — Other Ambulatory Visit: Payer: Self-pay

## 2021-04-18 DIAGNOSIS — M48061 Spinal stenosis, lumbar region without neurogenic claudication: Secondary | ICD-10-CM | POA: Diagnosis not present

## 2021-04-18 DIAGNOSIS — M2578 Osteophyte, vertebrae: Secondary | ICD-10-CM | POA: Diagnosis not present

## 2021-04-18 DIAGNOSIS — Q062 Diastematomyelia: Secondary | ICD-10-CM

## 2021-04-18 DIAGNOSIS — M4126 Other idiopathic scoliosis, lumbar region: Secondary | ICD-10-CM | POA: Diagnosis not present

## 2021-04-18 IMAGING — CT CT L SPINE W/O CM
1 of 7 series · 6 of 14 positions shown, 8 images · non-contrast
Comparison: Lumbar MRI [DATE].

CLINICAL DATA: 81-year-old female with Diastematomyelia. Unsteady
gait. Spinal injection 1 week ago.



[Series 3: l spine soft · axial · 0.36mm/px · z∈[-273,-139]mm · 6 of 95 slices shown, 8 images]
[im 14/95  soft-tissue]
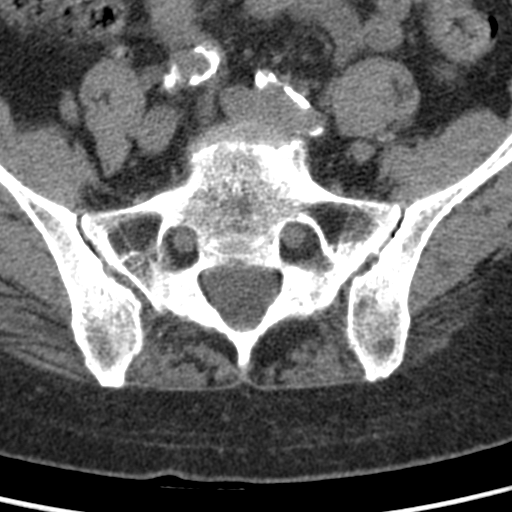
[im 14/95  bone]
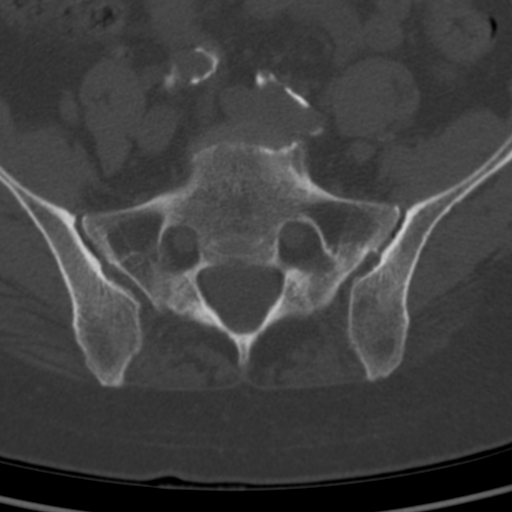
[im 27/95  bone]
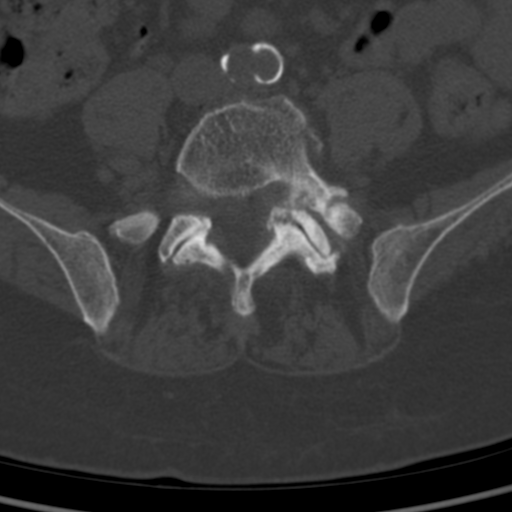
[im 41/95  bone]
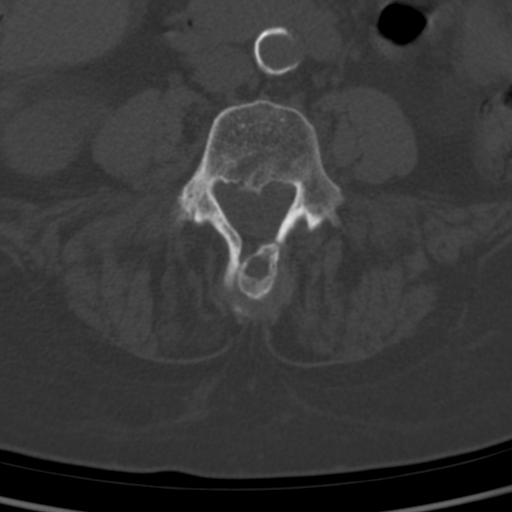
[im 54/95  bone]
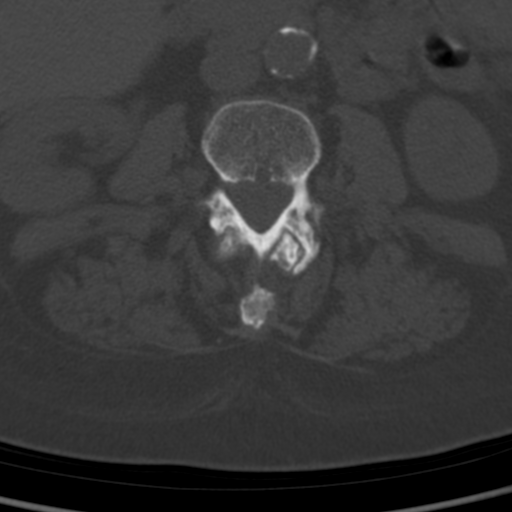
[im 68/95  soft-tissue]
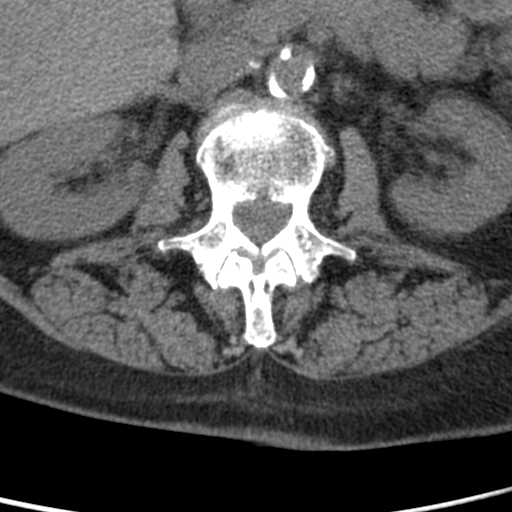
[im 68/95  bone]
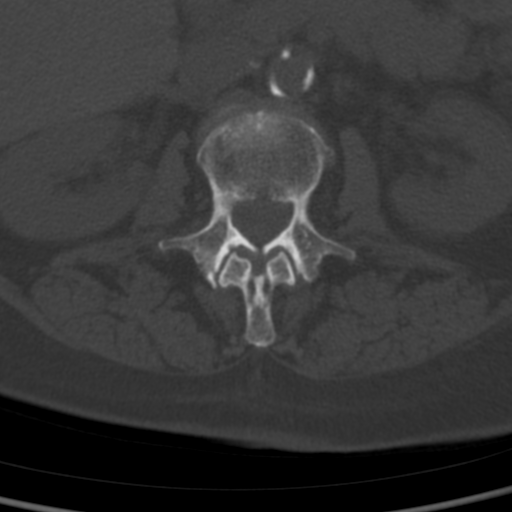
[im 81/95  bone]
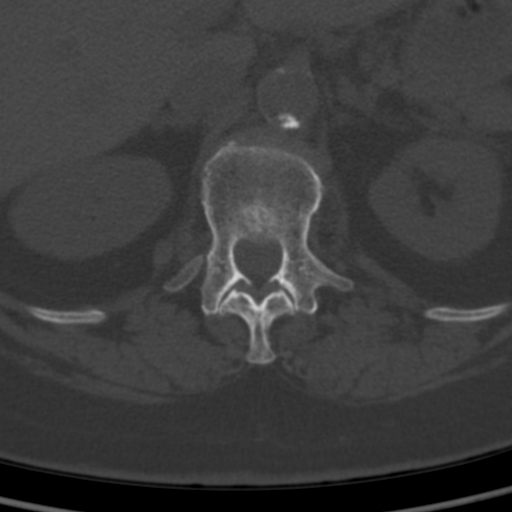

[6 of 14 positions shown; findings below may reference images not displayed]

FINDINGS: Segmentation: Normal lumbar segmentation assuming the lowest full
size ribs at T12 and ununited right L1 transverse process
ossification center.

Alignment: Stable lumbar lordosis with grade 1 anterolisthesis at
L3-L4 and similar mild retrolisthesis at L4-L5. Subtle
anterolisthesis at both L2-L3 and L5-S1. Mild underlying levoconvex
lumbar scoliosis.

Vertebrae: No acute osseous abnormality identified. Moderate to
severe chronic L1 and L2 compression fractures. Dysplastic L4
vertebra, further detailed below. Visible sacrum and SI joints
appear intact.

Paraspinal and other soft tissues: Extensive Calcified aortic
atherosclerosis. Probable fat containing right lung base Bochdalek's
hernia, normal variant. Normal caliber abdominal aorta. Negative
visible other noncontrast abdominal viscera. Lumbar paraspinal soft
tissues within normal limits.

Disc levels: Lumbar spine degeneration appears stable from the MRI
last month and most notable for:

L2-L3: Bulky circumferential disc with mild facet hypertrophy. Mild
spinal stenosis and mild to moderate bilateral foraminal stenosis.

L3-L4: Midline bony bar of the L4 body just below the disc space
level here (series 6, image 51) in association with
diastematomyelia. Circumferential disc bulging and endplate
spurring. Dysplastic L4 posterior elements but only mild facet
hypertrophy.

L4-L5: Disc space loss with vacuum disc and retrolisthesis.
Noncalcified but hyperdense space-occupying mass in the left lateral
recess (series 3, image 66) corresponding to the MRI finding there.
Left eccentric circumferential disc osteophyte complex superimposed.
Moderate to severe facet hypertrophy at this level superimposed on
dysplastic L4 posterior elements. Severe left lateral recess (left
L5 nerve level) and left L4 foraminal stenosis. No spinal stenosis.

L5-S1: Mild anterolisthesis with vacuum disc. Circumferential disc
osteophyte complex. Moderate to severe facet hypertrophy greater on
the left. Moderate to severe left lateral recess stenosis (left S1
nerve level). Severe left and moderate right L5 foraminal stenosis.
IMPRESSION: 1. No acute osseous abnormality in the lumbar spine.
Chronic L1 and L2 compression fractures.
Dysplastic L4 vertebra with midline bony bar in association with
diastematomyelia, dysplastic posterior elements.

2. Hyperdense but noncalcified space-occupying mass in the left
lateral recess just below the L4-L5 disc space could be a disc
fragment or synovial cyst. Associated severe left lateral recess
stenosis at the left L5 nerve level. And multifactorial severe left
L4 foraminal stenosis there.

3. Advanced disc and posterior element degeneration at L5-S1 greater
on the left with up to severe left lateral recess and left foraminal
stenosis there.

4. Multifactorial mild spinal stenosis at L2-L3.

5. Aortic Atherosclerosis ([UG]-[UG]).

## 2021-04-25 DIAGNOSIS — D2272 Melanocytic nevi of left lower limb, including hip: Secondary | ICD-10-CM | POA: Diagnosis not present

## 2021-04-25 DIAGNOSIS — L821 Other seborrheic keratosis: Secondary | ICD-10-CM | POA: Diagnosis not present

## 2021-04-25 DIAGNOSIS — L538 Other specified erythematous conditions: Secondary | ICD-10-CM | POA: Diagnosis not present

## 2021-04-25 DIAGNOSIS — D225 Melanocytic nevi of trunk: Secondary | ICD-10-CM | POA: Diagnosis not present

## 2021-04-25 DIAGNOSIS — D2271 Melanocytic nevi of right lower limb, including hip: Secondary | ICD-10-CM | POA: Diagnosis not present

## 2021-04-25 DIAGNOSIS — L820 Inflamed seborrheic keratosis: Secondary | ICD-10-CM | POA: Diagnosis not present

## 2021-04-25 DIAGNOSIS — D2261 Melanocytic nevi of right upper limb, including shoulder: Secondary | ICD-10-CM | POA: Diagnosis not present

## 2021-04-25 DIAGNOSIS — D2262 Melanocytic nevi of left upper limb, including shoulder: Secondary | ICD-10-CM | POA: Diagnosis not present

## 2021-05-02 ENCOUNTER — Other Ambulatory Visit: Payer: Self-pay | Admitting: Internal Medicine

## 2021-05-03 ENCOUNTER — Other Ambulatory Visit: Payer: Self-pay

## 2021-05-03 ENCOUNTER — Other Ambulatory Visit: Payer: Self-pay | Admitting: Internal Medicine

## 2021-05-03 MED ORDER — TELMISARTAN 80 MG PO TABS: 80.0000 mg | ORAL_TABLET | Freq: Every day | ORAL | 1 refills | Status: DC

## 2021-05-03 MED ORDER — FLUTICASONE-SALMETEROL 100-50 MCG/ACT IN AEPB: 1.0000 | INHALATION_SPRAY | Freq: Two times a day (BID) | RESPIRATORY_TRACT | 3 refills | Status: DC

## 2021-05-03 NOTE — Telephone Encounter (Signed)
Pt called in requesting for refill on medication (telmisartan (MICARDIS) 80 MG tablet) and (Fluticasone-Salmeterol (ADVAIR DISKUS) 250-50 MCG/DOSE AEPB).... Pt stated that she almost out of medication... Pt requesting callback...  ?

## 2021-05-04 NOTE — Telephone Encounter (Signed)
No longer under provider care ?Requested Prescriptions  ?Pending Prescriptions Disp Refills  ?? ADVAIR DISKUS 250-50 MCG/ACT AEPB [Pharmacy Med Name: ADVAIR 250-50 TDHRCB] 63 each 8  ?  Sig: TAKE 1 PUFF BY MOUTH TWICE A DAY **RINSE MOUTH AFTER USE**  ?  ? There is no refill protocol information for this order  ?  ? ?

## 2021-05-09 ENCOUNTER — Other Ambulatory Visit: Payer: Self-pay | Admitting: Internal Medicine

## 2021-05-09 ENCOUNTER — Other Ambulatory Visit: Payer: Self-pay | Admitting: Family Medicine

## 2021-05-09 DIAGNOSIS — Z1231 Encounter for screening mammogram for malignant neoplasm of breast: Secondary | ICD-10-CM

## 2021-05-10 DIAGNOSIS — Z6832 Body mass index (BMI) 32.0-32.9, adult: Secondary | ICD-10-CM | POA: Diagnosis not present

## 2021-05-10 DIAGNOSIS — M5416 Radiculopathy, lumbar region: Secondary | ICD-10-CM | POA: Diagnosis not present

## 2021-05-10 DIAGNOSIS — M25562 Pain in left knee: Secondary | ICD-10-CM | POA: Diagnosis not present

## 2021-05-10 DIAGNOSIS — I10 Essential (primary) hypertension: Secondary | ICD-10-CM | POA: Diagnosis not present

## 2021-05-14 ENCOUNTER — Encounter: Payer: Self-pay | Admitting: Orthopaedic Surgery

## 2021-05-14 ENCOUNTER — Ambulatory Visit: Payer: Medicare HMO | Admitting: Orthopaedic Surgery

## 2021-05-14 DIAGNOSIS — M25562 Pain in left knee: Secondary | ICD-10-CM

## 2021-05-14 DIAGNOSIS — G8929 Other chronic pain: Secondary | ICD-10-CM | POA: Diagnosis not present

## 2021-05-14 DIAGNOSIS — M7542 Impingement syndrome of left shoulder: Secondary | ICD-10-CM

## 2021-05-14 MED ORDER — LIDOCAINE HCL 1 % IJ SOLN
3.0000 mL | INTRAMUSCULAR | Status: AC | PRN
  Administered 2021-05-14: 3 mL

## 2021-05-14 MED ORDER — METHYLPREDNISOLONE ACETATE 40 MG/ML IJ SUSP
40.0000 mg | INTRAMUSCULAR | Status: AC | PRN
  Administered 2021-05-14: 40 mg via INTRA_ARTICULAR

## 2021-05-14 NOTE — Progress Notes (Signed)
? ?Office Visit Note ?  ?Patient: Lacey Brown           ?Date of Birth: 01-Jul-1940           ?MRN: [id number] ?Visit Date: 05/14/2021 ?             ?Requested by: Crecencio Mc, MD ?[address] ?[address] ?[place],  [place] [address] ?PCP: Crecencio Mc, MD ? ? ?Assessment & Plan: ?Visit Diagnoses:  ?1. Chronic pain of left knee   ?2. Impingement syndrome of left shoulder   ? ? ?Plan: I did provide a steroid injection in her left shoulder subacromial outlet in her left knee without difficulty.  All questions and concerns were answered and addressed.  Follow-up can be as needed. ? ?Follow-Up Instructions: Return if symptoms worsen or fail to improve.  ? ?Orders:  ?Orders Placed This Encounter  ?Procedures  ? Large Joint Inj  ? Large Joint Inj  ? ?No orders of the defined types were placed in this encounter. ? ? ? ? Procedures: ?Large Joint Inj: L knee on 05/14/2021 3:31 PM ?Indications: diagnostic evaluation and pain ?Details: 22 G 1.5 in needle, superolateral approach ? ?Arthrogram: No ? ?Medications: 3 mL lidocaine 1 %; 40 mg methylPREDNISolone acetate 40 MG/ML ?Outcome: tolerated well, no immediate complications ?Procedure, treatment alternatives, risks and benefits explained, specific risks discussed. Consent was given by the patient. Immediately prior to procedure a time out was called to verify the correct patient, procedure, equipment, support staff and site/side marked as required. Patient was prepped and draped in the usual sterile fashion.  ? ? ?Large Joint Inj: L subacromial bursa on 05/14/2021 3:35 PM ?Indications: pain and diagnostic evaluation ?Details: 22 G 1.5 in needle ? ?Arthrogram: No ? ?Medications: 3 mL lidocaine 1 %; 40 mg methylPREDNISolone acetate 40 MG/ML ?Outcome: tolerated well, no immediate complications ?Procedure, treatment alternatives, risks and benefits explained, specific risks discussed. Consent was given by the patient. Immediately prior to procedure a time out was  called to verify the correct patient, procedure, equipment, support staff and site/side marked as required. Patient was prepped and draped in the usual sterile fashion.  ? ? ? ? ?Clinical Data: ?No additional findings. ? ? ?Subjective: ?Chief Complaint  ?Patient presents with  ? Left Knee - Follow-up  ?The patient comes in today requesting steroid injection in the left knee.  She is 81 years old.  We did inject her knee between 5 and 6 months ago and that helped her quite a bit until recently.  She is also having shoulder pain when she reaches overhead and behind her especially when she is washing her hair.  There is been no known injury.  She is not a diabetic.  She wonders if we can inject her shoulder today as well.  She ambulates without any type of assistive device.  She denies any locking and catching with the left knee.  An MRI in the past did show a cystic structure behind the PCL but otherwise intact cartilage and no meniscal tear.  I did have Dr. Marlou Sa see her as well.  He noted patellofemoral arthritic changes.  Otherwise she has been doing so well since the injection that she has been satisfied up until recently when she started having knee pain again.  It is her left shoulder is involving her. ? ?HPI ? ?Review of Systems ?She denies any fever, chills, nausea, vomiting ? ?Objective: ?Vital Signs: There were no vitals taken for this visit. ? ?  Physical Exam ?She is alert and orient x3 and in no acute distress ?Ortho Exam ?Examination of her left shoulder does show signs of impingement but otherwise the rotator cuff itself seems to be intact and the shoulder is well located.  Her left knee has some global tenderness but no effusion and excellent range of motion.  There is no redness or instability. ?Specialty Comments:  ?No specialty comments available. ? ?Imaging: ?No results found. ? ? ?PMFS History: ?Patient Active Problem List  ? Diagnosis Date Noted  ? Cellulitis of great toe of left foot 02/20/2021  ?  Edema of left lower extremity 02/19/2021  ? Pain of left great toe 02/19/2021  ? Aortic atherosclerosis ([place]) 08/02/2020  ? Osteoarthritis of knees, bilateral 08/02/2020  ? Osteoporosis 08/02/2020  ? Arthritis 10/24/2014  ? HTN (hypertension) 10/24/2014  ? Asthma 10/24/2014  ? GERD (gastroesophageal reflux disease) 10/24/2014  ? ?Past Medical History:  ?Diagnosis Date  ? Arthritis   ? back, neck; right shoulder;   ? Asthma   ? using advair prn  ? Chicken pox   ? Hyperlipidemia   ? Hypertension   ? controlled with medication;   ?  ?Family History  ?Problem Relation Age of Onset  ? Heart disease Mother   ? Heart disease Father   ? Arthritis Maternal Grandmother   ? Diabetes Neg Hx   ? Cancer Neg Hx   ? Stroke Neg Hx   ?  ?Past Surgical History:  ?Procedure Laterality Date  ? BREAST EXCISIONAL BIOPSY Left   ? TONSILLECTOMY AND ADENOIDECTOMY    ? ?Social History  ? ?Occupational History  ? Not on file  ?Tobacco Use  ? Smoking status: Former  ?  Types: Cigarettes  ?  Quit date: 04/17/2009  ?  Years since quitting: 12.0  ? Smokeless tobacco: Never  ?Vaping Use  ? Vaping Use: Never used  ?Substance and Sexual Activity  ? Alcohol use: Yes  ?  Alcohol/week: 0.0 standard drinks  ?  Comment: occasional  ? Drug use: No  ? Sexual activity: Never  ? ? ? ? ? ? ?

## 2021-05-16 ENCOUNTER — Ambulatory Visit
Admission: RE | Admit: 2021-05-16 | Discharge: 2021-05-16 | Disposition: A | Discharge status: Home / Self Care | Payer: Medicare HMO | Source: Ambulatory Visit | Attending: Internal Medicine | Admitting: Internal Medicine

## 2021-05-16 DIAGNOSIS — Z1231 Encounter for screening mammogram for malignant neoplasm of breast: Secondary | ICD-10-CM | POA: Diagnosis not present

## 2021-05-16 IMAGING — MG MM DIGITAL SCREENING BILAT W/ TOMO AND CAD
8 series · 8 of 24 positions shown · non-contrast
Comparison: Previous exam(s).

CLINICAL DATA: Screening.

EXAM:
DIGITAL SCREENING BILATERAL MAMMOGRAM WITH TOMOSYNTHESIS AND CAD
TECHNIQUE: Bilateral screening digital craniocaudal and mediolateral oblique
mammograms were obtained. Bilateral screening digital breast
tomosynthesis was performed. The images were evaluated with
computer-aided detection.

[R CC synth-2D]
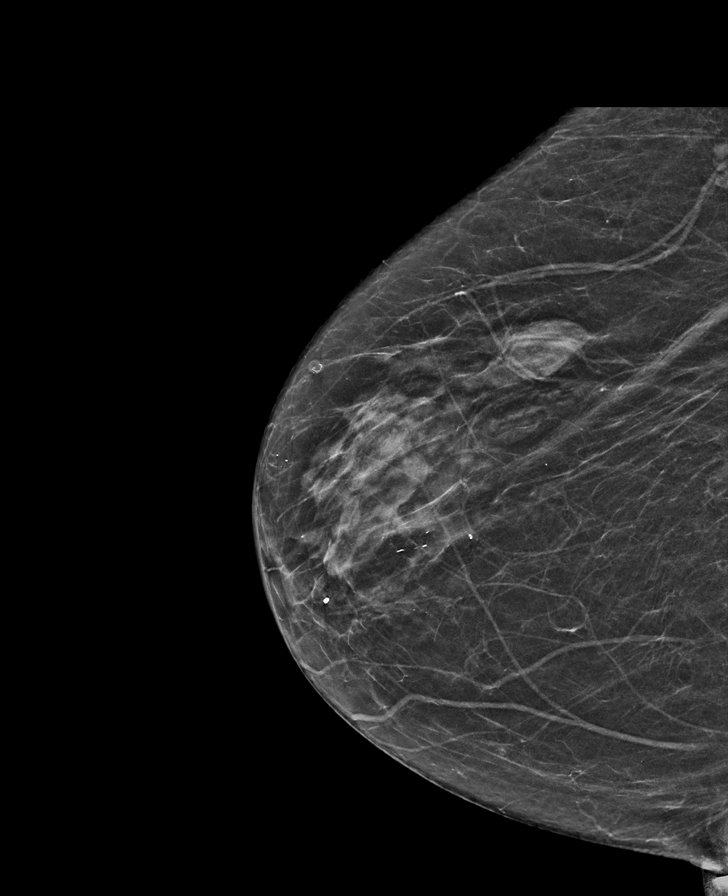

[L MLO synth-2D]
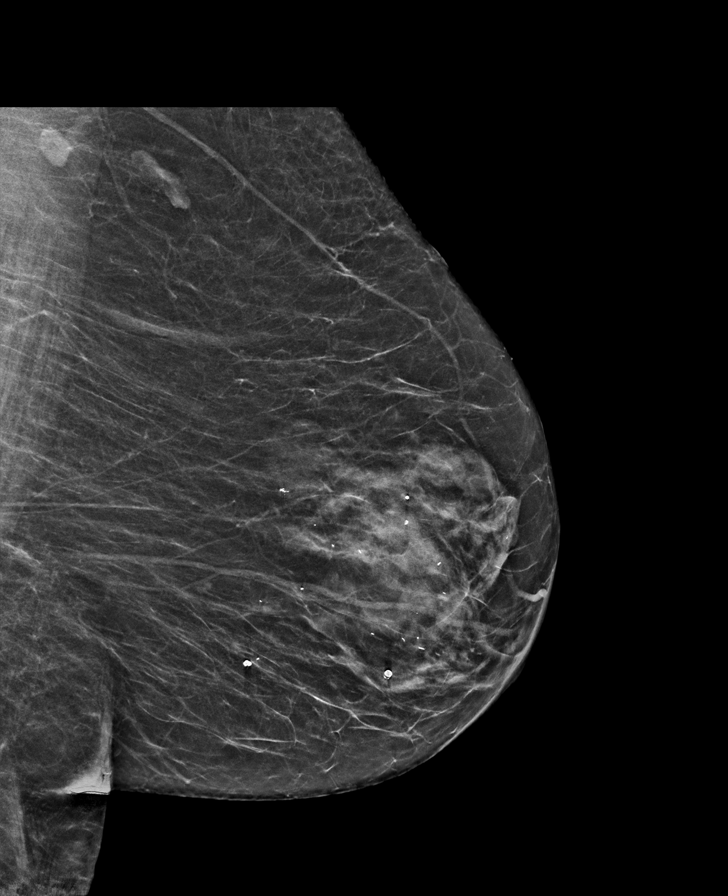

[L CC synth-2D]
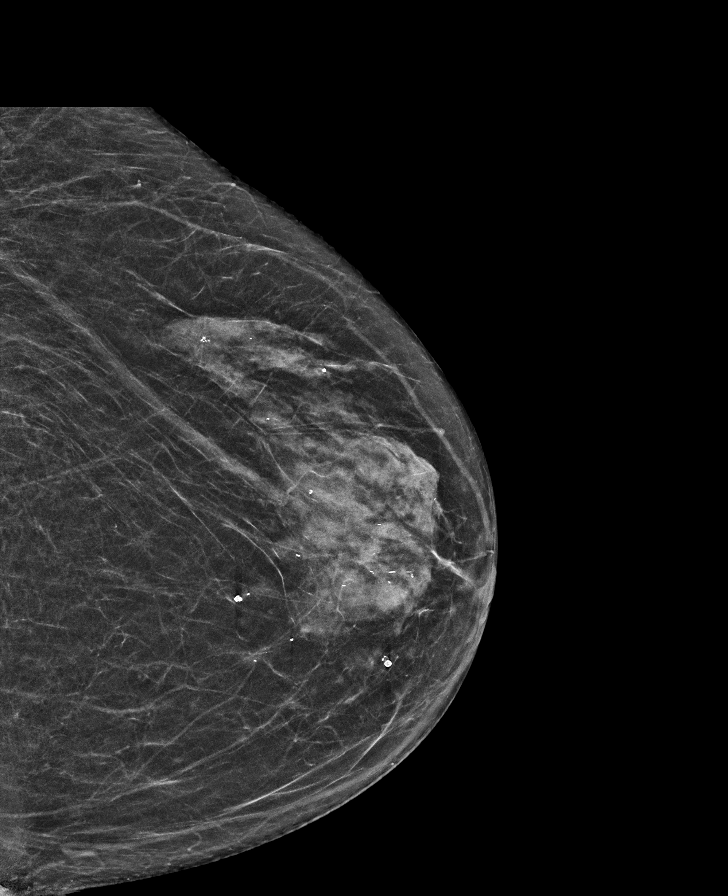

[R MLO synth-2D]
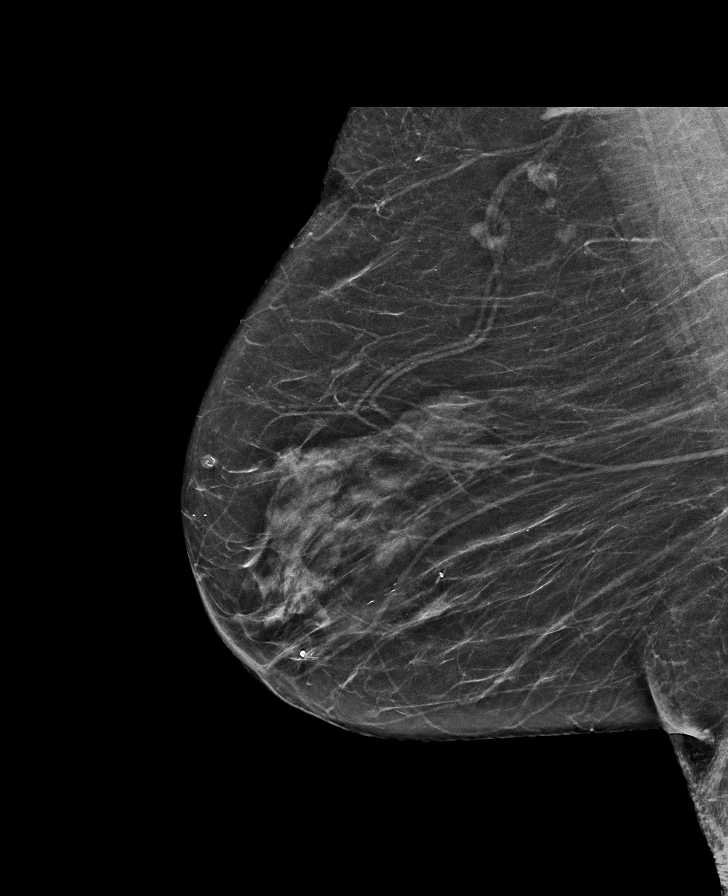

[L CC tomo · tomo slice 27/53.0]
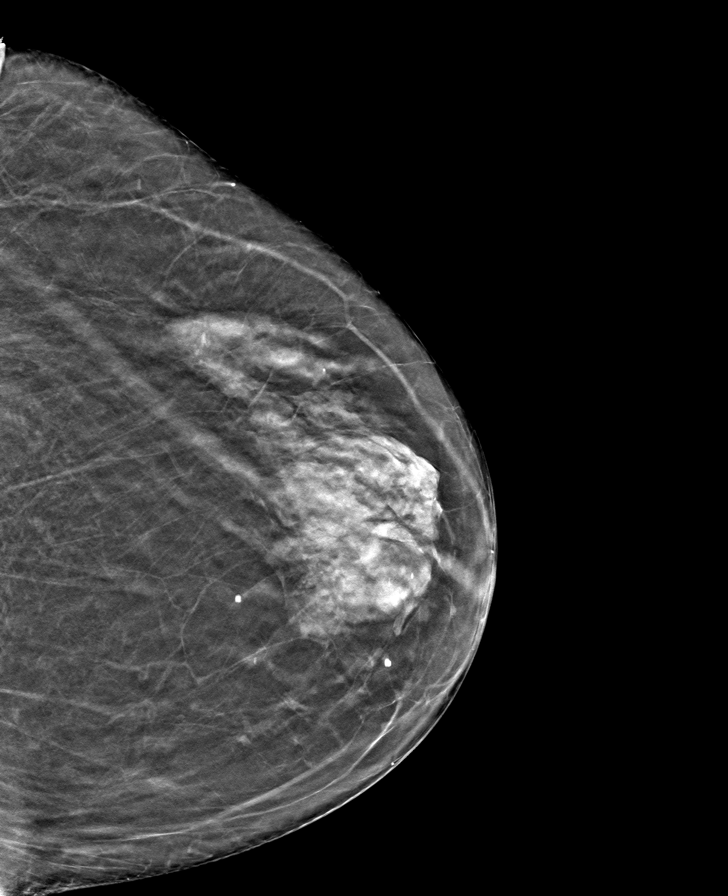

[L MLO tomo · tomo slice 29/57.0]
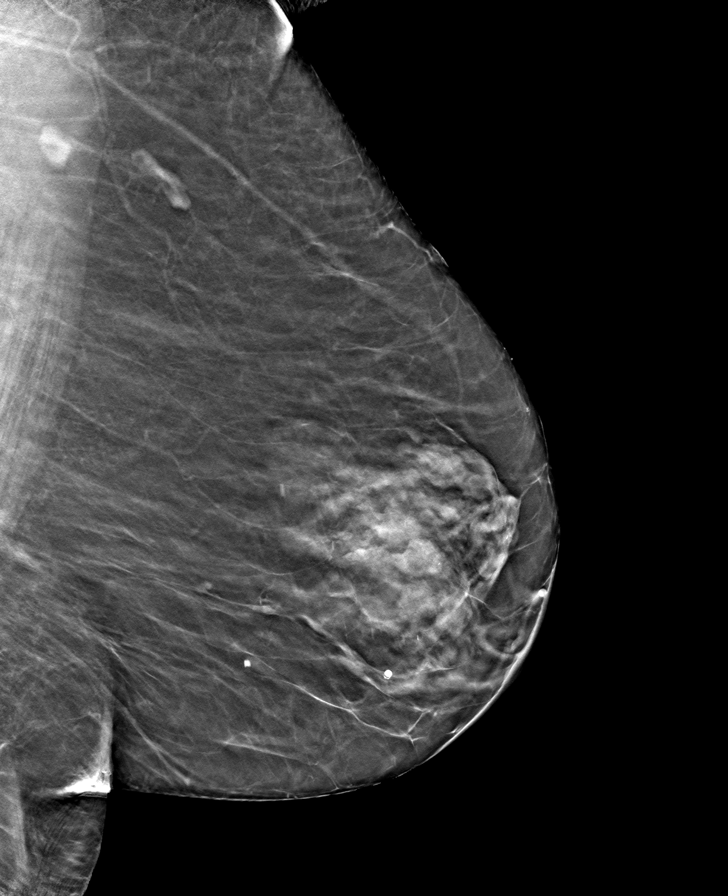

[R MLO tomo · tomo slice 31/60.0]
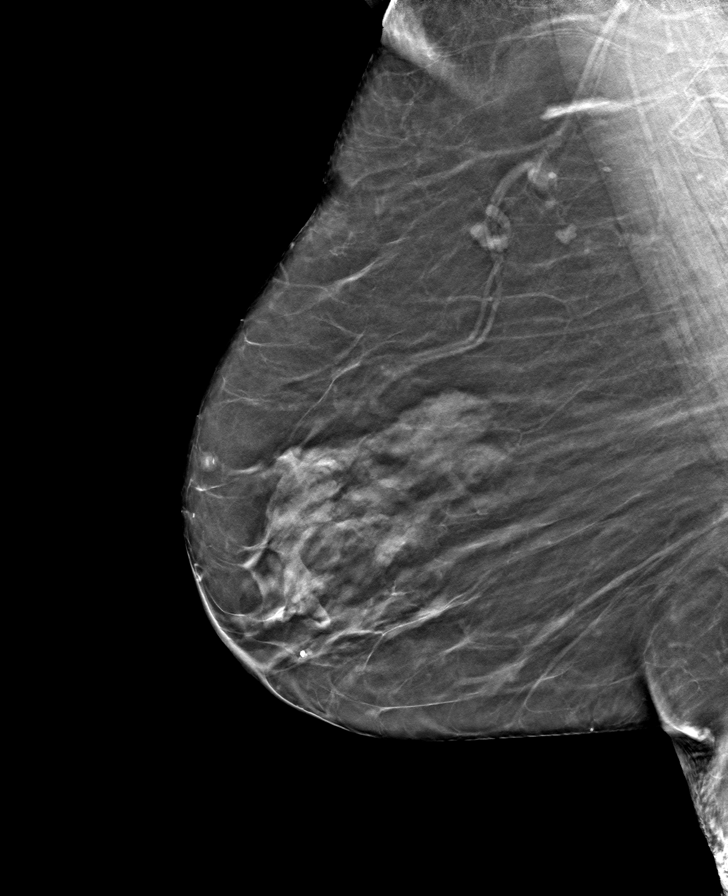

[R CC tomo · tomo slice 27/54.0]
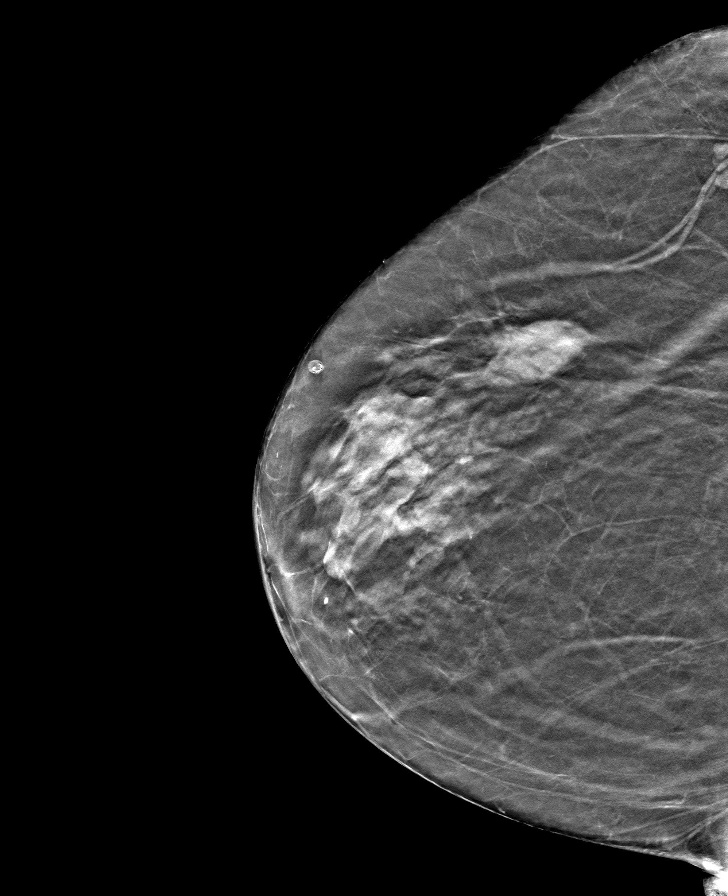

[8 of 24 positions shown; findings below may reference images not displayed]

ACR Breast Density Category c: The breast tissue is heterogeneously
dense, which may obscure small masses.
FINDINGS: There are no findings suspicious for malignancy.
IMPRESSION: No mammographic evidence of malignancy. A result letter of this
screening mammogram will be mailed directly to the patient.

RECOMMENDATION:
Screening mammogram in one year. (Code:[V2])

BI-RADS CATEGORY  1: Negative.

## 2021-05-27 ENCOUNTER — Other Ambulatory Visit: Payer: Self-pay | Admitting: Internal Medicine

## 2021-06-06 DIAGNOSIS — M5416 Radiculopathy, lumbar region: Secondary | ICD-10-CM | POA: Diagnosis not present

## 2021-06-19 ENCOUNTER — Telehealth: Payer: Self-pay | Admitting: *Deleted

## 2021-06-19 NOTE — Telephone Encounter (Signed)
Prolia due after 08/02/21. ? ?Benefit verification complete ? ?PA initiated via fax on 06/19/21 ?

## 2021-06-21 NOTE — Telephone Encounter (Signed)
Prolia [employer] Authorization Approved [id number]  Effective: 06/19/21-06/20/22 For: 2 visits $301 due at time of Prolia visit Due after 08/02/21  Appt scheduled on 08/03/21. Pt aware of amount due.

## 2021-06-26 DIAGNOSIS — M5416 Radiculopathy, lumbar region: Secondary | ICD-10-CM | POA: Diagnosis not present

## 2021-07-04 ENCOUNTER — Ambulatory Visit (INDEPENDENT_AMBULATORY_CARE_PROVIDER_SITE_OTHER): Payer: Medicare HMO | Admitting: Internal Medicine

## 2021-07-04 ENCOUNTER — Encounter: Payer: Self-pay | Admitting: Internal Medicine

## 2021-07-04 DIAGNOSIS — M25562 Pain in left knee: Secondary | ICD-10-CM | POA: Diagnosis not present

## 2021-07-04 DIAGNOSIS — I10 Essential (primary) hypertension: Secondary | ICD-10-CM | POA: Diagnosis not present

## 2021-07-04 DIAGNOSIS — L03032 Cellulitis of left toe: Secondary | ICD-10-CM | POA: Diagnosis not present

## 2021-07-04 DIAGNOSIS — M25512 Pain in left shoulder: Secondary | ICD-10-CM | POA: Diagnosis not present

## 2021-07-04 DIAGNOSIS — M5442 Lumbago with sciatica, left side: Secondary | ICD-10-CM | POA: Diagnosis not present

## 2021-07-04 DIAGNOSIS — G8929 Other chronic pain: Secondary | ICD-10-CM | POA: Diagnosis not present

## 2021-07-04 DIAGNOSIS — I700 Atherosclerosis of aorta: Secondary | ICD-10-CM

## 2021-07-04 MED ORDER — ROSUVASTATIN CALCIUM 10 MG PO TABS: 10.0000 mg | ORAL_TABLET | Freq: Every day | ORAL | 1 refills | Status: DC

## 2021-07-04 NOTE — Patient Instructions (Addendum)
  The "aortic  calcifications  that were seen on your lumbar CT  scan   means you have atherosclerosis and places you at increased risk for heart attack and stroke  I recommend that you consider a trial of generic Crestor daily at bedtime t prevent this from happening    Please arrange follow up fasting blood  function tests in 3 weeks and follow up with me in 4 weeks     You should be taking omeprazole ("prilosec" ) on an empty  stomach , daily in the morning .  Take your  your ibuprofen  either with your Prilosec or after you eat.    The prilosec is to prevent gastritis and ulcer from the ibuprofen

## 2021-07-04 NOTE — Progress Notes (Unsigned)
Subjective:  Patient ID: Lacey Brown, female    DOB: 30-Oct-1940  Age: 81 y.o. MRN: [id number]  CC: There were no encounter diagnoses.   HPI Eisa Conaway presents for  Chief Complaint  Patient presents with   Follow-up    Follow up on hypertension and aortic atherosclerosis    1) Reviewed findings of prior CT scan of lumbar spine in march noting ."Extensive Calcified aortic Atherosclerosis"  .  Patient is willing to  Initiate statin therpay starting with 20 mg crestor once daily and advance as tolerated to twice weekly.     2) HTN:  taking toprol 25 and telmisartan 80 mg daily .   Home readings     Outpatient Medications Prior to Visit  Medication Sig Dispense Refill   meloxicam (MOBIC) 15 MG tablet Take 1 tablet (15 mg total) by mouth daily as needed for pain. (Patient not taking: Reported on 02/19/2021) 30 tablet 3   acetaminophen (TYLENOL) 325 MG tablet Take 650 mg by mouth every 6 (six) hours as needed.     albuterol (VENTOLIN HFA) 108 (90 Base) MCG/ACT inhaler Inhale 2 puffs into the lungs every 6 (six) hours as needed for wheezing or shortness of breath. 1 each 2   Calcium Carbonate-Vit D-Min (CALCIUM 1200 PO) Take 1 tablet by mouth daily.     Cholecalciferol (VITAMIN D3) 2000 UNITS TABS Take 1 tablet by mouth daily.     Fluticasone-Salmeterol (ADVAIR DISKUS) 250-50 MCG/DOSE AEPB TAKE 1 PUFF BY MOUTH TWICE A DAY **RINSE MOUTH AFTER USE** 180 each 2   fluticasone-salmeterol (ADVAIR) 100-50 MCG/ACT AEPB Inhale 1 puff into the lungs 2 (two) times daily. 1 each 3   ibuprofen (ADVIL,MOTRIN) 400 MG tablet Take 400 mg by mouth daily as needed.      metoprolol succinate (TOPROL-XL) 25 MG 24 hr tablet TAKE 1 TABLET (25 MG TOTAL) BY MOUTH DAILY. 90 tablet 0   Omega-3 Fatty Acids (FISH OIL) 1000 MG CAPS Take 1 capsule by mouth daily.     omeprazole (PRILOSEC OTC) 20 MG tablet Take 20 mg by mouth as needed.      prednisoLONE acetate (PRED FORTE) 1 % ophthalmic suspension       telmisartan (MICARDIS) 80 MG tablet TAKE 1 TABLET BY MOUTH EVERY DAY 90 tablet 1   telmisartan (MICARDIS) 80 MG tablet Take 1 tablet (80 mg total) by mouth daily. 90 tablet 1   No facility-administered medications prior to visit.    Review of Systems;  Patient denies headache, fevers, malaise, unintentional weight loss, skin rash, eye pain, sinus congestion and sinus pain, sore throat, dysphagia,  hemoptysis , cough, dyspnea, wheezing, chest pain, palpitations, orthopnea, edema, abdominal pain, nausea, melena, diarrhea, constipation, flank pain, dysuria, hematuria, urinary  Frequency, nocturia, numbness, tingling, seizures,  Focal weakness, Loss of consciousness,  Tremor, insomnia, depression, anxiety, and suicidal ideation.      Objective:  There were no vitals taken for this visit.  BP Readings from Last 3 Encounters:  02/19/21 (!) 142/78  01/08/21 (!) 186/72  11/20/20 (!) 170/70    Wt Readings from Last 3 Encounters:  02/19/21 167 lb (75.8 kg)  12/05/20 168 lb (76.2 kg)  11/12/20 168 lb (76.2 kg)    General appearance: alert, cooperative and appears stated age Ears: normal TM's and external ear canals both ears Throat: lips, mucosa, and tongue normal; teeth and gums normal Neck: no adenopathy, no carotid bruit, supple, symmetrical, trachea midline and thyroid not enlarged, symmetric, no tenderness/mass/nodules  Back: symmetric, no curvature. ROM normal. No CVA tenderness. Lungs: clear to auscultation bilaterally Heart: regular rate and rhythm, S1, S2 normal, no murmur, click, rub or gallop Abdomen: soft, non-tender; bowel sounds normal; no masses,  no organomegaly Pulses: 2+ and symmetric Skin: Skin color, texture, turgor normal. No rashes or lesions Lymph nodes: Cervical, supraclavicular, and axillary nodes normal.  No results found for: HGBA1C  Lab Results  Component Value Date   CREATININE 0.68 02/19/2021   CREATININE 0.73 08/09/2020   CREATININE 1.09 08/01/2020     Lab Results  Component Value Date   WBC 9.1 02/19/2021   HGB 11.8 (L) 02/19/2021   HCT 36.6 02/19/2021   PLT 285.0 02/19/2021   GLUCOSE 92 02/19/2021   CHOL 203 (H) 08/01/2020   TRIG 58.0 08/01/2020   HDL 83.40 08/01/2020   LDLCALC 108 (H) 08/01/2020   ALT 12 02/19/2021   AST 13 02/19/2021   NA 131 (L) 02/19/2021   K 4.9 02/19/2021   CL 99 02/19/2021   CREATININE 0.68 02/19/2021   BUN 15 02/19/2021   CO2 26 02/19/2021   MICROALBUR 1.1 10/31/2020    MM 3D SCREEN BREAST BILATERAL  Result Date: 05/17/2021 CLINICAL DATA:  Screening. EXAM: DIGITAL SCREENING BILATERAL MAMMOGRAM WITH TOMOSYNTHESIS AND CAD TECHNIQUE: Bilateral screening digital craniocaudal and mediolateral oblique mammograms were obtained. Bilateral screening digital breast tomosynthesis was performed. The images were evaluated with computer-aided detection. COMPARISON:  Previous exam(s). ACR Breast Density Category c: The breast tissue is heterogeneously dense, which may obscure small masses. FINDINGS: There are no findings suspicious for malignancy. IMPRESSION: No mammographic evidence of malignancy. A result letter of this screening mammogram will be mailed directly to the patient. RECOMMENDATION: Screening mammogram in one year. (Code:SM-B-01Y) BI-RADS CATEGORY  1: Negative. Electronically Signed   By: Claudie Revering M.D.   On: 05/17/2021 08:41    Assessment & Plan:   Problem List Items Addressed This Visit   None   I spent a total of   minutes with this patient in a face to face visit on the date of this encounter reviewing the last office visit with me on        ,  most recent with patient's cardiologist in    ,  patient'ss diet and eating habits, home blood pressure readings ,  most recent imaging study ,   and post visit ordering of testing and therapeutics.    Follow-up: No follow-ups on file.   Crecencio Mc, MD

## 2021-07-05 ENCOUNTER — Encounter: Payer: Self-pay | Admitting: Internal Medicine

## 2021-07-05 DIAGNOSIS — M545 Low back pain, unspecified: Secondary | ICD-10-CM | POA: Insufficient documentation

## 2021-07-05 DIAGNOSIS — G8929 Other chronic pain: Secondary | ICD-10-CM | POA: Insufficient documentation

## 2021-07-05 NOTE — Assessment & Plan Note (Addendum)
Reviewed findings of prior CT scan today..  Patient is willing to  Initiate statin therpay starting with 10 mg crestor once daily  

## 2021-07-05 NOTE — Assessment & Plan Note (Addendum)
Aggravated by a fall with no evidence of fractures by plain films and by MRI.  She has had multiple steroid injections in her left knee ,  Apparently one in Oct 2022 by Dr Marlou Sa and more recently by Dr Ninfa Linden

## 2021-07-05 NOTE — Assessment & Plan Note (Signed)
Treated by Ninfa Linden for impingement of left shoulder  with /a steroid  injection

## 2021-07-05 NOTE — Assessment & Plan Note (Signed)
Has been seeing Kary Kos ,  CT and MRI lumbar spine done recently

## 2021-07-05 NOTE — Assessment & Plan Note (Signed)
Plain films were negative for erosions suggestive of gout and negative for osteomyelitis

## 2021-07-05 NOTE — Assessment & Plan Note (Signed)
she reports compliance with medication regimen  Of metoprolol and telmisartan,   But continues to have  elevated readings  And ho home readings for comparison.  She is uing NSAIDs daily.  Discussed goal of  130/80 for patients to preserve renal function.  She has been asked to check her  BP  at home and  submit readings for evaluation. Renal function, electrolytes and screen for proteinuria are all normal .  Lab Results  Component Value Date   CREATININE 0.68 02/19/2021   Lab Results  Component Value Date   NA 131 (L) 02/19/2021   K 4.9 02/19/2021   CL 99 02/19/2021   CO2 26 02/19/2021   Lab Results  Component Value Date   MICROALBUR 1.1 10/31/2020

## 2021-07-12 DIAGNOSIS — M5416 Radiculopathy, lumbar region: Secondary | ICD-10-CM | POA: Diagnosis not present

## 2021-07-19 ENCOUNTER — Telehealth: Payer: Self-pay | Admitting: *Deleted

## 2021-07-19 DIAGNOSIS — I10 Essential (primary) hypertension: Secondary | ICD-10-CM

## 2021-07-19 DIAGNOSIS — R5383 Other fatigue: Secondary | ICD-10-CM

## 2021-07-19 DIAGNOSIS — D649 Anemia, unspecified: Secondary | ICD-10-CM

## 2021-07-19 DIAGNOSIS — E7800 Pure hypercholesterolemia, unspecified: Secondary | ICD-10-CM

## 2021-07-19 DIAGNOSIS — R7301 Impaired fasting glucose: Secondary | ICD-10-CM

## 2021-07-19 NOTE — Telephone Encounter (Signed)
Please place future orders for lab appt.  

## 2021-07-19 NOTE — Telephone Encounter (Signed)
I have pended labs for lab appt on 07/25/2021. Is there anything else that needs to be ordered.

## 2021-07-25 ENCOUNTER — Other Ambulatory Visit (INDEPENDENT_AMBULATORY_CARE_PROVIDER_SITE_OTHER): Payer: Medicare HMO

## 2021-07-25 DIAGNOSIS — R7301 Impaired fasting glucose: Secondary | ICD-10-CM | POA: Diagnosis not present

## 2021-07-25 DIAGNOSIS — D649 Anemia, unspecified: Secondary | ICD-10-CM | POA: Diagnosis not present

## 2021-07-25 DIAGNOSIS — R5383 Other fatigue: Secondary | ICD-10-CM

## 2021-07-25 DIAGNOSIS — I10 Essential (primary) hypertension: Secondary | ICD-10-CM | POA: Diagnosis not present

## 2021-07-25 LAB — CBC WITH DIFFERENTIAL/PLATELET
Basophils Absolute: 0.1 10*3/uL (ref 0.0–0.1)
Basophils Relative: 0.8 % (ref 0.0–3.0)
Eosinophils Absolute: 0.2 10*3/uL (ref 0.0–0.7)
Eosinophils Relative: 1.8 % (ref 0.0–5.0)
HCT: 37.6 % (ref 36.0–46.0)
Hemoglobin: 12 g/dL (ref 12.0–15.0)
Lymphocytes Relative: 22.7 % (ref 12.0–46.0)
Lymphs Abs: 1.8 10*3/uL (ref 0.7–4.0)
MCHC: 31.8 g/dL (ref 30.0–36.0)
MCV: 85 fl (ref 78.0–100.0)
Monocytes Absolute: 1 10*3/uL (ref 0.1–1.0)
Monocytes Relative: 11.7 % (ref 3.0–12.0)
Neutro Abs: 5.1 10*3/uL (ref 1.4–7.7)
Neutrophils Relative %: 63 % (ref 43.0–77.0)
Platelets: 212 10*3/uL (ref 150.0–400.0)
RBC: 4.42 Mil/uL (ref 3.87–5.11)
RDW: 20.7 % — ABNORMAL HIGH (ref 11.5–15.5)
WBC: 8.2 10*3/uL (ref 4.0–10.5)

## 2021-07-25 LAB — COMPREHENSIVE METABOLIC PANEL
ALT: 13 U/L (ref 0–35)
AST: 14 U/L (ref 0–37)
Albumin: 4.2 g/dL (ref 3.5–5.2)
Alkaline Phosphatase: 35 U/L — ABNORMAL LOW (ref 39–117)
BUN: 17 mg/dL (ref 6–23)
CO2: 29 mEq/L (ref 19–32)
Calcium: 9.3 mg/dL (ref 8.4–10.5)
Chloride: 95 mEq/L — ABNORMAL LOW (ref 96–112)
Creatinine, Ser: 0.68 mg/dL (ref 0.40–1.20)
GFR: 81.15 mL/min (ref >60.00)
Glucose, Bld: 89 mg/dL (ref 70–99)
Potassium: 4.7 mEq/L (ref 3.5–5.1)
Sodium: 129 mEq/L — ABNORMAL LOW (ref 135–145)
Total Bilirubin: 0.5 mg/dL (ref 0.2–1.2)
Total Protein: 6.3 g/dL (ref 6.0–8.3)

## 2021-07-25 LAB — MICROALBUMIN / CREATININE URINE RATIO
Creatinine,U: 14.2 mg/dL
Microalb Creat Ratio: 4.9 mg/g (ref 0.0–30.0)
Microalb, Ur: <0.7 mg/dL (ref 0.0–1.9)

## 2021-07-25 LAB — HEMOGLOBIN A1C: Hgb A1c MFr Bld: 6 % (ref 4.6–6.5)

## 2021-07-25 LAB — TSH: TSH: 2.71 u[IU]/mL (ref 0.35–5.50)

## 2021-08-01 ENCOUNTER — Ambulatory Visit (INDEPENDENT_AMBULATORY_CARE_PROVIDER_SITE_OTHER): Payer: Medicare HMO | Admitting: Internal Medicine

## 2021-08-01 ENCOUNTER — Encounter: Payer: Self-pay | Admitting: Internal Medicine

## 2021-08-01 VITALS — BP 168/70 | HR 71 | Temp 98.4°F | Ht 59.0 in | Wt 164.2 lb

## 2021-08-01 DIAGNOSIS — Q069 Congenital malformation of spinal cord, unspecified: Secondary | ICD-10-CM | POA: Diagnosis not present

## 2021-08-01 DIAGNOSIS — I700 Atherosclerosis of aorta: Secondary | ICD-10-CM | POA: Diagnosis not present

## 2021-08-01 DIAGNOSIS — I10 Essential (primary) hypertension: Secondary | ICD-10-CM | POA: Diagnosis not present

## 2021-08-01 DIAGNOSIS — M810 Age-related osteoporosis without current pathological fracture: Secondary | ICD-10-CM

## 2021-08-01 DIAGNOSIS — E871 Hypo-osmolality and hyponatremia: Secondary | ICD-10-CM | POA: Diagnosis not present

## 2021-08-01 DIAGNOSIS — E7800 Pure hypercholesterolemia, unspecified: Secondary | ICD-10-CM | POA: Diagnosis not present

## 2021-08-01 MED ORDER — AMLODIPINE BESYLATE 2.5 MG PO TABS: 2.5000 mg | ORAL_TABLET | Freq: Every day | ORAL | 2 refills | Status: DC

## 2021-08-01 NOTE — Patient Instructions (Addendum)
  I recommend continuing rosuvastatin as often as tolerated to prevent strokes and heart attacks  :  you can take it daily, or every other day   Your blood pressure is elevated and we need to resume a 3rd medication .  We will start  LOW DOSE of amlodipine  2.5 mg   Causes for your elevated blood pressure may include  Use of ibuprofen   Renal artery stenosis   Obstructive Sleep Apnea    I want you to take  2000 mg of acetominophen (tylenol) daily  In divided doses (500 mg every 6 hours  Or 1000 mg every 12 hours.) and STOP USING IBUPROFEN FOR ONE WEEK  RETURN FOR A BP CHECK DURING THAT WEEK   Stop the ibuprofen for one week .  Return during that week

## 2021-08-01 NOTE — Assessment & Plan Note (Signed)
With degenerative changes mild spinal stenosis

## 2021-08-01 NOTE — Assessment & Plan Note (Addendum)
Chronic and stable for years.  Drinks water and unsweet tea. Salts food to taste  Not taking a diuretic

## 2021-08-01 NOTE — Assessment & Plan Note (Addendum)
UNCONTROLLED ON MAX 2 MEDS.  Stopping ibuprofen,  Starting amlodipine 2.5 mg RTC for rn visit during ibuprofen suspend .  RETURN ON FRIDAY FOR RN VISIT TO CHECK BP

## 2021-08-01 NOTE — Progress Notes (Signed)
Subjective:  Patient ID: Lacey Brown, female    DOB: [birth year]  Age: 81 y.o. MRN: [id number]  CC: The primary encounter diagnosis was Primary hypertension. 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   HPI Lacey Brown presents for follow up on aortic atherosclerosis with start of statin, chronic pain involving multiple joints, and And hypertension  Chief Complaint  Patient presents with   Follow-up    4 week follow up   1) AA:  she is tolerating rosuvastatin 10 mg daily  taking it at least 3-4 times weekly  has noted some muscle pain but has chronic pain from mutliple joint problems   2) HTN:  Patient is taking telmisartan 80 mg metoprolol succinate 25 mg daily  as prescribed and notes no adverse effects.  Home BP readings have not been done at home.    She is avoiding added salt in her diet and walking regularly in a limited  pattern (using a grocery cart)  exercise  .   3) Osteoporosis:  to start Prolia on June 30  4) Chronic lumbar  spinal stenosis : receiving ESI, injections ordered by  Dr Saintclair Halsted, done by Dr Davy Pique.  .  3rd one was done 3 weeks ago and her pain has improved and she is walking  better .  Trying to avoid surgery .  Limits her activities to grocery shopping with a cart . Taking tylenol and ibuprofen daily ( 2 to 4 daily )      Outpatient Medications Prior to Visit  Medication Sig Dispense Refill   acetaminophen (TYLENOL) 325 MG tablet Take 650 mg by mouth every 6 (six) hours as needed.     Calcium Carbonate-Vit D-Min (CALCIUM 1200 PO) Take 1 tablet by mouth daily.     Cholecalciferol (VITAMIN D3) 2000 UNITS TABS Take 1 tablet by mouth daily.     fluticasone-salmeterol (ADVAIR) 100-50 MCG/ACT AEPB Inhale 1 puff into the lungs 2 (two) times  daily. 1 each 3   ibuprofen (ADVIL,MOTRIN) 400 MG tablet Take 400 mg by mouth daily as needed.      metoprolol succinate (TOPROL-XL) 25 MG 24 hr tablet TAKE 1 TABLET (25 MG TOTAL) BY MOUTH DAILY. 90 tablet 0   Omega-3 Fatty Acids (FISH OIL) 1000 MG CAPS Take 1 capsule by mouth daily.     omeprazole (PRILOSEC OTC) 20 MG tablet Take 20 mg by mouth as needed.      prednisoLONE acetate (PRED FORTE) 1 % ophthalmic suspension      rosuvastatin (CRESTOR) 10 MG tablet Take 1 tablet (10 mg total) by mouth daily. 90 tablet 1   telmisartan (MICARDIS) 80 MG tablet TAKE 1 TABLET BY MOUTH EVERY DAY 90 tablet 1   telmisartan (MICARDIS) 80 MG tablet Take 1 tablet (80 mg total) by mouth daily. 90 tablet 1   albuterol (VENTOLIN HFA) 108 (90 Base) MCG/ACT inhaler Inhale 2 puffs into the lungs every 6 (six) hours as needed for wheezing or shortness of breath. (Patient not taking: Reported on 08/01/2021) 1 each 2   No facility-administered medications prior to visit.    Review of Systems;  Patient denies headache, fevers, malaise, unintentional weight loss, skin rash, eye pain, sinus congestion and sinus pain, sore throat, dysphagia,  hemoptysis , cough, dyspnea, wheezing, chest pain, palpitations, orthopnea, edema, abdominal pain, nausea, melena, diarrhea, constipation, flank pain, dysuria,  hematuria, urinary  Frequency, nocturia, numbness, tingling, seizures,  Focal weakness, Loss of consciousness,  Tremor, insomnia, depression, anxiety, and suicidal ideation.      Objective:  BP (!) 168/70 (BP Location: Left Arm, Patient Position: Sitting, Cuff Size: Normal)   Pulse 71   Temp 98.4 F (36.9 C) (Oral)   Ht '4\' 11"'$  (1.499 m)   Wt 164 lb 3.2 oz (74.5 kg)   SpO2 96%   BMI 33.16 kg/m   BP Readings from Last 3 Encounters:  08/03/21 (!) 150/78  08/01/21 (!) 168/70  07/04/21 (!) 158/82    Wt Readings from Last 3 Encounters:  08/01/21 164 lb 3.2 oz (74.5 kg)  07/04/21 162 lb 3.2 oz (73.6 kg)  02/19/21 167  lb (75.8 kg)    General appearance: alert, cooperative and appears stated age Ears: normal TM's and external ear canals both ears Throat: lips, mucosa, and tongue normal; teeth and gums normal Neck: no adenopathy, no carotid bruit, supple, symmetrical, trachea midline and thyroid not enlarged, symmetric, no tenderness/mass/nodules Back: symmetric, no curvature. ROM normal. No CVA tenderness. Lungs: clear to auscultation bilaterally Heart: regular rate and rhythm, S1, S2 normal, no murmur, click, rub or gallop Abdomen: soft, non-tender; bowel sounds normal; no masses,  no organomegaly Pulses: 2+ and symmetric Skin: Skin color, texture, turgor normal. No rashes or lesions Lymph nodes: Cervical, supraclavicular, and axillary nodes normal.  Lab Results  Component Value Date   HGBA1C 6.0 07/25/2021    Lab Results  Component Value Date   CREATININE 0.68 07/25/2021   CREATININE 0.68 02/19/2021   CREATININE 0.73 08/09/2020    Lab Results  Component Value Date   WBC 8.2 07/25/2021   HGB 12.0 07/25/2021   HCT 37.6 07/25/2021   PLT 212.0 07/25/2021   GLUCOSE 89 07/25/2021   CHOL 203 (H) 08/01/2020   TRIG 58.0 08/01/2020   HDL 83.40 08/01/2020   LDLCALC 108 (H) 08/01/2020   ALT 13 07/25/2021   AST 14 07/25/2021   NA 129 (L) 07/25/2021   K 4.7 07/25/2021   CL 95 (L) 07/25/2021   CREATININE 0.68 07/25/2021   BUN 17 07/25/2021   CO2 29 07/25/2021   TSH 2.71 07/25/2021   HGBA1C 6.0 07/25/2021   MICROALBUR <0.7 07/25/2021    MM 3D SCREEN BREAST BILATERAL  Result Date: 05/17/2021 CLINICAL DATA:  Screening. EXAM: DIGITAL SCREENING BILATERAL MAMMOGRAM WITH TOMOSYNTHESIS AND CAD TECHNIQUE: Bilateral screening digital craniocaudal and mediolateral oblique mammograms were obtained. Bilateral screening digital breast tomosynthesis was performed. The images were evaluated with computer-aided detection. COMPARISON:  Previous exam(s). ACR Breast Density Category c: The breast tissue is  heterogeneously dense, which may obscure small masses. FINDINGS: There are no findings suspicious for malignancy. IMPRESSION: No mammographic evidence of malignancy. A result letter of this screening mammogram will be mailed directly to the patient. RECOMMENDATION: Screening mammogram in one year. (Code:SM-B-01Y) BI-RADS CATEGORY  1: Negative. Electronically Signed   By: Claudie Revering M.D.   On: 05/17/2021 08:41    Assessment & Plan:   Problem List Items Addressed This Visit     Chronic hyponatremia    Chronic and stable for years.  Drinks water and unsweet tea. Salts food to taste  Not taking a diuretic       HTN (hypertension) - Primary    UNCONTROLLED ON MAX 2 MEDS.  Stopping ibuprofen,  Starting amlodipine 2.5 mg RTC for rn visit during ibuprofen suspend .  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   patient'ss diet and eating habits,  ,  most recent imaging study ,   and post visit ordering of testing and  therapeutics.    Follow-up: Return in about 3 months (around 11/01/2021).   Crecencio Mc, MD

## 2021-08-01 NOTE — Assessment & Plan Note (Addendum)
Chronic thoracic and  L1-L2 vertebral compression fractures noted on palin films in 2019 and again on March 2023 CT scan

## 2021-08-02 DIAGNOSIS — M5440 Lumbago with sciatica, unspecified side: Secondary | ICD-10-CM | POA: Diagnosis not present

## 2021-08-03 ENCOUNTER — Ambulatory Visit (INDEPENDENT_AMBULATORY_CARE_PROVIDER_SITE_OTHER): Payer: Medicare HMO

## 2021-08-03 ENCOUNTER — Telehealth: Payer: Self-pay

## 2021-08-03 VITALS — BP 150/78 | HR 79

## 2021-08-03 DIAGNOSIS — M810 Age-related osteoporosis without current pathological fracture: Secondary | ICD-10-CM | POA: Diagnosis not present

## 2021-08-03 MED ORDER — DENOSUMAB 60 MG/ML ~~LOC~~ SOSY
60.0000 mg | PREFILLED_SYRINGE | Freq: Once | SUBCUTANEOUS | Status: AC
  Administered 2021-08-03: 60 mg via SUBCUTANEOUS

## 2021-08-03 NOTE — Telephone Encounter (Signed)
I spoke with patient to once again go over Dr. Lupita Dawn message & explain how medication was to be taken. Pt did verbalize understanding of this.

## 2021-08-03 NOTE — Telephone Encounter (Signed)
Patient came in today to have her BP checked & was inquiring why her Advair dosing was changed. It appears ever since she saw Joycelyn Schmid back in March that her dose was changed to the 100- 50 mcg. She said that she was always on the 250-50 mcg when she saw [place]. She only takes once a day but wanted to know why it ws changed & if she needed to be on the higher dosage? I did not see why it ws changed & has just continued to be refilled at lower dose.

## 2021-08-03 NOTE — Progress Notes (Addendum)
Patient came in today for Prolia injection given in left arm SQ. Patient tolerated well. Patient also had her BP checked at initially was 170/64 P 68. I had patient sit for 10 minutes to recheck. Pt stated that her BP always ran high & that was a pretty usually reading for her. After the 10 minutes patent's BP did come down to 150/78 P 79. I let patient go based on this & will send note for review.    BP Readings from Last 3 Encounters:  08/03/21 (!) 150/78  08/01/21 (!) 168/70  07/04/21 (!) 158/82   Reviewed Dr Derrel Nip note 08/01/21 , suspended ibuprofen and started amlodipine 2.'5mg'$ . Blood pressure improved from 168/70 to 150/58.   Will forward results to PCP for further medication management  Mable Paris, NP

## 2021-08-03 NOTE — Telephone Encounter (Signed)
FYI Tullo Pt came in BP check today, improved with amlodipine.   Will defer further medication adjustment to you  BP Readings from Last 3 Encounters:  08/03/21 (!) 150/78  08/01/21 (!) 168/70  07/04/21 (!) 158/82

## 2021-08-03 NOTE — Telephone Encounter (Signed)
Pt called and she was advised that she should be taking the lower dose BID. Pt stated that she was not having any SOB. She said that it was hard to remember every 12 hours ti take that second dose. I advised again that was how the medication is to be taken & patient said that she would do her beat to remember.

## 2021-08-03 NOTE — Telephone Encounter (Signed)
Patient states she just spoke with Cheri Rous, CMA, and would like to clarify something she told her.  Patient was transferred to [facility].

## 2021-08-04 NOTE — Assessment & Plan Note (Signed)
Reviewed findings of prior CT scan today in detail,  With a length review of the reasons for continuining statin therapy

## 2021-08-08 ENCOUNTER — Ambulatory Visit: Payer: Medicare HMO

## 2021-08-16 ENCOUNTER — Telehealth: Payer: Self-pay

## 2021-08-16 ENCOUNTER — Ambulatory Visit: Payer: Medicare HMO

## 2021-08-16 NOTE — Telephone Encounter (Signed)
I called patient to try to get her scheduled for AWV. Pt stated that last AWV she had she want that impressed & she stated that she really did not want to schedule at this time. She asked that we call her back around October.

## 2021-08-16 NOTE — Progress Notes (Signed)
Patient here for nurse visit BP check per order from Dr. Derrel Nip.   Patient reports compliance with prescribed BP medications: yes  Last dose of BP medication: this morning  BP Readings from Last 3 Encounters:  08/16/21 120/70  08/03/21 (!) 150/78  08/01/21 (!) 168/70   Pulse Readings from Last 3 Encounters:  08/03/21 79  08/01/21 71  07/04/21 68    Per Dr. Derrel Nip Keep your current regimen of medication.  Patient verbalized understanding of instructions.   Gordy Councilman, CMA

## 2021-08-17 ENCOUNTER — Telehealth: Payer: Self-pay | Admitting: Internal Medicine

## 2021-08-17 NOTE — Telephone Encounter (Signed)
Pt called stating she want the cma to call her regarding her BP

## 2021-08-17 NOTE — Telephone Encounter (Signed)
Pt is aware and gave a verbal understanding.  

## 2021-08-17 NOTE — Telephone Encounter (Signed)
Pt is concerned that her bp reading from yesterday's nurse visit is not correct. Pt stated that she has never had a bp reading that low and later that evening she had her friend check it, it was 148/68 at that time. Pt is wanting to know if she should come back in to have rechecked.

## 2021-08-20 DIAGNOSIS — M4726 Other spondylosis with radiculopathy, lumbar region: Secondary | ICD-10-CM | POA: Diagnosis not present

## 2021-08-23 DIAGNOSIS — M4726 Other spondylosis with radiculopathy, lumbar region: Secondary | ICD-10-CM | POA: Diagnosis not present

## 2021-08-24 ENCOUNTER — Other Ambulatory Visit: Payer: Self-pay | Admitting: Internal Medicine

## 2021-08-27 DIAGNOSIS — M4726 Other spondylosis with radiculopathy, lumbar region: Secondary | ICD-10-CM | POA: Diagnosis not present

## 2021-08-29 DIAGNOSIS — M4726 Other spondylosis with radiculopathy, lumbar region: Secondary | ICD-10-CM | POA: Diagnosis not present

## 2021-09-05 DIAGNOSIS — M4726 Other spondylosis with radiculopathy, lumbar region: Secondary | ICD-10-CM | POA: Diagnosis not present

## 2021-09-12 DIAGNOSIS — M4726 Other spondylosis with radiculopathy, lumbar region: Secondary | ICD-10-CM | POA: Diagnosis not present

## 2021-09-19 DIAGNOSIS — M4726 Other spondylosis with radiculopathy, lumbar region: Secondary | ICD-10-CM | POA: Diagnosis not present

## 2021-09-26 ENCOUNTER — Encounter: Payer: Self-pay | Admitting: Orthopaedic Surgery

## 2021-09-26 ENCOUNTER — Ambulatory Visit: Payer: Medicare HMO | Admitting: Orthopaedic Surgery

## 2021-09-26 DIAGNOSIS — M25562 Pain in left knee: Secondary | ICD-10-CM

## 2021-09-26 DIAGNOSIS — M7542 Impingement syndrome of left shoulder: Secondary | ICD-10-CM | POA: Diagnosis not present

## 2021-09-26 DIAGNOSIS — G8929 Other chronic pain: Secondary | ICD-10-CM

## 2021-09-26 MED ORDER — METHYLPREDNISOLONE ACETATE 40 MG/ML IJ SUSP
40.0000 mg | INTRAMUSCULAR | Status: AC | PRN
  Administered 2021-09-26: 40 mg via INTRA_ARTICULAR

## 2021-09-26 MED ORDER — LIDOCAINE HCL 1 % IJ SOLN
3.0000 mL | INTRAMUSCULAR | Status: AC | PRN
  Administered 2021-09-26: 3 mL

## 2021-09-26 NOTE — Progress Notes (Signed)
Office Visit Note   Patient: Lacey Brown           Date of Birth: 09-Apr-1940           MRN: [id number] Visit Date: 09/26/2021              Requested by: Crecencio Mc, MD [address] [place],  [place] [address] PCP: Crecencio Mc, MD   Assessment & Plan: Visit Diagnoses:  1. Chronic pain of left knee   2. Impingement syndrome of left shoulder     Plan: Per the patient's request I did place a steroid injection in her left shoulder today and her left knee which she tolerated well.  We can always see her the first week in December for repeat injections if needed.  All questions and concerns were answered and addressed.  Follow-Up Instructions: Return in about 15 weeks (around 01/09/2022), or if symptoms worsen or fail to improve.   Orders:  Orders Placed This Encounter  Procedures   Large Joint Inj   Large Joint Inj   No orders of the defined types were placed in this encounter.     Procedures: Large Joint Inj: L knee on 09/26/2021 1:26 PM Indications: diagnostic evaluation and pain Details: 22 G 1.5 in needle, superolateral approach  Arthrogram: No  Medications: 3 mL lidocaine 1 %; 40 mg methylPREDNISolone acetate 40 MG/ML Outcome: tolerated well, no immediate complications Procedure, treatment alternatives, risks and benefits explained, specific risks discussed. Consent was given by the patient. Immediately prior to procedure a time out was called to verify the correct patient, procedure, equipment, support staff and site/side marked as required. Patient was prepped and draped in the usual sterile fashion.    Large Joint Inj: L subacromial bursa on 09/26/2021 1:26 PM Indications: diagnostic evaluation Details: 22 G 1.5 in needle  Arthrogram: No  Medications: 3 mL lidocaine 1 %; 40 mg methylPREDNISolone acetate 40 MG/ML Outcome: tolerated well, no immediate complications Procedure, treatment alternatives, risks and benefits explained,  specific risks discussed. Consent was given by the patient. Immediately prior to procedure a time out was called to verify the correct patient, procedure, equipment, support staff and site/side marked as required. Patient was prepped and draped in the usual sterile fashion.       Clinical Data: No additional findings.   Subjective: Chief Complaint  Patient presents with   Left Knee - Follow-up  The patient is an 81 year old female who is 4 months out from steroid injections in her left shoulder and her left knee.  She says that is helped great but she would like to have injections again.  We diagnosed her with some mild arthritis of her left knee and impingement of her left shoulder.  She has had no other acute change in her medical status.  She does deal with chronic back pain and sees Dr. Saintclair Halsted for this.  She is in physical therapy but that is winding down as a relates to her back.  She still reports daily stiffness with her low back and hips.  She is not a diabetic.  She is requesting steroid injections in her left shoulder and left knee today.  HPI  Review of Systems There is currently listed no fever, chills, nausea, vomiting  Objective: Vital Signs: There were no vitals taken for this visit.  Physical Exam She is alert and orient x3 and in no acute distress Ortho Exam Examination of the left shoulder shows some signs of impingement  and some weakness but the shoulder is well located.  The left knee shows no effusion with good range of motion and just some global tenderness.  The left knee is ligamentously stable. Specialty Comments:  No specialty comments available.  Imaging: No results found.   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[place]) 08/01/2021   Chronic hyponatremia 08/01/2021   Chronic left-sided low back pain 07/05/2021   Chronic left shoulder pain 07/05/2021   Edema of left lower extremity 02/19/2021    Pain of left great toe 02/19/2021   Aortic atherosclerosis ([place]) 08/02/2020   Osteoarthritis of knees, bilateral 08/02/2020   Osteoporosis 08/02/2020   Left knee pain 11/17/2019   Arthritis 10/24/2014   HTN (hypertension) 10/24/2014   Asthma 10/24/2014   GERD (gastroesophageal reflux disease) 10/24/2014   Past Medical History:  Diagnosis Date   Arthritis    back, neck; right shoulder;    Asthma    using advair prn   Chicken pox    Hyperlipidemia    Hypertension    controlled with medication;     Family History  Problem Relation Age of Onset   Heart disease Mother    Heart disease Father    Arthritis Maternal Grandmother    Diabetes Neg Hx    Cancer Neg Hx    Stroke Neg Hx     Past Surgical History:  Procedure Laterality Date   BREAST EXCISIONAL BIOPSY Left    TONSILLECTOMY AND ADENOIDECTOMY     Social History   Occupational History   Not on file  Tobacco Use   Smoking status: Former    Types: Cigarettes    Quit date: 04/17/2009    Years since quitting: 12.4   Smokeless tobacco: Never  Vaping Use   Vaping Use: Never used  Substance and Sexual Activity   Alcohol use: Yes    Alcohol/week: 0.0 standard drinks of alcohol    Comment: occasional   Drug use: No   Sexual activity: Never

## 2021-09-27 ENCOUNTER — Telehealth: Payer: Self-pay | Admitting: Orthopaedic Surgery

## 2021-09-27 NOTE — Telephone Encounter (Signed)
Patient called asked what type of therapy was Dr. Ninfa Linden referring to at her appointment yesterday? Patient asked what did Dr. Ninfa Linden have in mind. The number to contact patient is [phone]

## 2021-09-28 ENCOUNTER — Other Ambulatory Visit: Payer: Self-pay

## 2021-09-28 ENCOUNTER — Telehealth: Payer: Self-pay | Admitting: Orthopaedic Surgery

## 2021-09-28 DIAGNOSIS — G8929 Other chronic pain: Secondary | ICD-10-CM

## 2021-09-28 DIAGNOSIS — M7542 Impingement syndrome of left shoulder: Secondary | ICD-10-CM

## 2021-09-28 NOTE — Telephone Encounter (Signed)
Pt called back stating she goes to [facility] therapy in [place] on [address]. Please add shoulder and knee. Pt phone number is [phone]

## 2021-09-28 NOTE — Telephone Encounter (Signed)
Referral sent 

## 2021-09-28 NOTE — Telephone Encounter (Signed)
LMOM for patient to call back and let me know where she goes to PT and I will send order adding shoulder and knee

## 2021-10-09 DIAGNOSIS — Z6833 Body mass index (BMI) 33.0-33.9, adult: Secondary | ICD-10-CM | POA: Diagnosis not present

## 2021-10-09 DIAGNOSIS — M5440 Lumbago with sciatica, unspecified side: Secondary | ICD-10-CM | POA: Diagnosis not present

## 2021-10-16 ENCOUNTER — Other Ambulatory Visit: Payer: Self-pay | Admitting: Family

## 2021-10-23 DIAGNOSIS — M47816 Spondylosis without myelopathy or radiculopathy, lumbar region: Secondary | ICD-10-CM | POA: Diagnosis not present

## 2021-10-27 ENCOUNTER — Other Ambulatory Visit: Payer: Self-pay | Admitting: Family

## 2021-11-07 ENCOUNTER — Encounter: Payer: Self-pay | Admitting: Internal Medicine

## 2021-11-07 ENCOUNTER — Ambulatory Visit (INDEPENDENT_AMBULATORY_CARE_PROVIDER_SITE_OTHER): Payer: Medicare HMO | Admitting: Internal Medicine

## 2021-11-07 VITALS — BP 140/74 | HR 75 | Temp 98.1°F | Ht 59.0 in | Wt 162.4 lb

## 2021-11-07 DIAGNOSIS — I700 Atherosclerosis of aorta: Secondary | ICD-10-CM

## 2021-11-07 DIAGNOSIS — I10 Essential (primary) hypertension: Secondary | ICD-10-CM | POA: Diagnosis not present

## 2021-11-07 DIAGNOSIS — E7800 Pure hypercholesterolemia, unspecified: Secondary | ICD-10-CM

## 2021-11-07 MED ORDER — CARVEDILOL 6.25 MG PO TABS: 6.2500 mg | ORAL_TABLET | Freq: Two times a day (BID) | ORAL | 3 refills | Status: DC

## 2021-11-07 NOTE — Progress Notes (Unsigned)
Subjective:  Patient ID: Lacey Brown, female    DOB: [birth year]  Age: 81 y.o. MRN: [id number]  CC: There were no encounter diagnoses.   HPI Lacey Brown presents for  Chief Complaint  Patient presents with   Follow-up    3 month follow up on hypertension   1) htn:  was seen in late June  amlodipine added to max dose telmisartan and metoprolol . Bp improved but not at goal.does not chack at home .  Taking 4 tylenol daily  has not taken ibuprofen since last visit.  Has occasional orthostasis     2) AORTIC ATHEROSCLEROSIS  3) Osteoarthritis :  receiving multiple steroid injections in the lumbar back, shoulder,  knees  , some diagnostic, others therapeutic .  Sh eis having a lot of pain in the mid thoracic spine since working in the yard planting a mum. 1 week ago,  still severe    Outpatient Medications Prior to Visit  Medication Sig Dispense Refill   acetaminophen (TYLENOL) 325 MG tablet Take 650 mg by mouth every 6 (six) hours as needed.     ADVAIR DISKUS 100-50 MCG/ACT AEPB INHALE 1 PUFF INTO THE LUNGS TWICE A DAY 60 each 3   amLODipine (NORVASC) 2.5 MG tablet Take 1 tablet (2.5 mg total) by mouth daily. 90 tablet 2   metoprolol succinate (TOPROL-XL) 25 MG 24 hr tablet TAKE 1 TABLET (25 MG TOTAL) BY MOUTH DAILY. 90 tablet 0   omeprazole (PRILOSEC OTC) 20 MG tablet Take 20 mg by mouth as needed.      telmisartan (MICARDIS) 80 MG tablet TAKE 1 TABLET BY MOUTH EVERY DAY 90 tablet 1   Calcium Carbonate-Vit D-Min (CALCIUM 1200 PO) Take 1 tablet by mouth daily. (Patient not taking: Reported on 11/07/2021)     Cholecalciferol (VITAMIN D3) 2000 UNITS TABS Take 1 tablet by mouth daily. (Patient not taking: Reported on 11/07/2021)     ibuprofen (ADVIL,MOTRIN) 400 MG tablet Take 400 mg by mouth daily as needed.  (Patient not taking: Reported on 11/07/2021)     Omega-3 Fatty Acids (FISH OIL) 1000 MG CAPS Take 1 capsule by mouth daily. (Patient not taking: Reported on 11/07/2021)      rosuvastatin (CRESTOR) 10 MG tablet Take 1 tablet (10 mg total) by mouth daily. (Patient not taking: Reported on 11/07/2021) 90 tablet 1   prednisoLONE acetate (PRED FORTE) 1 % ophthalmic suspension  (Patient not taking: Reported on 11/07/2021)     No facility-administered medications prior to visit.    Review of Systems;  Patient denies headache, fevers, malaise, unintentional weight loss, skin rash, eye pain, sinus congestion and sinus pain, sore throat, dysphagia,  hemoptysis , cough, dyspnea, wheezing, chest pain, palpitations, orthopnea, edema, abdominal pain, nausea, melena, diarrhea, constipation, flank pain, dysuria, hematuria, urinary  Frequency, nocturia, numbness, tingling, seizures,  Focal weakness, Loss of consciousness,  Tremor, insomnia, depression, anxiety, and suicidal ideation.      Objective:  BP (!) 148/74 (BP Location: Left Arm, Patient Position: Sitting, Cuff Size: Normal)   Pulse 75   Temp 98.1 F (36.7 C) (Oral)   Ht '4\' 11"'$  (1.499 m)   Wt 162 lb 6.4 oz (73.7 kg)   SpO2 98%   BMI 32.80 kg/m   BP Readings from Last 3 Encounters:  11/07/21 (!) 148/74  08/16/21 120/70  08/03/21 (!) 150/78    Wt Readings from Last 3 Encounters:  11/07/21 162 lb 6.4 oz (73.7 kg)  08/01/21 164 lb 3.2 oz (  74.5 kg)  07/04/21 162 lb 3.2 oz (73.6 kg)    General appearance: alert, cooperative and appears stated age Ears: normal TM's and external ear canals both ears Throat: lips, mucosa, and tongue normal; teeth and gums normal Neck: no adenopathy, no carotid bruit, supple, symmetrical, trachea midline and thyroid not enlarged, symmetric, no tenderness/mass/nodules Back: symmetric, no curvature. ROM normal. No CVA tenderness. Lungs: clear to auscultation bilaterally Heart: regular rate and rhythm, S1, S2 normal, no murmur, click, rub or gallop Abdomen: soft, non-tender; bowel sounds normal; no masses,  no organomegaly Pulses: 2+ and symmetric Skin: Skin color, texture, turgor  normal. No rashes or lesions Lymph nodes: Cervical, supraclavicular, and axillary nodes normal. Neuro:  awake and interactive with normal mood and affect. Higher cortical functions are normal. Speech is clear without word-finding difficulty or dysarthria. Extraocular movements are intact. Visual fields of both eyes are grossly intact. Sensation to light touch is grossly intact bilaterally of upper and lower extremities. Motor examination shows 4+/5 symmetric hand grip and upper extremity and 5/5 lower extremity strength. There is no pronation or drift. Gait is non-ataxic   Lab Results  Component Value Date   HGBA1C 6.0 07/25/2021    Lab Results  Component Value Date   CREATININE 0.68 07/25/2021   CREATININE 0.68 02/19/2021   CREATININE 0.73 08/09/2020    Lab Results  Component Value Date   WBC 8.2 07/25/2021   HGB 12.0 07/25/2021   HCT 37.6 07/25/2021   PLT 212.0 07/25/2021   GLUCOSE 89 07/25/2021   CHOL 203 (H) 08/01/2020   TRIG 58.0 08/01/2020   HDL 83.40 08/01/2020   LDLCALC 108 (H) 08/01/2020   ALT 13 07/25/2021   AST 14 07/25/2021   NA 129 (L) 07/25/2021   K 4.7 07/25/2021   CL 95 (L) 07/25/2021   CREATININE 0.68 07/25/2021   BUN 17 07/25/2021   CO2 29 07/25/2021   TSH 2.71 07/25/2021   HGBA1C 6.0 07/25/2021   MICROALBUR <0.7 07/25/2021    MM 3D SCREEN BREAST BILATERAL  Result Date: 05/17/2021 CLINICAL DATA:  Screening. EXAM: DIGITAL SCREENING BILATERAL MAMMOGRAM WITH TOMOSYNTHESIS AND CAD TECHNIQUE: Bilateral screening digital craniocaudal and mediolateral oblique mammograms were obtained. Bilateral screening digital breast tomosynthesis was performed. The images were evaluated with computer-aided detection. COMPARISON:  Previous exam(s). ACR Breast Density Category c: The breast tissue is heterogeneously dense, which may obscure small masses. FINDINGS: There are no findings suspicious for malignancy. IMPRESSION: No mammographic evidence of malignancy. A result letter  of this screening mammogram will be mailed directly to the patient. RECOMMENDATION: Screening mammogram in one year. (Code:SM-B-01Y) BI-RADS CATEGORY  1: Negative. Electronically Signed   By: Claudie Revering M.D.   On: 05/17/2021 08:41    Assessment & Plan:   Problem List Items Addressed This Visit   None   I spent a total of   minutes with this patient in a face to face visit on the date of this encounter reviewing the last office visit with me in       ,  most recent visit with cardiology ,    ,  patient's diet and exercise habits, home blood pressure /blod sugar readings, recent ER visit including labs and imaging studies ,   and post visit ordering of testing and therapeutics.    Follow-up: No follow-ups on file.   Crecencio Mc, MD

## 2021-11-07 NOTE — Patient Instructions (Addendum)
  I want you to retry Crestor (rosuvastatin) to prevent  heart attacks and strokes You can take it In the morning with your other medications     I am changing your metoprolol to carvedilol for better blood pressure control. STOP THE METOPROLOL WHEN YOU START THE CARVEDILOL  Take  carvedilol 2 times daily :  once in the morning,    and the second dose in the evening with your tylenol dose   CONTINUE AMLODIPINE AND TELMISARTAN   RETURN IN 3 WEEKS FOR A BLOOD PRESSURE CHECK AND FOR  BLOOD TEST

## 2021-11-08 NOTE — Assessment & Plan Note (Signed)
Advised again of the need to  Reduce riskofo CVA with high potency statin . Refilling rosuvastin at every other day

## 2021-11-08 NOTE — Assessment & Plan Note (Signed)
Not at goal.  Changing metoprolol to carvedilol 6.25 mg bid

## 2021-11-13 DIAGNOSIS — M47816 Spondylosis without myelopathy or radiculopathy, lumbar region: Secondary | ICD-10-CM | POA: Diagnosis not present

## 2021-11-14 ENCOUNTER — Telehealth: Payer: Self-pay | Admitting: Internal Medicine

## 2021-11-14 NOTE — Telephone Encounter (Signed)
Pt called stating she would like to know what vitamins she should be taking and what the provider recommend

## 2021-11-15 NOTE — Telephone Encounter (Signed)
Pt is aware and gave a verbal understanding.  

## 2021-11-15 NOTE — Telephone Encounter (Signed)
Is this something that you would like to discuss with pt in an office visit?

## 2021-11-20 DIAGNOSIS — H524 Presbyopia: Secondary | ICD-10-CM | POA: Diagnosis not present

## 2021-11-20 DIAGNOSIS — Z961 Presence of intraocular lens: Secondary | ICD-10-CM | POA: Diagnosis not present

## 2021-11-24 ENCOUNTER — Other Ambulatory Visit: Payer: Self-pay | Admitting: Internal Medicine

## 2021-11-28 ENCOUNTER — Encounter: Payer: Self-pay | Admitting: Internal Medicine

## 2021-11-28 ENCOUNTER — Other Ambulatory Visit: Payer: Medicare HMO

## 2021-11-28 ENCOUNTER — Ambulatory Visit (INDEPENDENT_AMBULATORY_CARE_PROVIDER_SITE_OTHER): Payer: Medicare HMO

## 2021-11-28 ENCOUNTER — Ambulatory Visit (INDEPENDENT_AMBULATORY_CARE_PROVIDER_SITE_OTHER): Payer: Medicare HMO | Admitting: Internal Medicine

## 2021-11-28 ENCOUNTER — Ambulatory Visit: Payer: Medicare HMO

## 2021-11-28 VITALS — BP 146/74 | HR 75 | Temp 98.1°F | Ht 59.0 in | Wt 162.8 lb

## 2021-11-28 DIAGNOSIS — R0602 Shortness of breath: Secondary | ICD-10-CM

## 2021-11-28 DIAGNOSIS — I10 Essential (primary) hypertension: Secondary | ICD-10-CM

## 2021-11-28 DIAGNOSIS — R1013 Epigastric pain: Secondary | ICD-10-CM | POA: Insufficient documentation

## 2021-11-28 DIAGNOSIS — J4530 Mild persistent asthma, uncomplicated: Secondary | ICD-10-CM

## 2021-11-28 DIAGNOSIS — R1010 Upper abdominal pain, unspecified: Secondary | ICD-10-CM | POA: Diagnosis not present

## 2021-11-28 DIAGNOSIS — E7800 Pure hypercholesterolemia, unspecified: Secondary | ICD-10-CM

## 2021-11-28 DIAGNOSIS — R059 Cough, unspecified: Secondary | ICD-10-CM | POA: Diagnosis not present

## 2021-11-28 MED ORDER — FLUTICASONE-SALMETEROL 250-50 MCG/ACT IN AEPB: 1.0000 | INHALATION_SPRAY | Freq: Two times a day (BID) | RESPIRATORY_TRACT | 2 refills | Status: DC

## 2021-11-28 MED ORDER — METOPROLOL SUCCINATE ER 25 MG PO TB24
25.0000 mg | ORAL_TABLET | Freq: Every day | ORAL | 1 refills | Status: DC
Start: 2021-11-28 — End: 2022-06-05

## 2021-11-28 NOTE — Assessment & Plan Note (Addendum)
Likely due to deconditioning given  nomal lung examn and normal ECHO done in February .  Checking  Chest x ray as she has not had one in over 10 years

## 2021-11-28 NOTE — Patient Instructions (Addendum)
Ok to take docusate nightly for constipation  it is a stool softener.. 100 or 200 mg daily    Stop the carvedilol.    Restart  the metoprolol AT NIGHT  and continue taking amlodipine and telmisartan in the MORNING"   I have refilled the Advair at the higher dose.  I am also scheduling you for pulmonary function tests"  to determine if you have ASTHMA   Please start the Crestor and return for blood work in 3 weeks

## 2021-11-28 NOTE — Assessment & Plan Note (Signed)
She is not tolerating change from metoprolol to carvedilol due to light headedness,  And BP is actually higher today than last time.  Resuming metoprolol.

## 2021-11-28 NOTE — Assessment & Plan Note (Addendum)
The history of her diagnosis is unclear and over 81 years old and based on one presentation after a traumatic event.  It does not appear that she ever had PFTs. csr and PFTs have been Ordered today . She is more short of breath since the dose of advair was reduced..  Increased today

## 2021-11-28 NOTE — Progress Notes (Signed)
Subjective:  Patient ID: Lacey Brown, female    DOB: 08-Feb-1940  Age: 81 y.o. MRN: [id number]  CC: The primary encounter diagnosis was Shortness of breath. Diagnoses of Pure hypercholesterolemia, Primary hypertension, Epigastric pain, and Mild persistent asthma without complication were also pertinent to this visit.   HPI Lacey Brown presents for follow up on hypertension and other issues  Chief Complaint  Patient presents with   Follow-up    Follow up on hypertension    81 yr old female seen 2 weeks ago for hypertension management.  Medication changes were made as she was not at goal on metoprolol , amlodipine and telmisartan. We changed  metoprolol to carvedilol 6.25 mg bid .  She has been taking the medications as directed,  :  taking telmisartan and carvedilol  and amlodipine  ,  but reports new onset light headedness not with position change . Occurred this morning while getting dressed,  and describing a feeling of pressure in her head   from the occiput and in her sinuses.  She takes tylenol daily to manage her back pain.  She does not check her blood pressure at home because she has trouble using the automatic machine.   Aortic atherosclerosis: She was also advised to resume Crestor due to  aortic atherosclerosis but has not done so    Patient reports "terrible stomach cramps" this morning when she woke up. . The cramps have improved somewhat since this morning,  but still present , and with some gas . Reviewed her diet from yesterday, ate turnip greens,  black eyed peas and chicken biscuit all home cooked.   PATIENT ALSO REPORTS SHORTNESS OF BREATH WITH FAST WALKING,  CHANGING CLOTHES.  HAS CHRONIC BACK Pain that is brought on by prolonged standing .  She states that she was diagnosed with asthma 30 years ago during an office visit for dyspnea.  She has no history of evaluation with PFTS but has been using Advair for years.     Outpatient Medications Prior to  Visit  Medication Sig Dispense Refill   acetaminophen (TYLENOL) 325 MG tablet Take 650 mg by mouth every 6 (six) hours as needed.     ADVAIR DISKUS 100-50 MCG/ACT AEPB INHALE 1 PUFF INTO THE LUNGS TWICE A DAY 60 each 3   amLODipine (NORVASC) 2.5 MG tablet Take 1 tablet (2.5 mg total) by mouth daily. 90 tablet 2   Calcium Carbonate-Vit D-Min (CALCIUM 1200 PO) Take 1 tablet by mouth daily.     Cholecalciferol (VITAMIN D3) 2000 UNITS TABS Take 1 tablet by mouth daily.     omeprazole (PRILOSEC OTC) 20 MG tablet Take 20 mg by mouth as needed.      rosuvastatin (CRESTOR) 10 MG tablet Take 1 tablet (10 mg total) by mouth daily. 90 tablet 1   telmisartan (MICARDIS) 80 MG tablet TAKE 1 TABLET BY MOUTH EVERY DAY 90 tablet 1   carvedilol (COREG) 6.25 MG tablet Take 1 tablet (6.25 mg total) by mouth 2 (two) times daily. 60 tablet 3   ibuprofen (ADVIL,MOTRIN) 400 MG tablet Take 400 mg by mouth daily as needed.  (Patient not taking: Reported on 11/07/2021)     Omega-3 Fatty Acids (FISH OIL) 1000 MG CAPS Take 1 capsule by mouth daily. (Patient not taking: Reported on 11/07/2021)     No facility-administered medications prior to visit.    Review of Systems;  Patient denies headache, fevers, malaise, unintentional weight loss, skin rash, eye pain, sinus congestion  and sinus pain, sore throat, dysphagia,  hemoptysis , cough, dyspnea, wheezing, chest pain, palpitations, orthopnea, edema, abdominal pain, nausea, melena, diarrhea, constipation, flank pain, dysuria, hematuria, urinary  Frequency, nocturia, numbness, tingling, seizures,  Focal weakness, Loss of consciousness,  Tremor, insomnia, depression, anxiety, and suicidal ideation.      Objective:  BP (!) 146/74 (BP Location: Left Arm, Patient Position: Sitting, Cuff Size: Normal)   Pulse 75   Temp 98.1 F (36.7 C) (Oral)   Ht '4\' 11"'$  (1.499 m)   Wt 162 lb 12.8 oz (73.8 kg)   SpO2 97%   BMI 32.88 kg/m   BP Readings from Last 3 Encounters:  11/28/21  (!) 146/74  11/07/21 (!) 140/74  08/16/21 120/70    Wt Readings from Last 3 Encounters:  11/28/21 162 lb 12.8 oz (73.8 kg)  11/07/21 162 lb 6.4 oz (73.7 kg)  08/01/21 164 lb 3.2 oz (74.5 kg)    General appearance: alert, cooperative and appears stated age Ears: normal TM's and external ear canals both ears Throat: lips, mucosa, and tongue normal; teeth and gums normal Neck: no adenopathy, no carotid bruit, supple, symmetrical, trachea midline and thyroid not enlarged, symmetric, no tenderness/mass/nodules Back: symmetric, no curvature. ROM normal. No CVA tenderness. Lungs: clear to auscultation bilaterally Heart: regular rate and rhythm, S1, S2 normal, no murmur, click, rub or gallop Abdomen: soft, non-tender; bowel sounds normal; no masses,  no organomegaly Pulses: 2+ and symmetric Skin: Skin color, texture, turgor normal. No rashes or lesions Lymph nodes: Cervical, supraclavicular, and axillary nodes normal. Neuro:  awake and interactive with normal mood and affect. Higher cortical functions are normal. Speech is clear without word-finding difficulty or dysarthria. Extraocular movements are intact. Visual fields of both eyes are grossly intact. Sensation to light touch is grossly intact bilaterally of upper and lower extremities. Motor examination shows 4+/5 symmetric hand grip and upper extremity and 5/5 lower extremity strength. There is no pronation or drift. Gait is non-ataxic   Lab Results  Component Value Date   HGBA1C 6.0 07/25/2021    Lab Results  Component Value Date   CREATININE 0.68 07/25/2021   CREATININE 0.68 02/19/2021   CREATININE 0.73 08/09/2020    Lab Results  Component Value Date   WBC 8.2 07/25/2021   HGB 12.0 07/25/2021   HCT 37.6 07/25/2021   PLT 212.0 07/25/2021   GLUCOSE 89 07/25/2021   CHOL 203 (H) 08/01/2020   TRIG 58.0 08/01/2020   HDL 83.40 08/01/2020   LDLCALC 108 (H) 08/01/2020   ALT 13 07/25/2021   AST 14 07/25/2021   NA 129 (L)  07/25/2021   K 4.7 07/25/2021   CL 95 (L) 07/25/2021   CREATININE 0.68 07/25/2021   BUN 17 07/25/2021   CO2 29 07/25/2021   TSH 2.71 07/25/2021   HGBA1C 6.0 07/25/2021   MICROALBUR <0.7 07/25/2021    MM 3D SCREEN BREAST BILATERAL  Result Date: 05/17/2021 CLINICAL DATA:  Screening. EXAM: DIGITAL SCREENING BILATERAL MAMMOGRAM WITH TOMOSYNTHESIS AND CAD TECHNIQUE: Bilateral screening digital craniocaudal and mediolateral oblique mammograms were obtained. Bilateral screening digital breast tomosynthesis was performed. The images were evaluated with computer-aided detection. COMPARISON:  Previous exam(s). ACR Breast Density Category c: The breast tissue is heterogeneously dense, which may obscure small masses. FINDINGS: There are no findings suspicious for malignancy. IMPRESSION: No mammographic evidence of malignancy. A result letter of this screening mammogram will be mailed directly to the patient. RECOMMENDATION: Screening mammogram in one year. (Code:SM-B-01Y) BI-RADS CATEGORY  1: Negative. Electronically  Signed   By: Claudie Revering M.D.   On: 05/17/2021 08:41    Assessment & Plan:   Problem List Items Addressed This Visit     HTN (hypertension)    She is not tolerating change from metoprolol to carvedilol due to light headedness,  And BP is actually higher today than last time.  Resuming metoprolol.        Relevant Medications   metoprolol succinate (TOPROL-XL) 25 MG 24 hr tablet   Asthma    The history of her diagnosis is unclear and over 57 years old and based on one presentation after a traumatic event.  It does not appear that she ever had PFTs. csr and PFTs have been Ordered today . She is more short of breath since the dose of advair was reduced..  Increased today       Relevant Medications   fluticasone-salmeterol (ADVAIR DISKUS) 250-50 MCG/ACT AEPB   Epigastric pain    She has normal BS no pain in epigastric region,  But mild discomfort in RUQ .  Plain abd film today.  If pain  persists will obtain ultrsound       Relevant Orders   DG Abd 1 View   Shortness of breath - Primary    Likely due to deconditioning given  nomal lung examn and normal ECHO done in February .  Checking  Chest x ray as she has not had one in over 10 years       Relevant Orders   DG Chest 2 View   Pure hypercholesterolemia   Relevant Medications   metoprolol succinate (TOPROL-XL) 25 MG 24 hr tablet    I spent a total of 43 minutes with this patient in a face to face visit on the date of this encounter reviewing the last office visit with me in  early October,  her most recent visit with cardiology ,  last ECHO,   patient's activity level , and post visit ordering of testing and therapeutics.    Follow-up: No follow-ups on file.   Crecencio Mc, MD

## 2021-11-28 NOTE — Assessment & Plan Note (Signed)
She has normal BS no pain in epigastric region,  But mild discomfort in RUQ .  Plain abd film today.  If pain persists will obtain ultrsound

## 2021-11-29 ENCOUNTER — Telehealth: Payer: Self-pay | Admitting: Internal Medicine

## 2021-11-29 NOTE — Telephone Encounter (Signed)
Spoke with pt to let her know that we have not received the results yet but as soon as we do we will give her a call.

## 2021-11-29 NOTE — Telephone Encounter (Signed)
Pt called wanting to know her xray results from yesterday

## 2021-12-03 NOTE — Telephone Encounter (Signed)
Pt stated that she is not having the pain anymore. She stated that she is still having some constipation. Pt sated that she takes a stool softener as needed, but there is days that she will go two or three days without a bowel movement. Pt would like to know if you recommend something else for her to take.

## 2021-12-03 NOTE — Telephone Encounter (Signed)
Spoke with pt and informed her of the message below and the medications that Dr. Derrel Nip recommends for the constipation.

## 2021-12-04 DIAGNOSIS — M47816 Spondylosis without myelopathy or radiculopathy, lumbar region: Secondary | ICD-10-CM | POA: Diagnosis not present

## 2021-12-06 ENCOUNTER — Ambulatory Visit: Payer: Medicare HMO

## 2021-12-10 ENCOUNTER — Ambulatory Visit (INDEPENDENT_AMBULATORY_CARE_PROVIDER_SITE_OTHER): Payer: Medicare HMO

## 2021-12-10 VITALS — Ht 59.0 in | Wt 162.0 lb

## 2021-12-10 DIAGNOSIS — Z0000 Encounter for general adult medical examination without abnormal findings: Secondary | ICD-10-CM

## 2021-12-10 NOTE — Patient Instructions (Addendum)
Lacey Brown , Thank you for taking time to come for your Medicare Wellness Visit. I appreciate your ongoing commitment to your health goals. Please review the following plan we discussed and let me know if I can assist you in the future.   These are the goals we discussed:  Goals      Maintain healthy lifestyle     Stay active  Healthy diet        This is a list of the screening recommended for you and due dates:  Health Maintenance  Topic Date Due   COVID-19 Vaccine (5 - Pfizer series) 12/14/2021*   Tetanus Vaccine  01/04/2022*   Medicare Annual Wellness Visit  12/11/2022   Pneumonia Vaccine  Completed   Flu Shot  Completed   DEXA scan (bone density measurement)  Completed   Zoster (Shingles) Vaccine  Completed   HPV Vaccine  Aged Out  *Topic was postponed. The date shown is not the original due date.    Advanced directives: End of life planning; Advanced aging; Advanced directives discussed.  No HCPOA/Living Will.  Additional information in office as requested. Declined at this time.  Conditions/risks identified: none new.  Next appointment: Follow up in one year for your annual wellness visit    Preventive Care 65 Years and Older, Female Preventive care refers to lifestyle choices and visits with your health care provider that can promote health and wellness. What does preventive care include? A yearly physical exam. This is also called an annual well check. Dental exams once or twice a year. Routine eye exams. Ask your health care provider how often you should have your eyes checked. Personal lifestyle choices, including: Daily care of your teeth and gums. Regular physical activity. Eating a healthy diet. Avoiding tobacco and drug use. Limiting alcohol use. Practicing safe sex. Taking low-dose aspirin every day. Taking vitamin and mineral supplements as recommended by your health care provider. What happens during an annual well check? The services and  screenings done by your health care provider during your annual well check will depend on your age, overall health, lifestyle risk factors, and family history of disease. Counseling  Your health care provider may ask you questions about your: Alcohol use. Tobacco use. Drug use. Emotional well-being. Home and relationship well-being. Sexual activity. Eating habits. History of falls. Memory and ability to understand (cognition). Work and work Statistician. Reproductive health. Screening  You may have the following tests or measurements: Height, weight, and BMI. Blood pressure. Lipid and cholesterol levels. These may be checked every 5 years, or more frequently if you are over 53 years old. Skin check. Lung cancer screening. You may have this screening every year starting at age 54 if you have a 30-pack-year history of smoking and currently smoke or have quit within the past 15 years. Fecal occult blood test (FOBT) of the stool. You may have this test every year starting at age 52. Flexible sigmoidoscopy or colonoscopy. You may have a sigmoidoscopy every 5 years or a colonoscopy every 10 years starting at age 37. Hepatitis C blood test. Hepatitis B blood test. Sexually transmitted disease (STD) testing. Diabetes screening. This is done by checking your blood sugar (glucose) after you have not eaten for a while (fasting). You may have this done every 1-3 years. Bone density scan. This is done to screen for osteoporosis. You may have this done starting at age 83. Mammogram. This may be done every 1-2 years. Talk to your health care provider about how  often you should have regular mammograms. Talk with your health care provider about your test results, treatment options, and if necessary, the need for more tests. Vaccines  Your health care provider may recommend certain vaccines, such as: Influenza vaccine. This is recommended every year. Tetanus, diphtheria, and acellular pertussis (Tdap,  Td) vaccine. You may need a Td booster every 10 years. Zoster vaccine. You may need this after age 73. Pneumococcal 13-valent conjugate (PCV13) vaccine. One dose is recommended after age 75. Pneumococcal polysaccharide (PPSV23) vaccine. One dose is recommended after age 82. Talk to your health care provider about which screenings and vaccines you need and how often you need them. This information is not intended to replace advice given to you by your health care provider. Make sure you discuss any questions you have with your health care provider. Document Released: 02/17/2015 Document Revised: 10/11/2015 Document Reviewed: 11/22/2014 Elsevier Interactive Patient Education  2017 [place] Prevention in the Home Falls can cause injuries. They can happen to people of all ages. There are many things you can do to make your home safe and to help prevent falls. What can I do on the outside of my home? Regularly fix the edges of walkways and driveways and fix any cracks. Remove anything that might make you trip as you walk through a door, such as a raised step or threshold. Trim any bushes or trees on the path to your home. Use bright outdoor lighting. Clear any walking paths of anything that might make someone trip, such as rocks or tools. Regularly check to see if handrails are loose or broken. Make sure that both sides of any steps have handrails. Any raised decks and porches should have guardrails on the edges. Have any leaves, snow, or ice cleared regularly. Use sand or salt on walking paths during winter. Clean up any spills in your garage right away. This includes oil or grease spills. What can I do in the bathroom? Use night lights. Install grab bars by the toilet and in the tub and shower. Do not use towel bars as grab bars. Use non-skid mats or decals in the tub or shower. If you need to sit down in the shower, use a plastic, non-slip stool. Keep the floor dry. Clean up any  water that spills on the floor as soon as it happens. Remove soap buildup in the tub or shower regularly. Attach bath mats securely with double-sided non-slip rug tape. Do not have throw rugs and other things on the floor that can make you trip. What can I do in the bedroom? Use night lights. Make sure that you have a light by your bed that is easy to reach. Do not use any sheets or blankets that are too big for your bed. They should not hang down onto the floor. Have a firm chair that has side arms. You can use this for support while you get dressed. Do not have throw rugs and other things on the floor that can make you trip. What can I do in the kitchen? Clean up any spills right away. Avoid walking on wet floors. Keep items that you use a lot in easy-to-reach places. If you need to reach something above you, use a strong step stool that has a grab bar. Keep electrical cords out of the way. Do not use floor polish or wax that makes floors slippery. If you must use wax, use non-skid floor wax. Do not have throw rugs and other things  on the floor that can make you trip. What can I do with my stairs? Do not leave any items on the stairs. Make sure that there are handrails on both sides of the stairs and use them. Fix handrails that are broken or loose. Make sure that handrails are as long as the stairways. Check any carpeting to make sure that it is firmly attached to the stairs. Fix any carpet that is loose or worn. Avoid having throw rugs at the top or bottom of the stairs. If you do have throw rugs, attach them to the floor with carpet tape. Make sure that you have a light switch at the top of the stairs and the bottom of the stairs. If you do not have them, ask someone to add them for you. What else can I do to help prevent falls? Wear shoes that: Do not have high heels. Have rubber bottoms. Are comfortable and fit you well. Are closed at the toe. Do not wear sandals. If you use a  stepladder: Make sure that it is fully opened. Do not climb a closed stepladder. Make sure that both sides of the stepladder are locked into place. Ask someone to hold it for you, if possible. Clearly mark and make sure that you can see: Any grab bars or handrails. First and last steps. Where the edge of each step is. Use tools that help you move around (mobility aids) if they are needed. These include: Canes. Walkers. Scooters. Crutches. Turn on the lights when you go into a dark area. Replace any light bulbs as soon as they burn out. Set up your furniture so you have a clear path. Avoid moving your furniture around. If any of your floors are uneven, fix them. If there are any pets around you, be aware of where they are. Review your medicines with your doctor. Some medicines can make you feel dizzy. This can increase your chance of falling. Ask your doctor what other things that you can do to help prevent falls. This information is not intended to replace advice given to you by your health care provider. Make sure you discuss any questions you have with your health care provider. Document Released: 11/17/2008 Document Revised: 06/29/2015 Document Reviewed: 02/25/2014 Elsevier Interactive Patient Education  2017 [employer].

## 2021-12-10 NOTE — Progress Notes (Addendum)
Subjective:   Lacey Brown is a 81 y.o. female who presents for Medicare Annual (Subsequent) preventive examination.  Review of Systems    No ROS.  Medicare Wellness Virtual Visit.  Visual/audio telehealth visit, UTA vital signs.   See social history for additional risk factors.   Cardiac Risk Factors include: advanced age (>47mn, >>26women);hypertension     Objective:    Today's Vitals   12/10/21 1321  Weight: 162 lb (73.5 kg)  Height: '4\' 11"'$  (1.499 m)   Body mass index is 32.72 kg/m.     12/10/2021    1:18 PM 12/05/2020    2:17 PM 11/12/2020    3:12 AM 12/09/2016   11:13 AM  Advanced Directives  Does Patient Have a Medical Advance Directive? No No No No  Would patient like information on creating a medical advance directive? No - Patient declined No - Patient declined No - Patient declined No - Patient declined    Current Medications (verified) Outpatient Encounter Medications as of 12/10/2021  Medication Sig   acetaminophen (TYLENOL) 325 MG tablet Take 650 mg by mouth every 6 (six) hours as needed.   ADVAIR DISKUS 100-50 MCG/ACT AEPB INHALE 1 PUFF INTO THE LUNGS TWICE A DAY   amLODipine (NORVASC) 2.5 MG tablet Take 1 tablet (2.5 mg total) by mouth daily.   Calcium Carbonate-Vit D-Min (CALCIUM 1200 PO) Take 1 tablet by mouth daily.   Cholecalciferol (VITAMIN D3) 2000 UNITS TABS Take 1 tablet by mouth daily.   fluticasone-salmeterol (ADVAIR DISKUS) 250-50 MCG/ACT AEPB Inhale 1 puff into the lungs in the morning and at bedtime.   metoprolol succinate (TOPROL-XL) 25 MG 24 hr tablet Take 1 tablet (25 mg total) by mouth daily.   omeprazole (PRILOSEC OTC) 20 MG tablet Take 20 mg by mouth as needed.    rosuvastatin (CRESTOR) 10 MG tablet Take 1 tablet (10 mg total) by mouth daily.   telmisartan (MICARDIS) 80 MG tablet TAKE 1 TABLET BY MOUTH EVERY DAY   No facility-administered encounter medications on file as of 12/10/2021.    Allergies (verified) Ace inhibitors,  Codeine, and Kenalog [triamcinolone acetonide]   History: Past Medical History:  Diagnosis Date   Arthritis    back, neck; right shoulder;    Asthma    using advair prn   Chicken pox    Hyperlipidemia    Hypertension    controlled with medication;    Past Surgical History:  Procedure Laterality Date   BREAST EXCISIONAL BIOPSY Left    TONSILLECTOMY AND ADENOIDECTOMY     Family History  Problem Relation Age of Onset   Heart disease Mother    Heart disease Father    Arthritis Maternal Grandmother    Diabetes Neg Hx    Cancer Neg Hx    Stroke Neg Hx    Social History   Socioeconomic History   Marital status: Widowed    Spouse name: Not on file   Number of children: Not on file   Years of education: Not on file   Highest education level: Not on file  Occupational History   Not on file  Tobacco Use   Smoking status: Former    Types: Cigarettes    Quit date: 04/17/2009    Years since quitting: 12.6   Smokeless tobacco: Never  Vaping Use   Vaping Use: Never used  Substance and Sexual Activity   Alcohol use: Yes    Alcohol/week: 0.0 standard drinks of alcohol    Comment: occasional  Drug use: No   Sexual activity: Never  Other Topics Concern   Not on file  Social History Narrative   Not on file   Social Determinants of Health   Financial Resource Strain: Low Risk  (12/10/2021)   Overall Financial Resource Strain (CARDIA)    Difficulty of Paying Living Expenses: Not hard at all  Food Insecurity: No Food Insecurity (12/10/2021)   Hunger Vital Sign    Worried About Running Out of Food in the Last Year: Never true    Ran Out of Food in the Last Year: Never true  Transportation Needs: No Transportation Needs (12/10/2021)   PRAPARE - Hydrologist (Medical): No    Lack of Transportation (Non-Medical): No  Physical Activity: Inactive (12/05/2020)   Exercise Vital Sign    Days of Exercise per Week: 0 days    Minutes of Exercise per  Session: 0 min  Stress: No Stress Concern Present (12/10/2021)   [place]    Feeling of Stress : Not at all  Social Connections: Moderately Integrated (12/10/2021)   Social Connection and Isolation Panel [NHANES]    Frequency of Communication with Friends and Family: More than three times a week    Frequency of Social Gatherings with Friends and Family: More than three times a week    Attends Religious Services: More than 4 times per year    Active Member of [employer] or Organizations: Yes    Attends Archivist Meetings: 1 to 4 times per year    Marital Status: Widowed    Tobacco Counseling Counseling given: Not Answered   Clinical Intake:  Pre-visit preparation completed: Yes        Diabetes: No  How often do you need to have someone help you when you read instructions, pamphlets, or other written materials from your doctor or pharmacy?: 1 - Never   Interpreter Needed?: No      Activities of Daily Living    12/10/2021    1:19 PM  In your present state of health, do you have any difficulty performing the following activities:  Hearing? 0  Vision? 0  Difficulty concentrating or making decisions? 0  Walking or climbing stairs? 0  Dressing or bathing? 0  Doing errands, shopping? 0  Preparing Food and eating ? N  Using the Toilet? N  In the past six months, have you accidently leaked urine? N  Do you have problems with loss of bowel control? N  Managing your Medications? N  Managing your Finances? N  Housekeeping or managing your Housekeeping? N    Patient Care Team: Crecencio Mc, MD as PCP - General (Internal Medicine) Jerline Pain, MD as PCP - Cardiology (Cardiology)  Indicate any recent Medical Services you may have received from other than [facility] providers in the past year (date may be approximate).     Assessment:   This is a routine wellness examination for Lacey Brown.  I connected  with  Lacey Brown on 12/10/21 by a audio enabled telemedicine application and verified that I am speaking with the correct person using two identifiers.  Patient Location: Home  Provider Location: Office/Clinic  I discussed the limitations of evaluation and management by telemedicine. The patient expressed understanding and agreed to proceed.   Hearing/Vision screen Hearing Screening - Comments:: No difficulty hearing. Vision Screening - Comments:: Followed by Dr. Gloriann Loan Cataract extraction, bilateral They have seen their ophthalmologist in the  last 12 months.    Dietary issues and exercise activities discussed: Current Exercise Habits: Home exercise routine, Intensity: Mild   Goals Addressed             This Visit's Progress    Maintain healthy lifestyle       Stay active  Healthy diet       Depression Screen    12/10/2021    1:05 PM 11/28/2021    9:49 AM 11/07/2021    1:26 PM 08/01/2021    2:00 PM 07/04/2021    2:45 PM 12/05/2020    2:09 PM 10/31/2020    1:25 PM  PHQ 2/9 Scores  PHQ - 2 Score 0 0 0 0 0 0 0    Fall Risk    12/10/2021    1:19 PM 11/28/2021    9:49 AM 11/07/2021    1:26 PM 08/01/2021    2:00 PM 07/04/2021    2:45 PM  [place] in the past year? 0  0 0 0  Number falls in past yr: 0      Injury with Fall? 0      Risk for fall due to : No Fall Risks No Fall Risks No Fall Risks No Fall Risks No Fall Risks  Follow up Falls evaluation completed;Falls prevention discussed Falls evaluation completed Falls evaluation completed Falls evaluation completed Falls evaluation completed    FALL RISK PREVENTION PERTAINING TO THE HOME: Home free of loose throw rugs in walkways, pet beds, electrical cords, etc? Yes  Adequate lighting in your home to reduce risk of falls? Yes   ASSISTIVE DEVICES UTILIZED TO PREVENT FALLS: Life alert? No  Use of a cane, walker or w/c? Yes , as needed Grab bars in the bathroom? Yes  Shower chair or bench in shower?  No  Comfort chair height toilet? No   TIMED UP AND GO: Was the test performed? No .   Cognitive Function:  Patient is alert and oriented x3.       12/10/2021    1:20 PM 12/05/2020    2:15 PM  6CIT Screen  What Year? 0 points 0 points  What month? 0 points 0 points  What time? 0 points 0 points  Count back from 20 0 points 0 points  Months in reverse 0 points 0 points  Repeat phrase 0 points 0 points  Total Score 0 points 0 points    Immunizations Immunization History  Administered Date(s) Administered   Influenza, High Dose Seasonal PF 11/06/2014, 11/06/2017, 10/13/2018, 10/07/2019   Influenza,inj,Quad PF,6+ Mos 11/11/2013   Influenza-Unspecified 10/31/2011, 11/18/2012, 11/11/2013, 11/10/2014, 11/30/2015, 10/06/2020, 10/19/2021   PFIZER(Purple Top)SARS-COV-2 Vaccination 02/24/2019, 03/22/2019, 11/16/2019   Pfizer Covid-19 Vaccine Bivalent Booster 60yr & up 01/04/2021   Pneumococcal Conjugate-13 06/08/2014   Pneumococcal Polysaccharide-23 10/12/2008   Zoster Recombinat (Shingrix) 10/28/2018, 12/28/2018   Zoster, Live 11/29/2009   TDAP status: Due, Education has been provided regarding the importance of this vaccine. Advised may receive this vaccine at local pharmacy or Health Dept. Aware to provide a copy of the vaccination record if obtained from local pharmacy or Health Dept. Verbalized acceptance and understanding.  Screening Tests Health Maintenance  Topic Date Due   COVID-19 Vaccine (5 - Pfizer series) 12/14/2021 (Originally 05/05/2021)   TETANUS/TDAP  01/04/2022 (Originally 03/23/1959)   Medicare Annual Wellness (AWV)  12/11/2022   Pneumonia Vaccine 81 Years old  Completed   INFLUENZA VACCINE  Completed   DEXA SCAN  Completed   Zoster  Vaccines- Shingrix  Completed   HPV VACCINES  Aged Out    Health Maintenance There are no preventive care reminders to display for this patient.  Lung Cancer Screening: (Low Dose CT Chest recommended if Age 108-80 years, 30  pack-year currently smoking OR have quit w/in 15years.) does not qualify.   Hepatitis C Screening: does not qualify.  Vision Screening: Recommended annual ophthalmology exams for early detection of glaucoma and other disorders of the eye.  Dental Screening: Recommended annual dental exams for proper oral hygiene  Community Resource Referral / Chronic Care Management: CRR required this visit?  No   CCM required this visit?  No      Plan:     I have personally reviewed and noted the following in the patient's chart:   Medical and social history Use of alcohol, tobacco or illicit drugs  Current medications and supplements including opioid prescriptions. Patient is not currently taking opioid prescriptions. Functional ability and status Nutritional status Physical activity Advanced directives List of other physicians Hospitalizations, surgeries, and ER visits in previous 12 months Vitals Screenings to include cognitive, depression, and falls Referrals and appointments  In addition, I have reviewed and discussed with patient certain preventive protocols, quality metrics, and best practice recommendations. A written personalized care plan for preventive services as well as general preventive health recommendations were provided to patient.     [place], LPN   [id number]     I have reviewed the above information and agree with above.   Deborra Medina, MD

## 2021-12-13 ENCOUNTER — Telehealth: Payer: Self-pay | Admitting: Internal Medicine

## 2021-12-13 DIAGNOSIS — E7800 Pure hypercholesterolemia, unspecified: Secondary | ICD-10-CM

## 2021-12-13 DIAGNOSIS — I10 Essential (primary) hypertension: Secondary | ICD-10-CM

## 2021-12-13 NOTE — Addendum Note (Signed)
Addended by: Adair Laundry on: 12/13/2021 03:31 PM   Modules accepted: Orders

## 2021-12-13 NOTE — Telephone Encounter (Signed)
Patient has a lab appt 12/19/2021, there are no orders in.

## 2021-12-13 NOTE — Telephone Encounter (Signed)
Labs have been ordered

## 2021-12-19 ENCOUNTER — Other Ambulatory Visit (INDEPENDENT_AMBULATORY_CARE_PROVIDER_SITE_OTHER): Payer: Medicare HMO

## 2021-12-19 DIAGNOSIS — E7800 Pure hypercholesterolemia, unspecified: Secondary | ICD-10-CM | POA: Diagnosis not present

## 2021-12-19 DIAGNOSIS — I10 Essential (primary) hypertension: Secondary | ICD-10-CM | POA: Diagnosis not present

## 2021-12-19 LAB — LIPID PANEL
Cholesterol: 146 mg/dL (ref 0–200)
HDL: 71.4 mg/dL (ref >39.00)
LDL Cholesterol: 60 mg/dL (ref 0–99)
NonHDL: 74.35
Total CHOL/HDL Ratio: 2
Triglycerides: 71 mg/dL (ref 0.0–149.0)
VLDL: 14.2 mg/dL (ref 0.0–40.0)

## 2021-12-19 LAB — COMPREHENSIVE METABOLIC PANEL
ALT: 14 U/L (ref 0–35)
AST: 16 U/L (ref 0–37)
Albumin: 4.2 g/dL (ref 3.5–5.2)
Alkaline Phosphatase: 39 U/L (ref 39–117)
BUN: 18 mg/dL (ref 6–23)
CO2: 30 mEq/L (ref 19–32)
Calcium: 9.1 mg/dL (ref 8.4–10.5)
Chloride: 96 mEq/L (ref 96–112)
Creatinine, Ser: 0.7 mg/dL (ref 0.40–1.20)
GFR: 80.36 mL/min (ref >60.00)
Glucose, Bld: 92 mg/dL (ref 70–99)
Potassium: 4.4 mEq/L (ref 3.5–5.1)
Sodium: 129 mEq/L — ABNORMAL LOW (ref 135–145)
Total Bilirubin: 0.4 mg/dL (ref 0.2–1.2)
Total Protein: 6.4 g/dL (ref 6.0–8.3)

## 2021-12-19 LAB — LDL CHOLESTEROL, DIRECT: Direct LDL: 54 mg/dL

## 2021-12-24 ENCOUNTER — Encounter: Payer: Self-pay | Admitting: Internal Medicine

## 2021-12-24 ENCOUNTER — Telehealth: Payer: Self-pay

## 2021-12-24 ENCOUNTER — Ambulatory Visit (INDEPENDENT_AMBULATORY_CARE_PROVIDER_SITE_OTHER): Payer: Medicare HMO | Admitting: Internal Medicine

## 2021-12-24 VITALS — BP 130/70 | HR 81 | Temp 98.2°F | Ht 59.0 in | Wt 166.6 lb

## 2021-12-24 DIAGNOSIS — H6123 Impacted cerumen, bilateral: Secondary | ICD-10-CM

## 2021-12-24 NOTE — Telephone Encounter (Signed)
noted 

## 2021-12-24 NOTE — Telephone Encounter (Signed)
LMTCB in regards to lab results.  

## 2021-12-24 NOTE — Telephone Encounter (Signed)
-----   Message from Crecencio Mc, MD sent at 12/22/2021  9:01 AM EST ----- Your cholesterol is at goal on your current medication and Your  liver and kidney function are normal.  Please continue your current medications and plan  to repeat  fasting labs in 6 months    Regards,  Dr. Derrel Nip    Regards,   Deborra Medina, MD

## 2021-12-24 NOTE — Progress Notes (Unsigned)
Subjective:  Patient ID: Lacey Brown, female    DOB: 12/09/1940  Age: 81 y.o. MRN: [id number]  CC: There were no encounter diagnoses.   HPI Lacey Brown presents for  Chief Complaint  Patient presents with   Acute Visit    Bilateral ear fullness   81 yr old female with hypertension aortic atherosclerosis presents with ear fullness on the right.  Has a history of cerumen impaction requiring iirrigation and removal in the past b y ENT..  has been using     Outpatient Medications Prior to Visit  Medication Sig Dispense Refill   acetaminophen (TYLENOL) 325 MG tablet Take 650 mg by mouth every 6 (six) hours as needed.     amLODipine (NORVASC) 2.5 MG tablet Take 1 tablet (2.5 mg total) by mouth daily. 90 tablet 2   Calcium Carbonate-Vit D-Min (CALCIUM 1200 PO) Take 1 tablet by mouth daily.     Cholecalciferol (VITAMIN D3) 2000 UNITS TABS Take 1 tablet by mouth daily.     fluticasone-salmeterol (ADVAIR DISKUS) 250-50 MCG/ACT AEPB Inhale 1 puff into the lungs in the morning and at bedtime. 60 each 2   metoprolol succinate (TOPROL-XL) 25 MG 24 hr tablet Take 1 tablet (25 mg total) by mouth daily. 90 tablet 1   omeprazole (PRILOSEC OTC) 20 MG tablet Take 20 mg by mouth as needed.      rosuvastatin (CRESTOR) 10 MG tablet Take 1 tablet (10 mg total) by mouth daily. 90 tablet 1   telmisartan (MICARDIS) 80 MG tablet TAKE 1 TABLET BY MOUTH EVERY DAY 90 tablet 1   ADVAIR DISKUS 100-50 MCG/ACT AEPB INHALE 1 PUFF INTO THE LUNGS TWICE A DAY (Patient not taking: Reported on 12/24/2021) 60 each 3   No facility-administered medications prior to visit.    Review of Systems;  Patient denies headache, fevers, malaise, unintentional weight loss, skin rash, eye pain, sinus congestion and sinus pain, sore throat, dysphagia,  hemoptysis , cough, dyspnea, wheezing, chest pain, palpitations, orthopnea, edema, abdominal pain, nausea, melena, diarrhea, constipation, flank pain, dysuria,  hematuria, urinary  Frequency, nocturia, numbness, tingling, seizures,  Focal weakness, Loss of consciousness,  Tremor, insomnia, depression, anxiety, and suicidal ideation.      Objective:  BP 130/70 (BP Location: Left Arm, Patient Position: Sitting, Cuff Size: Normal)   Pulse 81   Temp 98.2 F (36.8 C) (Oral)   Ht '4\' 11"'$  (1.499 m)   Wt 166 lb 9.6 oz (75.6 kg)   SpO2 97%   BMI 33.65 kg/m   BP Readings from Last 3 Encounters:  12/24/21 130/70  11/28/21 (!) 146/74  11/07/21 (!) 140/74    Wt Readings from Last 3 Encounters:  12/24/21 166 lb 9.6 oz (75.6 kg)  12/10/21 162 lb (73.5 kg)  11/28/21 162 lb 12.8 oz (73.8 kg)    General appearance: alert, cooperative and appears stated age Ears: normal TM's and external ear canals both ears Throat: lips, mucosa, and tongue normal; teeth and gums normal Neck: no adenopathy, no carotid bruit, supple, symmetrical, trachea midline and thyroid not enlarged, symmetric, no tenderness/mass/nodules Back: symmetric, no curvature. ROM normal. No CVA tenderness. Lungs: clear to auscultation bilaterally Heart: regular rate and rhythm, S1, S2 normal, no murmur, click, rub or gallop Abdomen: soft, non-tender; bowel sounds normal; no masses,  no organomegaly Pulses: 2+ and symmetric Skin: Skin color, texture, turgor normal. No rashes or lesions Lymph nodes: Cervical, supraclavicular, and axillary nodes normal. Neuro:  awake and interactive with normal mood and  affect. Higher cortical functions are normal. Speech is clear without word-finding difficulty or dysarthria. Extraocular movements are intact. Visual fields of both eyes are grossly intact. Sensation to light touch is grossly intact bilaterally of upper and lower extremities. Motor examination shows 4+/5 symmetric hand grip and upper extremity and 5/5 lower extremity strength. There is no pronation or drift. Gait is non-ataxic   Lab Results  Component Value Date   HGBA1C 6.0 07/25/2021     Lab Results  Component Value Date   CREATININE 0.70 12/19/2021   CREATININE 0.68 07/25/2021   CREATININE 0.68 02/19/2021    Lab Results  Component Value Date   WBC 8.2 07/25/2021   HGB 12.0 07/25/2021   HCT 37.6 07/25/2021   PLT 212.0 07/25/2021   GLUCOSE 92 12/19/2021   CHOL 146 12/19/2021   TRIG 71.0 12/19/2021   HDL 71.40 12/19/2021   LDLDIRECT 54.0 12/19/2021   LDLCALC 60 12/19/2021   ALT 14 12/19/2021   AST 16 12/19/2021   NA 129 (L) 12/19/2021   K 4.4 12/19/2021   CL 96 12/19/2021   CREATININE 0.70 12/19/2021   BUN 18 12/19/2021   CO2 30 12/19/2021   TSH 2.71 07/25/2021   HGBA1C 6.0 07/25/2021   MICROALBUR <0.7 07/25/2021    MM 3D SCREEN BREAST BILATERAL  Result Date: 05/17/2021 CLINICAL DATA:  Screening. EXAM: DIGITAL SCREENING BILATERAL MAMMOGRAM WITH TOMOSYNTHESIS AND CAD TECHNIQUE: Bilateral screening digital craniocaudal and mediolateral oblique mammograms were obtained. Bilateral screening digital breast tomosynthesis was performed. The images were evaluated with computer-aided detection. COMPARISON:  Previous exam(s). ACR Breast Density Category c: The breast tissue is heterogeneously dense, which may obscure small masses. FINDINGS: There are no findings suspicious for malignancy. IMPRESSION: No mammographic evidence of malignancy. A result letter of this screening mammogram will be mailed directly to the patient. RECOMMENDATION: Screening mammogram in one year. (Code:SM-B-01Y) BI-RADS CATEGORY  1: Negative. Electronically Signed   By: Claudie Revering M.D.   On: 05/17/2021 08:41    Assessment & Plan:   Problem List Items Addressed This Visit   None   I spent a total of   minutes with this patient in a face to face visit on the date of this encounter reviewing the last office visit with me in       ,  most recent visit with cardiology ,    ,  patient's diet and exercise habits, home blood pressure /blod sugar readings, recent ER visit including labs and  imaging studies ,   and post visit ordering of testing and therapeutics.    Follow-up: No follow-ups on file.   Crecencio Mc, MD

## 2021-12-24 NOTE — Patient Instructions (Signed)
WE placed a few drops of liquid colace in each ear to soften your ear wax  We will have you return for another flushing in 1-2 days

## 2021-12-24 NOTE — Telephone Encounter (Signed)
error 

## 2021-12-24 NOTE — Telephone Encounter (Signed)
Patient returned office phone call, lab results were read.

## 2021-12-25 ENCOUNTER — Ambulatory Visit (INDEPENDENT_AMBULATORY_CARE_PROVIDER_SITE_OTHER): Payer: Medicare HMO

## 2021-12-25 ENCOUNTER — Other Ambulatory Visit: Payer: Self-pay | Admitting: Internal Medicine

## 2021-12-25 DIAGNOSIS — H6122 Impacted cerumen, left ear: Secondary | ICD-10-CM | POA: Diagnosis not present

## 2021-12-25 DIAGNOSIS — H6123 Impacted cerumen, bilateral: Secondary | ICD-10-CM | POA: Insufficient documentation

## 2021-12-25 MED ORDER — FLUTICASONE-SALMETEROL 250-50 MCG/ACT IN AEPB: 1.0000 | INHALATION_SPRAY | Freq: Two times a day (BID) | RESPIRATORY_TRACT | 2 refills | Status: DC

## 2021-12-25 NOTE — Progress Notes (Signed)
Pt presented for an ear lavage. Pt was identified through two identifiers. After examining both ears pt did have cerumen in both sets. Pt tolerated the flushing well in both ears. After flushing and observing cerumen coming out I rechecked both ears. Both ears appeared to be a lot better than before. I then let pt be examined by her PCP Dr. Derrel Nip to see if she was satisfied with the flushing.  Dr. Derrel Nip said everything was good and pt was free to leave.  [place], CMA

## 2021-12-25 NOTE — Assessment & Plan Note (Signed)
Attempts to disimpact ears with curette and warm water lavage were not successful, and she experienced some dizziness with procedure.  Ears were instilled with liquid colace and cotto balls were placed to prevent drainage.  She will return for lavage via RN visit tomorrow or Wednesday

## 2022-01-02 ENCOUNTER — Ambulatory Visit: Payer: Medicare HMO | Admitting: Orthopaedic Surgery

## 2022-01-03 ENCOUNTER — Telehealth: Payer: Self-pay | Admitting: *Deleted

## 2022-01-03 NOTE — Telephone Encounter (Addendum)
$  301 due for Prolia; PA required (on file)  Due on or after 02/02/22  Prolia [employer] Authorization Approved [id number]   Effective: 06/19/21-06/20/22

## 2022-01-08 DIAGNOSIS — Z6833 Body mass index (BMI) 33.0-33.9, adult: Secondary | ICD-10-CM | POA: Diagnosis not present

## 2022-01-08 DIAGNOSIS — M47816 Spondylosis without myelopathy or radiculopathy, lumbar region: Secondary | ICD-10-CM | POA: Diagnosis not present

## 2022-01-11 ENCOUNTER — Telehealth: Payer: Self-pay | Admitting: Internal Medicine

## 2022-01-11 NOTE — Telephone Encounter (Signed)
The last time she saw Dr Derrel Nip, she gave her some names of over the counter medications for patient's constipation She forgot the names of medication. Could someone please call her with the names.

## 2022-01-16 ENCOUNTER — Ambulatory Visit: Payer: Medicare HMO | Admitting: Orthopaedic Surgery

## 2022-01-16 NOTE — Telephone Encounter (Signed)
Pt returning call

## 2022-01-16 NOTE — Telephone Encounter (Signed)
LMTCB

## 2022-01-17 ENCOUNTER — Encounter: Payer: Self-pay | Admitting: *Deleted

## 2022-01-17 NOTE — Telephone Encounter (Signed)
Pt returning call

## 2022-01-17 NOTE — Telephone Encounter (Signed)
Spoke with pt and she stated that she is taking Metamucil and it is helping with her constipation. Can pt take this daily?

## 2022-01-17 NOTE — Telephone Encounter (Signed)
LMTCB

## 2022-01-17 NOTE — Telephone Encounter (Signed)
Pt aware.

## 2022-01-24 NOTE — Telephone Encounter (Signed)
Spoke with pt and scheduled appt on 02/12/22. Pt aware of amount due at visit.

## 2022-02-02 ENCOUNTER — Other Ambulatory Visit: Payer: Self-pay | Admitting: Internal Medicine

## 2022-02-03 ENCOUNTER — Other Ambulatory Visit: Payer: Self-pay | Admitting: Internal Medicine

## 2022-02-04 DIAGNOSIS — C801 Malignant (primary) neoplasm, unspecified: Secondary | ICD-10-CM

## 2022-02-04 HISTORY — DX: Malignant (primary) neoplasm, unspecified: C80.1

## 2022-02-05 ENCOUNTER — Encounter: Payer: Self-pay | Admitting: Internal Medicine

## 2022-02-05 NOTE — Telephone Encounter (Signed)
Is it okay to refuse? Medication was discontinued on 11/28/2021.

## 2022-02-11 ENCOUNTER — Telehealth: Payer: Self-pay | Admitting: Internal Medicine

## 2022-02-11 NOTE — Telephone Encounter (Signed)
Medication has been refilled.

## 2022-02-11 NOTE — Telephone Encounter (Signed)
Pt need refill on rosuvastatin sent to [facility]

## 2022-02-12 ENCOUNTER — Ambulatory Visit: Payer: Medicare HMO

## 2022-02-12 ENCOUNTER — Telehealth: Payer: Self-pay | Admitting: Internal Medicine

## 2022-02-12 DIAGNOSIS — M810 Age-related osteoporosis without current pathological fracture: Secondary | ICD-10-CM

## 2022-02-12 NOTE — Telephone Encounter (Signed)
The cost of $301

## 2022-02-12 NOTE — Telephone Encounter (Signed)
Patient would like to know if it is necessary to take the prolia injection. Is there something else she can do?  Also, patient is requesting 2 referrals, 1 for a mammogram and 1 for a bone density test.

## 2022-02-13 NOTE — Addendum Note (Signed)
Addended by: Adair Laundry on: 02/13/2022 10:05 AM   Modules accepted: Orders

## 2022-02-13 NOTE — Telephone Encounter (Signed)
Spoke with pt and she stated that she is just going to go forward with the Prolia injection. Pt would also like to get an order to have her bone density scan done since it has been almost 4 years since the last one. Order has been pended for your approval.

## 2022-02-14 ENCOUNTER — Other Ambulatory Visit: Payer: Self-pay | Admitting: *Deleted

## 2022-02-14 DIAGNOSIS — M810 Age-related osteoporosis without current pathological fracture: Secondary | ICD-10-CM

## 2022-02-18 ENCOUNTER — Ambulatory Visit: Payer: Medicare HMO

## 2022-02-18 ENCOUNTER — Telehealth: Payer: Self-pay | Admitting: Internal Medicine

## 2022-02-18 DIAGNOSIS — M810 Age-related osteoporosis without current pathological fracture: Secondary | ICD-10-CM | POA: Diagnosis not present

## 2022-02-18 MED ORDER — DENOSUMAB 60 MG/ML ~~LOC~~ SOSY
60.0000 mg | PREFILLED_SYRINGE | Freq: Once | SUBCUTANEOUS | Status: AC
  Administered 2022-02-18: 60 mg via SUBCUTANEOUS

## 2022-02-18 NOTE — Telephone Encounter (Signed)
Ordered placed and signed. Pt is aware that she can call to have scheduled.

## 2022-02-18 NOTE — Progress Notes (Signed)
Pt presented for their subcutaneous Prolia injection. Pt was identified through two identifiers. Pt was given the information packets about the Prolia and told to schedule their next injection 6 months out. Pt tolerated the subq injection well in the left arm.  

## 2022-02-18 NOTE — Addendum Note (Signed)
Addended by: Adair Laundry on: 02/18/2022 01:20 PM   Modules accepted: Orders

## 2022-02-18 NOTE — Telephone Encounter (Signed)
Patient called and needs a bone density referral. Please call patient when ordered.

## 2022-02-21 ENCOUNTER — Other Ambulatory Visit: Payer: Self-pay | Admitting: Internal Medicine

## 2022-02-21 DIAGNOSIS — Z1231 Encounter for screening mammogram for malignant neoplasm of breast: Secondary | ICD-10-CM

## 2022-02-28 ENCOUNTER — Encounter: Payer: Self-pay | Admitting: Internal Medicine

## 2022-02-28 ENCOUNTER — Ambulatory Visit (INDEPENDENT_AMBULATORY_CARE_PROVIDER_SITE_OTHER): Payer: Medicare HMO | Admitting: Internal Medicine

## 2022-02-28 VITALS — BP 130/62 | HR 75 | Temp 97.6°F | Ht 59.0 in | Wt 163.8 lb

## 2022-02-28 DIAGNOSIS — K5909 Other constipation: Secondary | ICD-10-CM

## 2022-02-28 DIAGNOSIS — I700 Atherosclerosis of aorta: Secondary | ICD-10-CM

## 2022-02-28 DIAGNOSIS — J439 Emphysema, unspecified: Secondary | ICD-10-CM | POA: Diagnosis not present

## 2022-02-28 DIAGNOSIS — I10 Essential (primary) hypertension: Secondary | ICD-10-CM | POA: Diagnosis not present

## 2022-02-28 DIAGNOSIS — M810 Age-related osteoporosis without current pathological fracture: Secondary | ICD-10-CM | POA: Diagnosis not present

## 2022-02-28 MED ORDER — ALBUTEROL SULFATE HFA 108 (90 BASE) MCG/ACT IN AERS: 2.0000 | INHALATION_SPRAY | Freq: Four times a day (QID) | RESPIRATORY_TRACT | 11 refills | Status: AC | PRN

## 2022-02-28 NOTE — Patient Instructions (Addendum)
CONSTIPATION CAN CAUSE GAS,  SO CAN YOUR FOODS.     FOR THE GAS CAUSED BY VEGETABLES : Try using beano before any meal with any pinto beans , cabbage,  Broccoli, cauliflower and brussel sprouts    FOR THE CONSTIPATION:  TAKE THE METAMUCIL CAPSULE EVERY DAY WITH DINNER .  TAKE THE  STOOL SOFTER (DOCUSATE) EVERY  NIGHT     IF YOU STILL GET GAS PAINS,  USE MYLANTA GAS ,  GAS X , OR PHASYME  NOT MORE THAN ONCE DAILY    Advair and wixela are the same drug ; it is just a different name made by a different company

## 2022-02-28 NOTE — Progress Notes (Signed)
Subjective:  Patient ID: Lacey Brown, female    DOB: 1940-09-28  Age: 82 y.o. MRN: [id number]  CC: The primary encounter diagnosis was Osteoporosis, unspecified osteoporosis type, unspecified pathological fracture presence. Diagnoses of Primary hypertension, Aortic atherosclerosis ([place]), Chronic constipation, and Pulmonary emphysema, unspecified emphysema type ([place]) were also pertinent to this visit.   HPI Lacey Brown presents for follow up on constipation Chief Complaint  Patient presents with   Constipation    Has BM every 3 days but more recently daily   Abdominal Pain    Mid upper right and left quadrant rates at 5/10, mostly at night.  Also reports gas and gurgling sound with it.    82 YR OLD FEMALE WITH CHRONIC CONSTIPATION , osteoporosis,  COPD and degenerative lumbar disk disease presents with recurrent episodes of bilateral upper quadrant pain relieved with flatus .  Not sure if related to certain foods ,  but recalls that it occurred once after eating a salad.  She denies diarrhea, nausea and fear of eating.  No unintentional weight loss.   Taking a stool softener and metamucil prn , but not daily  Bowles not moving daily.   2) COPD   :  she is using advair  twice daily.  Pharmacy has told her that she will need to switch  to [facility] in the future.   Explained that wixela and advair are the same.  Reviewed prn use of albuterol    3) HTN:  Patient is taking her medications as prescribed and notes no adverse effects.  Home BP readings have been done about once per week and are  generally < 130/80 .  She is avoiding added salt in her diet and not walking regularly  due to chronic back pain    Outpatient Medications Prior to Visit  Medication Sig Dispense Refill   acetaminophen (TYLENOL) 325 MG tablet Take 650 mg by mouth every 6 (six) hours as needed.     ADVAIR DISKUS 250-50 MCG/ACT AEPB INHALE 1 PUFF INTO THE LUNGS IN THE MORNING AND AT BEDTIME. 60 each 1    amLODipine (NORVASC) 2.5 MG tablet Take 1 tablet (2.5 mg total) by mouth daily. 90 tablet 2   Calcium Carbonate-Vit D-Min (CALCIUM 1200 PO) Take 1 tablet by mouth daily.     Cholecalciferol (VITAMIN D3) 2000 UNITS TABS Take 1 tablet by mouth daily.     metoprolol succinate (TOPROL-XL) 25 MG 24 hr tablet Take 1 tablet (25 mg total) by mouth daily. 90 tablet 1   omeprazole (PRILOSEC OTC) 20 MG tablet Take 20 mg by mouth as needed.      rosuvastatin (CRESTOR) 10 MG tablet TAKE 1 TABLET BY MOUTH EVERY DAY 90 tablet 1   telmisartan (MICARDIS) 80 MG tablet TAKE 1 TABLET BY MOUTH EVERY DAY 90 tablet 1   No facility-administered medications prior to visit.    Review of Systems;  Patient denies headache, fevers, malaise, unintentional weight loss, skin rash, eye pain, sinus congestion and sinus pain, sore throat, dysphagia,  hemoptysis , cough, dyspnea, wheezing, chest pain, palpitations, orthopnea, edema,, nausea, melena, diarrhea, constipation, flank pain, dysuria, hematuria, urinary  Frequency, nocturia, numbness, tingling, seizures,  Focal weakness, Loss of consciousness,  Tremor, insomnia, depression, anxiety, and suicidal ideation.      Objective:  BP 130/62   Pulse 75   Temp 97.6 F (36.4 C) (Oral)   Ht '4\' 11"'$  (1.499 m)   Wt 163 lb 12.8 oz (74.3 kg)  SpO2 98%   BMI 33.08 kg/m   BP Readings from Last 3 Encounters:  02/28/22 130/62  12/24/21 130/70  11/28/21 (!) 146/74    Wt Readings from Last 3 Encounters:  02/28/22 163 lb 12.8 oz (74.3 kg)  12/24/21 166 lb 9.6 oz (75.6 kg)  12/10/21 162 lb (73.5 kg)    Physical Exam Vitals reviewed.  Constitutional:      General: She is not in acute distress.    Appearance: Normal appearance. She is normal weight. She is not ill-appearing, toxic-appearing or diaphoretic.  HENT:     Head: Normocephalic.  Eyes:     General: No scleral icterus.       Right eye: No discharge.        Left eye: No discharge.     Conjunctiva/sclera:  Conjunctivae normal.  Cardiovascular:     Rate and Rhythm: Normal rate and regular rhythm.     Heart sounds: Normal heart sounds.  Pulmonary:     Effort: Pulmonary effort is normal. No respiratory distress.     Breath sounds: Normal breath sounds.  Abdominal:     General: Abdomen is protuberant. Bowel sounds are normal. There is distension. There is no abdominal bruit.     Palpations: Abdomen is soft. There is no fluid wave, hepatomegaly, mass or pulsatile mass.     Tenderness: There is no abdominal tenderness.  Musculoskeletal:        General: Normal range of motion.  Skin:    General: Skin is warm and dry.  Neurological:     General: No focal deficit present.     Mental Status: She is alert and oriented to person, place, and time. Mental status is at baseline.  Psychiatric:        Mood and Affect: Mood normal.        Behavior: Behavior normal.        Thought Content: Thought content normal.        Judgment: Judgment normal.     Lab Results  Component Value Date   HGBA1C 6.0 07/25/2021    Lab Results  Component Value Date   CREATININE 0.70 12/19/2021   CREATININE 0.68 07/25/2021   CREATININE 0.68 02/19/2021    Lab Results  Component Value Date   WBC 8.2 07/25/2021   HGB 12.0 07/25/2021   HCT 37.6 07/25/2021   PLT 212.0 07/25/2021   GLUCOSE 92 12/19/2021   CHOL 146 12/19/2021   TRIG 71.0 12/19/2021   HDL 71.40 12/19/2021   LDLDIRECT 54.0 12/19/2021   LDLCALC 60 12/19/2021   ALT 14 12/19/2021   AST 16 12/19/2021   NA 129 (L) 12/19/2021   K 4.4 12/19/2021   CL 96 12/19/2021   CREATININE 0.70 12/19/2021   BUN 18 12/19/2021   CO2 30 12/19/2021   TSH 2.71 07/25/2021   HGBA1C 6.0 07/25/2021   MICROALBUR <0.7 07/25/2021    MM 3D SCREEN BREAST BILATERAL  Result Date: 05/17/2021 CLINICAL DATA:  Screening. EXAM: DIGITAL SCREENING BILATERAL MAMMOGRAM WITH TOMOSYNTHESIS AND CAD TECHNIQUE: Bilateral screening digital craniocaudal and mediolateral oblique mammograms  were obtained. Bilateral screening digital breast tomosynthesis was performed. The images were evaluated with computer-aided detection. COMPARISON:  Previous exam(s). ACR Breast Density Category c: The breast tissue is heterogeneously dense, which may obscure small masses. FINDINGS: There are no findings suspicious for malignancy. IMPRESSION: No mammographic evidence of malignancy. A result letter of this screening mammogram will be mailed directly to the patient. RECOMMENDATION: Screening mammogram in one year. (Code:SM-B-01Y)  BI-RADS CATEGORY  1: Negative. Electronically Signed   By: Claudie Revering M.D.   On: 05/17/2021 08:41    Assessment & Plan:  .Osteoporosis, unspecified osteoporosis type, unspecified pathological fracture presence Assessment & Plan: With history of  thoracic and  L1-L2 vertebral compression fractures noted on plain  films in 2019 and again on March 2023 CT scan.  Continue Prolia for management    Primary hypertension Assessment & Plan: Managed with amlodipine 2.5 mg telmisartan 80 mg and  metoprolol 25 mg   daily.  Home readings have been  < 140/90   Aortic atherosclerosis ([facility]) Assessment & Plan: Managed with use of rosuvastin  every other day    Chronic constipation Assessment & Plan: Abdominal exam is normal.  Bowel regimen of daliy stool softener and BFL advised.    Pulmonary emphysema, unspecified emphysema type ([place]) Assessment & Plan: Presumed given her history of tobacco abuse .  The history of her diagnosis is unclear and over 62 years old and based on one presentation after a traumatic event.  It does not appear that she ever had PFTs. October 2023 Chest x ray reviewed,  noting hyperinflation and eventration of the right hemidiaphram, likely due to bowel.  PFT's were ordered but deferred by patient. Continue Advair/Wixela as maintenance and prn albuterol.    Other orders -     Albuterol Sulfate HFA; Inhale 2 puffs into the lungs every 6 (six) hours as  needed for wheezing or shortness of breath.  Dispense: 3.7 g; Refill: 11     I provided 30 minutes of face-to-face time during this encounter reviewing patient's last visit with me, patient's  most recent visit with neurosurgery,  recent surgical and non surgical procedures, previous  labs and imaging studies, counseling on currently addressed issues,  and post visit ordering to diagnostics and therapeutics .   Follow-up: Return in about 3 months (around 05/30/2022).   Crecencio Mc, MD

## 2022-03-02 DIAGNOSIS — K5909 Other constipation: Secondary | ICD-10-CM | POA: Insufficient documentation

## 2022-03-02 NOTE — Assessment & Plan Note (Signed)
Managed with amlodipine 2.5 mg telmisartan 80 mg and  metoprolol 25 mg   daily.  Home readings have been  < 140/90

## 2022-03-02 NOTE — Assessment & Plan Note (Signed)
Managed with use of rosuvastin  every other day

## 2022-03-02 NOTE — Assessment & Plan Note (Addendum)
Presumed given her history of tobacco abuse .  The history of her diagnosis is unclear and over 82 years old and based on one presentation after a traumatic event.  It does not appear that she ever had PFTs. October 2023 Chest x ray reviewed,  noting hyperinflation and eventration of the right hemidiaphram, likely due to bowel.  PFT's were ordered but deferred by patient. Continue Advair/Wixela as maintenance and prn albuterol.

## 2022-03-02 NOTE — Assessment & Plan Note (Signed)
With history of  thoracic and  L1-L2 vertebral compression fractures noted on plain  films in 2019 and again on March 2023 CT scan.  Continue Prolia for management

## 2022-03-02 NOTE — Assessment & Plan Note (Signed)
Abdominal exam is normal.  Bowel regimen of daliy stool softener and BFL advised.

## 2022-03-06 ENCOUNTER — Ambulatory Visit: Payer: Medicare HMO | Admitting: Orthopaedic Surgery

## 2022-03-06 ENCOUNTER — Encounter: Payer: Self-pay | Admitting: Orthopaedic Surgery

## 2022-03-06 DIAGNOSIS — G8929 Other chronic pain: Secondary | ICD-10-CM

## 2022-03-06 DIAGNOSIS — M25562 Pain in left knee: Secondary | ICD-10-CM | POA: Diagnosis not present

## 2022-03-06 DIAGNOSIS — M7542 Impingement syndrome of left shoulder: Secondary | ICD-10-CM | POA: Diagnosis not present

## 2022-03-06 MED ORDER — METHYLPREDNISOLONE ACETATE 40 MG/ML IJ SUSP
40.0000 mg | INTRAMUSCULAR | Status: AC | PRN
  Administered 2022-03-06: 40 mg via INTRA_ARTICULAR

## 2022-03-06 MED ORDER — LIDOCAINE HCL 1 % IJ SOLN
3.0000 mL | INTRAMUSCULAR | Status: AC | PRN
  Administered 2022-03-06: 3 mL

## 2022-03-06 NOTE — Progress Notes (Signed)
The patient is an 82 year old female that I see every so often for injections of steroid in her left shoulder left knee.  Exactly been just over 5 months since I injected her left shoulder for impingement syndrome in her left knee for the pain from osteoarthritis.  She said she is would like to have injections today and she is doing well otherwise.  She is not a diabetic.  She walks without assistive device.  She says the injections have lasted this long.  On exam she has signs of impingement of the left shoulder but no blocks or rotation.  Her left knee moves well with slight varus malalignment and some global tenderness and pain from arthritis.  I did place a steroid injection in her left shoulder subacromial outlet and her left knee without difficulty.  She knows to wait at least 3 months between injections at a minimum.  If things worsen she will let us know.  All questions and concerns were addressed and answered.       Procedure Note  Patient: Lacey Brown             Date of Birth: 1940-06-25           MRN: [id number]             Visit Date: 03/06/2022  Procedures: Visit Diagnoses:  1. Chronic pain of left knee   2. Impingement syndrome of left shoulder     Large Joint Inj: L knee on 03/06/2022 1:26 PM Indications: diagnostic evaluation and pain Details: 22 G 1.5 in needle, superolateral approach  Arthrogram: No  Medications: 3 mL lidocaine 1 %; 40 mg methylPREDNISolone acetate 40 MG/ML Outcome: tolerated well, no immediate complications Procedure, treatment alternatives, risks and benefits explained, specific risks discussed. Consent was given by the patient. Immediately prior to procedure a time out was called to verify the correct patient, procedure, equipment, support staff and site/side marked as required. Patient was prepped and draped in the usual sterile fashion.    Large Joint Inj: L subacromial bursa on 03/06/2022 1:26 PM Indications: pain and diagnostic  evaluation Details: 22 G 1.5 in needle  Arthrogram: No  Medications: 3 mL lidocaine 1 %; 40 mg methylPREDNISolone acetate 40 MG/ML Outcome: tolerated well, no immediate complications Procedure, treatment alternatives, risks and benefits explained, specific risks discussed. Consent was given by the patient. Immediately prior to procedure a time out was called to verify the correct patient, procedure, equipment, support staff and site/side marked as required. Patient was prepped and draped in the usual sterile fashion.

## 2022-03-19 DIAGNOSIS — M47816 Spondylosis without myelopathy or radiculopathy, lumbar region: Secondary | ICD-10-CM | POA: Diagnosis not present

## 2022-04-18 ENCOUNTER — Other Ambulatory Visit: Payer: Self-pay | Admitting: Internal Medicine

## 2022-05-07 DIAGNOSIS — M5416 Radiculopathy, lumbar region: Secondary | ICD-10-CM | POA: Diagnosis not present

## 2022-05-14 DIAGNOSIS — M5416 Radiculopathy, lumbar region: Secondary | ICD-10-CM | POA: Diagnosis not present

## 2022-05-16 DIAGNOSIS — M5416 Radiculopathy, lumbar region: Secondary | ICD-10-CM | POA: Diagnosis not present

## 2022-05-22 ENCOUNTER — Ambulatory Visit: Payer: Medicare HMO | Admitting: Orthopaedic Surgery

## 2022-05-22 ENCOUNTER — Other Ambulatory Visit (INDEPENDENT_AMBULATORY_CARE_PROVIDER_SITE_OTHER): Payer: Medicare HMO

## 2022-05-22 ENCOUNTER — Encounter: Payer: Self-pay | Admitting: Orthopaedic Surgery

## 2022-05-22 DIAGNOSIS — M25512 Pain in left shoulder: Secondary | ICD-10-CM | POA: Diagnosis not present

## 2022-05-22 DIAGNOSIS — G8929 Other chronic pain: Secondary | ICD-10-CM

## 2022-05-22 MED ORDER — METHYLPREDNISOLONE ACETATE 40 MG/ML IJ SUSP
40.0000 mg | INTRAMUSCULAR | Status: AC | PRN
  Administered 2022-05-22: 40 mg via INTRA_ARTICULAR

## 2022-05-22 MED ORDER — LIDOCAINE HCL 1 % IJ SOLN
3.0000 mL | INTRAMUSCULAR | Status: AC | PRN
  Administered 2022-05-22: 3 mL

## 2022-05-22 NOTE — Progress Notes (Signed)
The patient is an 82 year old female who continues to follow-up as a relates to chronic left shoulder pain.  She points to the subdeltoid area in the biceps area as a source of her pain.  We last placed an injection in her shoulder about 11 weeks ago.  She says that helped for a while but is not helping anymore.  She already goes to physical therapy agrees with physical therapy for her spine.  I feel it be prudent to have physical therapy work on her left shoulder as an outpatient with any modalities that can help decrease her shoulder pain.  On exam she does show some deficits in the rotator cuff with pain in the subdeltoid and biceps tendon area as well.  3 views of the left shoulder send no acute findings.  The humeral head is slightly high riding but the glenohumeral joint is well-maintained as well as the [facility] joint.  The bone is osteopenic.  She likely has chronic rotator cuff tearing.  I did let her know that if I placed a steroid injection in her shoulder today that I would not want to do this again for a while.  She agreed to this and did tolerate a steroid injection in her left shoulder well.  I gave her prescription for outpatient physical therapy with [facility] physical therapy.  She knows to wait at least 3 to 4 months before being seen again if she needs to.     Procedure Note  Patient: Lacey Brown             Date of Birth: [birth year]           MRN: [id number]             Visit Date: 05/22/2022  Procedures: Visit Diagnoses:  1. Chronic left shoulder pain     Large Joint Inj: L subacromial bursa on 05/22/2022 2:14 PM Indications: pain and diagnostic evaluation Details: 22 G 1.5 in needle  Arthrogram: No  Medications: 3 mL lidocaine 1 %; 40 mg methylPREDNISolone acetate 40 MG/ML Outcome: tolerated well, no immediate complications Procedure, treatment alternatives, risks and benefits explained, specific risks discussed. Consent was given by the patient. Immediately  prior to procedure a time out was called to verify the correct patient, procedure, equipment, support staff and site/side marked as required. Patient was prepped and draped in the usual sterile fashion.

## 2022-05-29 DIAGNOSIS — M25512 Pain in left shoulder: Secondary | ICD-10-CM | POA: Diagnosis not present

## 2022-05-29 DIAGNOSIS — M5416 Radiculopathy, lumbar region: Secondary | ICD-10-CM | POA: Diagnosis not present

## 2022-06-03 ENCOUNTER — Ambulatory Visit: Payer: Medicare HMO | Admitting: Orthopaedic Surgery

## 2022-06-05 ENCOUNTER — Ambulatory Visit (INDEPENDENT_AMBULATORY_CARE_PROVIDER_SITE_OTHER): Payer: Medicare HMO | Admitting: Internal Medicine

## 2022-06-05 ENCOUNTER — Encounter: Payer: Self-pay | Admitting: Internal Medicine

## 2022-06-05 VITALS — BP 144/68 | HR 74 | Temp 98.1°F | Ht 59.0 in | Wt 163.8 lb

## 2022-06-05 DIAGNOSIS — R630 Anorexia: Secondary | ICD-10-CM | POA: Insufficient documentation

## 2022-06-05 DIAGNOSIS — K2900 Acute gastritis without bleeding: Secondary | ICD-10-CM | POA: Diagnosis not present

## 2022-06-05 DIAGNOSIS — E559 Vitamin D deficiency, unspecified: Secondary | ICD-10-CM | POA: Diagnosis not present

## 2022-06-05 DIAGNOSIS — K5909 Other constipation: Secondary | ICD-10-CM

## 2022-06-05 DIAGNOSIS — M1990 Unspecified osteoarthritis, unspecified site: Secondary | ICD-10-CM

## 2022-06-05 DIAGNOSIS — I10 Essential (primary) hypertension: Secondary | ICD-10-CM | POA: Diagnosis not present

## 2022-06-05 DIAGNOSIS — R7303 Prediabetes: Secondary | ICD-10-CM

## 2022-06-05 DIAGNOSIS — E7800 Pure hypercholesterolemia, unspecified: Secondary | ICD-10-CM

## 2022-06-05 DIAGNOSIS — M810 Age-related osteoporosis without current pathological fracture: Secondary | ICD-10-CM | POA: Diagnosis not present

## 2022-06-05 MED ORDER — METOPROLOL SUCCINATE ER 25 MG PO TB24: 25.0000 mg | ORAL_TABLET | Freq: Every day | ORAL | 1 refills | Status: DC

## 2022-06-05 MED ORDER — CELECOXIB 100 MG PO CAPS: 100.0000 mg | ORAL_CAPSULE | Freq: Two times a day (BID) | ORAL | 2 refills | Status: DC

## 2022-06-05 MED ORDER — OMEPRAZOLE MAGNESIUM 20 MG PO TBEC: 20.0000 mg | DELAYED_RELEASE_TABLET | Freq: Every day | ORAL | 1 refills | Status: DC

## 2022-06-05 MED ORDER — ROSUVASTATIN CALCIUM 10 MG PO TABS: 10.0000 mg | ORAL_TABLET | Freq: Every day | ORAL | 1 refills | Status: DC

## 2022-06-05 NOTE — Assessment & Plan Note (Addendum)
Concern that her symptoms are due to  gastritis from use of NSAIDs.  Changing aleve to celebrex,  and adding omeprazole daily .  Checking renal function and h pylori

## 2022-06-05 NOTE — Assessment & Plan Note (Signed)
Tolerating crestor  .  Repeat lipids due

## 2022-06-05 NOTE — Assessment & Plan Note (Signed)
Abdominal exam is normal.  Bowel regimen of daliy stool softener and BFL advised.  

## 2022-06-05 NOTE — Assessment & Plan Note (Addendum)
Improved contro lwith amlodipine 2.5 mg telmisartan 80 mg and  metoprolol 25 mg   daily.  Home readings have been  < 140/90

## 2022-06-05 NOTE — Patient Instructions (Addendum)
You should continue taking tylenol 1000 mg twice daily for pain .  If you need additional pain medications, add  celebrex  INSTEAD OF ALEVE   The aleve may be causing your stomach issues . I AM CHANGING IT TO   CELEBREX ;  IT IS SAFER AND DOES THE SAME THING AS ALEVE   IF YOU CONTINUE  TAKING CELEBREX,  YOU SHOULD TAKE THE OMEPRAZOLE TO PROTECT YOUR STOMACH  (IT'S NOT JUST FOR INDIGESTION;  IT LOWERS STOMACH ACID)

## 2022-06-05 NOTE — Assessment & Plan Note (Addendum)
Affecting low back knees .  She is using naproxen every 12 hours to augment tylenol.  Advised her to use cox 2 inhibitor  and PPI  to avoid gastritis

## 2022-06-05 NOTE — Progress Notes (Signed)
Subjective:  Patient ID: Lacey Brown, female    DOB: 02-11-1940  Age: 82 y.o. MRN: [id number]  CC: The primary encounter diagnosis was Acute gastritis, presence of bleeding unspecified, unspecified gastritis type. Diagnoses of Arthritis, Vitamin D deficiency, Pure hypercholesterolemia, Prediabetes, Osteoporosis, unspecified osteoporosis type, unspecified pathological fracture presence, Chronic constipation, Primary hypertension, and Anorexia were also pertinent to this visit.   HPI Lacey Brown presents for  3 month follow up  Chief Complaint  Patient presents with   Medical Management of Chronic Issues    1) new onset anorexia occurring in the morning.  She has been taking aleve twice daily   for the past several weeks after seeing Dr Magnus Ivan 2 weeks ago for persistent shoulder pain and using omeprazole "as needed"  for  management of arthralgias affecting multiple joints, including knees and shoulders.  Received a second steroid injection in left shoulder April 17 by Magnus Ivan for probable rotator cuff tear  ,  has  bilateral OA of knees.    2)  HTN : TAKING telmisartan and amlodipine and crestor in the morning.  Takes toprol with tylenol at bedtime   3) Constipation: improved with metamucil and stool softener as needed   4) sleeping well   Outpatient Medications Prior to Visit  Medication Sig Dispense Refill   acetaminophen (TYLENOL) 325 MG tablet Take 650 mg by mouth every 6 (six) hours as needed.     ADVAIR DISKUS 250-50 MCG/ACT AEPB INHALE 1 PUFF INTO THE LUNGS IN THE MORNING AND AT BEDTIME. 60 each 1   albuterol (VENTOLIN HFA) 108 (90 Base) MCG/ACT inhaler Inhale 2 puffs into the lungs every 6 (six) hours as needed for wheezing or shortness of breath. 3.7 g 11   amLODipine (NORVASC) 2.5 MG tablet TAKE 1 TABLET BY MOUTH EVERY DAY 90 tablet 2   Calcium Carbonate-Vit D-Min (CALCIUM 1200 PO) Take 1 tablet by mouth daily.     Cholecalciferol (VITAMIN D3) 2000 UNITS  TABS Take 1 tablet by mouth daily.     telmisartan (MICARDIS) 80 MG tablet TAKE 1 TABLET BY MOUTH EVERY DAY 90 tablet 1   metoprolol succinate (TOPROL-XL) 25 MG 24 hr tablet Take 1 tablet (25 mg total) by mouth daily. 90 tablet 1   naproxen sodium (ALEVE) 220 MG tablet Take 220 mg by mouth 2 (two) times daily with a meal.     omeprazole (PRILOSEC OTC) 20 MG tablet Take 20 mg by mouth as needed.      rosuvastatin (CRESTOR) 10 MG tablet TAKE 1 TABLET BY MOUTH EVERY DAY 90 tablet 1   No facility-administered medications prior to visit.    Review of Systems;  Patient denies headache, fevers, malaise, unintentional weight loss, skin rash, eye pain, sinus congestion and sinus pain, sore throat, dysphagia,  hemoptysis , cough, dyspnea, wheezing, chest pain, palpitations, orthopnea, edema, abdominal pain, nausea, melena, diarrhea, constipation, flank pain, dysuria, hematuria, urinary  Frequency, nocturia, numbness, tingling, seizures,  Focal weakness, Loss of consciousness,  Tremor, insomnia, depression, anxiety, and suicidal ideation.      Objective:  BP (!) 144/68   Pulse 74   Temp 98.1 F (36.7 C) (Oral)   Ht 4\' 11"  (1.499 m)   Wt 163 lb 12.8 oz (74.3 kg)   SpO2 95%   BMI 33.08 kg/m   BP Readings from Last 3 Encounters:  06/05/22 (!) 144/68  02/28/22 130/62  12/24/21 130/70    Wt Readings from Last 3 Encounters:  06/05/22 163 lb  12.8 oz (74.3 kg)  02/28/22 163 lb 12.8 oz (74.3 kg)  12/24/21 166 lb 9.6 oz (75.6 kg)    Physical Exam  Lab Results  Component Value Date   HGBA1C 6.0 07/25/2021    Lab Results  Component Value Date   CREATININE 0.70 12/19/2021   CREATININE 0.68 07/25/2021   CREATININE 0.68 02/19/2021    Lab Results  Component Value Date   WBC 8.2 07/25/2021   HGB 12.0 07/25/2021   HCT 37.6 07/25/2021   PLT 212.0 07/25/2021   GLUCOSE 92 12/19/2021   CHOL 146 12/19/2021   TRIG 71.0 12/19/2021   HDL 71.40 12/19/2021   LDLDIRECT 54.0 12/19/2021    LDLCALC 60 12/19/2021   ALT 14 12/19/2021   AST 16 12/19/2021   NA 129 (L) 12/19/2021   K 4.4 12/19/2021   CL 96 12/19/2021   CREATININE 0.70 12/19/2021   BUN 18 12/19/2021   CO2 30 12/19/2021   TSH 2.71 07/25/2021   HGBA1C 6.0 07/25/2021   MICROALBUR <0.7 07/25/2021    MM 3D SCREEN BREAST BILATERAL  Result Date: 05/17/2021 CLINICAL DATA:  Screening. EXAM: DIGITAL SCREENING BILATERAL MAMMOGRAM WITH TOMOSYNTHESIS AND CAD TECHNIQUE: Bilateral screening digital craniocaudal and mediolateral oblique mammograms were obtained. Bilateral screening digital breast tomosynthesis was performed. The images were evaluated with computer-aided detection. COMPARISON:  Previous exam(s). ACR Breast Density Category c: The breast tissue is heterogeneously dense, which may obscure small masses. FINDINGS: There are no findings suspicious for malignancy. IMPRESSION: No mammographic evidence of malignancy. A result letter of this screening mammogram will be mailed directly to the patient. RECOMMENDATION: Screening mammogram in one year. (Code:SM-B-01Y) BI-RADS CATEGORY  1: Negative. Electronically Signed   By: Beckie Salts M.D.   On: 05/17/2021 08:41    Assessment & Plan:  .Acute gastritis, presence of bleeding unspecified, unspecified gastritis type -     Metoprolol Succinate ER; Take 1 tablet (25 mg total) by mouth daily.  Dispense: 90 tablet; Refill: 1 -     CBC with Differential/Platelet -     Comprehensive metabolic panel -     H. pylori antibody, IgG  Arthritis Assessment & Plan: Affecting low back knees .  She is using naproxen every 12 hours to augment tylenol.  Advised her to use cox 2 inhibitor  and PPI  to avoid gastritis    Vitamin D deficiency -     VITAMIN D 25 Hydroxy (Vit-D Deficiency, Fractures)  Pure hypercholesterolemia Assessment & Plan: Tolerating crestor  .  Repeat lipids due   Orders: -     Lipid panel -     LDL cholesterol, direct  Prediabetes -     Hemoglobin  A1c  Osteoporosis, unspecified osteoporosis type, unspecified pathological fracture presence Assessment & Plan: Checking vitamin d today.  Next Prolia dose is due in mid July (last one Jan 15)   Chronic constipation Assessment & Plan: Abdominal exam is normal.  Bowel regimen of daliy stool softener and BFL advised.    Primary hypertension Assessment & Plan: Improved contro lwith amlodipine 2.5 mg telmisartan 80 mg and  metoprolol 25 mg   daily.  Home readings have been  < 140/90   Anorexia Assessment & Plan: Concern that her symptoms are due to  gastritis from use of NSAIDs.  Changing aleve to celebrex,  and adding omeprazole daily .  Checking renal function and h pylori   Other orders -     Rosuvastatin Calcium; Take 1 tablet (10 mg total) by mouth  daily.  Dispense: 90 tablet; Refill: 1 -     Celecoxib; Take 1 capsule (100 mg total) by mouth 2 (two) times daily.  Dispense: 60 capsule; Refill: 2 -     Omeprazole Magnesium; Take 1 tablet (20 mg total) by mouth daily. In the morning on an empty stomach  Dispense: 90 tablet; Refill: 1     I provided 30 minutes of face-to-face time during this encounter reviewing patient's last visit with me, patient's  most recent visit with cardiology,  nephrology,  and neurology,  recent surgical and non surgical procedures, previous  labs and imaging studies, counseling on currently addressed issues,  and post visit ordering to diagnostics and therapeutics .   Follow-up: Return in about 6 months (around 12/06/2022).   Sherlene Shams, MD

## 2022-06-05 NOTE — Assessment & Plan Note (Addendum)
Checking vitamin d today.  Next Prolia dose is due in mid July (last one Jan 15)

## 2022-06-06 ENCOUNTER — Other Ambulatory Visit: Payer: Medicare HMO

## 2022-06-06 DIAGNOSIS — R7303 Prediabetes: Secondary | ICD-10-CM

## 2022-06-06 DIAGNOSIS — E559 Vitamin D deficiency, unspecified: Secondary | ICD-10-CM | POA: Insufficient documentation

## 2022-06-06 LAB — LIPID PANEL
Cholesterol: 135 mg/dL (ref 0–200)
HDL: 75.2 mg/dL (ref >39.00)
LDL Cholesterol: 46 mg/dL (ref 0–99)
NonHDL: 59.68
Total CHOL/HDL Ratio: 2
Triglycerides: 68 mg/dL (ref 0.0–149.0)
VLDL: 13.6 mg/dL (ref 0.0–40.0)

## 2022-06-06 LAB — CBC WITH DIFFERENTIAL/PLATELET
Basophils Absolute: 0.1 10*3/uL (ref 0.0–0.1)
Basophils Relative: 1.3 % (ref 0.0–3.0)
Eosinophils Absolute: 0.3 10*3/uL (ref 0.0–0.7)
Eosinophils Relative: 2.8 % (ref 0.0–5.0)
HCT: 36.5 % (ref 36.0–46.0)
Hemoglobin: 12.3 g/dL (ref 12.0–15.0)
Lymphocytes Relative: 18.6 % (ref 12.0–46.0)
Lymphs Abs: 1.8 10*3/uL (ref 0.7–4.0)
MCHC: 33.7 g/dL (ref 30.0–36.0)
MCV: 92.8 fl (ref 78.0–100.0)
Monocytes Absolute: 1 10*3/uL (ref 0.1–1.0)
Monocytes Relative: 10.7 % (ref 3.0–12.0)
Neutro Abs: 6.3 10*3/uL (ref 1.4–7.7)
Neutrophils Relative %: 66.6 % (ref 43.0–77.0)
Platelets: 215 10*3/uL (ref 150.0–400.0)
RBC: 3.94 Mil/uL (ref 3.87–5.11)
RDW: 13.8 % (ref 11.5–15.5)
WBC: 9.4 10*3/uL (ref 4.0–10.5)

## 2022-06-06 LAB — COMPREHENSIVE METABOLIC PANEL
ALT: 16 U/L (ref 0–35)
AST: 15 U/L (ref 0–37)
Albumin: 4 g/dL (ref 3.5–5.2)
Alkaline Phosphatase: 32 U/L — ABNORMAL LOW (ref 39–117)
BUN: 21 mg/dL (ref 6–23)
CO2: 27 mEq/L (ref 19–32)
Calcium: 8.7 mg/dL (ref 8.4–10.5)
Chloride: 97 mEq/L (ref 96–112)
Creatinine, Ser: 0.79 mg/dL (ref 0.40–1.20)
GFR: 69.28 mL/min (ref >60.00)
Glucose, Bld: 91 mg/dL (ref 70–99)
Potassium: 4.6 mEq/L (ref 3.5–5.1)
Sodium: 131 mEq/L — ABNORMAL LOW (ref 135–145)
Total Bilirubin: 0.4 mg/dL (ref 0.2–1.2)
Total Protein: 6.1 g/dL (ref 6.0–8.3)

## 2022-06-06 LAB — LDL CHOLESTEROL, DIRECT: Direct LDL: 41 mg/dL

## 2022-06-06 LAB — H. PYLORI ANTIBODY, IGG: H Pylori IgG: NEGATIVE

## 2022-06-06 LAB — VITAMIN D 25 HYDROXY (VIT D DEFICIENCY, FRACTURES): VITD: 21.81 ng/mL — ABNORMAL LOW (ref 30.00–100.00)

## 2022-06-06 MED ORDER — ERGOCALCIFEROL 1.25 MG (50000 UT) PO CAPS: 50000.0000 [IU] | ORAL_CAPSULE | ORAL | 0 refills | Status: DC

## 2022-06-06 NOTE — Addendum Note (Signed)
Addended by: Sherlene Shams on: 06/06/2022 10:19 PM   Modules accepted: Orders

## 2022-06-07 ENCOUNTER — Telehealth: Payer: Self-pay | Admitting: Internal Medicine

## 2022-06-07 LAB — HEMOGLOBIN A1C
Hgb A1c MFr Bld: 6.1 % of total Hgb — ABNORMAL HIGH (ref <5.7)
Mean Plasma Glucose: 128 mg/dL
eAG (mmol/L): 7.1 mmol/L

## 2022-06-07 NOTE — Telephone Encounter (Signed)
See result note message. Pt was called and advised of the results and why vitamin d2 was sent in.

## 2022-06-07 NOTE — Telephone Encounter (Signed)
Pt called in staying that she does not know why Dr.Tullo send med Vitamin D2 at her pharmacy? Pt also its looking to find out her lab results from Wednesday 5/1.

## 2022-06-13 DIAGNOSIS — M25512 Pain in left shoulder: Secondary | ICD-10-CM | POA: Diagnosis not present

## 2022-06-13 DIAGNOSIS — M5416 Radiculopathy, lumbar region: Secondary | ICD-10-CM | POA: Diagnosis not present

## 2022-06-17 ENCOUNTER — Telehealth: Payer: Self-pay | Admitting: Internal Medicine

## 2022-06-17 NOTE — Telephone Encounter (Signed)
Spoke with pt and she stated that she has been taking the celebrex and omeprazole for about a week. She stated that since taking those she has had some feeling of pressure in her head and lightheadedness.She did not take it yesterday or today and she has felt better. Pt is wanting to know if you think it would be okay for her to take both medications as needed.

## 2022-06-17 NOTE — Telephone Encounter (Signed)
Pt called in asking for Shanda Bumps to give her a call concerning some meds. As per pt, its not about refills. She just wants to share some info with her. Pt would like a call back [phone]

## 2022-06-19 DIAGNOSIS — M47816 Spondylosis without myelopathy or radiculopathy, lumbar region: Secondary | ICD-10-CM | POA: Diagnosis not present

## 2022-06-19 NOTE — Telephone Encounter (Signed)
LMTCB

## 2022-06-19 NOTE — Telephone Encounter (Signed)
Pt is aware of Dr. Melina Schools message and gave a verbal understanding.

## 2022-07-03 DIAGNOSIS — M5416 Radiculopathy, lumbar region: Secondary | ICD-10-CM | POA: Diagnosis not present

## 2022-07-03 DIAGNOSIS — M25512 Pain in left shoulder: Secondary | ICD-10-CM | POA: Diagnosis not present

## 2022-07-09 DIAGNOSIS — M47816 Spondylosis without myelopathy or radiculopathy, lumbar region: Secondary | ICD-10-CM | POA: Diagnosis not present

## 2022-07-18 ENCOUNTER — Ambulatory Visit (INDEPENDENT_AMBULATORY_CARE_PROVIDER_SITE_OTHER): Payer: Medicare HMO | Admitting: Nurse Practitioner

## 2022-07-18 ENCOUNTER — Encounter: Payer: Self-pay | Admitting: Nurse Practitioner

## 2022-07-18 VITALS — BP 128/78 | HR 68 | Temp 98.0°F | Ht 60.0 in | Wt 164.8 lb

## 2022-07-18 DIAGNOSIS — H6121 Impacted cerumen, right ear: Secondary | ICD-10-CM | POA: Insufficient documentation

## 2022-07-18 NOTE — Patient Instructions (Addendum)
Right ear lavage. If have pain in the ear call the office.

## 2022-07-18 NOTE — Assessment & Plan Note (Signed)
Right TM not visible due to cerumen impaction. Ear lavage performed. Patient tolerated well denies dizziness Slight erythema to the right TM without bulging. Advised patient to call the office if she has ear pain.

## 2022-07-18 NOTE — Progress Notes (Signed)
Established Patient Office Visit  Subjective:  Patient ID: Lacey Brown, female    DOB: [birth year]  Age: 82 y.o. MRN: [id number]  CC:  Chief Complaint  Patient presents with   Cerumen Impaction    BOTH    HPI  Lacey Brown presents for cerumen impaction.  She complains of problem hearing.  She states that her ear feels clogged.  HPI   Past Medical History:  Diagnosis Date   Arthritis    back, neck; right shoulder;    Asthma    using advair prn   Chicken pox    Hyperlipidemia    Hypertension    controlled with medication;     Past Surgical History:  Procedure Laterality Date   BREAST EXCISIONAL BIOPSY Left    TONSILLECTOMY AND ADENOIDECTOMY      Family History  Problem Relation Age of Onset   Heart disease Mother    Heart disease Father    Arthritis Maternal Grandmother    Diabetes Neg Hx    Cancer Neg Hx    Stroke Neg Hx     Social History   Socioeconomic History   Marital status: Widowed    Spouse name: Not on file   Number of children: Not on file   Years of education: Not on file   Highest education level: Not on file  Occupational History   Not on file  Tobacco Use   Smoking status: Former    Types: Cigarettes    Quit date: 04/17/2009    Years since quitting: 13.2   Smokeless tobacco: Never  Vaping Use   Vaping Use: Never used  Substance and Sexual Activity   Alcohol use: Yes    Alcohol/week: 0.0 standard drinks of alcohol    Comment: occasional   Drug use: No   Sexual activity: Never  Other Topics Concern   Not on file  Social History Narrative   Not on file   Social Determinants of Health   Financial Resource Strain: Low Risk  (12/10/2021)   Overall Financial Resource Strain (CARDIA)    Difficulty of Paying Living Expenses: Not hard at all  Food Insecurity: No Food Insecurity (12/10/2021)   Hunger Vital Sign    Worried About Running Out of Food in the Last Year: Never true    Ran Out of Food in the Last Year:  Never true  Transportation Needs: No Transportation Needs (12/10/2021)   PRAPARE - Administrator, Civil Service (Medical): No    Lack of Transportation (Non-Medical): No  Physical Activity: Inactive (12/05/2020)   Exercise Vital Sign    Days of Exercise per Week: 0 days    Minutes of Exercise per Session: 0 min  Stress: No Stress Concern Present (12/10/2021)   [employer] of Occupational Health - Occupational Stress Questionnaire    Feeling of Stress : Not at all  Social Connections: Moderately Integrated (12/10/2021)   Social Connection and Isolation Panel [NHANES]    Frequency of Communication with Friends and Family: More than three times a week    Frequency of Social Gatherings with Friends and Family: More than three times a week    Attends Religious Services: More than 4 times per year    Active Member of [employer] or Organizations: Yes    Attends Banker Meetings: 1 to 4 times per year    Marital Status: Widowed  Intimate Partner Violence: Not At Risk (12/10/2021)   Humiliation, Afraid, Rape, and Kick questionnaire  Fear of Current or Ex-Partner: No    Emotionally Abused: No    Physically Abused: No    Sexually Abused: No     Outpatient Medications Prior to Visit  Medication Sig Dispense Refill   acetaminophen (TYLENOL) 325 MG tablet Take 650 mg by mouth every 6 (six) hours as needed.     ADVAIR DISKUS 250-50 MCG/ACT AEPB INHALE 1 PUFF INTO THE LUNGS IN THE MORNING AND AT BEDTIME. 60 each 1   albuterol (VENTOLIN HFA) 108 (90 Base) MCG/ACT inhaler Inhale 2 puffs into the lungs every 6 (six) hours as needed for wheezing or shortness of breath. 3.7 g 11   amLODipine (NORVASC) 2.5 MG tablet TAKE 1 TABLET BY MOUTH EVERY DAY 90 tablet 2   Calcium Carbonate-Vit D-Min (CALCIUM 1200 PO) Take 1 tablet by mouth daily.     celecoxib (CELEBREX) 100 MG capsule Take 1 capsule (100 mg total) by mouth 2 (two) times daily. 60 capsule 2   Cholecalciferol (VITAMIN D3)  2000 UNITS TABS Take 1 tablet by mouth daily.     ergocalciferol (DRISDOL) 1.25 MG (50000 UT) capsule Take 1 capsule (50,000 Units total) by mouth once a week. 12 capsule 0   metoprolol succinate (TOPROL-XL) 25 MG 24 hr tablet Take 1 tablet (25 mg total) by mouth daily. 90 tablet 1   omeprazole (PRILOSEC OTC) 20 MG tablet Take 1 tablet (20 mg total) by mouth daily. In the morning on an empty stomach 90 tablet 1   rosuvastatin (CRESTOR) 10 MG tablet Take 1 tablet (10 mg total) by mouth daily. 90 tablet 1   telmisartan (MICARDIS) 80 MG tablet TAKE 1 TABLET BY MOUTH EVERY DAY 90 tablet 1   No facility-administered medications prior to visit.    Allergies  Allergen Reactions   Ace Inhibitors Cough    Other reaction(s): Cough   Carvedilol     Dizziness    Codeine Nausea Only   Kenalog [Triamcinolone Acetonide] Other (See Comments)    11/2020 post-injection flare after knee injection.  Kenalog preservatives most likely. She should have no issue with oral steroids, or other injectables.  (Ie. Depo-Medrol, Solu-Medrol, Decadron, Celestone, etc.)    ROS Review of Systems Negative unless indicated in HPI.    Objective:    Physical Exam Constitutional:      Appearance: Normal appearance.  HENT:     Right Ear: There is impacted cerumen.     Left Ear: Tympanic membrane normal.     Mouth/Throat:     Mouth: Mucous membranes are moist.  Eyes:     Pupils: Pupils are equal, round, and reactive to light.  Cardiovascular:     Rate and Rhythm: Normal rate and regular rhythm.     Pulses: Normal pulses.     Heart sounds: Normal heart sounds. No murmur heard. Pulmonary:     Effort: Pulmonary effort is normal.     Breath sounds: Normal breath sounds. No stridor. No wheezing.  Skin:    Capillary Refill: Capillary refill takes less than 2 seconds.  Neurological:     General: No focal deficit present.     Mental Status: She is alert and oriented to person, place, and time. Mental status is at  baseline.  Psychiatric:        Mood and Affect: Mood normal.        Behavior: Behavior normal.        Thought Content: Thought content normal.        Judgment: Judgment normal.  BP 128/78   Pulse 68   Temp 98 F (36.7 C) (Oral)   Ht 5' (1.524 m)   Wt 164 lb 12.8 oz (74.8 kg)   SpO2 96%   BMI 32.19 kg/m  Wt Readings from Last 3 Encounters:  07/18/22 164 lb 12.8 oz (74.8 kg)  06/05/22 163 lb 12.8 oz (74.3 kg)  02/28/22 163 lb 12.8 oz (74.3 kg)     Health Maintenance  Topic Date Due   DTaP/Tdap/Td (1 - Tdap) Never done   COVID-19 Vaccine (5 - 2023-24 season) 10/05/2021   INFLUENZA VACCINE  09/05/2022   Medicare Annual Wellness (AWV)  12/11/2022   Pneumonia Vaccine 68+ Years old  Completed   DEXA SCAN  Completed   Zoster Vaccines- Shingrix  Completed   HPV VACCINES  Aged Out    There are no preventive care reminders to display for this patient.  Lab Results  Component Value Date   TSH 2.71 07/25/2021   Lab Results  Component Value Date   WBC 9.4 06/05/2022   HGB 12.3 06/05/2022   HCT 36.5 06/05/2022   MCV 92.8 06/05/2022   PLT 215.0 06/05/2022   Lab Results  Component Value Date   NA 131 (L) 06/05/2022   K 4.6 06/05/2022   CO2 27 06/05/2022   GLUCOSE 91 06/05/2022   BUN 21 06/05/2022   CREATININE 0.79 06/05/2022   BILITOT 0.4 06/05/2022   ALKPHOS 32 (L) 06/05/2022   AST 15 06/05/2022   ALT 16 06/05/2022   PROT 6.1 06/05/2022   ALBUMIN 4.0 06/05/2022   CALCIUM 8.7 06/05/2022   GFR 69.28 06/05/2022   Lab Results  Component Value Date   CHOL 135 06/05/2022   Lab Results  Component Value Date   HDL 75.20 06/05/2022   Lab Results  Component Value Date   LDLCALC 46 06/05/2022   Lab Results  Component Value Date   TRIG 68.0 06/05/2022   Lab Results  Component Value Date   CHOLHDL 2 06/05/2022   Lab Results  Component Value Date   HGBA1C 6.1 (H) 06/06/2022      Assessment & Plan:  Impacted cerumen of right ear Assessment &  Plan: Right TM not visible due to cerumen impaction. Ear lavage performed. Patient tolerated well denies dizziness Slight erythema to the right TM without bulging. Advised patient to call the office if she has ear pain.   Orders: -     Ear Lavage    Follow-up: Return if symptoms worsen or fail to improve.   Kara Dies, NP

## 2022-07-22 ENCOUNTER — Encounter: Payer: Self-pay | Admitting: *Deleted

## 2022-07-22 ENCOUNTER — Telehealth: Payer: Self-pay | Admitting: *Deleted

## 2022-07-22 NOTE — Telephone Encounter (Addendum)
$  301 due for Prolia injection already scheduled on 08/19/22,  PA required; sending to PA team (last [place] PA ended on May 16,2024)

## 2022-07-24 ENCOUNTER — Ambulatory Visit: Payer: Medicare HMO | Admitting: Orthopaedic Surgery

## 2022-07-24 DIAGNOSIS — G8929 Other chronic pain: Secondary | ICD-10-CM

## 2022-07-24 DIAGNOSIS — M25562 Pain in left knee: Secondary | ICD-10-CM | POA: Diagnosis not present

## 2022-07-24 DIAGNOSIS — M25512 Pain in left shoulder: Secondary | ICD-10-CM | POA: Diagnosis not present

## 2022-07-24 MED ORDER — METHYLPREDNISOLONE ACETATE 40 MG/ML IJ SUSP
40.0000 mg | INTRAMUSCULAR | Status: AC | PRN
Start: 2022-07-24 — End: 2022-07-24
  Administered 2022-07-24: 40 mg via INTRA_ARTICULAR

## 2022-07-24 MED ORDER — LIDOCAINE HCL 1 % IJ SOLN
3.0000 mL | INTRAMUSCULAR | Status: AC | PRN
Start: 2022-07-24 — End: 2022-07-24
  Administered 2022-07-24: 3 mL

## 2022-07-24 NOTE — Telephone Encounter (Signed)
Pharmacy Patient Advocate Encounter    PA for Prolia submitted to [facility] via [employer] Authorization Number : [id number] Status is pending

## 2022-07-24 NOTE — Progress Notes (Signed)
The patient is well-known to me.  She is a 82 year old female who we have injected her left shoulder now twice in the subacromial outlet.  She said this for shoulder injection last longer but the second one did not last as long.  She has also tried physical therapy.  The x-rays of her shoulder in the past have shown the humeral head is slightly high riding suggesting chronic rotator cuff tearing.  She said the therapy was really not helpful for her.  It is only been 9 weeks since her last steroid injection in the left shoulder and she would like to have another injection today.  She would also like to have a steroid injection in her left knee.  She understands that after explained to her that it is too early to have a steroid injection in her left shoulder and I do feel that it would be worth her having 1 in a month from now in that shoulder but under ultrasound guidance with my partner Dr. Shon Baton assessing her left shoulder in terms of the rotator cuff and getting an injection deeper in the shoulder.  Her x-rays do not show significant glenohumeral narrowing.  On exam she does have pretty good motion and mobility of the left shoulder but obviously is using some of her deltoids more to abduct and forward flex the shoulder but overall has good function it just hurts.  Her left knee shows varus malalignment with patellofemoral crepitation and medial as well as lateral joint line tenderness.  I did easily place a steroid injection in her left knee today which she tolerated well.  We will send her to Dr. Shon Baton for assessment and ultrasound-guided injection in her left shoulder in 4 weeks.    Procedure Note  Patient: Lacey Brown             Date of Birth: 1940/06/25           MRN: [id number]             Visit Date: 07/24/2022  Procedures: Visit Diagnoses:  1. Chronic left shoulder pain   2. Chronic pain of left knee     Large Joint Inj on 07/24/2022 3:08 PM Indications: diagnostic evaluation  and pain Details: 22 G 1.5 in needle, superolateral approach  Arthrogram: No  Medications: 3 mL lidocaine 1 %; 40 mg methylPREDNISolone acetate 40 MG/ML Outcome: tolerated well, no immediate complications Procedure, treatment alternatives, risks and benefits explained, specific risks discussed. Consent was given by the patient. Immediately prior to procedure a time out was called to verify the correct patient, procedure, equipment, support staff and site/side marked as required. Patient was prepped and draped in the usual sterile fashion.

## 2022-07-30 NOTE — Telephone Encounter (Addendum)
Pt ready for scheduling for PROLIA on or after : 08/19/22  Out-of-pocket cost due at time of visit: $301  Primary: [employer] Prolia co-insurance: 20% Admin fee co-insurance: 20%  Secondary: --- Prolia co-insurance:  Admin fee co-insurance:   Medical Benefit Details: Date Benefits were checked: 06/19/22 Deductible: NO/ Coinsurance: 20%/ Admin Fee: 20%  Prior Auth: APPROVED PA# [id number]  Expiration Date: 07/24/22-07/24/23   Pharmacy benefit: Copay $--- If patient wants fill through the pharmacy benefit please send prescription to: [employer], and include estimated need by date in rx notes. Pharmacy will ship medication directly to the office.  Patient NOT eligible for Prolia Copay Card. Copay Card can make patient's cost as little as $25. Link to apply: [email or web address]://[email or web address]  ** This summary of benefits is an estimation of the patient's out-of-pocket cost. Exact cost may very based on individual plan coverage.

## 2022-07-30 NOTE — Telephone Encounter (Signed)
Prolia approved with [employer].  07/24/22-07/24/23

## 2022-08-01 DIAGNOSIS — M47816 Spondylosis without myelopathy or radiculopathy, lumbar region: Secondary | ICD-10-CM | POA: Diagnosis not present

## 2022-08-01 DIAGNOSIS — Z6832 Body mass index (BMI) 32.0-32.9, adult: Secondary | ICD-10-CM | POA: Diagnosis not present

## 2022-08-01 NOTE — Telephone Encounter (Signed)
Noted, pt aware of amount due & appt scheduled on 08/19/22

## 2022-08-02 ENCOUNTER — Ambulatory Visit
Admission: RE | Admit: 2022-08-02 | Discharge: 2022-08-02 | Disposition: A | Discharge status: Home / Self Care | Payer: Medicare HMO | Source: Ambulatory Visit | Attending: Internal Medicine | Admitting: Internal Medicine

## 2022-08-02 DIAGNOSIS — M8588 Other specified disorders of bone density and structure, other site: Secondary | ICD-10-CM | POA: Diagnosis not present

## 2022-08-02 DIAGNOSIS — Z1231 Encounter for screening mammogram for malignant neoplasm of breast: Secondary | ICD-10-CM

## 2022-08-02 DIAGNOSIS — E349 Endocrine disorder, unspecified: Secondary | ICD-10-CM | POA: Diagnosis not present

## 2022-08-02 DIAGNOSIS — N958 Other specified menopausal and perimenopausal disorders: Secondary | ICD-10-CM | POA: Diagnosis not present

## 2022-08-02 DIAGNOSIS — M810 Age-related osteoporosis without current pathological fracture: Secondary | ICD-10-CM

## 2022-08-07 ENCOUNTER — Other Ambulatory Visit: Payer: Self-pay | Admitting: Internal Medicine

## 2022-08-07 ENCOUNTER — Ambulatory Visit
Admission: RE | Admit: 2022-08-07 | Discharge: 2022-08-07 | Disposition: A | Discharge status: Home / Self Care | Payer: Medicare HMO | Source: Ambulatory Visit | Attending: Internal Medicine | Admitting: Internal Medicine

## 2022-08-07 ENCOUNTER — Telehealth: Payer: Self-pay | Admitting: Internal Medicine

## 2022-08-07 DIAGNOSIS — R928 Other abnormal and inconclusive findings on diagnostic imaging of breast: Secondary | ICD-10-CM

## 2022-08-07 DIAGNOSIS — N6322 Unspecified lump in the left breast, upper inner quadrant: Secondary | ICD-10-CM | POA: Diagnosis not present

## 2022-08-07 DIAGNOSIS — N6320 Unspecified lump in the left breast, unspecified quadrant: Secondary | ICD-10-CM

## 2022-08-07 NOTE — Telephone Encounter (Signed)
Pt called in stating that she would like to speak to [facility] CMA concerning a mammogram.?

## 2022-08-07 NOTE — Telephone Encounter (Signed)
Patient states she is returning call from Sandy Salaam, CMA.  I was unable to reach [place] at the time of the call so I let patient know that I will leave a message for her letting her know she returned her call.

## 2022-08-07 NOTE — Telephone Encounter (Signed)
LMTCB

## 2022-08-09 ENCOUNTER — Telehealth: Payer: Self-pay

## 2022-08-09 NOTE — Telephone Encounter (Signed)
Patient states she is returning our call.  I read message from Dr. Teresa Tullo to patient. 

## 2022-08-09 NOTE — Telephone Encounter (Signed)
-----   Message from Sherlene Shams, MD sent at 08/07/2022  9:49 PM EDT -----  [facility] has reported that your  mammogram was abnormal on the left.  They would like to get additional images, including an ultrasound,  And they will be contacting you directly to schedule those.   Regards,   Duncan Dull, MD

## 2022-08-09 NOTE — Telephone Encounter (Signed)
Please see result note message.  

## 2022-08-09 NOTE — Telephone Encounter (Signed)
noted 

## 2022-08-09 NOTE — Telephone Encounter (Signed)
LMTCB in regards to mammogram results.  ?

## 2022-08-12 ENCOUNTER — Ambulatory Visit
Admission: RE | Admit: 2022-08-12 | Discharge: 2022-08-12 | Disposition: A | Discharge status: Home / Self Care | Payer: Medicare HMO | Source: Ambulatory Visit | Attending: Internal Medicine | Admitting: Internal Medicine

## 2022-08-12 ENCOUNTER — Other Ambulatory Visit: Payer: Self-pay | Admitting: Internal Medicine

## 2022-08-12 DIAGNOSIS — N6320 Unspecified lump in the left breast, unspecified quadrant: Secondary | ICD-10-CM

## 2022-08-12 DIAGNOSIS — R928 Other abnormal and inconclusive findings on diagnostic imaging of breast: Secondary | ICD-10-CM

## 2022-08-12 DIAGNOSIS — N6322 Unspecified lump in the left breast, upper inner quadrant: Secondary | ICD-10-CM

## 2022-08-12 HISTORY — PX: BREAST BIOPSY: SHX20

## 2022-08-13 ENCOUNTER — Other Ambulatory Visit: Payer: Self-pay | Admitting: Internal Medicine

## 2022-08-13 DIAGNOSIS — R928 Other abnormal and inconclusive findings on diagnostic imaging of breast: Secondary | ICD-10-CM

## 2022-08-19 ENCOUNTER — Ambulatory Visit (INDEPENDENT_AMBULATORY_CARE_PROVIDER_SITE_OTHER): Payer: Medicare HMO | Admitting: *Deleted

## 2022-08-19 DIAGNOSIS — M810 Age-related osteoporosis without current pathological fracture: Secondary | ICD-10-CM

## 2022-08-19 MED ORDER — DENOSUMAB 60 MG/ML ~~LOC~~ SOSY
60.0000 mg | PREFILLED_SYRINGE | Freq: Once | SUBCUTANEOUS | Status: AC
Start: 2022-08-19 — End: 2022-08-19
  Administered 2022-08-19: 60 mg via SUBCUTANEOUS

## 2022-08-19 NOTE — Progress Notes (Signed)
Pt received Prolia injection (subcu) in the right arm. Pt tolerated it well with no concerns or complaints.

## 2022-08-20 ENCOUNTER — Other Ambulatory Visit: Payer: Self-pay

## 2022-08-20 ENCOUNTER — Encounter: Payer: Self-pay | Admitting: Sports Medicine

## 2022-08-20 ENCOUNTER — Ambulatory Visit: Payer: Medicare HMO | Admitting: Sports Medicine

## 2022-08-20 DIAGNOSIS — M25512 Pain in left shoulder: Secondary | ICD-10-CM

## 2022-08-20 DIAGNOSIS — G8929 Other chronic pain: Secondary | ICD-10-CM

## 2022-08-20 DIAGNOSIS — M19012 Primary osteoarthritis, left shoulder: Secondary | ICD-10-CM | POA: Diagnosis not present

## 2022-08-20 MED ORDER — BUPIVACAINE HCL 0.25 % IJ SOLN
2.0000 mL | INTRAMUSCULAR | Status: AC | PRN
Start: 2022-08-20 — End: 2022-08-20
  Administered 2022-08-20: 2 mL via INTRA_ARTICULAR

## 2022-08-20 MED ORDER — LIDOCAINE HCL 1 % IJ SOLN
2.0000 mL | INTRAMUSCULAR | Status: AC | PRN
Start: 2022-08-20 — End: 2022-08-20
  Administered 2022-08-20: 2 mL

## 2022-08-20 MED ORDER — METHYLPREDNISOLONE ACETATE 40 MG/ML IJ SUSP
40.0000 mg | INTRAMUSCULAR | Status: AC | PRN
Start: 2022-08-20 — End: 2022-08-20
  Administered 2022-08-20: 40 mg via INTRA_ARTICULAR

## 2022-08-20 NOTE — Progress Notes (Signed)
Lacey Brown - 82 y.o. female MRN [id number]  Date of birth: 1940/10/01  Office Visit Note: Visit Date: 08/20/2022 PCP: Sherlene Shams, MD Referred by: Sherlene Shams, MD  Subjective: Chief Complaint  Patient presents with   Left Shoulder - Pain   HPI: Lacey Brown is a pleasant 82 y.o. female who presents today for acute on chronic left shoulder pain.  Has some chronic left shoulder pain.  She has had 2 previous subacromial joint injections, the first gave her good relief the second 1 did not last very long.  She is also completed formalized physical therapy, still does some home exercises occasionally.  He has a pain deep within the shoulder, also will radiate into the proximal deltoid.  Uses Tylenol arthritis for all of her ailments.  Pertinent ROS were reviewed with the patient and found to be negative unless otherwise specified above in HPI.   Assessment & Plan: Visit Diagnoses:  1. Chronic left shoulder pain   2. Primary osteoarthritis, left shoulder    Plan: Had a discussion with [place] today regarding her acute on chronic shoulder pain.  She did not get much relief from prior subacromial joint injections.  Through shared decision-making, did elect to proceed with ultrasound-guided glenohumeral joint injection.  I did reevaluate her about 5 minutes postinjection and she had excellent relief of pain and improvement in range of motion following this injection, which leads me to believe this is likely coming from the shoulder joint.  She may use ice and/or Tylenol for any postinjection pain.  She may continue her home rehab exercises after about 48 hours of modified activity.  She will follow-up with Dr. Magnus Ivan for her routine follow-up, I am happy to see her back as needed.  Follow-up: Return if symptoms worsen or fail to improve.   Meds & Orders: No orders of the defined types were placed in this encounter.   Orders Placed This Encounter  Procedures   Large  Joint Inj   US Guided Needle Placement - No Linked Charges     Procedures: Large Joint Inj: L glenohumeral on 08/20/2022 1:06 PM Indications: pain Details: 22 G 3.5 in needle, ultrasound-guided posterior approach Medications: 2 mL lidocaine 1 %; 2 mL bupivacaine 0.25 %; 40 mg methylPREDNISolone acetate 40 MG/ML Outcome: tolerated well, no immediate complications  US-guided glenohumeral joint injection, left shoulder After discussion on risks/benefits/indications, informed verbal consent was obtained. A timeout was then performed. The patient was positioned lying lateral recumbent on examination table. [name] shoulder was prepped with betadine and multiple alcohol swabs and utilizing ultrasound guidance, the patient's glenohumeral joint was identified on ultrasound. Using ultrasound guidance a 22-gauge, 3.5 inch needle with a mixture of 2:2:1 cc's lidocaine:bupivicaine:depomedrol was directed from a lateral to medial direction via in-plane technique into the glenohumeral joint with visualization of appropriate spread of injectate into the joint. Patient tolerated the procedure well without immediate complications.      Procedure, treatment alternatives, risks and benefits explained, specific risks discussed. Consent was given by the patient. Immediately prior to procedure a time out was called to verify the correct patient, procedure, equipment, support staff and site/side marked as required. Patient was prepped and draped in the usual sterile fashion.          Clinical History: No specialty comments available.  She reports that she quit smoking about 13 years ago. Her smoking use included cigarettes. She has never used smokeless tobacco.  Recent Labs  06/06/22 1038  HGBA1C 6.1*    Objective:   Vital Signs: There were no vitals taken for this visit.  Physical Exam  Gen: Well-appearing, in no acute distress; non-toxic CV: Well-perfused. Warm.  Resp: Breathing unlabored on  room air; no wheezing. Psych: Fluid speech in conversation; appropriate affect; normal thought process Neuro: Sensation intact throughout. No gross coordination deficits.   Ortho Exam - Left shoulder: No AC joint TTP or bony TTP.  There are no blocks to passive range of motion but there is some weakness with resisted abduction and does have to use scapular compensation for forward flexion and abduction.  No swelling, redness or effusion noted.  Imaging:  XR Left shoulder 05/22/22: 3 views of the left shoulder show no acute findings.  The humeral head is  slightly high riding suggesting chronic rotator cuff pathology.  The  glenohumeral joint is well-maintained as well as the [place] joint.   Past Medical/Family/Surgical/Social History: Medications & Allergies reviewed per EMR, new medications updated. Patient Active Problem List   Diagnosis Date Noted   Impacted cerumen of right ear 07/18/2022   Vitamin D deficiency 06/06/2022   Anorexia 06/05/2022   Chronic constipation 03/02/2022   Bilateral hearing loss due to cerumen impaction 12/25/2021   Pure hypercholesterolemia 11/28/2021   Split spinal cord malformation (HCC) 08/01/2021   Chronic hyponatremia 08/01/2021   Chronic left-sided low back pain 07/05/2021   Chronic left shoulder pain 07/05/2021   Edema of left lower extremity 02/19/2021   Pain of left great toe 02/19/2021   Aortic atherosclerosis (HCC) 08/02/2020   Osteoarthritis of knees, bilateral 08/02/2020   Osteoporosis 08/02/2020   Left knee pain 11/17/2019   Arthritis 10/24/2014   HTN (hypertension) 10/24/2014   COPD (chronic obstructive pulmonary disease) (HCC) 10/24/2014   GERD (gastroesophageal reflux disease) 10/24/2014   Past Medical History:  Diagnosis Date   Arthritis    back, neck; right shoulder;    Asthma    using advair prn   Chicken pox    Hyperlipidemia    Hypertension    controlled with medication;    Family History  Problem Relation Age of Onset    Heart disease Mother    Heart disease Father    Arthritis Maternal Grandmother    Diabetes Neg Hx    Cancer Neg Hx    Stroke Neg Hx    Past Surgical History:  Procedure Laterality Date   BREAST BIOPSY Left 08/12/2022   [place] LT BREAST BX W LOC DEV 1ST LESION IMG BX SPEC US GUIDE 08/12/2022 [facility]   BREAST EXCISIONAL BIOPSY Left    TONSILLECTOMY AND ADENOIDECTOMY     Social History   Occupational History   Not on file  Tobacco Use   Smoking status: Former    Current packs/day: 0.00    Types: Cigarettes    Quit date: 04/17/2009    Years since quitting: 13.3   Smokeless tobacco: Never  Vaping Use   Vaping status: Never Used  Substance and Sexual Activity   Alcohol use: Yes    Alcohol/week: 0.0 standard drinks of alcohol    Comment: occasional   Drug use: No   Sexual activity: Never

## 2022-08-20 NOTE — Progress Notes (Signed)
Left shoulder; Magnus Ivan referral Has had injections in the past They do typically help when she gets them She is not diabetic

## 2022-08-21 ENCOUNTER — Ambulatory Visit: Payer: Medicare HMO | Admitting: Orthopaedic Surgery

## 2022-08-21 ENCOUNTER — Telehealth: Payer: Self-pay

## 2022-08-21 NOTE — Telephone Encounter (Signed)
Patient called stating that she received an injection in her left shoulder yesterday, but shoulder is still bothering her and would like to know how long does it take for medication to help and what medication was used?  Stated that she is flushed and wanted to know was that normal?  CB# [phone].  Please advise.  Thank you.

## 2022-08-21 NOTE — Telephone Encounter (Signed)
Spoke with patient  Informed her that the injection can take up to 2 weeks Explained the "flushed" feeling is normal

## 2022-08-22 ENCOUNTER — Ambulatory Visit: Payer: Medicare HMO | Admitting: Sports Medicine

## 2022-08-23 ENCOUNTER — Other Ambulatory Visit: Payer: Self-pay | Admitting: Internal Medicine

## 2022-08-23 DIAGNOSIS — Z170 Estrogen receptor positive status [ER+]: Secondary | ICD-10-CM | POA: Diagnosis not present

## 2022-08-23 DIAGNOSIS — C50212 Malignant neoplasm of upper-inner quadrant of left female breast: Secondary | ICD-10-CM | POA: Diagnosis not present

## 2022-08-23 NOTE — Telephone Encounter (Signed)
Refilled: 06/06/2022 Last OV: 07/18/2022 Next OV: 116/2024 Last vitamin D lab: 06/05/2022

## 2022-08-26 ENCOUNTER — Telehealth: Payer: Self-pay | Admitting: Radiation Oncology

## 2022-08-26 NOTE — Telephone Encounter (Signed)
Called patient to schedule a consultation w. Dr. Basilio Cairo. Appointment delay due to patient requesting afternoon appointment.

## 2022-08-28 NOTE — Progress Notes (Signed)
Location of Breast Cancer: Malignant neoplasm of upper-inner quadrant of left breast in female, estrogen receptor positive   Histology per Pathology Report:    Receptor Status: ER(95%), PR (100%), Her2-neu (neg), Ki-67(5%)  Did patient present with symptoms (if so, please note symptoms) or was this found on screening mammography?: screening mammogram  Past/Anticipated interventions by surgeon, if any: Matthias Hughs, MD - 08/23/2022  History of Present Illness: Lacey Brown is a 82 y.o. female who is seen today as an office consultation at the request of Dr. Darrick Huntsman for evaluation of NEW BREAST CANCER  .   Pt presents with new diagnosis of left breast cancer 08/2022. She had screening detected possible left breast mass. Diagnostic imaging was performed. This showed a 6 mm mass at 10 o'clock on the left. There were questionable satellite masses x 2 measuring 4 mm. The main mass was biopsied and path showed a grade 2 invasive mammary carcinoma (e cadherin not done due to insufficient tumor). Prognostic panel was strongly ER and PR +, her 2 negative, Ki 67 5%.   Pt hasn't had prior cancer. She denies family cancer history.   She is retired, but enjoys gardening on her patio. She is accompanied by a fried of 60 years.   Assessment and Plan:   ICD-10-CM  1. Malignant neoplasm of upper-inner quadrant of left breast in female, estrogen receptor positive (CMS/HHS-HCC) C50.212 Ambulatory Referral to Oncology-Medical  Z17.0 Ambulatory Referral to Radiation Oncology    Pt has new diagnosis of cT1bN0 left breast cancer. She has additional biopsies set up next week for the satellite nodules. Either way, we can perform breast conserving surgery. I will need to do bracketed lumpectomy if these are positive, or single seed if negative. I do not think she needs a SLN bx given age and prognostic panel.   I will refer to medical and radiation oncology. She is likely to be recommended one or the  other, but they may recommend both depending on final pathology.  I reviewed seed localized lumpectomy with patient and friend.  I discussed the incision type and location and that we will need radiology involved with 1-2 seed markers.  Past/Anticipated interventions by medical oncology, if any: Pt to see Dr. Pamelia Hoit on 09-11-22.   Lymphedema issues, if any:  none to report  Pain issues, if any:  reports tenderness at biopsy site, using ice and tylenol to area  SAFETY ISSUES: Prior radiation? no Pacemaker/ICD? no Possible current pregnancy?no Is the patient on methotrexate? no  Current Complaints / other details:  Pt doing well overall. She is concerned about potentially coming everyday for radiation. She lives close to [facility] as well. She has no major questions at this time. She has surgery planned for August 15th with Dr. Donell Beers.

## 2022-08-29 ENCOUNTER — Other Ambulatory Visit: Payer: Self-pay | Admitting: General Surgery

## 2022-08-29 DIAGNOSIS — Z170 Estrogen receptor positive status [ER+]: Secondary | ICD-10-CM

## 2022-08-30 ENCOUNTER — Other Ambulatory Visit: Payer: Self-pay | Admitting: General Surgery

## 2022-08-30 ENCOUNTER — Ambulatory Visit
Admission: RE | Admit: 2022-08-30 | Discharge: 2022-08-30 | Disposition: A | Discharge status: Home / Self Care | Payer: Medicare HMO | Source: Ambulatory Visit | Attending: Internal Medicine | Admitting: Internal Medicine

## 2022-08-30 DIAGNOSIS — C50212 Malignant neoplasm of upper-inner quadrant of left female breast: Secondary | ICD-10-CM

## 2022-08-30 DIAGNOSIS — N6322 Unspecified lump in the left breast, upper inner quadrant: Secondary | ICD-10-CM | POA: Diagnosis not present

## 2022-08-30 DIAGNOSIS — R928 Other abnormal and inconclusive findings on diagnostic imaging of breast: Secondary | ICD-10-CM

## 2022-08-30 HISTORY — PX: BREAST BIOPSY: SHX20

## 2022-09-02 ENCOUNTER — Telehealth: Payer: Self-pay

## 2022-09-02 ENCOUNTER — Encounter: Payer: Self-pay | Admitting: Radiation Oncology

## 2022-09-02 NOTE — Telephone Encounter (Signed)
RN called pt for meaningful use and nurse evaluation information. Note completed and routed to Dr. Basilio Cairo and Jannette Spanner PA-C for review.

## 2022-09-02 NOTE — Telephone Encounter (Signed)
RN called and left message for pt with call back information. Rn was attempting to reach pt to obtain pre consult information. Rn will attempt to call back later.

## 2022-09-02 NOTE — Progress Notes (Signed)
Radiation Oncology         [phone] ________________________________  Initial Outpatient Consultation  Name: Lacey Brown MRN: [id number]  Date: 09/03/2022  DOB: 01-29-1941  JX:[id number], Mar Daring, MD  Almond Lint, MD   REFERRING PHYSICIAN: Almond Lint, MD  DIAGNOSIS: No diagnosis found.   Cancer Staging  No matching staging information was found for the patient.  Stage *** Left Breast UIQ, Invasive ductal/mammary carcinoma with mucinous features, and low-grade DCIS, ER+ / PR+ / Her2-, Grade 2 (consisting of 2 masses in the 10 o'clock left breast located 3 cmfn and 4 cmfn).  CHIEF COMPLAINT: Here to discuss management of left breast cancer  HISTORY OF PRESENT ILLNESS::Lacey Brown is a 82 y.o. female who presented with a left breast abnormality on the following imaging: bilateral screening mammogram on the date of 08/02/22. No symptoms, if any, were reported at that time. Left breast diagnostic mammogram and left breast ultrasound on 08/07/22 further revealed: an indeterminate mass in the 10 o'clock left breast measuring 6 mm. No evidence of left axillary lymphadenopathy was appreciated.   Biopsy if the 10 o'clock left breast mass 3 cmfn on date of 08/12/22 showed grade 2 invasive mammary carcinoma with mucinous features measuring 5 mm in the greatest linear extent of the sample .  ER status: 95% positive and PR status 100% positive, both with strong staining intensity; Proliferation marker Ki67 at 5%; Her2 status negative; Grade 2. No lymph nodes were examined.   -- E-cadherin analysis was not performed due to insufficient tumor   Imaging performed in preparation for the above biopsy revealed a smaller adjacent mass in the 10 o'clock left breast, 4 cmfn, measuring 4 x 2 x 3 mm, located approximately 1.4 cm away from the larger mass.  Subsequently, the patient underwent biopsies of the additional mass in the 10 o'clock left breast, 4 cmfn, on 08/30/22. Pathology from  biopsy of the upper inner posterior portion of the mass revealed grade 2 invasive ductal carcinoma with mucinous features measuring 4 mm in the greatest linear extent of the sample with low grade DCIS. Biopsy of the upper inner anterior portion of mass showed now invasive disease and findings consistent with ADH involving a small intraductal papilloma/radial sclerosing lesion.   She has been seen in consultation by Dr. Donell Beers and has opted to proceed with breast conserving surgery. Since her biopsy of the additional mass in the 10 o'clock left breast came back positive, she will likely proceed with a bracketed lumpectomy. Based on her advanced age and prognostics, Dr. Donell Beers will also omit SLN evaluation. Her procedure is currently scheduled for 09/19/22.   ***  PREVIOUS RADIATION THERAPY: No  PAST MEDICAL HISTORY:  has a past medical history of Arthritis, Asthma, Chicken pox, Hyperlipidemia, and Hypertension.    PAST SURGICAL HISTORY: Past Surgical History:  Procedure Laterality Date   BREAST BIOPSY Left 08/12/2022   [place] LT BREAST BX W LOC DEV 1ST LESION IMG BX SPEC US GUIDE 08/12/2022 [facility]   BREAST BIOPSY Left 08/30/2022   MM LT BREAST BX W LOC DEV EA AD LESION IMG BX SPEC STEREO GUIDE 08/30/2022 [facility]   BREAST BIOPSY Left 08/30/2022   MM LT BREAST BX W LOC DEV 1ST LESION IMAGE BX SPEC STEREO GUIDE 08/30/2022 [facility]   BREAST EXCISIONAL BIOPSY Left    TONSILLECTOMY AND ADENOIDECTOMY      FAMILY HISTORY: family history includes Arthritis in her maternal grandmother; Heart disease in her father and mother.  SOCIAL HISTORY:  reports that she quit smoking about 13 years ago. Her smoking use included cigarettes. She has never used smokeless tobacco. She reports current alcohol use. She reports that she does not use drugs.  ALLERGIES: Ace inhibitors, Carvedilol, Codeine, and Kenalog [triamcinolone acetonide]  MEDICATIONS:  Current Outpatient Medications   Medication Sig Dispense Refill   acetaminophen (TYLENOL) 325 MG tablet Take 650 mg by mouth every 6 (six) hours as needed.     ADVAIR DISKUS 250-50 MCG/ACT AEPB INHALE 1 PUFF INTO THE LUNGS IN THE MORNING AND AT BEDTIME. 60 each 1   albuterol (VENTOLIN HFA) 108 (90 Base) MCG/ACT inhaler Inhale 2 puffs into the lungs every 6 (six) hours as needed for wheezing or shortness of breath. 3.7 g 11   amLODipine (NORVASC) 2.5 MG tablet TAKE 1 TABLET BY MOUTH EVERY DAY 90 tablet 2   Calcium Carbonate-Vit D-Min (CALCIUM 1200 PO) Take 1 tablet by mouth daily.     celecoxib (CELEBREX) 100 MG capsule Take 1 capsule (100 mg total) by mouth 2 (two) times daily. 60 capsule 2   Cholecalciferol (VITAMIN D3) 2000 UNITS TABS Take 1 tablet by mouth daily.     metoprolol succinate (TOPROL-XL) 25 MG 24 hr tablet Take 1 tablet (25 mg total) by mouth daily. 90 tablet 1   omeprazole (PRILOSEC OTC) 20 MG tablet Take 1 tablet (20 mg total) by mouth daily. In the morning on an empty stomach 90 tablet 1   rosuvastatin (CRESTOR) 10 MG tablet Take 1 tablet (10 mg total) by mouth daily. 90 tablet 1   telmisartan (MICARDIS) 80 MG tablet TAKE 1 TABLET BY MOUTH EVERY DAY 90 tablet 1   Vitamin D, Ergocalciferol, (DRISDOL) 1.25 MG (50000 UNIT) CAPS capsule TAKE 1 CAPSULE BY MOUTH ONE TIME PER WEEK 12 capsule 0   No current facility-administered medications for this encounter.    REVIEW OF SYSTEMS: As above in HPI.   PHYSICAL EXAM:  vitals were not taken for this visit.   General: Alert and oriented, in no acute distress HEENT: Head is normocephalic. Extraocular movements are intact. Oropharynx is clear. Neck: Neck is supple, no palpable cervical or supraclavicular lymphadenopathy. Heart: Regular in rate and rhythm with no murmurs, rubs, or gallops. Chest: Clear to auscultation bilaterally, with no rhonchi, wheezes, or rales. Abdomen: Soft, nontender, nondistended, with no rigidity or guarding. Extremities: No cyanosis or  edema. Lymphatics: see Neck Exam Skin: No concerning lesions. Musculoskeletal: symmetric strength and muscle tone throughout. Neurologic: Cranial nerves II through XII are grossly intact. No obvious focalities. Speech is fluent. Coordination is intact. Psychiatric: Judgment and insight are intact. Affect is appropriate. Breasts: *** . No other palpable masses appreciated in the breasts or axillae *** .    ECOG = ***  0 - Asymptomatic (Fully active, able to carry on all predisease activities without restriction)  1 - Symptomatic but completely ambulatory (Restricted in physically strenuous activity but ambulatory and able to carry out work of a light or sedentary nature. For example, light housework, office work)  2 - Symptomatic, <50% in bed during the day (Ambulatory and capable of all self care but unable to carry out any work activities. Up and about more than 50% of waking hours)  3 - Symptomatic, >50% in bed, but not bedbound (Capable of only limited self-care, confined to bed or chair 50% or more of waking hours)  4 - Bedbound (Completely disabled. Cannot carry on any self-care. Totally confined to bed or chair)  5 -  Death   Santiago Glad MM, Creech RH, Tormey DC, et al. [phone]). "Toxicity and response criteria of the [facility]". Am. Evlyn Clines. Oncol. 5 (6): 649-55   LABORATORY DATA:  Lab Results  Component Value Date   WBC 9.4 06/05/2022   HGB 12.3 06/05/2022   HCT 36.5 06/05/2022   MCV 92.8 06/05/2022   PLT 215.0 06/05/2022   CMP     Component Value Date/Time   NA 131 (L) 06/05/2022 1409   K 4.6 06/05/2022 1409   CL 97 06/05/2022 1409   CO2 27 06/05/2022 1409   GLUCOSE 91 06/05/2022 1409   BUN 21 06/05/2022 1409   CREATININE 0.79 06/05/2022 1409   CALCIUM 8.7 06/05/2022 1409   PROT 6.1 06/05/2022 1409   ALBUMIN 4.0 06/05/2022 1409   AST 15 06/05/2022 1409   ALT 16 06/05/2022 1409   ALKPHOS 32 (L) 06/05/2022 1409   BILITOT 0.4 06/05/2022 1409          RADIOGRAPHY: MM CLIP PLACEMENT LEFT  Result Date: 08/30/2022 CLINICAL DATA:  Post biopsy mammogram of the left breast for clip placement. EXAM: 3D DIAGNOSTIC LEFT MAMMOGRAM POST STEREOTACTIC BIOPSY COMPARISON:  Previous exam(s). FINDINGS: 3D Mammographic images were obtained following stereotactic guided biopsy of 2 masses in the upper inner left breast. The biopsy marking clips are in expected position at the sites of biopsy. The coil and the ribbon shaped biopsy marking clips, which are the 2 most distant clips, are 3.9 cm apart. IMPRESSION: 1. Appropriate positioning of the X shaped biopsy marking clip at the site of biopsy in the upper inner anterior left breast. 2. Appropriate positioning of the ribbon shaped biopsy marking clip at the site of biopsy in the upper inner posterior left breast. Final Assessment: Post Procedure Mammograms for Marker Placement Electronically Signed   By: Frederico Hamman M.D.   On: 08/30/2022 13:30   MM LT BREAST BX W LOC DEV 1ST LESION IMAGE BX SPEC STEREO GUIDE  Result Date: 08/30/2022 CLINICAL DATA:  82 year old female with recent diagnosis of left breast cancer following ultrasound-guided biopsy of a left breast mass. She presents today for stereotactic biopsy of 2 additional masses EXAM: BREAST STEREOTACTIC CORE NEEDLE BIOPSY COMPARISON:  Previous exam(s). FINDINGS: The patient and I discussed the procedure of stereotactic-guided biopsy including benefits and alternatives. We discussed the high likelihood of a successful procedure. We discussed the risks of the procedure including infection, bleeding, tissue injury, clip migration, and inadequate sampling. Informed written consent was given. The usual time out protocol was performed immediately prior to the procedure. Using sterile technique and 1% Lidocaine as local anesthetic, under stereotactic guidance, a 9 gauge vacuum assisted device was used to perform core needle biopsy of a 5 mm mass anterior to the  coil shaped biopsy marking clip in the upper inner left breast using a superior approach. Lesion quadrant: Upper inner quadrant At the conclusion of the procedure, an X shaped tissue marker clip was deployed into the biopsy cavity. Using sterile technique and 1% Lidocaine as local anesthetic, under stereotactic guidance, a 9 gauge vacuum assisted device was used to perform core needle biopsy of the 5 mm mass posterior to the coil shaped biopsy marking clip in the upper inner left breast using a superior approach. Specimen radiograph was performed showing upper inner quadrant. Specimens with calcifications are identified for pathology. Lesion quadrant: Upper inner quadrant At the conclusion of the procedure, a ribbon shaped tissue marker clip was deployed into the biopsy cavity. Follow-up  2-view mammogram was performed and dictated separately. IMPRESSION: 1. Stereotactic-guided biopsy of small masses anterior (X clip), and posterior (ribbon clip), to the coil shaped biopsy marking clip at the site of the known cancer in the upper inner left breast. No apparent complications. Electronically Signed   By: Frederico Hamman M.D.   On: 08/30/2022 13:21   MM LT BREAST BX W LOC DEV EA AD LESION IMG BX SPEC STEREO GUIDE  Result Date: 08/30/2022 CLINICAL DATA:  82 year old female with recent diagnosis of left breast cancer following ultrasound-guided biopsy of a left breast mass. She presents today for stereotactic biopsy of 2 additional masses EXAM: BREAST STEREOTACTIC CORE NEEDLE BIOPSY COMPARISON:  Previous exam(s). FINDINGS: The patient and I discussed the procedure of stereotactic-guided biopsy including benefits and alternatives. We discussed the high likelihood of a successful procedure. We discussed the risks of the procedure including infection, bleeding, tissue injury, clip migration, and inadequate sampling. Informed written consent was given. The usual time out protocol was performed immediately prior to the  procedure. Using sterile technique and 1% Lidocaine as local anesthetic, under stereotactic guidance, a 9 gauge vacuum assisted device was used to perform core needle biopsy of a 5 mm mass anterior to the coil shaped biopsy marking clip in the upper inner left breast using a superior approach. Lesion quadrant: Upper inner quadrant At the conclusion of the procedure, an X shaped tissue marker clip was deployed into the biopsy cavity. Using sterile technique and 1% Lidocaine as local anesthetic, under stereotactic guidance, a 9 gauge vacuum assisted device was used to perform core needle biopsy of the 5 mm mass posterior to the coil shaped biopsy marking clip in the upper inner left breast using a superior approach. Specimen radiograph was performed showing upper inner quadrant. Specimens with calcifications are identified for pathology. Lesion quadrant: Upper inner quadrant At the conclusion of the procedure, a ribbon shaped tissue marker clip was deployed into the biopsy cavity. Follow-up 2-view mammogram was performed and dictated separately. IMPRESSION: 1. Stereotactic-guided biopsy of small masses anterior (X clip), and posterior (ribbon clip), to the coil shaped biopsy marking clip at the site of the known cancer in the upper inner left breast. No apparent complications. Electronically Signed   By: Frederico Hamman M.D.   On: 08/30/2022 13:21   [place] LT BREAST BX W LOC DEV 1ST LESION IMG BX SPEC US GUIDE  Addendum Date: 08/21/2022   ADDENDUM REPORT: 08/21/2022 08:21 ADDENDUM: Patient is returning for stereotactic biopsy of 2 additional lesions in the LEFT breast. The known malignancy and the 2 lesions span a total of 3.8 centimeters on the craniocaudal projection. Electronically Signed   By: Norva Pavlov M.D.   On: 08/21/2022 08:21   Addendum Date: 08/13/2022   ADDENDUM REPORT: 08/13/2022 15:40 ADDENDUM: Pathology revealed GRADE II INVASIVE MAMMARY CARCINOMA WITH MUCINOUS FEATURES (CLASSIFICATION BEST  MADE ON AN EXCISIONAL SPECIMEN of the LEFT breast, 10 o'clock, 3 cmfn, (coil clip). This was found to be concordant by Dr. Frederico Hamman. Pathology results were discussed with the patient by telephone. The patient reported doing well after the biopsy with tenderness at the site. Post biopsy instructions and care were reviewed and questions were answered. The patient was encouraged to call [facility] for any additional concerns. My direct phone number was provided. Surgical consultation has been arranged with Dr. Almond Lint at [facility] on August 21, 2022. The patient is scheduled for a LEFT breast stereotactic guided biopsy x  2 on August 30, 2022. Further recommendations will be guided by the results of this biopsy. Pathology results reported by Rene Kocher, RN on 08/13/2022. Electronically Signed   By: Frederico Hamman M.D.   On: 08/13/2022 15:40   Result Date: 08/21/2022 CLINICAL DATA:  82 year old female presenting for ultrasound-guided biopsy of a left breast mass. EXAM: ULTRASOUND GUIDED LEFT BREAST CORE NEEDLE BIOPSY COMPARISON:  Previous exam(s). PROCEDURE: I met with the patient and we discussed the procedure of ultrasound-guided biopsy, including benefits and alternatives. We discussed the high likelihood of a successful procedure. We discussed the risks of the procedure, including infection, bleeding, tissue injury, clip migration, and inadequate sampling. Informed written consent was given. The usual time-out protocol was performed immediately prior to the procedure. Lesion quadrant: Upper inner quadrant In scanning in preparation for biopsy, a smaller adjacent mass was seen in the left breast at 10 o'clock, 4 cm from the nipple which is taller than wide. This mass measures 4 x 2 x 3 mm, and is 1.4 cm away from the larger mass. Using sterile technique and 1% Lidocaine as local anesthetic, under direct ultrasound visualization, a 14 gauge spring-loaded device  was used to perform biopsy of a mass in the left breast at 10 o'clock, 3 cm from the nipple using a medial approach. At the conclusion of the procedure a coil shaped tissue marker clip was deployed into the biopsy cavity. Follow up 2 view mammogram was performed and dictated separately. IMPRESSION: 1. Ultrasound guided biopsy of a left breast mass at 10 o'clock. No apparent complications. 2. A smaller mass was seen in the left breast at 10 o'clock, 4 cm from the nipple, about 1.4 cm away from the larger mass, prior to biopsy. It could not be seen after biopsy of the first lesion because it was obscured by lidocaine. The patient will need to return for biopsy of the second site. Electronically Signed: By: Frederico Hamman M.D. On: 08/12/2022 14:47   MM CLIP PLACEMENT LEFT  Addendum Date: 08/21/2022   ADDENDUM REPORT: 08/21/2022 07:46 ADDENDUM: Patient is returning for stereotactic biopsy of 2 additional lesions in the LEFT breast. The known malignancy and the 2 lesions span a total of 3.8 centimeters on the craniocaudal projection. Electronically Signed   By: Norva Pavlov M.D.   On: 08/21/2022 07:46   Result Date: 08/21/2022 CLINICAL DATA:  Post biopsy mammogram of the left breast for clip placement. EXAM: 3D DIAGNOSTIC LEFT MAMMOGRAM POST ULTRASOUND BIOPSY COMPARISON:  Previous exam(s). FINDINGS: 3D Mammographic images were obtained following ultrasound guided biopsy of a mass in the left breast at 10 o'clock. The biopsy marking clip is in expected position at the site of biopsy. IMPRESSION: Appropriate positioning of the coil shaped biopsy marking clip at the site of biopsy in the upper inner left breast. Final Assessment: Post Procedure Mammograms for Marker Placement Electronically Signed: By: Frederico Hamman M.D. On: 08/12/2022 15:03   MM 3D DIAGNOSTIC MAMMOGRAM UNILATERAL LEFT BREAST  Result Date: 08/07/2022 CLINICAL DATA:  Recall from screening for a possible left breast mass. EXAM: DIGITAL  DIAGNOSTIC UNILATERAL LEFT MAMMOGRAM WITH TOMOSYNTHESIS AND CAD; ULTRASOUND LEFT BREAST LIMITED TECHNIQUE: Left digital diagnostic mammography and breast tomosynthesis was performed. The images were evaluated with computer-aided detection. ; Targeted ultrasound examination of the left breast was performed. COMPARISON:  Previous exam(s). ACR Breast Density Category c: The breasts are heterogeneously dense, which may obscure small masses. FINDINGS: On spot compression imaging, the possible mass noted in the upper inner  left breast on the current screening exam persists as a small, 5 mm, oval circumscribed mass. Targeted left breast ultrasound is performed, showing a small oval hypoechoic mass, which may be intraductal, at 10 o'clock, 3 cm from the nipple, measuring 6 x 4 x 5 mm, consistent in size, shape and location to the mammographic mass. Left axilla ultrasound demonstrates normal lymph nodes. No enlarged or abnormal lymph nodes. IMPRESSION: 1. Indeterminate, 6 mm mass in the left breast at 10 o'clock. Tissue sampling is recommended. RECOMMENDATION: 1. Ultrasound-guided core needle biopsy of the 6 mm mass in the left breast at 10 o'clock. This procedure was scheduled prior to the patient being discharged from the [facility]. I have discussed the findings and recommendations with the patient. If applicable, a reminder letter will be sent to the patient regarding the next appointment. BI-RADS CATEGORY  4: Suspicious. Electronically Signed   By: Amie Portland M.D.   On: 08/07/2022 14:15  [place] LIMITED ULTRASOUND INCLUDING AXILLA LEFT BREAST   Result Date: 08/07/2022 CLINICAL DATA:  Recall from screening for a possible left breast mass. EXAM: DIGITAL DIAGNOSTIC UNILATERAL LEFT MAMMOGRAM WITH TOMOSYNTHESIS AND CAD; ULTRASOUND LEFT BREAST LIMITED TECHNIQUE: Left digital diagnostic mammography and breast tomosynthesis was performed. The images were evaluated with computer-aided detection. ; Targeted ultrasound  examination of the left breast was performed. COMPARISON:  Previous exam(s). ACR Breast Density Category c: The breasts are heterogeneously dense, which may obscure small masses. FINDINGS: On spot compression imaging, the possible mass noted in the upper inner left breast on the current screening exam persists as a small, 5 mm, oval circumscribed mass. Targeted left breast ultrasound is performed, showing a small oval hypoechoic mass, which may be intraductal, at 10 o'clock, 3 cm from the nipple, measuring 6 x 4 x 5 mm, consistent in size, shape and location to the mammographic mass. Left axilla ultrasound demonstrates normal lymph nodes. No enlarged or abnormal lymph nodes. IMPRESSION: 1. Indeterminate, 6 mm mass in the left breast at 10 o'clock. Tissue sampling is recommended. RECOMMENDATION: 1. Ultrasound-guided core needle biopsy of the 6 mm mass in the left breast at 10 o'clock. This procedure was scheduled prior to the patient being discharged from the [facility] Center. I have discussed the findings and recommendations with the patient. If applicable, a reminder letter will be sent to the patient regarding the next appointment. BI-RADS CATEGORY  4: Suspicious. Electronically Signed   By: Amie Portland M.D.   On: 08/07/2022 14:15     IMPRESSION/PLAN: ***   It was a pleasure meeting the patient today. We discussed the risks, benefits, and side effects of radiotherapy. I recommend radiotherapy to the *** to reduce her risk of locoregional recurrence by 2/3.  We discussed that radiation would take approximately *** weeks to complete and that I would give the patient a few weeks to heal following surgery before starting treatment planning. *** If chemotherapy were to be given, this would precede radiotherapy. We spoke about acute effects including skin irritation and fatigue as well as much less common late effects including internal organ injury or irritation. We spoke about the latest technology that is used to  minimize the risk of late effects for patients undergoing radiotherapy to the breast or chest wall. No guarantees of treatment were given. The patient is enthusiastic about proceeding with treatment. I look forward to participating in the patient's care.  I will await her referral back to me for postoperative follow-up and eventual CT simulation/treatment planning.  On  date of service, in total, I spent *** minutes on this encounter. Patient was seen in person.   __________________________________________   Lonie Peak, MD  This document serves as a record of services personally performed by Lonie Peak, MD. It was created on her behalf by Neena Rhymes, a trained medical scribe. The creation of this record is based on the scribe's personal observations and the provider's statements to them. This document has been checked and approved by the attending provider.

## 2022-09-03 ENCOUNTER — Other Ambulatory Visit: Payer: Self-pay

## 2022-09-03 ENCOUNTER — Ambulatory Visit
Admission: RE | Admit: 2022-09-03 | Discharge: 2022-09-03 | Disposition: A | Discharge status: Home / Self Care | Payer: Medicare HMO | Source: Ambulatory Visit | Attending: Radiation Oncology | Admitting: Radiation Oncology

## 2022-09-03 ENCOUNTER — Encounter: Payer: Self-pay | Admitting: Radiation Oncology

## 2022-09-03 ENCOUNTER — Telehealth: Payer: Self-pay | Admitting: Internal Medicine

## 2022-09-03 VITALS — BP 161/89 | HR 93 | Temp 97.5°F | Resp 18 | Ht 60.0 in | Wt 163.2 lb

## 2022-09-03 DIAGNOSIS — J45909 Unspecified asthma, uncomplicated: Secondary | ICD-10-CM | POA: Insufficient documentation

## 2022-09-03 DIAGNOSIS — E785 Hyperlipidemia, unspecified: Secondary | ICD-10-CM | POA: Diagnosis not present

## 2022-09-03 DIAGNOSIS — Z87891 Personal history of nicotine dependence: Secondary | ICD-10-CM | POA: Insufficient documentation

## 2022-09-03 DIAGNOSIS — M129 Arthropathy, unspecified: Secondary | ICD-10-CM | POA: Diagnosis not present

## 2022-09-03 DIAGNOSIS — Z79899 Other long term (current) drug therapy: Secondary | ICD-10-CM | POA: Insufficient documentation

## 2022-09-03 DIAGNOSIS — C50212 Malignant neoplasm of upper-inner quadrant of left female breast: Secondary | ICD-10-CM | POA: Insufficient documentation

## 2022-09-03 DIAGNOSIS — Z170 Estrogen receptor positive status [ER+]: Secondary | ICD-10-CM

## 2022-09-03 DIAGNOSIS — I10 Essential (primary) hypertension: Secondary | ICD-10-CM | POA: Insufficient documentation

## 2022-09-03 DIAGNOSIS — Z791 Long term (current) use of non-steroidal anti-inflammatories (NSAID): Secondary | ICD-10-CM | POA: Diagnosis not present

## 2022-09-03 NOTE — Telephone Encounter (Signed)
LMTCB

## 2022-09-03 NOTE — Telephone Encounter (Signed)
Please schedule a visit for Lacey Brown ON THURSDAY MORNING TO REVIEW HER RECENT BREAST BIOPSY. I can't tell from the chart whether she HAS BEEN  notified of the results and  I can't access them for a definitive plan

## 2022-09-04 NOTE — Telephone Encounter (Signed)
Patient returned office phone call and note from Dr Darrick Huntsman read. Patient stated that the [facility] Center has gone over her biopsy results and she has had a few appointments with the Breast Center.

## 2022-09-11 ENCOUNTER — Inpatient Hospital Stay: Payer: Medicare HMO

## 2022-09-11 ENCOUNTER — Inpatient Hospital Stay: Payer: Medicare HMO | Attending: Hematology and Oncology | Admitting: Hematology and Oncology

## 2022-09-11 ENCOUNTER — Other Ambulatory Visit: Payer: Self-pay | Admitting: Internal Medicine

## 2022-09-11 ENCOUNTER — Telehealth: Payer: Self-pay | Admitting: Cardiology

## 2022-09-11 ENCOUNTER — Other Ambulatory Visit: Payer: Self-pay

## 2022-09-11 VITALS — BP 180/90 | HR 106 | Temp 97.2°F | Resp 18 | Ht 60.0 in | Wt 165.2 lb

## 2022-09-11 DIAGNOSIS — Z170 Estrogen receptor positive status [ER+]: Secondary | ICD-10-CM

## 2022-09-11 DIAGNOSIS — Z87891 Personal history of nicotine dependence: Secondary | ICD-10-CM | POA: Insufficient documentation

## 2022-09-11 DIAGNOSIS — I700 Atherosclerosis of aorta: Secondary | ICD-10-CM

## 2022-09-11 DIAGNOSIS — C50212 Malignant neoplasm of upper-inner quadrant of left female breast: Secondary | ICD-10-CM | POA: Insufficient documentation

## 2022-09-11 DIAGNOSIS — I77810 Thoracic aortic ectasia: Secondary | ICD-10-CM

## 2022-09-11 NOTE — Assessment & Plan Note (Signed)
08/30/2022:Screening mammogram detected left breast mass 6 mm by ultrasound.  Biopsy UIQ: ADH involving small intraductal papilloma/radial sclerosing lesion.  Mass upper inner quadrant: Grade 2 IDC with mucinous features with DCIS low-grade, ER 100%, PR 100%, Ki-67 2%, HER2 1+ negative  Pathology and radiology counseling: Discussed with the patient, the details of pathology including the type of breast cancer,the clinical staging, the significance of ER, PR and HER-2/neu receptors and the implications for treatment. After reviewing the pathology in detail, we proceeded to discuss the different treatment options between surgery, radiation, and antiestrogen therapies.  Treatment plan: Breast conserving surgery Antiestrogen therapy  I discussed with the patient that most likely radiation will be optional. Return to clinic after surgery to discuss final pathology report

## 2022-09-11 NOTE — Telephone Encounter (Signed)
Pt last seen by Dr Anne Fu 01/13/2019. Follow up was to be based on results of echo.  Echo was ordered and completed at that time.   01/28/2019  7:06 AM EST     Normal pump function. Elevated left atrial filling pressure indicative of high BP. Continue to work on BP with PCP. Ascending aorta was mildly dilated 40 mm, let's repeat an ECHO in one year to ensure stability.  Pt was to repeat in 1 year.  She did have one 12/21 and 12/22 but not since. She would like to know if Dr Anne Fu would order one for this year.   Will forward to MD for review and any orders.

## 2022-09-11 NOTE — Telephone Encounter (Signed)
New Message:       Patient said she have not had an Echo since 2022. She wants to know if Dr Anne Fu wants her to have one tjis year? She missed having one last year.

## 2022-09-11 NOTE — Progress Notes (Signed)
[facility] CONSULT NOTE  Patient Care Team: Sherlene Shams, MD as PCP - General (Internal Medicine) Jake Bathe, MD as PCP - Cardiology (Cardiology)  CHIEF COMPLAINTS/PURPOSE OF CONSULTATION:  Newly diagnosed breast cancer  HISTORY OF PRESENTING ILLNESS:  Lacey Brown 82 y.o. female is here because of recent diagnosis of left breast cancer.  Patient had a routine screening mammogram that detected a left breast mass measuring 6 mm by ultrasound.  She had 2 biopsies performed 1 came back as ADH involving intraductal papilloma and the other biopsy came back as grade 2 invasive ductal carcinoma with DCIS that was low-grade and ER/PR positive HER2 negative with a Ki-67 2%.  Patient was seen by Dr. Donell Beers and was referred to [place] for discussion regarding adjuvant treatment options.  I reviewed her records extensively and collaborated the history with the patient.  SUMMARY OF ONCOLOGIC HISTORY: Oncology History  Malignant neoplasm of upper-inner quadrant of left breast in female, estrogen receptor positive (HCC)  08/30/2022 Initial Diagnosis   Screening mammogram detected left breast mass 6 mm by ultrasound.  Biopsy UIQ: ADH involving small intraductal papilloma/radial sclerosing lesion.  Mass upper inner quadrant: Grade 2 IDC with mucinous features with DCIS low-grade, ER 100%, PR 100%, Ki-67 2%, HER2 1+ negative   09/03/2022 Cancer Staging   Staging form: Breast, AJCC 8th Edition - Clinical stage from 09/03/2022: Stage IA (cT1b, cN0, cM0, G2, ER+, PR+, HER2-) - Signed by Serena Croissant, MD on 09/11/2022 Stage prefix: Initial diagnosis Method of lymph node assessment: Clinical Histologic grading system: 3 grade system      MEDICAL HISTORY:  Past Medical History:  Diagnosis Date   Arthritis    back, neck; right shoulder;    Asthma    using advair prn   Chicken pox    Hyperlipidemia    Hypertension    controlled with medication;     SURGICAL HISTORY: Past  Surgical History:  Procedure Laterality Date   BREAST BIOPSY Left 08/12/2022   [place] LT BREAST BX W LOC DEV 1ST LESION IMG BX SPEC US GUIDE 08/12/2022 [facility]   BREAST BIOPSY Left 08/30/2022   MM LT BREAST BX W LOC DEV EA AD LESION IMG BX SPEC STEREO GUIDE 08/30/2022 [facility]   BREAST BIOPSY Left 08/30/2022   MM LT BREAST BX W LOC DEV 1ST LESION IMAGE BX SPEC STEREO GUIDE 08/30/2022 [facility]   BREAST EXCISIONAL BIOPSY Left    TONSILLECTOMY AND ADENOIDECTOMY      SOCIAL HISTORY: Social History   Socioeconomic History   Marital status: Widowed    Spouse name: Not on file   Number of children: Not on file   Years of education: Not on file   Highest education level: Not on file  Occupational History   Not on file  Tobacco Use   Smoking status: Former    Current packs/day: 0.00    Types: Cigarettes    Quit date: 04/17/2009    Years since quitting: 13.4   Smokeless tobacco: Never  Vaping Use   Vaping status: Never Used  Substance and Sexual Activity   Alcohol use: Yes    Alcohol/week: 0.0 standard drinks of alcohol    Comment: occasional   Drug use: No   Sexual activity: Never  Other Topics Concern   Not on file  Social History Narrative   Not on file   Social Determinants of Health   Financial Resource Strain: Low Risk  (12/10/2021)   Overall  Financial Resource Strain (CARDIA)    Difficulty of Paying Living Expenses: Not hard at all  Food Insecurity: No Food Insecurity (09/02/2022)   Hunger Vital Sign    Worried About Running Out of Food in the Last Year: Never true    Ran Out of Food in the Last Year: Never true  Transportation Needs: No Transportation Needs (09/02/2022)   PRAPARE - Administrator, Civil Service (Medical): No    Lack of Transportation (Non-Medical): No  Physical Activity: Inactive (12/05/2020)   Exercise Vital Sign    Days of Exercise per Week: 0 days    Minutes of Exercise per Session: 0 min  Stress: No Stress Concern  Present (12/10/2021)   [employer] of Occupational Health - Occupational Stress Questionnaire    Feeling of Stress : Not at all  Social Connections: Moderately Integrated (12/10/2021)   Social Connection and Isolation Panel [NHANES]    Frequency of Communication with Friends and Family: More than three times a week    Frequency of Social Gatherings with Friends and Family: More than three times a week    Attends Religious Services: More than 4 times per year    Active Member of [employer] or Organizations: Yes    Attends Banker Meetings: 1 to 4 times per year    Marital Status: Widowed  Intimate Partner Violence: Not At Risk (09/02/2022)   Humiliation, Afraid, Rape, and Kick questionnaire    Fear of Current or Ex-Partner: No    Emotionally Abused: No    Physically Abused: No    Sexually Abused: No    FAMILY HISTORY: Family History  Problem Relation Age of Onset   Heart disease Mother    Heart disease Father    Arthritis Maternal Grandmother    Diabetes Neg Hx    Cancer Neg Hx    Stroke Neg Hx     ALLERGIES:  is allergic to ace inhibitors, carvedilol, codeine, and kenalog [triamcinolone acetonide].  MEDICATIONS:  Current Outpatient Medications  Medication Sig Dispense Refill   acetaminophen (TYLENOL) 650 MG CR tablet Take 1,300 mg by mouth daily.     ADVAIR DISKUS 250-50 MCG/ACT AEPB INHALE 1 PUFF INTO THE LUNGS IN THE MORNING AND AT BEDTIME. (Patient taking differently: Inhale 1 puff into the lungs daily.) 60 each 1   albuterol (VENTOLIN HFA) 108 (90 Base) MCG/ACT inhaler Inhale 2 puffs into the lungs every 6 (six) hours as needed for wheezing or shortness of breath. 3.7 g 11   amLODipine (NORVASC) 2.5 MG tablet TAKE 1 TABLET BY MOUTH EVERY DAY 90 tablet 2   denosumab (PROLIA) 60 MG/ML SOSY injection Inject 60 mg into the skin every 6 (six) months.     metoprolol succinate (TOPROL-XL) 25 MG 24 hr tablet Take 1 tablet (25 mg total) by mouth daily. 90 tablet 1    omeprazole (PRILOSEC OTC) 20 MG tablet Take 1 tablet (20 mg total) by mouth daily. In the morning on an empty stomach (Patient taking differently: Take 20 mg by mouth daily as needed (acid reflux).) 90 tablet 1   rosuvastatin (CRESTOR) 10 MG tablet Take 1 tablet (10 mg total) by mouth daily. 90 tablet 1   telmisartan (MICARDIS) 80 MG tablet TAKE 1 TABLET BY MOUTH EVERY DAY 90 tablet 1   trolamine salicylate (ASPERCREME) 10 % cream Apply 1 Application topically as needed for muscle pain.     Vitamin D, Ergocalciferol, (DRISDOL) 1.25 MG (50000 UNIT) CAPS capsule TAKE 1 CAPSULE  BY MOUTH ONE TIME PER WEEK 12 capsule 0   No current facility-administered medications for this visit.    REVIEW OF SYSTEMS:   Constitutional: Denies fevers, chills or abnormal night sweats Breast:  Denies any palpable lumps or discharge All other systems were reviewed with the patient and are negative.  PHYSICAL EXAMINATION: ECOG PERFORMANCE STATUS: 0 - Asymptomatic  Vitals:   09/11/22 1456  BP: (!) 180/90  Pulse: (!) 106  Resp: 18  Temp: (!) 97.2 F (36.2 C)  SpO2: 94%   Filed Weights   09/11/22 1456  Weight: 165 lb 3.2 oz (74.9 kg)    GENERAL:alert, no distress and comfortable    LABORATORY DATA:  I have reviewed the data as listed Lab Results  Component Value Date   WBC 9.4 06/05/2022   HGB 12.3 06/05/2022   HCT 36.5 06/05/2022   MCV 92.8 06/05/2022   PLT 215.0 06/05/2022   Lab Results  Component Value Date   NA 131 (L) 06/05/2022   K 4.6 06/05/2022   CL 97 06/05/2022   CO2 27 06/05/2022    RADIOGRAPHIC STUDIES: I have personally reviewed the radiological reports and agreed with the findings in the report.  ASSESSMENT AND PLAN:  Malignant neoplasm of upper-inner quadrant of left breast in female, estrogen receptor positive (HCC) 08/30/2022:Screening mammogram detected left breast mass 6 mm by ultrasound.  Biopsy UIQ: ADH involving small intraductal papilloma/radial sclerosing lesion.   Mass upper inner quadrant: Grade 2 IDC with mucinous features with DCIS low-grade, ER 100%, PR 100%, Ki-67 2%, HER2 1+ negative  Pathology and radiology counseling: Discussed with the patient, the details of pathology including the type of breast cancer,the clinical staging, the significance of ER, PR and HER-2/neu receptors and the implications for treatment. After reviewing the pathology in detail, we proceeded to discuss the different treatment options between surgery, radiation, and antiestrogen therapies.  Treatment plan: Breast conserving surgery Antiestrogen therapy  I discussed with the patient that most likely radiation will be optional. Return to clinic after surgery to discuss final pathology report   All questions were answered. The patient knows to call the clinic with any problems, questions or concerns.    Tamsen Meek, MD 09/11/22

## 2022-09-12 ENCOUNTER — Encounter: Payer: Self-pay | Admitting: *Deleted

## 2022-09-12 NOTE — Pre-Procedure Instructions (Signed)
Surgical Instructions   Your procedure is scheduled on September 19, 2022. Report to [facility] Main Entrance "A" at 10:15 A.M., then check in with the Admitting office. Any questions or running late day of surgery: call [phone]  Questions prior to your surgery date: call [phone], Monday-Friday, 8am-4pm. If you experience any cold or flu symptoms such as cough, fever, chills, shortness of breath, etc. between now and your scheduled surgery, please notify us at the above number.     Remember:  Do not eat after midnight the night before your surgery   You may drink clear liquids until 9:15 AM the morning of your surgery.   Clear liquids allowed are: Water, Non-Citrus Juices (without pulp), Carbonated Beverages, Clear Tea, Black Coffee Only (NO MILK, CREAM OR POWDERED CREAMER of any kind), and Gatorade.    Take these medicines the morning of surgery with A SIP OF WATER: acetaminophen (TYLENOL)  ADVAIR DISKUS  amLODipine (NORVASC)  metoprolol succinate (TOPROL-XL)  rosuvastatin (CRESTOR)    May take these medicines IF NEEDED: albuterol (VENTOLIN HFA) inhaler  omeprazole (PRILOSEC OTC)    One week prior to surgery, STOP taking any Aspirin (unless otherwise instructed by your surgeon) Aleve, Naproxen, Ibuprofen, Motrin, Advil, Goody's, BC's, all herbal medications, fish oil, and non-prescription vitamins.                     Do NOT Smoke (Tobacco/Vaping) for 24 hours prior to your procedure.  If you use a CPAP at night, you may bring your mask/headgear for your overnight stay.   You will be asked to remove any contacts, glasses, piercing's, hearing aid's, dentures/partials prior to surgery. Please bring cases for these items if needed.    Patients discharged the day of surgery will not be allowed to drive home, and someone needs to stay with them for 24 hours.  SURGICAL WAITING ROOM VISITATION Patients may have no more than 2 support people in the waiting area - these  visitors may rotate.   Pre-op nurse will coordinate an appropriate time for 1 ADULT support person, who may not rotate, to accompany patient in pre-op.  Children under the age of 65 must have an adult with them who is not the patient and must remain in the main waiting area with an adult.  If the patient needs to stay at the hospital during part of their recovery, the visitor guidelines for inpatient rooms apply.  Please refer to the [facility] website for the visitor guidelines for any additional information.   If you received a COVID test during your pre-op visit  it is requested that you wear a mask when out in public, stay away from anyone that may not be feeling well and notify your surgeon if you develop symptoms. If you have been in contact with anyone that has tested positive in the last 10 days please notify you surgeon.      Pre-operative CHG Bathing Instructions   You can play a key role in reducing the risk of infection after surgery. Your skin needs to be as free of germs as possible. You can reduce the number of germs on your skin by washing with CHG (chlorhexidine gluconate) soap before surgery. CHG is an antiseptic soap that kills germs and continues to kill germs even after washing.   DO NOT use if you have an allergy to chlorhexidine/CHG or antibacterial soaps. If your skin becomes reddened or irritated, stop using the CHG and notify one of our  RNs at [phone].              TAKE A SHOWER THE NIGHT BEFORE SURGERY AND THE DAY OF SURGERY    Please keep in mind the following:  DO NOT shave, including legs and underarms, 48 hours prior to surgery.   You may shave your face before/day of surgery.  Place clean sheets on your bed the night before surgery Use a clean washcloth (not used since being washed) for each shower. DO NOT sleep with pet's night before surgery.  CHG Shower Instructions:  If you choose to wash your hair and private area, wash first with your normal  shampoo/soap.  After you use shampoo/soap, rinse your hair and body thoroughly to remove shampoo/soap residue.  Turn the water OFF and apply half the bottle of CHG soap to a CLEAN washcloth.  Apply CHG soap ONLY FROM YOUR NECK DOWN TO YOUR TOES (washing for 3-5 minutes)  DO NOT use CHG soap on face, private areas, open wounds, or sores.  Pay special attention to the area where your surgery is being performed.  If you are having back surgery, having someone wash your back for you may be helpful. Wait 2 minutes after CHG soap is applied, then you may rinse off the CHG soap.  Pat dry with a clean towel  Put on clean pajamas    Additional instructions for the day of surgery: DO NOT APPLY any lotions, deodorants, cologne, or perfumes.   Do not wear jewelry or makeup Do not wear nail polish, gel polish, artificial nails, or any other type of covering on natural nails (fingers and toes) Do not bring valuables to the hospital. [facility] is not responsible for valuables/personal belongings. Put on clean/comfortable clothes.  Please brush your teeth.  Ask your nurse before applying any prescription medications to the skin.

## 2022-09-12 NOTE — Telephone Encounter (Signed)
Lacey Bathe, MD  You2 hours ago (5:39 AM)    Sure, let's check ECHO this year to monitor aorta    Echo order placed.  Pt to be contacted to be scheduled.

## 2022-09-13 ENCOUNTER — Encounter (HOSPITAL_COMMUNITY)
Admission: RE | Admit: 2022-09-13 | Discharge: 2022-09-13 | Disposition: A | Discharge status: Home / Self Care | Payer: Medicare HMO | Source: Ambulatory Visit | Attending: General Surgery | Admitting: General Surgery

## 2022-09-13 ENCOUNTER — Encounter (HOSPITAL_COMMUNITY): Payer: Self-pay

## 2022-09-13 ENCOUNTER — Other Ambulatory Visit: Payer: Self-pay

## 2022-09-13 VITALS — BP 159/94 | HR 123 | Temp 98.1°F | Resp 18 | Ht 60.0 in | Wt 164.8 lb

## 2022-09-13 DIAGNOSIS — I4891 Unspecified atrial fibrillation: Secondary | ICD-10-CM | POA: Insufficient documentation

## 2022-09-13 DIAGNOSIS — Z01818 Encounter for other preprocedural examination: Secondary | ICD-10-CM | POA: Insufficient documentation

## 2022-09-13 DIAGNOSIS — I10 Essential (primary) hypertension: Secondary | ICD-10-CM | POA: Diagnosis not present

## 2022-09-13 DIAGNOSIS — C50212 Malignant neoplasm of upper-inner quadrant of left female breast: Secondary | ICD-10-CM | POA: Diagnosis not present

## 2022-09-13 DIAGNOSIS — J45909 Unspecified asthma, uncomplicated: Secondary | ICD-10-CM | POA: Diagnosis not present

## 2022-09-13 DIAGNOSIS — I2510 Atherosclerotic heart disease of native coronary artery without angina pectoris: Secondary | ICD-10-CM

## 2022-09-13 DIAGNOSIS — C50912 Malignant neoplasm of unspecified site of left female breast: Secondary | ICD-10-CM | POA: Diagnosis not present

## 2022-09-13 DIAGNOSIS — I499 Cardiac arrhythmia, unspecified: Secondary | ICD-10-CM

## 2022-09-13 HISTORY — DX: Peripheral vascular disease, unspecified: I73.9

## 2022-09-13 HISTORY — DX: Dyspnea, unspecified: R06.00

## 2022-09-13 HISTORY — DX: Cardiac arrhythmia, unspecified: I49.9

## 2022-09-13 LAB — BASIC METABOLIC PANEL
Anion gap: 12 (ref 5–15)
BUN: 15 mg/dL (ref 8–23)
CO2: 24 mmol/L (ref 22–32)
Calcium: 9.3 mg/dL (ref 8.9–10.3)
Chloride: 95 mmol/L — ABNORMAL LOW (ref 98–111)
Creatinine, Ser: 0.64 mg/dL (ref 0.44–1.00)
GFR, Estimated: >60 mL/min (ref >60)
Glucose, Bld: 103 mg/dL — ABNORMAL HIGH (ref 70–99)
Potassium: 4.5 mmol/L (ref 3.5–5.1)
Sodium: 131 mmol/L — ABNORMAL LOW (ref 135–145)

## 2022-09-13 LAB — CBC
HCT: 38.9 % (ref 36.0–46.0)
Hemoglobin: 12.6 g/dL (ref 12.0–15.0)
MCH: 30.7 pg (ref 26.0–34.0)
MCHC: 32.4 g/dL (ref 30.0–36.0)
MCV: 94.6 fL (ref 80.0–100.0)
Platelets: 192 10*3/uL (ref 150–400)
RBC: 4.11 MIL/uL (ref 3.87–5.11)
RDW: 13.1 % (ref 11.5–15.5)
WBC: 8.7 10*3/uL (ref 4.0–10.5)
nRBC: 0 % (ref 0.0–0.2)

## 2022-09-13 NOTE — Progress Notes (Addendum)
PCP - Dr. Duncan Dull Cardiologist - Dr. Donato Schultz - Last office visit 01/13/2019 with PRN follow-up)  PPM/ICD - Denies Device Orders - n/a Rep Notified - n/a  Chest x-ray - Denies EKG - 09/13/2022 Stress Test - Per pt, possibly years ago ECHO - 01/16/2021 Cardiac Cath - Denies  Sleep Study - Denies CPAP - n/a  No DM  Last dose of GLP1 agonist- n/a GLP1 instructions: n/a  Blood Thinner Instructions: n/a Aspirin Instructions: n/a   ERAS Protcol - Clear liquids until 0915 morning of surgery PRE-SURGERY Ensure or G2- n/a  COVID TEST- n/a   Anesthesia review: Yes. Breast seed placement. Per Dr. Anne Fu, pt is to have yearly echo's to monitor for aortic dilation. She missed her echo last year, but per MD pt can proceed with surgery without completing the echo. Pts EKG at PAT appointment read A. Fib. Pt denies any history of A Fib. Discussed with Antionette Poles, PA-C.   Patient denies shortness of breath, fever, cough and chest pain at PAT appointment. Pt denies any respiratory illness/infection in the last two months.   All instructions explained to the patient, with a verbal understanding of the material. Patient agrees to go over the instructions while at home for a better understanding. Patient also instructed to self quarantine after being tested for COVID-19. The opportunity to ask questions was provided.

## 2022-09-13 NOTE — Progress Notes (Signed)
Anesthesia Chart Review:  Patient noted to be in rate controlled new onset A-fib preop testing appointment, rate 90, completely asymptomatic, denies palpitations.  She has some baseline DOE but no acute changes.  Discussed this with her primary cardiologist Dr. Anne Fu.  He advised that given she is asymptomatic and rate controlled she can likely proceed with surgery on the 15th as planned barring acute status change.  Recommended starting Eliquis after surgery when safe from a surgical standpoint.  I have relayed these recommendations to the patient.  I will follow-up after surgery to ensure this all takes this.  Preop labs reviewed, chronic hyponatremia

## 2022-09-16 ENCOUNTER — Encounter (HOSPITAL_COMMUNITY): Payer: Self-pay

## 2022-09-16 NOTE — Telephone Encounter (Signed)
Echo is scheduled for 02/16/2022.

## 2022-09-16 NOTE — Anesthesia Preprocedure Evaluation (Signed)
Anesthesia Evaluation    Airway Mallampati: II  TM Distance: >3 FB Neck ROM: Full    Dental no notable dental hx.    Pulmonary asthma , COPD, former smoker   Pulmonary exam normal breath sounds clear to auscultation       Cardiovascular hypertension, + Peripheral Vascular Disease  Normal cardiovascular exam+ dysrhythmias  Rhythm:Regular Rate:Normal     Neuro/Psych    GI/Hepatic ,GERD  ,,  Endo/Other    Renal/GU      Musculoskeletal  (+) Arthritis ,    Abdominal   Peds  Hematology   Anesthesia Other Findings   Reproductive/Obstetrics                             Anesthesia Physical Anesthesia Plan  ASA: 3  Anesthesia Plan: General   Post-op Pain Management:    Induction: Intravenous  PONV Risk Score and Plan: Ondansetron, Dexamethasone and Droperidol  Airway Management Planned: LMA  Additional Equipment:   Intra-op Plan:   Post-operative Plan: Extubation in OR  Informed Consent: I have reviewed the patients History and Physical, chart, labs and discussed the procedure including the risks, benefits and alternatives for the proposed anesthesia with the patient or authorized representative who has indicated his/her understanding and acceptance.     Dental advisory given  Plan Discussed with: CRNA  Anesthesia Plan Comments: (See :  82 year old female with pertinent history including HTN, asthma, left breast cancer, chronic DOE, mildly dilated ascending aorta (41 mm by echo 01/2021).  Patient noted to be in new onset rate controlled A-fib at preop testing appointment. She reports she has some baseline DOE but no acute changes, no awareness of A-fib, no palpitations, no chest pain.  On exam she is well-appearing, no acute distress, no shortness of breath.  Auscultation of the heart reveals irregularly irregular rhythm.  A-fib confirmed by EKG with ventricular rate of 90 bpm.   Discussed this with her primary cardiologist Dr. Anne Fu.  He advised that given she is asymptomatic and rate controlled, she can likely proceed with surgery on the 15th as planned barring acute status change.  He recommended starting Eliquis after surgery when safe from a surgical standpoint.  I have relayed these recommendations to the patient.  I will also route this note to Dr. Donell Beers.  Of note, patient is due for follow-up of mildly dilated ascending aorta; echo has been ordered and is scheduled to be done 12/2022.  Dr. Anne Fu did confirm that this did not need to be completed prior to surgery.  Preop labs reviewed, chronic mild hyponatremia sodium 131, otherwise unremarkable.  TTE 01/16/2021: 1. Left ventricular ejection fraction, by estimation, is 60 to 65%. Left  ventricular ejection fraction by 3D volume is 64 %. The left ventricle has  normal function. The left ventricle has no regional wall motion  abnormalities. There is mild left  ventricular hypertrophy. Left ventricular diastolic parameters are  consistent with Grade I diastolic dysfunction (impaired relaxation). The  average left ventricular global longitudinal strain is -18.9 %. The global  longitudinal strain is normal.  2. Right ventricular systolic function is normal. The right ventricular  size is normal. There is mildly elevated pulmonary artery systolic  pressure. The estimated right ventricular systolic pressure is 37.3 mmHg.  3. Left atrial size was moderately dilated.  4. The mitral valve is abnormal. Trivial mitral valve regurgitation.  5. The aortic valve is tricuspid. Aortic valve regurgitation is trivial.  Aortic valve sclerosis is present, with no evidence of aortic valve  stenosis.  6. Aortic dilatation noted. There is mild dilatation of the ascending  aorta, measuring 41 mm.  7. The inferior vena cava is normal in size with greater than 50%  respiratory variability, suggesting right atrial pressure of  3 mmHg.   Comparison(s): Changes from prior study are noted. 01/27/20: LVEF 65-70%.  PA pressure . Ascending aorta 42mm.  )        Anesthesia Quick Evaluation

## 2022-09-18 ENCOUNTER — Other Ambulatory Visit: Payer: Self-pay | Admitting: General Surgery

## 2022-09-18 ENCOUNTER — Ambulatory Visit
Admission: RE | Admit: 2022-09-18 | Discharge: 2022-09-18 | Disposition: A | Discharge status: Home / Self Care | Payer: Medicare HMO | Source: Ambulatory Visit | Attending: General Surgery | Admitting: General Surgery

## 2022-09-18 ENCOUNTER — Telehealth: Payer: Self-pay | Admitting: *Deleted

## 2022-09-18 DIAGNOSIS — N6082 Other benign mammary dysplasias of left breast: Secondary | ICD-10-CM | POA: Diagnosis not present

## 2022-09-18 DIAGNOSIS — I4891 Unspecified atrial fibrillation: Secondary | ICD-10-CM

## 2022-09-18 DIAGNOSIS — C50212 Malignant neoplasm of upper-inner quadrant of left female breast: Secondary | ICD-10-CM

## 2022-09-18 DIAGNOSIS — D0512 Intraductal carcinoma in situ of left breast: Secondary | ICD-10-CM | POA: Diagnosis not present

## 2022-09-18 DIAGNOSIS — Z170 Estrogen receptor positive status [ER+]: Secondary | ICD-10-CM

## 2022-09-18 HISTORY — PX: BREAST BIOPSY: SHX20

## 2022-09-18 NOTE — Telephone Encounter (Signed)
Pam, Let's get her set up with appt in AFIB clinic perhaps 1 week post op. Thanks Donato Schultz, MD         Order placed for referral to [facility].

## 2022-09-19 ENCOUNTER — Encounter (HOSPITAL_COMMUNITY): Payer: Self-pay | Admitting: General Surgery

## 2022-09-19 ENCOUNTER — Ambulatory Visit (HOSPITAL_COMMUNITY): Payer: Self-pay | Admitting: Physician Assistant

## 2022-09-19 ENCOUNTER — Ambulatory Visit
Admission: RE | Admit: 2022-09-19 | Discharge: 2022-09-19 | Disposition: A | Discharge status: Home / Self Care | Payer: Medicare HMO | Source: Ambulatory Visit | Attending: General Surgery | Admitting: General Surgery

## 2022-09-19 ENCOUNTER — Other Ambulatory Visit: Payer: Self-pay

## 2022-09-19 ENCOUNTER — Other Ambulatory Visit: Payer: Self-pay | Admitting: General Surgery

## 2022-09-19 ENCOUNTER — Encounter (HOSPITAL_COMMUNITY)
Admission: RE | Disposition: A | Discharge status: Home / Self Care | Payer: Self-pay | Source: Home / Self Care | Attending: General Surgery

## 2022-09-19 ENCOUNTER — Ambulatory Visit (HOSPITAL_BASED_OUTPATIENT_CLINIC_OR_DEPARTMENT_OTHER): Payer: Medicare HMO | Admitting: Certified Registered Nurse Anesthetist

## 2022-09-19 ENCOUNTER — Ambulatory Visit (HOSPITAL_COMMUNITY)
Admission: RE | Admit: 2022-09-19 | Discharge: 2022-09-19 | Disposition: A | Discharge status: Home / Self Care | Payer: Medicare HMO | Attending: General Surgery | Admitting: General Surgery

## 2022-09-19 DIAGNOSIS — J449 Chronic obstructive pulmonary disease, unspecified: Secondary | ICD-10-CM | POA: Diagnosis not present

## 2022-09-19 DIAGNOSIS — Z87891 Personal history of nicotine dependence: Secondary | ICD-10-CM

## 2022-09-19 DIAGNOSIS — C50212 Malignant neoplasm of upper-inner quadrant of left female breast: Secondary | ICD-10-CM

## 2022-09-19 DIAGNOSIS — Z170 Estrogen receptor positive status [ER+]: Secondary | ICD-10-CM | POA: Diagnosis not present

## 2022-09-19 DIAGNOSIS — I10 Essential (primary) hypertension: Secondary | ICD-10-CM

## 2022-09-19 DIAGNOSIS — C50912 Malignant neoplasm of unspecified site of left female breast: Secondary | ICD-10-CM | POA: Diagnosis not present

## 2022-09-19 DIAGNOSIS — N6092 Unspecified benign mammary dysplasia of left breast: Secondary | ICD-10-CM | POA: Diagnosis not present

## 2022-09-19 DIAGNOSIS — D0512 Intraductal carcinoma in situ of left breast: Secondary | ICD-10-CM | POA: Diagnosis not present

## 2022-09-19 HISTORY — PX: BREAST LUMPECTOMY WITH RADIOACTIVE SEED LOCALIZATION: SHX6424

## 2022-09-19 SURGERY — BREAST LUMPECTOMY WITH RADIOACTIVE SEED LOCALIZATION
Anesthesia: General | Laterality: Left

## 2022-09-19 MED ORDER — TRAMADOL HCL 50 MG PO TABS: 50.0000 mg | ORAL_TABLET | Freq: Four times a day (QID) | ORAL | 0 refills | Status: DC | PRN

## 2022-09-19 MED ORDER — FENTANYL CITRATE (PF) 250 MCG/5ML IJ SOLN
INTRAMUSCULAR | Status: DC | PRN
  Administered 2022-09-19 (×3): 25 ug via INTRAVENOUS

## 2022-09-19 MED ORDER — ORAL CARE MOUTH RINSE: 15.0000 mL | Freq: Once | OROMUCOSAL | Status: AC

## 2022-09-19 MED ORDER — ONDANSETRON HCL 4 MG/2ML IJ SOLN
INTRAMUSCULAR | Status: AC
  Filled 2022-09-19: qty 2

## 2022-09-19 MED ORDER — CHLORHEXIDINE GLUCONATE 0.12 % MT SOLN
OROMUCOSAL | Status: AC
  Administered 2022-09-19: 15 mL via OROMUCOSAL
  Filled 2022-09-19: qty 15

## 2022-09-19 MED ORDER — PHENYLEPHRINE HCL-NACL 20-0.9 MG/250ML-% IV SOLN
INTRAVENOUS | Status: DC | PRN
  Administered 2022-09-19: 30 ug/min via INTRAVENOUS

## 2022-09-19 MED ORDER — CEFAZOLIN SODIUM-DEXTROSE 2-4 GM/100ML-% IV SOLN
2.0000 g | INTRAVENOUS | Status: AC
  Administered 2022-09-19: 2 g via INTRAVENOUS

## 2022-09-19 MED ORDER — PROPOFOL 10 MG/ML IV BOLUS
INTRAVENOUS | Status: AC
  Filled 2022-09-19: qty 20

## 2022-09-19 MED ORDER — LIDOCAINE HCL (PF) 1 % IJ SOLN
INTRAMUSCULAR | Status: AC
  Filled 2022-09-19: qty 30

## 2022-09-19 MED ORDER — PROPOFOL 10 MG/ML IV BOLUS
INTRAVENOUS | Status: DC | PRN
  Administered 2022-09-19: 50 mg via INTRAVENOUS
  Administered 2022-09-19: 100 mg via INTRAVENOUS

## 2022-09-19 MED ORDER — CHLORHEXIDINE GLUCONATE 0.12 % MT SOLN: 15.0000 mL | Freq: Once | OROMUCOSAL | Status: AC

## 2022-09-19 MED ORDER — ONDANSETRON HCL 4 MG/2ML IJ SOLN
INTRAMUSCULAR | Status: DC | PRN
Start: 2022-09-19 — End: 2022-09-19
  Administered 2022-09-19: 4 mg via INTRAVENOUS

## 2022-09-19 MED ORDER — BUPIVACAINE-EPINEPHRINE (PF) 0.25% -1:200000 IJ SOLN
INTRAMUSCULAR | Status: AC
  Filled 2022-09-19: qty 30

## 2022-09-19 MED ORDER — CEFAZOLIN SODIUM-DEXTROSE 2-4 GM/100ML-% IV SOLN
INTRAVENOUS | Status: AC
  Filled 2022-09-19: qty 100

## 2022-09-19 MED ORDER — FENTANYL CITRATE (PF) 250 MCG/5ML IJ SOLN
INTRAMUSCULAR | Status: AC
  Filled 2022-09-19: qty 5

## 2022-09-19 MED ORDER — LIDOCAINE 2% (20 MG/ML) 5 ML SYRINGE
INTRAMUSCULAR | Status: DC | PRN
  Administered 2022-09-19: 80 mg via INTRAVENOUS

## 2022-09-19 MED ORDER — ACETAMINOPHEN 500 MG PO TABS
ORAL_TABLET | ORAL | Status: AC
  Filled 2022-09-19: qty 2

## 2022-09-19 MED ORDER — CHLORHEXIDINE GLUCONATE CLOTH 2 % EX PADS: 6.0000 | MEDICATED_PAD | Freq: Once | CUTANEOUS | Status: DC

## 2022-09-19 MED ORDER — ACETAMINOPHEN 500 MG PO TABS: 1000.0000 mg | ORAL_TABLET | ORAL | Status: DC

## 2022-09-19 MED ORDER — LACTATED RINGERS IV SOLN: INTRAVENOUS | Status: DC

## 2022-09-19 MED ORDER — 0.9 % SODIUM CHLORIDE (POUR BTL) OPTIME
TOPICAL | Status: DC | PRN
  Administered 2022-09-19: 1000 mL

## 2022-09-19 MED ORDER — PHENYLEPHRINE 80 MCG/ML (10ML) SYRINGE FOR IV PUSH (FOR BLOOD PRESSURE SUPPORT)
PREFILLED_SYRINGE | INTRAVENOUS | Status: DC | PRN
  Administered 2022-09-19: 80 ug via INTRAVENOUS

## 2022-09-19 MED ORDER — LIDOCAINE HCL 1 % IJ SOLN
INTRAMUSCULAR | Status: DC | PRN
  Administered 2022-09-19: 40 mL via INTRAMUSCULAR

## 2022-09-19 MED ORDER — LIDOCAINE 2% (20 MG/ML) 5 ML SYRINGE
INTRAMUSCULAR | Status: AC
  Filled 2022-09-19: qty 5

## 2022-09-19 SURGICAL SUPPLY — 42 items
ADH SKN CLS APL DERMABOND .7 (GAUZE/BANDAGES/DRESSINGS) ×1
APL PRP STRL LF DISP 70% ISPRP (MISCELLANEOUS) ×1
BAG COUNTER SPONGE SURGICOUNT (BAG) ×1 IMPLANT
BAG SPNG CNTER NS LX DISP (BAG) ×1
BINDER BREAST LRG (GAUZE/BANDAGES/DRESSINGS) IMPLANT
BINDER BREAST XLRG (GAUZE/BANDAGES/DRESSINGS) IMPLANT
CANISTER SUCT 3000ML PPV (MISCELLANEOUS) IMPLANT
CHLORAPREP W/TINT 26 (MISCELLANEOUS) ×1 IMPLANT
CLIP TI LARGE 6 (CLIP) ×1 IMPLANT
COVER PROBE W GEL 5X96 (DRAPES) ×1 IMPLANT
COVER SURGICAL LIGHT HANDLE (MISCELLANEOUS) ×1 IMPLANT
DERMABOND ADVANCED .7 DNX12 (GAUZE/BANDAGES/DRESSINGS) ×1 IMPLANT
DEVICE DUBIN SPECIMEN MAMMOGRA (MISCELLANEOUS) ×1 IMPLANT
DRAPE CHEST BREAST 15X10 FENES (DRAPES) ×1 IMPLANT
ELECT COATED BLADE 2.86 ST (ELECTRODE) ×1 IMPLANT
ELECT REM PT RETURN 9FT ADLT (ELECTROSURGICAL) ×1
ELECTRODE REM PT RTRN 9FT ADLT (ELECTROSURGICAL) ×1 IMPLANT
GAUZE PAD ABD 8X10 STRL (GAUZE/BANDAGES/DRESSINGS) ×1 IMPLANT
GAUZE SPONGE 4X4 12PLY STRL LF (GAUZE/BANDAGES/DRESSINGS) ×1 IMPLANT
GLOVE BIO SURGEON STRL SZ 6 (GLOVE) ×1 IMPLANT
GLOVE INDICATOR 6.5 STRL GRN (GLOVE) ×1 IMPLANT
GOWN STRL REUS W/ TWL LRG LVL3 (GOWN DISPOSABLE) ×1 IMPLANT
GOWN STRL REUS W/ TWL XL LVL3 (GOWN DISPOSABLE) ×1 IMPLANT
GOWN STRL REUS W/TWL LRG LVL3 (GOWN DISPOSABLE) ×1
GOWN STRL REUS W/TWL XL LVL3 (GOWN DISPOSABLE) ×1
KIT BASIN OR (CUSTOM PROCEDURE TRAY) ×1 IMPLANT
KIT MARKER MARGIN INK (KITS) ×1 IMPLANT
LIGHT WAVEGUIDE WIDE FLAT (MISCELLANEOUS) IMPLANT
NDL HYPO 25GX1X1/2 BEV (NEEDLE) ×1 IMPLANT
NEEDLE HYPO 25GX1X1/2 BEV (NEEDLE) ×1
NS IRRIG 1000ML POUR BTL (IV SOLUTION) IMPLANT
PACK GENERAL/GYN (CUSTOM PROCEDURE TRAY) ×1 IMPLANT
STRIP CLOSURE SKIN 1/2X4 (GAUZE/BANDAGES/DRESSINGS) ×1 IMPLANT
SUT MNCRL AB 4-0 PS2 18 (SUTURE) ×1 IMPLANT
SUT SILK 2 0 SH (SUTURE) IMPLANT
SUT VIC AB 2-0 SH 27 (SUTURE) ×2
SUT VIC AB 2-0 SH 27XBRD (SUTURE) IMPLANT
SUT VIC AB 3-0 SH 27 (SUTURE) ×1
SUT VIC AB 3-0 SH 27X BRD (SUTURE) ×1 IMPLANT
SYR CONTROL 10ML LL (SYRINGE) ×1 IMPLANT
TOWEL GREEN STERILE (TOWEL DISPOSABLE) ×1 IMPLANT
TOWEL GREEN STERILE FF (TOWEL DISPOSABLE) ×1 IMPLANT

## 2022-09-19 NOTE — Transfer of Care (Signed)
Immediate Anesthesia Transfer of Care Note  Patient: Lacey Brown  Procedure(s) Performed: LEFT BREAST SEED BRACKETED LUMPECTOMY (Left)  Patient Location: PACU  Anesthesia Type:General  Level of Consciousness: awake, alert , and oriented  Airway & Oxygen Therapy: Patient Spontanous Breathing and Patient connected to face mask oxygen  Post-op Assessment: Report given to RN, Post -op Vital signs reviewed and stable, Patient moving all extremities X 4, and Patient able to stick tongue midline  Post vital signs: Reviewed and stable  Last Vitals:  Vitals Value Taken Time  BP 167/96 09/19/22 1400  Temp 97.8   Pulse 108 09/19/22 1401  Resp 17 09/19/22 1401  SpO2 97 % 09/19/22 1401  Vitals shown include unfiled device data.  Last Pain:  Vitals:   09/19/22 0957  TempSrc:   PainSc: 0-No pain         Complications: No notable events documented.

## 2022-09-19 NOTE — Anesthesia Postprocedure Evaluation (Signed)
Anesthesia Post Note  Patient: Lacey Brown  Procedure(s) Performed: LEFT BREAST SEED BRACKETED LUMPECTOMY (Left)     Patient location during evaluation: PACU Anesthesia Type: General Level of consciousness: awake and alert Pain management: pain level controlled Vital Signs Assessment: post-procedure vital signs reviewed and stable Respiratory status: spontaneous breathing, nonlabored ventilation, respiratory function stable and patient connected to nasal cannula oxygen Cardiovascular status: blood pressure returned to baseline and stable Postop Assessment: no apparent nausea or vomiting Anesthetic complications: no   No notable events documented.  Last Vitals:  Vitals:   09/19/22 1415 09/19/22 1430  BP: (!) 143/81 (!) 161/71  Pulse: 98 100  Resp: 15 15  Temp:  36.6 C  SpO2: 93% 93%    Last Pain:  Vitals:   09/19/22 1400  TempSrc:   PainSc: 0-No pain                 Baylis Nation

## 2022-09-19 NOTE — Op Note (Signed)
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 The risks, benefits, complications, treatment options, and expected outcomes were discussed with the patient. The possibilities of bleeding, infection, the need for additional procedures, failure to diagnose a condition, and creating a complication requiring other procedures or operations were discussed with the patient. The patient concurred with the proposed plan, giving informed consent.  The site of surgery properly noted/marked. The patient was taken to Operating Room # 2, identified, and the procedure verified as left breast seed bracketed lumpectomy.  The left breast and chest were prepped and draped in standard fashion. An elliptical incision was made near the previously placed radioactive seeds incorporating one of the biopsy sites as the skin was tethered down to the location of the cancer.  Dissection was carried down around the points of maximum signal intensity. The cautery was used to perform the dissection.   The specimen was inked with the margin marker paint kit.    Specimen radiography confirmed inclusion of three clips and 2 seeds.  The background signal in the breast was zero.  Hemostasis was achieved with cautery.  3D specimen mammogram was performed and one of the seeds was very close to the medial aspect of the specimen.  Additional medial margin was taken.  The tissue laterally was fairly dense and firm.  A lateral margin was also taken.  The cavity was marked with clips on each  border other than the anterior border.    Deeper tissues were reapproximated using interrupted 2-0 vicryl to minimize the cavity size.  The wound was irrigated and closed with 3-0 vicryl interrupted deep dermal sutures and 4-0 monocryl running subcuticular suture.      Sterile dressings were applied. At the end of the operation, all sponge, instrument, and needle counts were correct.   Findings: Seed, clip in specimen.  anterior margin is skin, posterior margin is pectoralis.     Estimated Blood Loss:  min         Specimens: left breast tissue with seeds, additional medial margin, additional lateral margin         Complications:  None; patient tolerated the procedure well.         Disposition: PACU - hemodynamically stable.         Condition: stable

## 2022-09-19 NOTE — H&P (Signed)
REFERRING PHYSICIAN: Frederico Hamman, MD  PROVIDER: Matthias Hughs, MD  Care Team: Patient Care Team: Wonda Cerise, MD as PCP - General (Internal Medicine) Matthias Hughs, MD as Consulting Provider (Surgical Oncology)   MRN: [id number] DOB: 12/12/1940 DATE OF ENCOUNTER: 08/23/2022  Subjective   Chief Complaint: NEW BREAST CANCER    History of Present Illness: Lacey Brown is a 82 y.o. female who is seen today as an office consultation at the request of Dr. Darrick Huntsman for evaluation of NEW BREAST CANCER  .   Pt presents with new diagnosis of left breast cancer 08/2022. She had screening detected possible left breast mass. Diagnostic imaging was performed. This showed a 6 mm mass at 10 o'clock on the left. There were questionable satellite masses x 2 measuring 4 mm. The main mass was biopsied and path showed a grade 2 invasive mammary carcinoma (e cadherin not done due to insufficient tumor). Prognostic panel was strongly ER and PR +, her 2 negative, Ki 67 5%.   Pt hasn't had prior cancer. She denies family cancer history.   She is retired, but enjoys gardening on her patio. She is accompanied by a fried of 60 years.   Diagnostic mammogram/us BCG :08/07/22  ACR Breast Density Category c: The breasts are heterogeneously  dense, which may obscure small masses.   FINDINGS:  On spot compression imaging, the possible mass noted in the upper  inner left breast on the current screening exam persists as a small,  5 mm, oval circumscribed mass.   Targeted left breast ultrasound is performed, showing a small oval  hypoechoic mass, which may be intraductal, at 10 o'clock, 3 cm from  the nipple, measuring 6 x 4 x 5 mm, consistent in size, shape and  location to the mammographic mass. Left axilla ultrasound  demonstrates normal lymph nodes. No enlarged or abnormal lymph  nodes.   IMPRESSION:  1. Indeterminate, 6 mm mass in the left breast at 10 o'clock. Tissue   sampling is recommended.   RECOMMENDATION:  1. Ultrasound-guided core needle biopsy of the 6 mm mass in the left  breast at 10 o'clock. This procedure was scheduled prior to the  patient being [facility].   I have discussed the findings and recommendations with the patient.  If applicable, a reminder letter will be sent to the patient  regarding the next appointment.   BI-RADS CATEGORY 4: Suspicious.  Pathology core needle biopsy: 08/12/22 Described above. GPA [phone]  Review of Systems: A complete review of systems was obtained from the patient. I have reviewed this information and discussed as appropriate with the patient. See HPI as well for other ROS. ROS + for occasional SOB with exertion  Medical History: Past Medical History:  Diagnosis Date  Anxiety  Arthritis  Asthma, unspecified asthma severity, unspecified whether complicated, unspecified whether persistent (HHS-HCC)  Depression  GERD (gastroesophageal reflux disease)  Hypertension  Obesity  Obstructive lung disease (CMS/HHS-HCC)  Osteopenia   Patient Active Problem List  Diagnosis  Hypertension  COPD (chronic obstructive pulmonary disease) (CMS/HHS-HCC)  GERD (gastroesophageal reflux disease)  Anxiety and depression  Medicare annual wellness visit, subsequent  Visit for screening mammogram  Encounter for medication monitoring  Hyponatremia  Malignant neoplasm of upper-inner quadrant of left breast in female, estrogen receptor positive (CMS/HHS-HCC)   Past Surgical History:  Procedure Laterality Date  BREAST BIOPSY 25-30 years ago  benign  FOOT SURGERY In her 53s  ganglion cyst  Allergies  Allergen Reactions  Ace Inhibitors Cough  Codeine Nausea   Current Outpatient Medications on File Prior to Visit  Medication Sig Dispense Refill  amLODIPine (NORVASC) 2.5 MG tablet Take 1 tablet by mouth once daily  fluticasone propion-salmeteroL (ADVAIR DISKUS) 100-50 mcg/dose  diskus inhaler Inhale 1 Puff into the lungs every 12 (twelve) hours  ibuprofen (ADVIL,MOTRIN) 400 MG tablet Take 400 mg by mouth every 6 (six) hours as needed for Pain.  metoprolol succinate (TOPROL-XL) 25 MG XL tablet Take 25 mg by mouth once daily  rosuvastatin (CRESTOR) 10 MG tablet Take 1 tablet by mouth once daily  telmisartan (MICARDIS) 80 MG tablet Take 1 tablet by mouth once daily  albuterol 90 mcg/actuation inhaler TAKE 2 PUFFS BY MOUTH EVERY 6 HOURS AS NEEDED FOR WHEEZE OR SHORTNESS OF BREATH (Patient not taking: Reported on 08/23/2022)  ascorbic acid (VITAMIN C) 500 MG tablet Take 500 mg by mouth once daily. (Patient not taking: Reported on 08/23/2022)  calcium carbonate-vitamin D3 600 mg(1,500mg ) -400 unit Cap 1 tab by mouth 2 times a day (Patient not taking: Reported on 08/23/2022)  cholecalciferol (VITAMIN D3) 1000 unit tablet Take 1,000 Units by mouth daily. (Patient not taking: Reported on 08/23/2022)  losartan (COZAAR) 100 MG tablet Take 100 mg by mouth once daily (Patient not taking: Reported on 08/23/2022)  multivitamin capsule 1 cap by mouth daily (Patient not taking: Reported on 08/23/2022)  omega-3 fatty acids-fish oil 300-1,000 mg Cap 1 cap by mouth daily (Patient not taking: Reported on 08/23/2022)   No current facility-administered medications on file prior to visit.   Family History  Problem Relation Age of Onset  Coronary Artery Disease (Blocked arteries around heart) Other  Coronary Artery Disease (Blocked arteries around heart) Mother  Coronary Artery Disease (Blocked arteries around heart) Father    Social History   Tobacco Use  Smoking Status Former  Smokeless Tobacco Never  Tobacco Comments  Quit around 2002    Social History   Socioeconomic History  Marital status: Unknown  Tobacco Use  Smoking status: Former  Smokeless tobacco: Never  Tobacco comments:  Quit around 2002  Substance and Sexual Activity  Alcohol use: Yes  Comment: few times/week   Drug use: Never   Social Determinants of Health   Financial Resource Strain: Low Risk (12/10/2021)  Received from [facility], [place]  Overall Financial Resource Strain (CARDIA)  Difficulty of Paying Living Expenses: Not hard at all  Food Insecurity: No Food Insecurity (12/10/2021)  Received from [facility], [place]  Hunger Vital Sign  Worried About Running Out of Food in the Last Year: Never true  Ran Out of Food in the Last Year: Never true  Transportation Needs: No Transportation Needs (12/10/2021)  Received from [facility], [place]  PRAPARE - Transportation  Lack of Transportation (Medical): No  Lack of Transportation (Non-Medical): No  Physical Activity: Inactive (12/05/2020)  Received from [facility], [place]  Exercise Vital Sign  Days of Exercise per Week: 0 days  Minutes of Exercise per Session: 0 min  Stress: No Stress Concern Present (12/10/2021)  Received from [facility], [facility]  [facility] - Occupational Stress Questionnaire  Feeling of Stress : Not at all  Social Connections: Moderately Integrated (12/10/2021)  Received from [facility], [place]  Social Connection and Isolation Panel [NHANES]  Frequency of Communication with Friends and Family: More than three times a week  Frequency of Social Gatherings with Friends and Family: More than  three times a week  Attends Religious Services: More than 4 times per year  Active Member of Clubs or Organizations: Yes  Attends Banker Meetings: 1 to 4 times per year  Marital Status: Widowed   Objective:   Vitals:  08/23/22 1128 08/23/22 1129  BP: (!) 142/84  Pulse: (!) 120  Temp: 36.8 C (98.3 F)  SpO2: 93%  Weight: 74.4 kg (164 lb)  Height: 152.4 cm (5')  PainSc: 0-No pain  PainLoc: Breast   Body mass index is 32.03 kg/m.  Gen: No acute distress. Well nourished and well groomed.  Neurological: Alert and oriented to person, place, and time.  Coordination normal.  Head: Normocephalic and atraumatic.  Eyes: Conjunctivae are normal. Pupils are equal, round, and reactive to light. No scleral icterus.  Neck: Normal range of motion. Neck supple. No tracheal deviation or thyromegaly present.  Cardiovascular: Normal rate, regular rhythm, normal heart sounds and intact distal pulses. Exam reveals no gallop and no friction rub. No murmur heard. Breast: left breast sl larger. Left breast with bruising at biopsy site. No palpable masses. No nipple retraction or nipple discharge. No skin dimpling. No LAD. Right breast benign. Prior surgical scar left breast laterally Respiratory: Effort normal. No respiratory distress. No chest wall tenderness. Breath sounds normal. No wheezes, rales or rhonchi.  GI: Soft. Bowel sounds are normal. The abdomen is soft and nontender. There is no rebound and no guarding.  Musculoskeletal: Normal range of motion. Extremities are nontender.  Lymphadenopathy: No cervical, preauricular, postauricular or axillary adenopathy is present Skin: Skin is warm and dry. No rash noted. No diaphoresis. No erythema. No pallor. No clubbing, cyanosis, or edema.  Psychiatric: Normal mood and affect. Behavior is normal. Judgment and thought content normal.   Labs None avail  Assessment and Plan:   ICD-10-CM  1. Malignant neoplasm of upper-inner quadrant of left breast in female, estrogen receptor positive (CMS/HHS-HCC) C50.212 Ambulatory Referral to Oncology-Medical  Z17.0 Ambulatory Referral to Radiation Oncology    Pt has new diagnosis of cT1bN0 left breast cancer. She has additional biopsies set up next week for the satellite nodules. Either way, we can perform breast conserving surgery. I will need to do bracketed lumpectomy if these are positive, or single seed if negative. I do not think she needs a SLN bx given age and prognostic panel.   I will refer to medical and radiation oncology. She is likely to be recommended one or  the other, but they may recommend both depending on final pathology.  I reviewed seed localized lumpectomy with patient and friend.  I discussed the incision type and location and that we will need radiology involved with 1-2 seed markers.  We discussed the risks bleeding, infection, damage to other structures, need for further procedures/surgeries. We discussed the risk of seroma. The patient was advised if the breast has cancer, we may need to go back to surgery for additional tissue to obtain negative margins or for a lymph node biopsy. The patient was advised that these are the most common complications, but that others can occur as well. I discussed the risk of alteration in breast contour or size. I discussed risk of chronic pain. There are rare instances of heart/lung issues post op as well as blood clots.   They were advised against taking aspirin or other anti-inflammatory agents/blood thinners the week before surgery.   The risks and benefits of the procedure were described to the patient and she wishes to proceed.

## 2022-09-19 NOTE — Interval H&P Note (Signed)
History and Physical Interval Note:  09/19/2022 12:17 PM  Lacey Brown  has presented today for surgery, with the diagnosis of LEFT BREAST CANCER.  The various methods of treatment have been discussed with the patient and family. After consideration of risks, benefits and other options for treatment, the patient has consented to  Procedure(s): LEFT BREAST SEED BRACKETED LUMPECTOMY (Left) as a surgical intervention.  [name] history has been reviewed, patient examined, no change in status, stable for surgery.  I have reviewed the patient's chart and labs.  Questions were answered to the patient's satisfaction.     Almond Lint

## 2022-09-19 NOTE — Discharge Instructions (Addendum)

## 2022-09-19 NOTE — Anesthesia Procedure Notes (Addendum)
Procedure Name: LMA Insertion Date/Time: 09/19/2022 12:45 PM  Performed by: Cy Blamer, CRNAPre-anesthesia Checklist: Patient identified, Emergency Drugs available, Suction available, Patient being monitored and Timeout performed Patient Re-evaluated:Patient Re-evaluated prior to induction Oxygen Delivery Method: Circle system utilized Preoxygenation: Pre-oxygenation with 100% oxygen Induction Type: IV induction LMA: LMA inserted LMA Size: 4.0 Number of attempts: 1 Placement Confirmation: positive ETCO2 Tube secured with: Tape Dental Injury: Teeth and Oropharynx as per pre-operative assessment  Comments: Performed by Ludwig Clarks

## 2022-09-20 ENCOUNTER — Encounter (HOSPITAL_COMMUNITY): Payer: Self-pay | Admitting: General Surgery

## 2022-09-20 NOTE — Telephone Encounter (Signed)
Lacey Brown, CMA  You8 minutes ago (4:36 PM)    Hi, patient is scheduled to come in on 8/22 at 2:30 pm for appointment to see Tawny Hopping. She may call back and adjust her appointment to a later time. Communicated with patient regarding her appointment and the location of our clinic. Patient verbalized understanding.

## 2022-09-26 ENCOUNTER — Other Ambulatory Visit (HOSPITAL_COMMUNITY): Payer: Self-pay | Admitting: Internal Medicine

## 2022-09-26 ENCOUNTER — Ambulatory Visit (HOSPITAL_COMMUNITY)
Admit: 2022-09-26 | Discharge: 2022-09-26 | Disposition: A | Discharge status: Home / Self Care | Payer: Medicare HMO | Attending: Internal Medicine | Admitting: Internal Medicine

## 2022-09-26 ENCOUNTER — Encounter (HOSPITAL_COMMUNITY): Payer: Self-pay | Admitting: Internal Medicine

## 2022-09-26 ENCOUNTER — Encounter: Payer: Self-pay | Admitting: *Deleted

## 2022-09-26 VITALS — BP 150/80 | HR 97 | Ht 60.0 in | Wt 163.2 lb

## 2022-09-26 DIAGNOSIS — D6869 Other thrombophilia: Secondary | ICD-10-CM

## 2022-09-26 DIAGNOSIS — I10 Essential (primary) hypertension: Secondary | ICD-10-CM | POA: Insufficient documentation

## 2022-09-26 DIAGNOSIS — R0609 Other forms of dyspnea: Secondary | ICD-10-CM | POA: Diagnosis not present

## 2022-09-26 DIAGNOSIS — E785 Hyperlipidemia, unspecified: Secondary | ICD-10-CM | POA: Insufficient documentation

## 2022-09-26 DIAGNOSIS — I4891 Unspecified atrial fibrillation: Secondary | ICD-10-CM | POA: Diagnosis not present

## 2022-09-26 DIAGNOSIS — I4819 Other persistent atrial fibrillation: Secondary | ICD-10-CM | POA: Diagnosis not present

## 2022-09-26 DIAGNOSIS — Z853 Personal history of malignant neoplasm of breast: Secondary | ICD-10-CM | POA: Diagnosis not present

## 2022-09-26 DIAGNOSIS — J45909 Unspecified asthma, uncomplicated: Secondary | ICD-10-CM | POA: Diagnosis not present

## 2022-09-26 DIAGNOSIS — E669 Obesity, unspecified: Secondary | ICD-10-CM | POA: Insufficient documentation

## 2022-09-26 DIAGNOSIS — I77819 Aortic ectasia, unspecified site: Secondary | ICD-10-CM | POA: Insufficient documentation

## 2022-09-26 LAB — CBC
HCT: 38.7 % (ref 36.0–46.0)
Hemoglobin: 12.6 g/dL (ref 12.0–15.0)
MCH: 30.2 pg (ref 26.0–34.0)
MCHC: 32.6 g/dL (ref 30.0–36.0)
MCV: 92.8 fL (ref 80.0–100.0)
Platelets: 234 10*3/uL (ref 150–400)
RBC: 4.17 MIL/uL (ref 3.87–5.11)
RDW: 12.7 % (ref 11.5–15.5)
WBC: 8.5 10*3/uL (ref 4.0–10.5)
nRBC: 0 % (ref 0.0–0.2)

## 2022-09-26 LAB — SURGICAL PATHOLOGY

## 2022-09-26 LAB — BASIC METABOLIC PANEL
Anion gap: 7 (ref 5–15)
BUN: 18 mg/dL (ref 8–23)
CO2: 26 mmol/L (ref 22–32)
Calcium: 9.1 mg/dL (ref 8.9–10.3)
Chloride: 96 mmol/L — ABNORMAL LOW (ref 98–111)
Creatinine, Ser: 0.56 mg/dL (ref 0.44–1.00)
GFR, Estimated: >60 mL/min (ref >60)
Glucose, Bld: 107 mg/dL — ABNORMAL HIGH (ref 70–99)
Potassium: 4.8 mmol/L (ref 3.5–5.1)
Sodium: 129 mmol/L — ABNORMAL LOW (ref 135–145)

## 2022-09-26 MED ORDER — APIXABAN 5 MG PO TABS: 5.0000 mg | ORAL_TABLET | Freq: Two times a day (BID) | ORAL | 6 refills | Status: DC

## 2022-09-26 NOTE — Progress Notes (Signed)
Primary Care Physician: Sherlene Shams, MD Primary Cardiologist: Donato Schultz, MD Electrophysiologist: None     Referring Physician: Dr. [name] is a 82 y.o. female with a history of HTN, HLD, obesity, asthma, left breast cancer, chronic DOE, mildly dilated ascending aorta, and paroxysmal atrial fibrillation who presents for consultation in the [facility]. Noted to be in new onset rate controlled Afib at preop testing appointment. Recommended by Dr. Anne Fu to begin Eliquis after surgery on 8/15. Patient has a CHADS2VASC score of 4.  On evaluation today, she is currently in rate controlled Afib. She does not appear have cardiac awareness of Afib; may feel more tired. She is not on anticoagulant currently. She drinks alcohol quite possibly on a daily basis. She may snore.   Today, she denies symptoms of palpitations, chest pain, shortness of breath, orthopnea, PND, lower extremity edema, dizziness, presyncope, syncope, snoring, daytime somnolence, bleeding, or neurologic sequela. The patient is tolerating medications without difficulties and is otherwise without complaint today.   she has a BMI of Body mass index is 31.87 kg/m.Marland Kitchen Filed Weights   09/26/22 1347  Weight: 74 kg    Current Outpatient Medications  Medication Sig Dispense Refill   acetaminophen (TYLENOL) 650 MG CR tablet Take 1,300 mg by mouth daily.     albuterol (VENTOLIN HFA) 108 (90 Base) MCG/ACT inhaler Inhale 2 puffs into the lungs every 6 (six) hours as needed for wheezing or shortness of breath. 3.7 g 11   amLODipine (NORVASC) 2.5 MG tablet TAKE 1 TABLET BY MOUTH EVERY DAY 90 tablet 2   apixaban (ELIQUIS) 5 MG TABS tablet Take 1 tablet (5 mg total) by mouth 2 (two) times daily. 180 tablet 6   denosumab (PROLIA) 60 MG/ML SOSY injection Inject 60 mg into the skin every 6 (six) months.     metoprolol succinate (TOPROL-XL) 25 MG 24 hr tablet Take 1 tablet (25 mg total)  by mouth daily. 90 tablet 1   omeprazole (PRILOSEC OTC) 20 MG tablet Take 1 tablet (20 mg total) by mouth daily. In the morning on an empty stomach (Patient taking differently: Take 20 mg by mouth daily as needed (acid reflux).) 90 tablet 1   rosuvastatin (CRESTOR) 10 MG tablet Take 1 tablet (10 mg total) by mouth daily. 90 tablet 1   telmisartan (MICARDIS) 80 MG tablet TAKE 1 TABLET BY MOUTH EVERY DAY 90 tablet 1   traMADol (ULTRAM) 50 MG tablet Take 1 tablet (50 mg total) by mouth every 6 (six) hours as needed for moderate pain or severe pain. 20 tablet 0   trolamine salicylate (ASPERCREME) 10 % cream Apply 1 Application topically as needed for muscle pain.     Vitamin D, Ergocalciferol, (DRISDOL) 1.25 MG (50000 UNIT) CAPS capsule TAKE 1 CAPSULE BY MOUTH ONE TIME PER WEEK 12 capsule 0   WIXELA INHUB 250-50 MCG/ACT AEPB INHALE 1 PUFF INTO THE LUNGS IN THE MORNING AND AT BEDTIME. 60 each 1   No current facility-administered medications for this encounter.    Atrial Fibrillation Management history:  Previous antiarrhythmic drugs: None Previous cardioversions: None Previous ablations: None Anticoagulation history: None   ROS- All systems are reviewed and negative except as per the HPI above.  Physical Exam: BP (!) 150/80   Pulse 97   Ht 5' (1.524 m)   Wt 74 kg   BMI 31.87 kg/m   GEN: Well nourished, well developed in no acute distress NECK:  No JVD; No carotid bruits CARDIAC: Irregularly irregular rate and rhythm, no murmurs, rubs, gallops RESPIRATORY:  Clear to auscultation without rales, wheezing or rhonchi  ABDOMEN: Soft, non-tender, non-distended EXTREMITIES:  No edema; No deformity   EKG today demonstrates  Vent. rate 97 BPM PR interval * ms QRS duration 82 ms QT/QTcB 284/360 ms P-R-T axes * 75 2 Atrial fibrillation Low voltage QRS Abnormal ECG When compared with ECG of 13-Sep-2022 13:58, PREVIOUS ECG IS PRESENT  Echo 01/16/21 demonstrated   1. Left ventricular  ejection fraction, by estimation, is 60 to 65%. Left  ventricular ejection fraction by 3D volume is 64 %. The left ventricle has  normal function. The left ventricle has no regional wall motion  abnormalities. There is mild left  ventricular hypertrophy. Left ventricular diastolic parameters are  consistent with Grade I diastolic dysfunction (impaired relaxation). The  average left ventricular global longitudinal strain is -18.9 %. The global  longitudinal strain is normal.   2. Right ventricular systolic function is normal. The right ventricular  size is normal. There is mildly elevated pulmonary artery systolic  pressure. The estimated right ventricular systolic pressure is 37.3 mmHg.   3. Left atrial size was moderately dilated.   4. The mitral valve is abnormal. Trivial mitral valve regurgitation.   5. The aortic valve is tricuspid. Aortic valve regurgitation is trivial.  Aortic valve sclerosis is present, with no evidence of aortic valve  stenosis.   6. Aortic dilatation noted. There is mild dilatation of the ascending  aorta, measuring 41 mm.   7. The inferior vena cava is normal in size with greater than 50%  respiratory variability, suggesting right atrial pressure of 3 mmHg.   ASSESSMENT & PLAN CHA2DS2-VASc Score = 4  [name] score is based upon: CHF History: 0 HTN History: 1 Diabetes History: 0 Stroke History: 0 Vascular Disease History: 0 Age Score: 2 Gender Score: 1       ASSESSMENT AND PLAN: Persistent Atrial Fibrillation (ICD10:  I48.19) [name] CHA2DS2-VASc score is 4, indicating a 4.8% annual risk of stroke.    She is currently in Afib.  Education provided about Afib. Patient seemed hesitant to begin anticoagulation or proceed with any kind of intervention. I went over in great detail the indication for anticoagulation, reasonable next step in terms of intervention would be a cardioversion, what this procedure entails, the possibility she may return  in normal rhythm or have ERAF, and different types of treatment. Discussion about medication treatments and ablation going forward if indicated. After discussion, we will proceed with scheduling cardioversion after she voiced understanding and agreement. Rhythm monitoring device recommended (she can wear friend's Apple watch to spot check rhythm at home). Labs today for DCCV.  Informed Consent   Shared Decision Making/Informed Consent The risks (stroke, cardiac arrhythmias rarely resulting in the need for a temporary or permanent pacemaker, skin irritation or burns and complications associated with conscious sedation including aspiration, arrhythmia, respiratory failure and death), benefits (restoration of normal sinus rhythm) and alternatives of a direct current cardioversion were explained in detail to Lacey Brown and she agrees to proceed.      Secondary Hypercoagulable State (ICD10:  D68.69) The patient is at significant risk for stroke/thromboembolism based upon her CHA2DS2-VASc Score of 4.  Start Apixaban (Eliquis).  After above discussion on the prevention of stroke secondary to Afib, she has voiced agreement and understanding and agrees to begin anticoagulation. She will begin Eliquis 5 mg BID and we will schedule DCCV  at least 3 weeks from tomorrow.    Follow up 2 weeks after DCCV.    Lacey Bells, PA-C  Afib Clinic [facility] [address] [place], [place] [address] [phone]

## 2022-09-26 NOTE — Patient Instructions (Addendum)
  Start Eliquis 5 mg- Take 1 tablet by mouth twice daily    Cardioversion scheduled for: September 17th 2024   - Arrive at the [facility] and go to admitting at 11:30 am  - Do not eat or drink anything after midnight the night prior to your procedure.   - Take all your morning medication (except diabetic medications) with a sip of water prior to arrival.  - You will not be able to drive home after your procedure.    - Do NOT miss any doses of your blood thinner - if you should miss a dose please notify our office immediately.   - If you feel as if you go back into normal rhythm prior to scheduled cardioversion, please notify our office immediately.   If your procedure is canceled in the cardioversion suite you will be charged a cancellation fee.

## 2022-09-27 NOTE — Pre-Procedure Instructions (Signed)
Notified patient that we had scheduling conflict for 9/17 for cardioversion.  Patient was rescheduled for Wednesday 9/18.  Patient verbalized understanding.

## 2022-09-30 ENCOUNTER — Encounter: Payer: Self-pay | Admitting: *Deleted

## 2022-10-01 ENCOUNTER — Encounter: Payer: Self-pay | Admitting: *Deleted

## 2022-10-01 ENCOUNTER — Telehealth: Payer: Self-pay | Admitting: *Deleted

## 2022-10-01 ENCOUNTER — Telehealth: Payer: Self-pay | Admitting: Radiation Oncology

## 2022-10-01 DIAGNOSIS — C50212 Malignant neoplasm of upper-inner quadrant of left female breast: Secondary | ICD-10-CM

## 2022-10-01 NOTE — Telephone Encounter (Signed)
Referral placed for pt to see Dr. Basilio Cairo to discuss xrt. Pt decline to schedule at this time and will discuss with Dr. Pamelia Hoit on 9/4. She is considering only AI.

## 2022-10-01 NOTE — Telephone Encounter (Signed)
8/27 @ 9:57 am Called/spoke to patient to get her schedule for follow up consult with Dr. Basilio Cairo.  Patient stated Dr. Pamelia Hoit will be giving her a pill and her results was benign.  She declined any appointments at this time till she speak to Dr. Pamelia Hoit.  Secure chat sent to [facility], so they are aware.

## 2022-10-08 ENCOUNTER — Other Ambulatory Visit: Payer: Self-pay | Admitting: Hematology and Oncology

## 2022-10-08 DIAGNOSIS — Z170 Estrogen receptor positive status [ER+]: Secondary | ICD-10-CM

## 2022-10-08 NOTE — Progress Notes (Signed)
Patient Care Team: Sherlene Shams, MD as PCP - General (Internal Medicine) Jake Bathe, MD as PCP - Cardiology (Cardiology) Pershing Proud, RN as Oncology Nurse Navigator Donnelly Angelica, RN as Oncology Nurse Navigator Serena Croissant, MD as Consulting Physician (Hematology and Oncology)  DIAGNOSIS:  Encounter Diagnosis  Name Primary?   Malignant neoplasm of upper-inner quadrant of left breast in female, estrogen receptor positive (HCC) Yes    SUMMARY OF ONCOLOGIC HISTORY: Oncology History  Malignant neoplasm of upper-inner quadrant of left breast in female, estrogen receptor positive (HCC)  08/30/2022 Initial Diagnosis   Screening mammogram detected left breast mass 6 mm by ultrasound.  Biopsy UIQ: ADH involving small intraductal papilloma/radial sclerosing lesion.  Mass upper inner quadrant: Grade 2 IDC with mucinous features with DCIS low-grade, ER 100%, PR 100%, Ki-67 2%, HER2 1+ negative   09/03/2022 Cancer Staging   Staging form: Breast, AJCC 8th Edition - Clinical stage from 09/03/2022: Stage IA (cT1b, cN0, cM0, G2, ER+, PR+, HER2-) - Signed by Serena Croissant, MD on 09/11/2022 Stage prefix: Initial diagnosis Method of lymph node assessment: Clinical Histologic grading system: 3 grade system     CHIEF COMPLIANT: Follow-up after surgery  INTERVAL HISTORY: Lacey Brown is a 82 y.o. female is here because of recent diagnosis of left breast cancer. She reports to the clinic for a follow-up after surgery. Patient states surgery went well. She denies any pain or discomfort. She says she has some some tenderness if you touch it. She did not have to take any pain medication.  ALLERGIES:  is allergic to ace inhibitors, carvedilol, codeine, and kenalog [triamcinolone acetonide].  MEDICATIONS:  Current Outpatient Medications  Medication Sig Dispense Refill   acetaminophen (TYLENOL) 650 MG CR tablet Take 1,300 mg by mouth daily.     albuterol (VENTOLIN HFA) 108 (90 Base)  MCG/ACT inhaler Inhale 2 puffs into the lungs every 6 (six) hours as needed for wheezing or shortness of breath. 3.7 g 11   amLODipine (NORVASC) 2.5 MG tablet TAKE 1 TABLET BY MOUTH EVERY DAY 90 tablet 2   apixaban (ELIQUIS) 5 MG TABS tablet Take 1 tablet (5 mg total) by mouth 2 (two) times daily. 180 tablet 6   denosumab (PROLIA) 60 MG/ML SOSY injection Inject 60 mg into the skin every 6 (six) months.     metoprolol succinate (TOPROL-XL) 25 MG 24 hr tablet Take 1 tablet (25 mg total) by mouth daily. 90 tablet 1   omeprazole (PRILOSEC OTC) 20 MG tablet Take 1 tablet (20 mg total) by mouth daily. In the morning on an empty stomach (Patient taking differently: Take 20 mg by mouth daily as needed (acid reflux).) 90 tablet 1   rosuvastatin (CRESTOR) 10 MG tablet Take 1 tablet (10 mg total) by mouth daily. 90 tablet 1   telmisartan (MICARDIS) 80 MG tablet TAKE 1 TABLET BY MOUTH EVERY DAY 90 tablet 1   traMADol (ULTRAM) 50 MG tablet Take 1 tablet (50 mg total) by mouth every 6 (six) hours as needed for moderate pain or severe pain. 20 tablet 0   trolamine salicylate (ASPERCREME) 10 % cream Apply 1 Application topically as needed for muscle pain.     Vitamin D, Ergocalciferol, (DRISDOL) 1.25 MG (50000 UNIT) CAPS capsule TAKE 1 CAPSULE BY MOUTH ONE TIME PER WEEK 12 capsule 0   WIXELA INHUB 250-50 MCG/ACT AEPB INHALE 1 PUFF INTO THE LUNGS IN THE MORNING AND AT BEDTIME. 60 each 1   No current facility-administered medications  for this visit.    PHYSICAL EXAMINATION: ECOG PERFORMANCE STATUS: 1 - Symptomatic but completely ambulatory  Vitals:   10/09/22 0956  BP: (!) 163/88  Pulse: (!) 108  Resp: 18  Temp: (!) 97.2 F (36.2 C)  SpO2: 98%   Filed Weights   10/09/22 0956  Weight: 159 lb 4.8 oz (72.3 kg)      LABORATORY DATA:  I have reviewed the data as listed    Latest Ref Rng & Units 10/09/2022    9:17 AM 09/26/2022    2:43 PM 09/13/2022    1:55 PM  CMP  Glucose 70 - 99 mg/dL 161  096  045    BUN 8 - 23 mg/dL 20  18  15    Creatinine 0.44 - 1.00 mg/dL 4.09  8.11  9.14   Sodium 135 - 145 mmol/L 135  129  131   Potassium 3.5 - 5.1 mmol/L 5.1  4.8  4.5   Chloride 98 - 111 mmol/L 101  96  95   CO2 22 - 32 mmol/L 29  26  24    Calcium 8.9 - 10.3 mg/dL 9.5  9.1  9.3   Total Protein 6.5 - 8.1 g/dL 6.8     Total Bilirubin 0.3 - 1.2 mg/dL 0.5     Alkaline Phos 38 - 126 U/L 36     AST 15 - 41 U/L 14     ALT 0 - 44 U/L 15       Lab Results  Component Value Date   WBC 7.0 10/09/2022   HGB 13.5 10/09/2022   HCT 39.9 10/09/2022   MCV 92.8 10/09/2022   PLT 195 10/09/2022   NEUTROABS 4.2 10/09/2022    ASSESSMENT & PLAN:  Malignant neoplasm of upper-inner quadrant of left breast in female, estrogen receptor positive (HCC) 08/30/2022:Screening mammogram detected left breast mass 6 mm by ultrasound.  Biopsy UIQ: ADH involving small intraductal papilloma/radial sclerosing lesion.  Mass upper inner quadrant: Grade 2 IDC with mucinous features with DCIS low-grade, ER 100%, PR 100%, Ki-67 2%, HER2 1+ negative   09/19/2022: Left lumpectomy: Grade 2 IDC with extracellular mucin 0.7 cm with DCIS intermediate grade, margins negative, for vascular invasion not identified, lateral margin: DCIS involving intraductal papilloma, all margins negative, ER 100%, PR 100%, HER2 1+, Ki-67 2%  Pathology counseling: I discussed the final pathology report of the patient provided  a copy of this report. I discussed the margins as well as lymph node surgeries. We also discussed the final staging along with previously performed ER/PR and HER-2/neu testing.  Treatment plan: Antiestrogen therapy with anastrozole 1 mg daily x 5 years Anastrozole counseling: We discussed the risks and benefits of anti-estrogen therapy with aromatase inhibitors. These include but not limited to insomnia, hot flashes, mood changes, vaginal dryness, bone density loss, and weight gain. We strongly believe that the benefits far outweigh the  risks. Patient understands these risks and consented to starting treatment. Planned treatment duration is 5 years.  Patient has osteoporosis and takes Prolia injections.  Last bone density T-score -2.7.  Return to clinic 3 months for survivorship care plan visit    No orders of the defined types were placed in this encounter.  The patient has a good understanding of the overall plan. she agrees with it. she will call with any problems that may develop before the next visit here. Total time spent: 30 mins including face to face time and time spent for planning, charting and co-ordination of care  Tamsen Meek, MD 10/09/22    I Janan Ridge am acting as a Neurosurgeon for [employer]  I have reviewed the above documentation for accuracy and completeness, and I agree with the above.

## 2022-10-09 ENCOUNTER — Ambulatory Visit: Payer: Medicare HMO

## 2022-10-09 ENCOUNTER — Inpatient Hospital Stay: Payer: Medicare HMO | Attending: Hematology and Oncology | Admitting: Hematology and Oncology

## 2022-10-09 ENCOUNTER — Inpatient Hospital Stay: Payer: Medicare HMO

## 2022-10-09 VITALS — BP 163/88 | HR 108 | Temp 97.2°F | Resp 18 | Ht 60.0 in | Wt 159.3 lb

## 2022-10-09 DIAGNOSIS — C50212 Malignant neoplasm of upper-inner quadrant of left female breast: Secondary | ICD-10-CM | POA: Diagnosis not present

## 2022-10-09 DIAGNOSIS — Z170 Estrogen receptor positive status [ER+]: Secondary | ICD-10-CM

## 2022-10-09 LAB — CBC WITH DIFFERENTIAL (CANCER CENTER ONLY)
Abs Immature Granulocytes: 0.03 10*3/uL (ref 0.00–0.07)
Basophils Absolute: 0.1 10*3/uL (ref 0.0–0.1)
Basophils Relative: 1 %
Eosinophils Absolute: 0.3 10*3/uL (ref 0.0–0.5)
Eosinophils Relative: 4 %
HCT: 39.9 % (ref 36.0–46.0)
Hemoglobin: 13.5 g/dL (ref 12.0–15.0)
Immature Granulocytes: 0 %
Lymphocytes Relative: 22 %
Lymphs Abs: 1.5 10*3/uL (ref 0.7–4.0)
MCH: 31.4 pg (ref 26.0–34.0)
MCHC: 33.8 g/dL (ref 30.0–36.0)
MCV: 92.8 fL (ref 80.0–100.0)
Monocytes Absolute: 0.9 10*3/uL (ref 0.1–1.0)
Monocytes Relative: 13 %
Neutro Abs: 4.2 10*3/uL (ref 1.7–7.7)
Neutrophils Relative %: 60 %
Platelet Count: 195 10*3/uL (ref 150–400)
RBC: 4.3 MIL/uL (ref 3.87–5.11)
RDW: 12.4 % (ref 11.5–15.5)
WBC Count: 7 10*3/uL (ref 4.0–10.5)
nRBC: 0 % (ref 0.0–0.2)

## 2022-10-09 LAB — CMP (CANCER CENTER ONLY)
ALT: 15 U/L (ref 0–44)
AST: 14 U/L — ABNORMAL LOW (ref 15–41)
Albumin: 4.4 g/dL (ref 3.5–5.0)
Alkaline Phosphatase: 36 U/L — ABNORMAL LOW (ref 38–126)
Anion gap: 5 (ref 5–15)
BUN: 20 mg/dL (ref 8–23)
CO2: 29 mmol/L (ref 22–32)
Calcium: 9.5 mg/dL (ref 8.9–10.3)
Chloride: 101 mmol/L (ref 98–111)
Creatinine: 0.66 mg/dL (ref 0.44–1.00)
GFR, Estimated: >60 mL/min
Glucose, Bld: 100 mg/dL — ABNORMAL HIGH (ref 70–99)
Potassium: 5.1 mmol/L (ref 3.5–5.1)
Sodium: 135 mmol/L (ref 135–145)
Total Bilirubin: 0.5 mg/dL (ref 0.3–1.2)
Total Protein: 6.8 g/dL (ref 6.5–8.1)

## 2022-10-09 MED ORDER — ANASTROZOLE 1 MG PO TABS: 1.0000 mg | ORAL_TABLET | Freq: Every day | ORAL | 3 refills | Status: DC

## 2022-10-09 NOTE — Assessment & Plan Note (Addendum)
08/30/2022:Screening mammogram detected left breast mass 6 mm by ultrasound.  Biopsy UIQ: ADH involving small intraductal papilloma/radial sclerosing lesion.  Mass upper inner quadrant: Grade 2 IDC with mucinous features with DCIS low-grade, ER 100%, PR 100%, Ki-67 2%, HER2 1+ negative   09/19/2022: Left lumpectomy: Grade 2 IDC with extracellular mucin 0.7 cm with DCIS intermediate grade, margins negative, for vascular invasion not identified, lateral margin: DCIS involving intraductal papilloma, all margins negative, ER 100%, PR 100%, HER2 1+, Ki-67 2%  Pathology counseling: I discussed the final pathology report of the patient provided  a copy of this report. I discussed the margins as well as lymph node surgeries. We also discussed the final staging along with previously performed ER/PR and HER-2/neu testing.  Treatment plan: Antiestrogen therapy with anastrozole 1 mg daily x 5 years Anastrozole counseling: We discussed the risks and benefits of anti-estrogen therapy with aromatase inhibitors. These include but not limited to insomnia, hot flashes, mood changes, vaginal dryness, bone density loss, and weight gain. We strongly believe that the benefits far outweigh the risks. Patient understands these risks and consented to starting treatment. Planned treatment duration is 5 years.  Patient has osteoporosis and takes Prolia injections.  Last bone density T-score -2.7.  Return to clinic 3 months for survivorship care plan visit

## 2022-10-10 ENCOUNTER — Encounter: Payer: Self-pay | Admitting: *Deleted

## 2022-10-10 DIAGNOSIS — C50212 Malignant neoplasm of upper-inner quadrant of left female breast: Secondary | ICD-10-CM

## 2022-10-11 ENCOUNTER — Telehealth: Payer: Self-pay

## 2022-10-11 NOTE — Telephone Encounter (Signed)
Pt called and LVM requesting copy of lab results to be mailed to her home.  Called pt back and LVM asking pt to confirm her mailing address since we will be mailing PHI.

## 2022-10-15 DIAGNOSIS — M47816 Spondylosis without myelopathy or radiculopathy, lumbar region: Secondary | ICD-10-CM | POA: Diagnosis not present

## 2022-10-17 ENCOUNTER — Other Ambulatory Visit: Payer: Self-pay | Admitting: Internal Medicine

## 2022-10-18 ENCOUNTER — Other Ambulatory Visit: Payer: Self-pay | Admitting: Internal Medicine

## 2022-10-22 NOTE — Progress Notes (Signed)
Spoke to pt and instructed them to come at 1030 and to be NPO after 0000. Confirmed no missed doses of AC and instructed to take in AM with a small sip of water.   Confirmed that pt will have a ride home and someone to stay with them for 24 hours after the procedure.

## 2022-10-23 ENCOUNTER — Ambulatory Visit (HOSPITAL_COMMUNITY): Payer: Medicare HMO | Admitting: Anesthesiology

## 2022-10-23 ENCOUNTER — Ambulatory Visit (HOSPITAL_COMMUNITY)
Admission: RE | Admit: 2022-10-23 | Discharge: 2022-10-23 | Disposition: A | Discharge status: Home / Self Care | Payer: Medicare HMO | Attending: Cardiovascular Disease | Admitting: Cardiovascular Disease

## 2022-10-23 ENCOUNTER — Encounter (HOSPITAL_COMMUNITY)
Admission: RE | Disposition: A | Discharge status: Home / Self Care | Payer: Self-pay | Source: Home / Self Care | Attending: Cardiovascular Disease

## 2022-10-23 ENCOUNTER — Ambulatory Visit (HOSPITAL_COMMUNITY)
Admission: RE | Admit: 2022-10-23 | Discharge: 2022-10-23 | Disposition: A | Discharge status: Home / Self Care | Payer: Medicare HMO | Source: Ambulatory Visit | Attending: Internal Medicine | Admitting: Internal Medicine

## 2022-10-23 ENCOUNTER — Other Ambulatory Visit: Payer: Self-pay

## 2022-10-23 VITALS — HR 107

## 2022-10-23 DIAGNOSIS — Z7901 Long term (current) use of anticoagulants: Secondary | ICD-10-CM | POA: Insufficient documentation

## 2022-10-23 DIAGNOSIS — Z87891 Personal history of nicotine dependence: Secondary | ICD-10-CM

## 2022-10-23 DIAGNOSIS — J449 Chronic obstructive pulmonary disease, unspecified: Secondary | ICD-10-CM

## 2022-10-23 DIAGNOSIS — I4811 Longstanding persistent atrial fibrillation: Secondary | ICD-10-CM

## 2022-10-23 DIAGNOSIS — I739 Peripheral vascular disease, unspecified: Secondary | ICD-10-CM | POA: Diagnosis not present

## 2022-10-23 DIAGNOSIS — I10 Essential (primary) hypertension: Secondary | ICD-10-CM

## 2022-10-23 DIAGNOSIS — I4819 Other persistent atrial fibrillation: Secondary | ICD-10-CM

## 2022-10-23 DIAGNOSIS — J4489 Other specified chronic obstructive pulmonary disease: Secondary | ICD-10-CM | POA: Insufficient documentation

## 2022-10-23 DIAGNOSIS — I4891 Unspecified atrial fibrillation: Secondary | ICD-10-CM | POA: Insufficient documentation

## 2022-10-23 HISTORY — PX: CARDIOVERSION: SHX1299

## 2022-10-23 SURGERY — CARDIOVERSION
Anesthesia: Monitor Anesthesia Care

## 2022-10-23 MED ORDER — LIDOCAINE 2% (20 MG/ML) 5 ML SYRINGE
INTRAMUSCULAR | Status: DC | PRN
  Administered 2022-10-23: 60 mg via INTRAVENOUS

## 2022-10-23 MED ORDER — PROPOFOL 10 MG/ML IV BOLUS
INTRAVENOUS | Status: DC | PRN
Start: 2022-10-23 — End: 2022-10-23
  Administered 2022-10-23: 60 mg via INTRAVENOUS

## 2022-10-23 MED ORDER — SODIUM CHLORIDE 0.9 % IV SOLN: INTRAVENOUS | Status: DC

## 2022-10-23 SURGICAL SUPPLY — 1 items: ELECT DEFIB PAD ADLT CADENCE (PAD) ×1 IMPLANT

## 2022-10-23 NOTE — CV Procedure (Signed)
DCC: On Rx eliquis no missed doses Anesthesia: Propofol  DCC x 1 150 J biphasic  Converted from afib rate 110 to NSR rate 74 bpm  No immediate neurologic sequelae  Charlton Haws MD [facility]

## 2022-10-23 NOTE — Progress Notes (Signed)
Patient here for ECG recheck prior to DCCV.  ECG today shows  Vent. rate 107 BPM PR interval * ms QRS duration 78 ms QT/QTcB 310/413 ms P-R-T axes * -38 38 Atrial fibrillation with rapid ventricular response Left axis deviation Low voltage QRS Inferior infarct , age undetermined Abnormal ECG When compared with ECG of 26-Sep-2022 13:50, PREVIOUS ECG IS PRESENT   Pt will follow up after DCCV.

## 2022-10-23 NOTE — Interval H&P Note (Signed)
History and Physical Interval Note:  10/23/2022 12:21 PM  Lacey Brown  has presented today for surgery, with the diagnosis of AFIB.  The various methods of treatment have been discussed with the patient and family. After consideration of risks, benefits and other options for treatment, the patient has consented to  Procedure(s): CARDIOVERSION (N/A) as a surgical intervention.  [name] history has been reviewed, patient examined, no change in status, stable for surgery.  I have reviewed the patient's chart and labs.  Questions were answered to the patient's satisfaction.     Charlton Haws

## 2022-10-23 NOTE — Anesthesia Preprocedure Evaluation (Addendum)
Anesthesia Evaluation  Patient identified by MRN, date of birth, ID band Patient awake    Reviewed: Allergy & Precautions, NPO status , Patient's Chart, lab work & pertinent test results  Airway Mallampati: II  TM Distance: >3 FB Neck ROM: Full    Dental no notable dental hx.    Pulmonary asthma , COPD,  COPD inhaler, former smoker   Pulmonary exam normal        Cardiovascular hypertension, Pt. on medications and Pt. on home beta blockers + Peripheral Vascular Disease  Normal cardiovascular exam+ dysrhythmias Atrial Fibrillation      Neuro/Psych negative neurological ROS  negative psych ROS   GI/Hepatic Neg liver ROS,GERD  Medicated and Controlled,,  Endo/Other  negative endocrine ROS    Renal/GU negative Renal ROS     Musculoskeletal  (+) Arthritis ,    Abdominal  (+) + obese  Peds  Hematology  (+) Blood dyscrasia (Eliquis)   Anesthesia Other Findings A-FIB  Reproductive/Obstetrics                             Anesthesia Physical Anesthesia Plan  ASA: 3  Anesthesia Plan: MAC   Post-op Pain Management:    Induction:   PONV Risk Score and Plan: 2 and Propofol infusion and Treatment may vary due to age or medical condition  Airway Management Planned: Simple Face Mask  Additional Equipment:   Intra-op Plan:   Post-operative Plan:   Informed Consent: I have reviewed the patients History and Physical, chart, labs and discussed the procedure including the risks, benefits and alternatives for the proposed anesthesia with the patient or authorized representative who has indicated his/her understanding and acceptance.       Plan Discussed with: CRNA  Anesthesia Plan Comments:        Anesthesia Quick Evaluation

## 2022-10-23 NOTE — H&P (View-Only) (Signed)
Patient here for ECG recheck prior to DCCV.  ECG today shows  Vent. rate 107 BPM PR interval * ms QRS duration 78 ms QT/QTcB 310/413 ms P-R-T axes * -38 38 Atrial fibrillation with rapid ventricular response Left axis deviation Low voltage QRS Inferior infarct , age undetermined Abnormal ECG When compared with ECG of 26-Sep-2022 13:50, PREVIOUS ECG IS PRESENT   Pt will follow up after DCCV.

## 2022-10-23 NOTE — Transfer of Care (Signed)
Immediate Anesthesia Transfer of Care Note  Patient: Kriste Basque  Procedure(s) Performed: CARDIOVERSION  Patient Location: Cath Lab  Anesthesia Type:General  Level of Consciousness: drowsy, patient cooperative, and responds to stimulation  Airway & Oxygen Therapy: Patient Spontanous Breathing and Patient connected to nasal cannula oxygen  Post-op Assessment: Report given to RN and Post -op Vital signs reviewed and stable  Post vital signs: Reviewed and stable  Last Vitals:  Vitals Value Taken Time  BP 123/69 10/23/22 1203  Temp 36.4 C 10/23/22 1203  Pulse 75 10/23/22 1203  Resp 22 10/23/22 1203  SpO2 99 % 10/23/22 1203  Vitals shown include unfiled device data.  Last Pain:  Vitals:   10/23/22 1203  TempSrc: Temporal  PainSc: 0-No pain         Complications: No notable events documented.

## 2022-10-24 ENCOUNTER — Encounter (HOSPITAL_COMMUNITY): Payer: Self-pay | Admitting: Cardiovascular Disease

## 2022-10-24 NOTE — Anesthesia Postprocedure Evaluation (Signed)
Anesthesia Post Note  Patient: Lacey Brown  Procedure(s) Performed: CARDIOVERSION     Patient location during evaluation: Cath Lab Anesthesia Type: MAC Level of consciousness: awake Pain management: pain level controlled Vital Signs Assessment: post-procedure vital signs reviewed and stable Respiratory status: spontaneous breathing, nonlabored ventilation and respiratory function stable Cardiovascular status: blood pressure returned to baseline and stable Postop Assessment: no apparent nausea or vomiting Anesthetic complications: no   No notable events documented.  Last Vitals:  Vitals:   10/23/22 1210 10/23/22 1220  BP: 110/74 138/83  Pulse: 74 66  Resp: (!) 23 20  Temp:  36.7 C  SpO2: 100% 97%    Last Pain:  Vitals:   10/23/22 1220  TempSrc: Temporal  PainSc: 0-No pain                 Marshe Shrestha P Yarexi Pawlicki

## 2022-11-04 ENCOUNTER — Ambulatory Visit (HOSPITAL_COMMUNITY): Payer: Medicare HMO | Admitting: Internal Medicine

## 2022-11-07 ENCOUNTER — Telehealth: Payer: Self-pay | Admitting: Internal Medicine

## 2022-11-07 NOTE — Telephone Encounter (Signed)
Pt is aware and gave a verbal understanding.  

## 2022-11-07 NOTE — Telephone Encounter (Signed)
Pt would like to know if she should get the RSV shot

## 2022-11-11 ENCOUNTER — Ambulatory Visit (HOSPITAL_COMMUNITY)
Admission: RE | Admit: 2022-11-11 | Discharge: 2022-11-11 | Disposition: A | Discharge status: Home / Self Care | Payer: Medicare HMO | Source: Ambulatory Visit | Attending: Internal Medicine | Admitting: Internal Medicine

## 2022-11-11 VITALS — BP 160/74 | HR 80 | Ht 60.0 in | Wt 159.6 lb

## 2022-11-11 DIAGNOSIS — I10 Essential (primary) hypertension: Secondary | ICD-10-CM | POA: Insufficient documentation

## 2022-11-11 DIAGNOSIS — I4819 Other persistent atrial fibrillation: Secondary | ICD-10-CM | POA: Insufficient documentation

## 2022-11-11 DIAGNOSIS — I480 Paroxysmal atrial fibrillation: Secondary | ICD-10-CM | POA: Diagnosis not present

## 2022-11-11 DIAGNOSIS — Z7901 Long term (current) use of anticoagulants: Secondary | ICD-10-CM | POA: Diagnosis not present

## 2022-11-11 DIAGNOSIS — D6869 Other thrombophilia: Secondary | ICD-10-CM | POA: Diagnosis not present

## 2022-11-11 NOTE — Progress Notes (Signed)
Primary Care Physician: Sherlene Shams, MD Primary Cardiologist: Donato Schultz, MD Electrophysiologist: None     Referring Physician: Dr. [name] is a 82 y.o. female with a history of HTN, HLD, obesity, asthma, left breast cancer, chronic DOE, mildly dilated ascending aorta, and paroxysmal atrial fibrillation who presents for consultation in the [facility]. Noted to be in new onset rate controlled Afib at preop testing appointment. Recommended by Dr. Anne Fu to begin Eliquis after surgery on 8/15. Patient has a CHADS2VASC score of 4.  On evaluation today, she is currently in rate controlled Afib. She does not appear have cardiac awareness of Afib; may feel more tired. She is not on anticoagulant currently. She drinks alcohol quite possibly on a daily basis. She may snore.   On follow up 11/11/22, she is currently in NSR. S/p successful DCCV on 10/23/22. No bleeding issues on Eliquis. She feels better and more energy and less SOB.   Today, she denies symptoms of palpitations, chest pain, shortness of breath, orthopnea, PND, lower extremity edema, dizziness, presyncope, syncope, snoring, daytime somnolence, bleeding, or neurologic sequela. The patient is tolerating medications without difficulties and is otherwise without complaint today.   she has a BMI of Body mass index is 31.17 kg/m.Marland Kitchen Filed Weights   11/11/22 1312  Weight: 72.4 kg     Current Outpatient Medications  Medication Sig Dispense Refill   acetaminophen (TYLENOL) 650 MG CR tablet Take 2 tablets by mouth 2 (two) times daily.     albuterol (VENTOLIN HFA) 108 (90 Base) MCG/ACT inhaler Inhale 2 puffs into the lungs every 6 (six) hours as needed for wheezing or shortness of breath. 3.7 g 11   amLODipine (NORVASC) 2.5 MG tablet TAKE 1 TABLET BY MOUTH EVERY DAY 90 tablet 2   anastrozole (ARIMIDEX) 1 MG tablet Take 1 tablet (1 mg total) by mouth daily. 90 tablet 3   apixaban  (ELIQUIS) 5 MG TABS tablet Take 1 tablet (5 mg total) by mouth 2 (two) times daily. 180 tablet 6   denosumab (PROLIA) 60 MG/ML SOSY injection Inject 60 mg into the skin every 6 (six) months.     metoprolol succinate (TOPROL-XL) 25 MG 24 hr tablet Take 1 tablet (25 mg total) by mouth daily. 90 tablet 1   omeprazole (PRILOSEC OTC) 20 MG tablet Take 1 tablet (20 mg total) by mouth daily. In the morning on an empty stomach (Patient taking differently: Take 20 mg by mouth daily as needed (acid reflux).) 90 tablet 1   rosuvastatin (CRESTOR) 10 MG tablet Take 1 tablet (10 mg total) by mouth daily. 90 tablet 1   telmisartan (MICARDIS) 80 MG tablet TAKE 1 TABLET BY MOUTH EVERY DAY 90 tablet 0   trolamine salicylate (ASPERCREME) 10 % cream Apply 1 Application topically as needed for muscle pain.     Vitamin D, Ergocalciferol, (DRISDOL) 1.25 MG (50000 UNIT) CAPS capsule TAKE 1 CAPSULE BY MOUTH ONE TIME PER WEEK (Patient taking differently: Take 50,000 Units by mouth every Sunday.) 12 capsule 0   WIXELA INHUB 250-50 MCG/ACT AEPB INHALE 1 PUFF INTO THE LUNGS IN THE MORNING AND AT BEDTIME. 60 each 1   No current facility-administered medications for this encounter.    Atrial Fibrillation Management history:  Previous antiarrhythmic drugs: None Previous cardioversions: 10/23/22 Previous ablations: None Anticoagulation history: Eliquis   ROS- All systems are reviewed and negative except as per the HPI above.  Physical Exam: Ht 5' (  1.524 m)   Wt 72.4 kg   BMI 31.17 kg/m   GEN- The patient is well appearing, alert and oriented x 3 today.   Neck - no JVD or carotid bruit noted Lungs- Clear to ausculation bilaterally, normal work of breathing Heart- Regular rate and rhythm, no murmurs, rubs or gallops, PMI not laterally displaced Extremities- no clubbing, cyanosis, or edema Skin - no rash or ecchymosis noted   EKG today demonstrates  Vent. rate 80 BPM PR interval 210 ms QRS duration 70 ms QT/QTcB  348/401 ms P-R-T axes 63 61 49 Sinus rhythm with 1st degree A-V block Low voltage QRS Borderline ECG When compared with ECG of 23-Oct-2022 12:05, PREVIOUS ECG IS PRESENT  Echo 01/16/21 demonstrated   1. Left ventricular ejection fraction, by estimation, is 60 to 65%. Left  ventricular ejection fraction by 3D volume is 64 %. The left ventricle has  normal function. The left ventricle has no regional wall motion  abnormalities. There is mild left  ventricular hypertrophy. Left ventricular diastolic parameters are  consistent with Grade I diastolic dysfunction (impaired relaxation). The  average left ventricular global longitudinal strain is -18.9 %. The global  longitudinal strain is normal.   2. Right ventricular systolic function is normal. The right ventricular  size is normal. There is mildly elevated pulmonary artery systolic  pressure. The estimated right ventricular systolic pressure is 37.3 mmHg.   3. Left atrial size was moderately dilated.   4. The mitral valve is abnormal. Trivial mitral valve regurgitation.   5. The aortic valve is tricuspid. Aortic valve regurgitation is trivial.  Aortic valve sclerosis is present, with no evidence of aortic valve  stenosis.   6. Aortic dilatation noted. There is mild dilatation of the ascending  aorta, measuring 41 mm.   7. The inferior vena cava is normal in size with greater than 50%  respiratory variability, suggesting right atrial pressure of 3 mmHg.   ASSESSMENT & PLAN CHA2DS2-VASc Score = 4  [name] score is based upon: CHF History: 0 HTN History: 1 Diabetes History: 0 Stroke History: 0 Vascular Disease History: 0 Age Score: 2 Gender Score: 1       ASSESSMENT AND PLAN: Persistent Atrial Fibrillation (ICD10:  I48.19) [name] CHA2DS2-VASc score is 4, indicating a 4.8% annual risk of stroke.   S/p successful DCCV on 10/23/22.   She is currently in NSR. Rhythm monitoring device recommended. Continue  conservative observation for now.  Secondary Hypercoagulable State (ICD10:  D68.69) The patient is at significant risk for stroke/thromboembolism based upon her CHA2DS2-VASc Score of 4.  Start Apixaban (Eliquis).  Continue Eliquis 5 mg BID without interruption.     Follow up 6 months Afib clinic.   Lake Bells, PA-C  Afib Clinic [facility] [address] [place], [place] [address] [phone]

## 2022-11-11 NOTE — Patient Instructions (Signed)
Kardia

## 2022-11-12 ENCOUNTER — Other Ambulatory Visit: Payer: Self-pay | Admitting: Internal Medicine

## 2022-11-13 ENCOUNTER — Telehealth: Payer: Self-pay

## 2022-11-13 ENCOUNTER — Ambulatory Visit: Payer: Medicare HMO | Admitting: Orthopaedic Surgery

## 2022-11-13 DIAGNOSIS — M25512 Pain in left shoulder: Secondary | ICD-10-CM

## 2022-11-13 DIAGNOSIS — M25562 Pain in left knee: Secondary | ICD-10-CM | POA: Diagnosis not present

## 2022-11-13 DIAGNOSIS — G8929 Other chronic pain: Secondary | ICD-10-CM

## 2022-11-13 MED ORDER — METHYLPREDNISOLONE ACETATE 40 MG/ML IJ SUSP
40.0000 mg | INTRAMUSCULAR | Status: AC | PRN
Start: 2022-11-13 — End: 2022-11-13
  Administered 2022-11-13: 40 mg via INTRA_ARTICULAR

## 2022-11-13 MED ORDER — LIDOCAINE HCL 1 % IJ SOLN
3.0000 mL | INTRAMUSCULAR | Status: AC | PRN
Start: 2022-11-13 — End: 2022-11-13
  Administered 2022-11-13: 3 mL

## 2022-11-13 NOTE — Telephone Encounter (Signed)
Patient states she has had a headache for the last few days and she is wondering if it's related to the medications she may be taking.  Patient states she has had some moments of dizziness, but she is not dizzy right now.  I scheduled an appointment with Dr. Duncan Dull on 11/15/2022.  I transferred call to Access Nurse.

## 2022-11-13 NOTE — Progress Notes (Signed)
The patient is an 82 year old female who comes in today requesting a steroid injection in her left knee and in her left shoulder.  We have injected these areas before and has been over 3 months since each injection.  She would rather have this type of treatment than anything else.  She is on blood thinning medication she is she cannot take anti-inflammatories.  She did have a cardioversion earlier this year and she says that she thinks is treated her A-fib.  She would like to come off of blood thinners.  She has had no other acute change in medical status.  She is not a diabetic.  Her left shoulder shows some weakness in the rotator cuff and pain throughout the arc of motion.  Her left knee also has pain throughout the arc of motion with crepitation.  We have imaged these areas in the past.  I did place a left shoulder steroid injection in the subacromial outlet and the left knee joint steroid injection which she tolerated both well.  She would like to try these again in 3 months if she needs it.     Procedure Note  Patient: Lacey Brown             Date of Birth: 1940-10-18           MRN: [id number]             Visit Date: 11/13/2022  Procedures: Visit Diagnoses:  1. Chronic left shoulder pain   2. Chronic pain of left knee     Large Joint Inj: L knee on 11/13/2022 1:56 PM Indications: diagnostic evaluation and pain Details: 22 G 1.5 in needle, superolateral approach  Arthrogram: No  Medications: 3 mL lidocaine 1 %; 40 mg methylPREDNISolone acetate 40 MG/ML Outcome: tolerated well, no immediate complications Procedure, treatment alternatives, risks and benefits explained, specific risks discussed. Consent was given by the patient. Immediately prior to procedure a time out was called to verify the correct patient, procedure, equipment, support staff and site/side marked as required. Patient was prepped and draped in the usual sterile fashion.    Large Joint Inj: L subacromial  bursa on 11/13/2022 1:57 PM Indications: pain and diagnostic evaluation Details: 22 G 1.5 in needle  Arthrogram: No  Medications: 3 mL lidocaine 1 %; 40 mg methylPREDNISolone acetate 40 MG/ML Outcome: tolerated well, no immediate complications Procedure, treatment alternatives, risks and benefits explained, specific risks discussed. Consent was given by the patient. Immediately prior to procedure a time out was called to verify the correct patient, procedure, equipment, support staff and site/side marked as required. Patient was prepped and draped in the usual sterile fashion.

## 2022-11-13 NOTE — Telephone Encounter (Signed)
Spoke with pt and she stated that she feels like maybe her medications are causing her to have dizzy spells. Pt stated that the spells come and go. Pt has been scheduled to see Dr. Darrick Huntsman on Friday.

## 2022-11-13 NOTE — Telephone Encounter (Signed)
Pt does not have a way to check her bp but she was at her cardiology appt yesterday and her bp was 160/74.

## 2022-11-13 NOTE — Telephone Encounter (Signed)
Access nurse was unable to reach pt.

## 2022-11-15 ENCOUNTER — Encounter: Payer: Self-pay | Admitting: Internal Medicine

## 2022-11-15 ENCOUNTER — Ambulatory Visit (INDEPENDENT_AMBULATORY_CARE_PROVIDER_SITE_OTHER): Payer: Medicare HMO | Admitting: Internal Medicine

## 2022-11-15 VITALS — BP 130/60 | HR 75 | Ht 60.0 in | Wt 160.4 lb

## 2022-11-15 DIAGNOSIS — R519 Headache, unspecified: Secondary | ICD-10-CM | POA: Diagnosis not present

## 2022-11-15 DIAGNOSIS — I4819 Other persistent atrial fibrillation: Secondary | ICD-10-CM | POA: Diagnosis not present

## 2022-11-15 DIAGNOSIS — M810 Age-related osteoporosis without current pathological fracture: Secondary | ICD-10-CM

## 2022-11-15 DIAGNOSIS — K2900 Acute gastritis without bleeding: Secondary | ICD-10-CM | POA: Diagnosis not present

## 2022-11-15 MED ORDER — METOPROLOL SUCCINATE ER 25 MG PO TB24
25.0000 mg | ORAL_TABLET | Freq: Every day | ORAL | 1 refills | Status: DC
Start: 2022-11-15 — End: 2023-02-11

## 2022-11-15 MED ORDER — TELMISARTAN 80 MG PO TABS: 80.0000 mg | ORAL_TABLET | Freq: Every day | ORAL | 1 refills | Status: DC

## 2022-11-15 MED ORDER — ROSUVASTATIN CALCIUM 10 MG PO TABS: 10.0000 mg | ORAL_TABLET | Freq: Every day | ORAL | 1 refills | Status: DC

## 2022-11-15 MED ORDER — TIZANIDINE HCL 4 MG PO TABS
4.0000 mg | ORAL_TABLET | Freq: Every day | ORAL | 0 refills | Status: DC
Start: 2022-11-15 — End: 2022-12-06

## 2022-11-15 NOTE — Progress Notes (Signed)
Subjective:  Patient ID: Lacey Brown, female    DOB: 1940/08/03  Age: 82 y.o. MRN: [id number]  CC: The primary encounter diagnosis was New onset of headache in cancer patient. Diagnoses of Acute gastritis, presence of bleeding unspecified, unspecified gastritis type, Persistent atrial fibrillation (HCC), and Osteoporosis, unspecified osteoporosis type, unspecified pathological fracture presence were also pertinent to this visit.   HPI Lacey Brown presents for  Chief Complaint  Patient presents with   Headache  Last seen by me in May  Since her last visit   1) Left breast CA diagnosed after abnormal mammogram July 2024.  Taking arimidex since Sept 1    2) New onset atrial fibrillation August 12 during surgery for breast CA..  started on Eliquis August 22 and had her  DCCV 3 weeks ago  she is in sinus rhythm  3) New onset headache started one week ago after playing cards the night before.  Woke up with neck stiffness/mcramping.  No fevers or vision changes described "like a cap sitting on my  head" accompanied by facial pressure. Not present when she wakes up. Starts later in the morning     Starting taking anastrozole last month for treatment of breast cancer   Outpatient Medications Prior to Visit  Medication Sig Dispense Refill   acetaminophen (TYLENOL) 650 MG CR tablet Take 2 tablets by mouth 2 (two) times daily.     albuterol (VENTOLIN HFA) 108 (90 Base) MCG/ACT inhaler Inhale 2 puffs into the lungs every 6 (six) hours as needed for wheezing or shortness of breath. 3.7 g 11   amLODipine (NORVASC) 2.5 MG tablet TAKE 1 TABLET BY MOUTH EVERY DAY 90 tablet 2   anastrozole (ARIMIDEX) 1 MG tablet Take 1 tablet (1 mg total) by mouth daily. 90 tablet 3   apixaban (ELIQUIS) 5 MG TABS tablet Take 1 tablet (5 mg total) by mouth 2 (two) times daily. 180 tablet 6   denosumab (PROLIA) 60 MG/ML SOSY injection Inject 60 mg into the skin every 6 (six) months.     omeprazole  (PRILOSEC OTC) 20 MG tablet Take 1 tablet (20 mg total) by mouth daily. In the morning on an empty stomach (Patient taking differently: Take 20 mg by mouth daily as needed (acid reflux).) 90 tablet 1   trolamine salicylate (ASPERCREME) 10 % cream Apply 1 Application topically as needed for muscle pain.     Vitamin D, Ergocalciferol, (DRISDOL) 1.25 MG (50000 UNIT) CAPS capsule TAKE 1 CAPSULE BY MOUTH ONE TIME PER WEEK (Patient taking differently: Take 50,000 Units by mouth every Sunday.) 12 capsule 0   WIXELA INHUB 250-50 MCG/ACT AEPB INHALE 1 PUFF INTO THE LUNGS IN THE MORNING AND AT BEDTIME. 60 each 1   metoprolol succinate (TOPROL-XL) 25 MG 24 hr tablet Take 1 tablet (25 mg total) by mouth daily. 90 tablet 1   rosuvastatin (CRESTOR) 10 MG tablet Take 1 tablet (10 mg total) by mouth daily. 90 tablet 1   telmisartan (MICARDIS) 80 MG tablet TAKE 1 TABLET BY MOUTH EVERY DAY 90 tablet 0   No facility-administered medications prior to visit.    Review of Systems;  Patient denies headache, fevers, malaise, unintentional weight loss, skin rash, eye pain, sinus congestion and sinus pain, sore throat, dysphagia,  hemoptysis , cough, dyspnea, wheezing, chest pain, palpitations, orthopnea, edema, abdominal pain, nausea, melena, diarrhea, constipation, flank pain, dysuria, hematuria, urinary  Frequency, nocturia, numbness, tingling, seizures,  Focal weakness, Loss of consciousness,  Tremor, insomnia, depression,  anxiety, and suicidal ideation.      Objective:  BP 130/60   Pulse 75   Ht 5' (1.524 m)   Wt 160 lb 6.4 oz (72.8 kg)   SpO2 96%   BMI 31.33 kg/m   BP Readings from Last 3 Encounters:  11/15/22 130/60  11/11/22 (!) 160/74  10/23/22 138/83    Wt Readings from Last 3 Encounters:  11/15/22 160 lb 6.4 oz (72.8 kg)  11/11/22 159 lb 9.6 oz (72.4 kg)  10/23/22 158 lb (71.7 kg)    Physical Exam Vitals reviewed.  Constitutional:      General: She is not in acute distress.    Appearance:  Normal appearance. She is normal weight. She is not ill-appearing, toxic-appearing or diaphoretic.  HENT:     Head: Normocephalic.  Eyes:     General: No visual field deficit or scleral icterus.       Right eye: No discharge.        Left eye: No discharge.     Conjunctiva/sclera: Conjunctivae normal.  Cardiovascular:     Rate and Rhythm: Normal rate and regular rhythm.     Heart sounds: Normal heart sounds.  Pulmonary:     Effort: Pulmonary effort is normal. No respiratory distress.     Breath sounds: Normal breath sounds.  Musculoskeletal:        General: Normal range of motion.  Skin:    General: Skin is warm and dry.  Neurological:     General: No focal deficit present.     Mental Status: She is alert and oriented to person, place, and time. Mental status is at baseline.     Cranial Nerves: No cranial nerve deficit, dysarthria or facial asymmetry.  Psychiatric:        Mood and Affect: Mood normal.        Behavior: Behavior normal.        Thought Content: Thought content normal.        Judgment: Judgment normal.    Lab Results  Component Value Date   HGBA1C 6.1 (H) 06/06/2022   HGBA1C 6.0 07/25/2021    Lab Results  Component Value Date   CREATININE 0.66 10/09/2022   CREATININE 0.56 09/26/2022   CREATININE 0.64 09/13/2022    Lab Results  Component Value Date   WBC 7.0 10/09/2022   HGB 13.5 10/09/2022   HCT 39.9 10/09/2022   PLT 195 10/09/2022   GLUCOSE 100 (H) 10/09/2022   CHOL 135 06/05/2022   TRIG 68.0 06/05/2022   HDL 75.20 06/05/2022   LDLDIRECT 41.0 06/05/2022   LDLCALC 46 06/05/2022   ALT 15 10/09/2022   AST 14 (L) 10/09/2022   NA 135 10/09/2022   K 5.1 10/09/2022   CL 101 10/09/2022   CREATININE 0.66 10/09/2022   BUN 20 10/09/2022   CO2 29 10/09/2022   TSH 2.71 07/25/2021   HGBA1C 6.1 (H) 06/06/2022   MICROALBUR <0.7 07/25/2021    No results found.  Assessment & Plan:  .New onset of headache in cancer patient Assessment & Plan: Patient's  headache pattern suggestive of tension headache.  No warning signs. Recommending tylenol, muscle relaxer.   Acute gastritis, presence of bleeding unspecified, unspecified gastritis type -     Metoprolol Succinate ER; Take 1 tablet (25 mg total) by mouth daily.  Dispense: 90 tablet; Refill: 1  Persistent atrial fibrillation (HCC) Assessment & Plan: Currently anticoagulated following cardoversion in September.   In sinus rhythm. ECHO reviewed.    Osteoporosis, unspecified  osteoporosis type, unspecified pathological fracture presence Assessment & Plan: Managed with  Prolia; last dose was in  mid July    Other orders -     Rosuvastatin Calcium; Take 1 tablet (10 mg total) by mouth daily.  Dispense: 90 tablet; Refill: 1 -     Telmisartan; Take 1 tablet (80 mg total) by mouth daily.  Dispense: 90 tablet; Refill: 1 -     tiZANidine HCl; Take 1 tablet (4 mg total) by mouth at bedtime. For headache  Dispense: 30 tablet; Refill: 0     I provided 30 minutes of face-to-face time during this encounter reviewing patient's last visit with me, patient's  most recent visit with cardiology and oncology , recent surgical and non surgical procedures, previous  labs and imaging studies, counseling on currently addressed issues,  and post visit ordering to diagnostics and therapeutics .   Follow-up: No follow-ups on file.   Sherlene Shams, MD

## 2022-11-15 NOTE — Patient Instructions (Addendum)
Your  headache may be due to caffeine withdrawal.    You can add back one caffeinated cup of coffee daily in the morning   It may also be a tension headache,  so I have added a muscle relaxer called tizanidine.  Take this in the evening once you are ready for bed,    You can take between 2000 and 3000 mg of tylenol DAILY in divided doses for your headache (try taking 1150 mg twice daily: 650 plus 500)   If the headaches persist by the tie you return in November  or if they get worse,  we will get an MRI

## 2022-11-17 DIAGNOSIS — R519 Headache, unspecified: Secondary | ICD-10-CM | POA: Insufficient documentation

## 2022-11-17 HISTORY — DX: Headache, unspecified: R51.9

## 2022-11-17 NOTE — Assessment & Plan Note (Addendum)
Patient's headache pattern suggestive of tension headache.  No warning signs. Recommending tylenol, muscle relaxer.

## 2022-11-17 NOTE — Assessment & Plan Note (Addendum)
Managed with  Prolia; last dose was in  mid July

## 2022-11-17 NOTE — Assessment & Plan Note (Addendum)
Currently anticoagulated following cardoversion in September.   In sinus rhythm. ECHO reviewed.

## 2022-12-06 ENCOUNTER — Other Ambulatory Visit: Payer: Self-pay | Admitting: Internal Medicine

## 2022-12-07 ENCOUNTER — Other Ambulatory Visit: Payer: Self-pay | Admitting: Internal Medicine

## 2022-12-11 ENCOUNTER — Encounter: Payer: Self-pay | Admitting: Internal Medicine

## 2022-12-11 ENCOUNTER — Ambulatory Visit: Payer: Medicare HMO | Admitting: Internal Medicine

## 2022-12-11 VITALS — BP 126/82 | HR 79 | Temp 97.9°F | Ht 60.0 in | Wt 161.0 lb

## 2022-12-11 DIAGNOSIS — R519 Headache, unspecified: Secondary | ICD-10-CM | POA: Diagnosis not present

## 2022-12-11 DIAGNOSIS — D6869 Other thrombophilia: Secondary | ICD-10-CM

## 2022-12-11 DIAGNOSIS — I4819 Other persistent atrial fibrillation: Secondary | ICD-10-CM

## 2022-12-11 DIAGNOSIS — I10 Essential (primary) hypertension: Secondary | ICD-10-CM | POA: Diagnosis not present

## 2022-12-11 DIAGNOSIS — E871 Hypo-osmolality and hyponatremia: Secondary | ICD-10-CM

## 2022-12-11 DIAGNOSIS — R7303 Prediabetes: Secondary | ICD-10-CM | POA: Diagnosis not present

## 2022-12-11 DIAGNOSIS — J439 Emphysema, unspecified: Secondary | ICD-10-CM

## 2022-12-11 DIAGNOSIS — E7800 Pure hypercholesterolemia, unspecified: Secondary | ICD-10-CM | POA: Diagnosis not present

## 2022-12-11 NOTE — Patient Instructions (Addendum)
You can stop DAILY use of tizanidine   use it only when you have a headache or muscle spasm   You should be using the Wixela inhaler 2 times daily  EVERY DAY!  NOT ONCE A Day   The albuterol is "as needed"

## 2022-12-11 NOTE — Progress Notes (Unsigned)
Subjective:  Patient ID: Lacey Brown, female    DOB: April 11, 1940  Age: 82 y.o. MRN: [id number]  CC: The primary encounter diagnosis was Primary hypertension. Diagnoses of Pure hypercholesterolemia, Prediabetes, and New onset of headache in cancer patient were also pertinent to this visit.   HPI Lacey Brown presents for  Chief Complaint  Patient presents with   Medical Management of Chronic Issues   1) Headache, tension : started a month ago.  Has arthritis in neck and aggravated by neck stiffness.occurring less frequently.  Resolved with tylenol and tizanidine .  Still thinks it's related to Eliquis or anastrozole.    2) persistent atrial fibrillation:  s.p cardioversion in Sept ,   wants to stop eliquis because she fears it.  Denies history of falls ,  bleeidng.   Patient has a basic mistrust of medications heightened by the constant messages from pharmacists about interactions and wants to know if she can stop any meds  3)elevated potassiun: noted in September,  sodium norma.  Does not take potassium supplement s  Outpatient Medications Prior to Visit  Medication Sig Dispense Refill   acetaminophen (TYLENOL) 650 MG CR tablet Take 2 tablets by mouth 2 (two) times daily.     albuterol (VENTOLIN HFA) 108 (90 Base) MCG/ACT inhaler Inhale 2 puffs into the lungs every 6 (six) hours as needed for wheezing or shortness of breath. 3.7 g 11   amLODipine (NORVASC) 2.5 MG tablet TAKE 1 TABLET BY MOUTH EVERY DAY 90 tablet 2   anastrozole (ARIMIDEX) 1 MG tablet Take 1 tablet (1 mg total) by mouth daily. 90 tablet 3   apixaban (ELIQUIS) 5 MG TABS tablet Take 1 tablet (5 mg total) by mouth 2 (two) times daily. 180 tablet 6   denosumab (PROLIA) 60 MG/ML SOSY injection Inject 60 mg into the skin every 6 (six) months.     metoprolol succinate (TOPROL-XL) 25 MG 24 hr tablet Take 1 tablet (25 mg total) by mouth daily. 90 tablet 1   omeprazole (PRILOSEC OTC) 20 MG tablet Take 1 tablet (20  mg total) by mouth daily. In the morning on an empty stomach (Patient taking differently: Take 20 mg by mouth daily as needed (acid reflux).) 90 tablet 1   rosuvastatin (CRESTOR) 10 MG tablet Take 1 tablet (10 mg total) by mouth daily. 90 tablet 1   telmisartan (MICARDIS) 80 MG tablet Take 1 tablet (80 mg total) by mouth daily. 90 tablet 1   tiZANidine (ZANAFLEX) 4 MG tablet TAKE 1 TABLET (4 MG TOTAL) BY MOUTH AT BEDTIME. FOR HEADACHE 30 tablet 0   trolamine salicylate (ASPERCREME) 10 % cream Apply 1 Application topically as needed for muscle pain.     Vitamin D, Ergocalciferol, (DRISDOL) 1.25 MG (50000 UNIT) CAPS capsule TAKE 1 CAPSULE BY MOUTH ONE TIME PER WEEK (Patient taking differently: Take 50,000 Units by mouth every Sunday.) 12 capsule 0   WIXELA INHUB 250-50 MCG/ACT AEPB INHALE 1 PUFF INTO THE LUNGS IN THE MORNING AND AT BEDTIME. 60 each 1   No facility-administered medications prior to visit.    Review of Systems;  Patient denies headache, fevers, malaise, unintentional weight loss, skin rash, eye pain, sinus congestion and sinus pain, sore throat, dysphagia,  hemoptysis , cough, dyspnea, wheezing, chest pain, palpitations, orthopnea, edema, abdominal pain, nausea, melena, diarrhea, constipation, flank pain, dysuria, hematuria, urinary  Frequency, nocturia, numbness, tingling, seizures,  Focal weakness, Loss of consciousness,  Tremor, insomnia, depression, anxiety, and suicidal ideation.  Objective:  BP 126/82   Pulse 79   Temp 97.9 F (36.6 C)   Ht 5' (1.524 m)   Wt 161 lb (73 kg)   SpO2 99%   BMI 31.44 kg/m   BP Readings from Last 3 Encounters:  12/11/22 126/82  11/15/22 130/60  11/11/22 (!) 160/74    Wt Readings from Last 3 Encounters:  12/11/22 161 lb (73 kg)  11/15/22 160 lb 6.4 oz (72.8 kg)  11/11/22 159 lb 9.6 oz (72.4 kg)    Physical Exam  Lab Results  Component Value Date   HGBA1C 6.1 (H) 06/06/2022   HGBA1C 6.0 07/25/2021    Lab Results   Component Value Date   CREATININE 0.66 10/09/2022   CREATININE 0.56 09/26/2022   CREATININE 0.64 09/13/2022    Lab Results  Component Value Date   WBC 7.0 10/09/2022   HGB 13.5 10/09/2022   HCT 39.9 10/09/2022   PLT 195 10/09/2022   GLUCOSE 100 (H) 10/09/2022   CHOL 135 06/05/2022   TRIG 68.0 06/05/2022   HDL 75.20 06/05/2022   LDLDIRECT 41.0 06/05/2022   LDLCALC 46 06/05/2022   ALT 15 10/09/2022   AST 14 (L) 10/09/2022   NA 135 10/09/2022   K 5.1 10/09/2022   CL 101 10/09/2022   CREATININE 0.66 10/09/2022   BUN 20 10/09/2022   CO2 29 10/09/2022   TSH 2.71 07/25/2021   HGBA1C 6.1 (H) 06/06/2022   MICROALBUR <0.7 07/25/2021    No results found.  Assessment & Plan:  .Primary hypertension -     Microalbumin / creatinine urine ratio -     Comprehensive metabolic panel  Pure hypercholesterolemia -     Lipid panel -     LDL cholesterol, direct  Prediabetes -     Microalbumin / creatinine urine ratio -     Comprehensive metabolic panel -     Hemoglobin A1c  New onset of headache in cancer patient -     Sedimentation rate -     C-reactive protein     I provided 30 minutes of face-to-face time during this encounter reviewing patient's last visit with me, patient's  most recent visit with cardiology,  nephrology,  and neurology,  recent surgical and non surgical procedures, previous  labs and imaging studies, counseling on currently addressed issues,  and post visit ordering to diagnostics and therapeutics .   Follow-up: No follow-ups on file.   Sherlene Shams, MD

## 2022-12-11 NOTE — Assessment & Plan Note (Signed)
Managed with wixela.   She has Only taking it  once daily, and notes occasional wheezing and shortness of breath.  She is reminded to use it two times daily

## 2022-12-11 NOTE — Assessment & Plan Note (Signed)
Currently anticoagulated following cardoversion in September.   In sinus rhythm. ECHO reviewed. Reminded to continue elquis twice daily .

## 2022-12-12 LAB — BASIC METABOLIC PANEL
BUN: 19 mg/dL (ref 6–23)
CO2: 27 meq/L (ref 19–32)
Calcium: 9.5 mg/dL (ref 8.4–10.5)
Chloride: 97 meq/L (ref 96–112)
Creatinine, Ser: 0.63 mg/dL (ref 0.40–1.20)
GFR: 81.86 mL/min (ref >60.00)
Glucose, Bld: 86 mg/dL (ref 70–99)
Potassium: 4.6 meq/L (ref 3.5–5.1)
Sodium: 132 meq/L — ABNORMAL LOW (ref 135–145)

## 2022-12-12 LAB — HEMOGLOBIN A1C: Hgb A1c MFr Bld: 6.4 % (ref 4.6–6.5)

## 2022-12-12 LAB — LDL CHOLESTEROL, DIRECT: Direct LDL: 49 mg/dL

## 2022-12-12 NOTE — Assessment & Plan Note (Addendum)
Chronic and stable for years.  Drinks water and unsweet tea. Salts food to taste  Not taking a diuretic ; likely due to COPD

## 2022-12-12 NOTE — Assessment & Plan Note (Signed)
secondary to atrial fibrillation.  She is  reminded of the increased  risk for stroke .  She is tolerating use of Eliquis for embolic stroke risk mitigation due to  atrial fibrillation. Patient has no signs of bleeding and is advised to notify her specialists prior to any procedure that may required suspension of Eliquis

## 2022-12-12 NOTE — Assessment & Plan Note (Signed)
Improved contro lwith amlodipine 2.5 mg telmisartan 80 mg and  metoprolol 25 mg   daily.  Home readings have been  < 140/90

## 2022-12-16 ENCOUNTER — Telehealth: Payer: Self-pay | Admitting: Internal Medicine

## 2022-12-16 NOTE — Telephone Encounter (Signed)
Patient would also like a copy of her blood tests

## 2022-12-16 NOTE — Telephone Encounter (Signed)
Patient called to see if Shanda Bumps had printed out the list of foods she should eat.

## 2022-12-16 NOTE — Telephone Encounter (Signed)
Spoke with pt to let her know that I printed out the labs and the [facility] lipid diet for her to pick up. Placed up front in the accordion folder.

## 2022-12-17 ENCOUNTER — Ambulatory Visit: Payer: Medicare HMO | Admitting: *Deleted

## 2022-12-17 VITALS — Ht 60.0 in | Wt 158.0 lb

## 2022-12-17 DIAGNOSIS — Z0000 Encounter for general adult medical examination without abnormal findings: Secondary | ICD-10-CM | POA: Diagnosis not present

## 2022-12-17 NOTE — Progress Notes (Signed)
Subjective:   Lacey Brown is a 82 y.o. female who presents for Medicare Annual (Subsequent) preventive examination.  Visit Complete: Virtual I connected with  Kriste Basque on 12/17/22 by a audio enabled telemedicine application and verified that I am speaking with the correct person using two identifiers.  Patient Location: Home  Provider Location: Office/Clinic  I discussed the limitations of evaluation and management by telemedicine. The patient expressed understanding and agreed to proceed.  Vital Signs: Because this visit was a virtual/telehealth visit, some criteria may be missing or patient reported. Any vitals not documented were not able to be obtained and vitals that have been documented are patient reported.    Cardiac Risk Factors include: advanced age (>54men, >6 women);dyslipidemia;hypertension;obesity (BMI >30kg/m2)     Objective:    Today's Vitals   12/17/22 1319  Weight: 158 lb (71.7 kg)  Height: 5' (1.524 m)   Body mass index is 30.86 kg/m.     12/17/2022    1:37 PM 10/09/2022   10:12 AM 09/13/2022    1:19 PM 09/02/2022   12:02 PM 12/10/2021    1:18 PM 12/05/2020    2:17 PM 11/12/2020    3:12 AM  Advanced Directives  Does Patient Have a Medical Advance Directive? Yes No No No No No No  Type of Estate agent of [place];Living will        Copy of Healthcare Power of Attorney in Chart? No - copy requested        Would patient like information on creating a medical advance directive?  No - Patient declined No - Patient declined No - Patient declined No - Patient declined No - Patient declined No - Patient declined    Current Medications (verified) Outpatient Encounter Medications as of 12/17/2022  Medication Sig   acetaminophen (TYLENOL) 650 MG CR tablet Take 2 tablets by mouth 2 (two) times daily.   albuterol (VENTOLIN HFA) 108 (90 Base) MCG/ACT inhaler Inhale 2 puffs into the lungs every 6 (six) hours as needed for  wheezing or shortness of breath.   amLODipine (NORVASC) 2.5 MG tablet TAKE 1 TABLET BY MOUTH EVERY DAY   anastrozole (ARIMIDEX) 1 MG tablet Take 1 tablet (1 mg total) by mouth daily.   apixaban (ELIQUIS) 5 MG TABS tablet Take 1 tablet (5 mg total) by mouth 2 (two) times daily.   denosumab (PROLIA) 60 MG/ML SOSY injection Inject 60 mg into the skin every 6 (six) months.   metoprolol succinate (TOPROL-XL) 25 MG 24 hr tablet Take 1 tablet (25 mg total) by mouth daily.   omeprazole (PRILOSEC OTC) 20 MG tablet Take 1 tablet (20 mg total) by mouth daily. In the morning on an empty stomach (Patient taking differently: Take 20 mg by mouth daily as needed (acid reflux).)   rosuvastatin (CRESTOR) 10 MG tablet Take 1 tablet (10 mg total) by mouth daily.   telmisartan (MICARDIS) 80 MG tablet Take 1 tablet (80 mg total) by mouth daily.   trolamine salicylate (ASPERCREME) 10 % cream Apply 1 Application topically as needed for muscle pain.   Vitamin D, Ergocalciferol, (DRISDOL) 1.25 MG (50000 UNIT) CAPS capsule TAKE 1 CAPSULE BY MOUTH ONE TIME PER WEEK (Patient taking differently: Take 50,000 Units by mouth every Sunday.)   WIXELA INHUB 250-50 MCG/ACT AEPB INHALE 1 PUFF INTO THE LUNGS IN THE MORNING AND AT BEDTIME.   tiZANidine (ZANAFLEX) 4 MG tablet TAKE 1 TABLET (4 MG TOTAL) BY MOUTH AT BEDTIME. FOR HEADACHE (Patient  not taking: Reported on 12/17/2022)   No facility-administered encounter medications on file as of 12/17/2022.    Allergies (verified) Ace inhibitors, Carvedilol, Codeine, and Kenalog [triamcinolone acetonide]   History: Past Medical History:  Diagnosis Date   Arthritis    back, neck; right shoulder;    Asthma    using advair prn   Cancer (HCC) 2024   Left Breast Cancer   Chicken pox    Dyspnea    Dysrhythmia 09/13/2022   Atrial Fibrillation   Hyperlipidemia    Hypertension    controlled with medication;    Peripheral vascular disease (HCC)    mild dilation of aorta   Past  Surgical History:  Procedure Laterality Date   BREAST BIOPSY Left 08/12/2022   [place] LT BREAST BX W LOC DEV 1ST LESION IMG BX SPEC US GUIDE 08/12/2022 [facility]   BREAST BIOPSY Left 08/30/2022   MM LT BREAST BX W LOC DEV EA AD LESION IMG BX SPEC STEREO GUIDE 08/30/2022 [facility]   BREAST BIOPSY Left 08/30/2022   MM LT BREAST BX W LOC DEV 1ST LESION IMAGE BX SPEC STEREO GUIDE 08/30/2022 [facility]   BREAST BIOPSY  09/18/2022   MM LT RADIOACTIVE SEED EA ADD LESION LOC MAMMO GUIDE 09/18/2022 [facility]   BREAST BIOPSY  09/18/2022   MM LT RADIOACTIVE SEED LOC MAMMO GUIDE 09/18/2022 [facility]   BREAST EXCISIONAL BIOPSY Left    BREAST LUMPECTOMY WITH RADIOACTIVE SEED LOCALIZATION Left 09/19/2022   Procedure: LEFT BREAST SEED BRACKETED LUMPECTOMY;  Surgeon: Almond Lint, MD;  Location: [facility] OR;  Service: General;  Laterality: Left;   CARDIOVERSION N/A 10/23/2022   Procedure: CARDIOVERSION;  Surgeon: Wendall Stade, MD;  Location: [facility];  Service: Cardiovascular;  Laterality: N/A;   COLONOSCOPY  2014   FOOT SURGERY Right 1973   cyst removed off right foot   TONSILLECTOMY AND ADENOIDECTOMY  1953   Family History  Problem Relation Age of Onset   Heart disease Mother    Heart disease Father    Arthritis Maternal Grandmother    Diabetes Neg Hx    Cancer Neg Hx    Stroke Neg Hx    Social History   Socioeconomic History   Marital status: Widowed    Spouse name: Not on file   Number of children: Not on file   Years of education: Not on file   Highest education level: Not on file  Occupational History   Not on file  Tobacco Use   Smoking status: Former    Current packs/day: 0.00    Types: Cigarettes    Quit date: 04/17/2009    Years since quitting: 13.6   Smokeless tobacco: Never   Tobacco comments:    Former smoker 09/26/22  Vaping Use   Vaping status: Never Used  Substance and Sexual Activity   Alcohol use: Yes    Alcohol/week: 0.0  standard drinks of alcohol    Comment: occasional   Drug use: No   Sexual activity: Never  Other Topics Concern   Not on file  Social History Narrative   Not on file   Social Determinants of Health   Financial Resource Strain: Low Risk  (12/17/2022)   Overall Financial Resource Strain (CARDIA)    Difficulty of Paying Living Expenses: Not hard at all  Food Insecurity: No Food Insecurity (12/17/2022)   Hunger Vital Sign    Worried About Running Out of Food in the Last Year: Never true  Ran Out of Food in the Last Year: Never true  Transportation Needs: No Transportation Needs (12/17/2022)   PRAPARE - Administrator, Civil Service (Medical): No    Lack of Transportation (Non-Medical): No  Physical Activity: Inactive (12/17/2022)   Exercise Vital Sign    Days of Exercise per Week: 0 days    Minutes of Exercise per Session: 0 min  Stress: No Stress Concern Present (12/17/2022)   [employer] of Occupational Health - Occupational Stress Questionnaire    Feeling of Stress : Not at all  Social Connections: Moderately Integrated (12/17/2022)   Social Connection and Isolation Panel [NHANES]    Frequency of Communication with Friends and Family: More than three times a week    Frequency of Social Gatherings with Friends and Family: More than three times a week    Attends Religious Services: More than 4 times per year    Active Member of [employer] or Organizations: Yes    Attends Banker Meetings: More than 4 times per year    Marital Status: Widowed    Tobacco Counseling Counseling given: Not Answered Tobacco comments: Former smoker 09/26/22   Clinical Intake:  Pre-visit preparation completed: Yes  Pain : No/denies pain     BMI - recorded: 30.86 Nutritional Status: BMI > 30  Obese Nutritional Risks: None Diabetes: No  How often do you need to have someone help you when you read instructions, pamphlets, or other written materials from your  doctor or pharmacy?: 1 - Never  Interpreter Needed?: No  Information entered by :: R. Matthewjames Petrasek LPN   Activities of Daily Living    12/17/2022    1:21 PM 10/23/2022   11:01 AM  In your present state of health, do you have any difficulty performing the following activities:  Hearing? 0 0  Vision? 0 0  Difficulty concentrating or making decisions? 0 0  Walking or climbing stairs? 1 0  Dressing or bathing? 1 0  Comment a little difficulty climbing a lot of steps   Doing errands, shopping? 0   Preparing Food and eating ? N   Using the Toilet? N   In the past six months, have you accidently leaked urine? N   Do you have problems with loss of bowel control? N   Managing your Medications? N   Managing your Finances? N   Housekeeping or managing your Housekeeping? N     Patient Care Team: Sherlene Shams, MD as PCP - General (Internal Medicine) Jake Bathe, MD as PCP - Cardiology (Cardiology) Pershing Proud, RN as Oncology Nurse Navigator Donnelly Angelica, RN as Oncology Nurse Navigator Serena Croissant, MD as Consulting Physician (Hematology and Oncology)  Indicate any recent Medical Services you may have received from other than [facility] providers in the past year (date may be approximate).     Assessment:   This is a routine wellness examination for Gesselle.  Hearing/Vision screen Hearing Screening - Comments:: No issues Vision Screening - Comments:: History of cataract surgery   Goals Addressed             This Visit's Progress    Patient Stated       Wants to find a permanent pain reliever so that she can do more       Depression Screen    12/17/2022    1:31 PM 11/15/2022    2:03 PM 07/18/2022    3:19 PM 06/05/2022    1:28 PM 02/28/2022  11:19 AM 12/24/2021    4:34 PM 12/10/2021    1:05 PM  PHQ 2/9 Scores  PHQ - 2 Score 0 0 0 0 0 0 0  PHQ- 9 Score 0  0  0      Fall Risk    12/17/2022    1:24 PM 11/15/2022    2:02 PM 07/18/2022    3:19 PM 06/05/2022    1:28 PM  02/28/2022   11:18 AM  Fall Risk   Falls in the past year? 0 0 0 0 0  Number falls in past yr: 0 0 0 0 0  Injury with Fall? 0 0 0 0 0  Risk for fall due to : No Fall Risks No Fall Risks No Fall Risks No Fall Risks Impaired mobility  Follow up Falls prevention discussed;Falls evaluation completed Falls evaluation completed Falls evaluation completed Falls evaluation completed Falls evaluation completed    MEDICARE RISK AT HOME: Medicare Risk at Home Any stairs in or around the home?: No If so, are there any without handrails?: No Home free of loose throw rugs in walkways, pet beds, electrical cords, etc?: Yes Adequate lighting in your home to reduce risk of falls?: Yes Life alert?: No Use of a cane, walker or w/c?: No Grab bars in the bathroom?: Yes Shower chair or bench in shower?: Yes Elevated toilet seat or a handicapped toilet?: Yes     Cognitive Function:        12/17/2022    1:39 PM 12/10/2021    1:20 PM 12/05/2020    2:15 PM  6CIT Screen  What Year? 0 points 0 points 0 points  What month? 0 points 0 points 0 points  What time? 0 points 0 points 0 points  Count back from 20 0 points 0 points 0 points  Months in reverse 0 points 0 points 0 points  Repeat phrase 0 points 0 points 0 points  Total Score 0 points 0 points 0 points    Immunizations Immunization History  Administered Date(s) Administered   Influenza, High Dose Seasonal PF 11/06/2014, 11/06/2017, 10/13/2018, 10/07/2019   Influenza,inj,Quad PF,6+ Mos 11/11/2013   Influenza-Unspecified 10/31/2011, 11/18/2012, 11/11/2013, 11/10/2014, 11/30/2015, 10/06/2020, 10/19/2021   PFIZER(Purple Top)SARS-COV-2 Vaccination 02/24/2019, 03/22/2019, 11/16/2019   Pfizer Covid-19 Vaccine Bivalent Booster 50yrs & up 01/04/2021   Pneumococcal Conjugate-13 06/08/2014   Pneumococcal Polysaccharide-23 10/12/2008   Zoster Recombinant(Shingrix) 10/28/2018, 12/28/2018   Zoster, Live 11/29/2009    TDAP status: Due, Education has  been provided regarding the importance of this vaccine. Advised may receive this vaccine at local pharmacy or Health Dept. Aware to provide a copy of the vaccination record if obtained from local pharmacy or Health Dept. Verbalized acceptance and understanding.  Flu Vaccine status: Up to date  Pneumococcal vaccine status: Up to date  Covid-19 vaccine status: Information provided on how to obtain vaccines.   Qualifies for Shingles Vaccine? Yes   Zostavax completed Yes   Shingrix Completed?: Yes  Screening Tests Health Maintenance  Topic Date Due   DTaP/Tdap/Td (1 - Tdap) Never done   Medicare Annual Wellness (AWV)  12/11/2022   COVID-19 Vaccine (5 - 2023-24 season) 12/27/2022 (Originally 10/06/2022)   INFLUENZA VACCINE  05/05/2023 (Originally 09/05/2022)   Pneumonia Vaccine 78+ Years old  Completed   DEXA SCAN  Completed   Zoster Vaccines- Shingrix  Completed   HPV VACCINES  Aged Out    Health Maintenance  Health Maintenance Due  Topic Date Due   DTaP/Tdap/Td (1 - Tdap) Never  done   Medicare Annual Wellness (AWV)  12/11/2022    Colorectal cancer screening: No longer required.   Mammogram status: Completed 07/2022. Repeat every year  Bone Density status: Completed 07/2022. Results reflect: Bone density results: OSTEOPOROSIS. Repeat every 2 years.  Lung Cancer Screening: (Low Dose CT Chest recommended if Age 66-80 years, 20 pack-year currently smoking OR have quit w/in 15years.) does not qualify.     Additional Screening:  Hepatitis C Screening: does not qualify; Completed NA age  Vision Screening: Recommended annual ophthalmology exams for early detection of glaucoma and other disorders of the eye. Is the patient up to date with their annual eye exam?  Yes  Who is the provider or what is the name of the office in which the patient attends annual eye exams? [facility] If pt is not established with a provider, would they like to be referred to a provider to establish care? No .    Dental Screening: Recommended annual dental exams for proper oral hygiene  Community Resource Referral / Chronic Care Management: CRR required this visit?  No   CCM required this visit?  No     Plan:     I have personally reviewed and noted the following in the patient's chart:   Medical and social history Use of alcohol, tobacco or illicit drugs  Current medications and supplements including opioid prescriptions. Patient is not currently taking opioid prescriptions. Functional ability and status Nutritional status Physical activity Advanced directives List of other physicians Hospitalizations, surgeries, and ER visits in previous 12 months Vitals Screenings to include cognitive, depression, and falls Referrals and appointments  In addition, I have reviewed and discussed with patient certain preventive protocols, quality metrics, and best practice recommendations. A written personalized care plan for preventive services as well as general preventive health recommendations were provided to patient.     Sydell Axon, LPN   [id number]   After Visit Summary: (Pick Up) Due to this being a telephonic visit, with patients personalized plan was offered to patient and patient has requested to Pick up at office.  Nurse Notes: None

## 2022-12-17 NOTE — Patient Instructions (Signed)
Lacey Brown , Thank you for taking time to come for your Medicare Wellness Visit. I appreciate your ongoing commitment to your health goals. Please review the following plan we discussed and let me know if I can assist you in the future.   Referrals/Orders/Follow-Ups/Clinician Recommendations: Remember to update your Tetanus vaccine  This is a list of the screening recommended for you and due dates:  Health Maintenance  Topic Date Due   DTaP/Tdap/Td vaccine (1 - Tdap) Never done   COVID-19 Vaccine (5 - 2023-24 season) 12/27/2022*   Flu Shot  05/05/2023*   Mammogram  08/02/2023   Medicare Annual Wellness Visit  12/17/2023   Pneumonia Vaccine  Completed   DEXA scan (bone density measurement)  Completed   Zoster (Shingles) Vaccine  Completed   HPV Vaccine  Aged Out  *Topic was postponed. The date shown is not the original due date.    Advanced directives: (Declined) Advance directive discussed with you today. Even though you declined this today, please call our office should you change your mind, and we can give you the proper paperwork for you to fill out.  Next Medicare Annual Wellness Visit scheduled for next year: Yes11/17/25 @ 12:40

## 2022-12-18 ENCOUNTER — Ambulatory Visit (HOSPITAL_COMMUNITY): Payer: Medicare HMO | Attending: Cardiology

## 2022-12-18 DIAGNOSIS — I700 Atherosclerosis of aorta: Secondary | ICD-10-CM | POA: Diagnosis not present

## 2022-12-18 DIAGNOSIS — I77810 Thoracic aortic ectasia: Secondary | ICD-10-CM | POA: Diagnosis not present

## 2022-12-18 DIAGNOSIS — I361 Nonrheumatic tricuspid (valve) insufficiency: Secondary | ICD-10-CM

## 2022-12-18 LAB — ECHOCARDIOGRAM COMPLETE
Area-P 1/2: 4.26 cm2
S' Lateral: 2.5 cm

## 2022-12-23 ENCOUNTER — Telehealth: Payer: Self-pay | Admitting: Internal Medicine

## 2022-12-23 NOTE — Telephone Encounter (Signed)
Patient had a AWV last week. Patient forgot what vaccines she needed to get and where to go get them. She also wants to know if Dr Darrick Huntsman wants her to get the RSV injection.

## 2022-12-24 NOTE — Telephone Encounter (Signed)
Pt is questioning whether or not she needs another pneumonia vaccine. Health maintenance is not flagging her for another one but in her immunization record it only shows that she has had two; one prevnar 13 in 2016 and one pneumovax 23 in 2010.

## 2022-12-24 NOTE — Telephone Encounter (Signed)
Patient just returned phone call. I read her the message. And she would like for you to call her back. Her number is [phone].

## 2022-12-24 NOTE — Telephone Encounter (Signed)
LMTCB

## 2022-12-26 NOTE — Telephone Encounter (Signed)
Pt is aware and and gave a verbal understanding.

## 2023-01-02 ENCOUNTER — Other Ambulatory Visit: Payer: Self-pay | Admitting: Internal Medicine

## 2023-01-07 ENCOUNTER — Encounter: Payer: Self-pay | Admitting: *Deleted

## 2023-01-08 ENCOUNTER — Encounter: Payer: Self-pay | Admitting: Adult Health

## 2023-01-08 ENCOUNTER — Inpatient Hospital Stay: Payer: Medicare HMO | Attending: Hematology and Oncology | Admitting: Adult Health

## 2023-01-08 VITALS — BP 156/63 | HR 78 | Temp 97.9°F | Resp 18 | Wt 160.4 lb

## 2023-01-08 DIAGNOSIS — Z79811 Long term (current) use of aromatase inhibitors: Secondary | ICD-10-CM | POA: Insufficient documentation

## 2023-01-08 DIAGNOSIS — C50212 Malignant neoplasm of upper-inner quadrant of left female breast: Secondary | ICD-10-CM | POA: Insufficient documentation

## 2023-01-08 DIAGNOSIS — Z87891 Personal history of nicotine dependence: Secondary | ICD-10-CM | POA: Diagnosis not present

## 2023-01-08 DIAGNOSIS — Z923 Personal history of irradiation: Secondary | ICD-10-CM | POA: Diagnosis not present

## 2023-01-08 DIAGNOSIS — Z79899 Other long term (current) drug therapy: Secondary | ICD-10-CM | POA: Diagnosis not present

## 2023-01-08 DIAGNOSIS — Z170 Estrogen receptor positive status [ER+]: Secondary | ICD-10-CM | POA: Insufficient documentation

## 2023-01-08 NOTE — Progress Notes (Signed)
SURVIVORSHIP VISIT:  BRIEF ONCOLOGIC HISTORY:  Oncology History  Malignant neoplasm of upper-inner quadrant of left breast in female, estrogen receptor positive (HCC)  08/30/2022 Initial Diagnosis   Screening mammogram detected left breast mass 6 mm by ultrasound.  Biopsy UIQ: ADH involving small intraductal papilloma/radial sclerosing lesion.  Mass upper inner quadrant: Grade 2 IDC with mucinous features with DCIS low-grade, ER 100%, PR 100%, Ki-67 2%, HER2 1+ negative   09/03/2022 Cancer Staging   Staging form: Breast, AJCC 8th Edition - Clinical stage from 09/03/2022: Stage IA (cT1b, cN0, cM0, G2, ER+, PR+, HER2-) - Signed by Serena Croissant, MD on 09/11/2022 Stage prefix: Initial diagnosis Method of lymph node assessment: Clinical Histologic grading system: 3 grade system   09/19/2022 Surgery   Left lumpectomy: Grade 2 IDC with extracellular mucin 0.7 cm with DCIS intermediate grade, margins negative, for vascular invasion not identified, lateral margin: DCIS involving intraductal papilloma, all margins negative, ER 100%, PR 100%, HER2 1+, Ki-67 2%    09/19/2022 Cancer Staging   Staging form: Breast, AJCC 8th Edition - Pathologic stage from 09/19/2022: Stage IA (pT1b, pN0, cM0, G2, ER+, PR+, HER2-) - Signed by Loa Socks, NP on 01/07/2023 Stage prefix: Initial diagnosis Histologic grading system: 3 grade system   10/2022 -  Anti-estrogen oral therapy   1 mg Anastrozole x 5 years     INTERVAL HISTORY:  Lacey Brown to review her survivorship care plan detailing her treatment course for breast cancer, as well as monitoring long-term side effects of that treatment, education regarding health maintenance, screening, and overall wellness and health promotion.     Overall, Lacey Brown reports experiencing occasional soreness, which she attributes to arthritis. However, she also expresses concern that her current medication, Anastrozole, might be contributing to her discomfort.  The patient started taking Anastrozole in late August or early September and has noticed an increase in joint aches and pains since then. She also reports occasional mild night sweats but no other significant side effects from the medication.  The patient has been managing her discomfort and continues to maintain an active lifestyle, moving around frequently throughout the day. She has not noticed any significant limitations in her activity level due to the joint aches and pains.  In addition to the breast cancer, the patient has a history of back pain and knee issues. She has been receiving Prolia injections every six months for bone density management. The patient also had a bone density test and mammogram this year. She has been diligent about her annual mammograms, which led to the early detection of her breast cancer.  REVIEW OF SYSTEMS:  Review of Systems  Constitutional:  Negative for appetite change, chills, fatigue, fever and unexpected weight change.  HENT:   Negative for hearing loss, lump/mass and trouble swallowing.   Eyes:  Negative for eye problems and icterus.  Respiratory:  Negative for chest tightness, cough and shortness of breath.   Cardiovascular:  Negative for chest pain, leg swelling and palpitations.  Gastrointestinal:  Negative for abdominal distention, abdominal pain, constipation, diarrhea, nausea and vomiting.  Endocrine: Negative for hot flashes.  Genitourinary:  Negative for difficulty urinating.   Musculoskeletal:  Negative for arthralgias.  Skin:  Negative for itching and rash.  Neurological:  Negative for dizziness, extremity weakness, headaches and numbness.  Hematological:  Negative for adenopathy. Does not bruise/bleed easily.  Psychiatric/Behavioral:  Negative for depression. The patient is not nervous/anxious.   Breast: Denies any new nodularity, masses, tenderness, nipple changes, or  nipple discharge.       PAST MEDICAL/SURGICAL HISTORY:  Past Medical  History:  Diagnosis Date   Arthritis    back, neck; right shoulder;    Asthma    using advair prn   Cancer (HCC) 2024   Left Breast Cancer   Chicken pox    Dyspnea    Dysrhythmia 09/13/2022   Atrial Fibrillation   Hyperlipidemia    Hypertension    controlled with medication;    Peripheral vascular disease (HCC)    mild dilation of aorta   Past Surgical History:  Procedure Laterality Date   BREAST BIOPSY Left 08/12/2022   [place] LT BREAST BX W LOC DEV 1ST LESION IMG BX SPEC US GUIDE 08/12/2022 [facility]   BREAST BIOPSY Left 08/30/2022   MM LT BREAST BX W LOC DEV EA AD LESION IMG BX SPEC STEREO GUIDE 08/30/2022 [facility]   BREAST BIOPSY Left 08/30/2022   MM LT BREAST BX W LOC DEV 1ST LESION IMAGE BX SPEC STEREO GUIDE 08/30/2022 [facility]   BREAST BIOPSY  09/18/2022   MM LT RADIOACTIVE SEED EA ADD LESION LOC MAMMO GUIDE 09/18/2022 [facility]   BREAST BIOPSY  09/18/2022   MM LT RADIOACTIVE SEED LOC MAMMO GUIDE 09/18/2022 [facility]   BREAST EXCISIONAL BIOPSY Left    BREAST LUMPECTOMY WITH RADIOACTIVE SEED LOCALIZATION Left 09/19/2022   Procedure: LEFT BREAST SEED BRACKETED LUMPECTOMY;  Surgeon: Almond Lint, MD;  Location: [facility] OR;  Service: General;  Laterality: Left;   CARDIOVERSION N/A 10/23/2022   Procedure: CARDIOVERSION;  Surgeon: Wendall Stade, MD;  Location: [facility];  Service: Cardiovascular;  Laterality: N/A;   COLONOSCOPY  2014   FOOT SURGERY Right 1973   cyst removed off right foot   TONSILLECTOMY AND ADENOIDECTOMY  1953     ALLERGIES:  Allergies  Allergen Reactions   Ace Inhibitors Cough   Carvedilol     Dizziness    Codeine Nausea Only   Kenalog [Triamcinolone Acetonide] Other (See Comments)    11/2020 post-injection pain after knee injection.  Kenalog preservatives most likely. She should have no issue with oral steroids, or other injectables.  (Ie. Depo-Medrol, Solu-Medrol, Decadron, Celestone, etc.)      CURRENT MEDICATIONS:  Outpatient Encounter Medications as of 01/08/2023  Medication Sig   acetaminophen (TYLENOL) 650 MG CR tablet Take 2 tablets by mouth 2 (two) times daily.   albuterol (VENTOLIN HFA) 108 (90 Base) MCG/ACT inhaler Inhale 2 puffs into the lungs every 6 (six) hours as needed for wheezing or shortness of breath.   amLODipine (NORVASC) 2.5 MG tablet TAKE 1 TABLET BY MOUTH EVERY DAY   anastrozole (ARIMIDEX) 1 MG tablet Take 1 tablet (1 mg total) by mouth daily.   apixaban (ELIQUIS) 5 MG TABS tablet Take 1 tablet (5 mg total) by mouth 2 (two) times daily.   denosumab (PROLIA) 60 MG/ML SOSY injection Inject 60 mg into the skin every 6 (six) months.   metoprolol succinate (TOPROL-XL) 25 MG 24 hr tablet Take 1 tablet (25 mg total) by mouth daily.   omeprazole (PRILOSEC OTC) 20 MG tablet Take 1 tablet (20 mg total) by mouth daily. In the morning on an empty stomach (Patient taking differently: Take 20 mg by mouth daily as needed (acid reflux).)   rosuvastatin (CRESTOR) 10 MG tablet Take 1 tablet (10 mg total) by mouth daily.   telmisartan (MICARDIS) 80 MG tablet Take 1 tablet (80 mg total) by mouth daily.   trolamine salicylate (  ASPERCREME) 10 % cream Apply 1 Application topically as needed for muscle pain.   WIXELA INHUB 250-50 MCG/ACT AEPB INHALE 1 PUFF INTO THE LUNGS IN THE MORNING AND AT BEDTIME.   tiZANidine (ZANAFLEX) 4 MG tablet TAKE 1 TABLET BY MOUTH AT BEDTIME FOR HEADACHE (Patient not taking: Reported on 01/08/2023)   Vitamin D, Ergocalciferol, (DRISDOL) 1.25 MG (50000 UNIT) CAPS capsule TAKE 1 CAPSULE BY MOUTH ONE TIME PER WEEK (Patient not taking: Reported on 01/08/2023)   No facility-administered encounter medications on file as of 01/08/2023.     ONCOLOGIC FAMILY HISTORY:  Family History  Problem Relation Age of Onset   Heart disease Mother    Heart disease Father    Arthritis Maternal Grandmother    Diabetes Neg Hx    Cancer Neg Hx    Stroke Neg Hx       SOCIAL HISTORY:  Social History   Socioeconomic History   Marital status: Widowed    Spouse name: Not on file   Number of children: Not on file   Years of education: Not on file   Highest education level: Not on file  Occupational History   Not on file  Tobacco Use   Smoking status: Former    Current packs/day: 0.00    Types: Cigarettes    Quit date: 04/17/2009    Years since quitting: 13.7   Smokeless tobacco: Never   Tobacco comments:    Former smoker 09/26/22  Vaping Use   Vaping status: Never Used  Substance and Sexual Activity   Alcohol use: Yes    Alcohol/week: 0.0 standard drinks of alcohol    Comment: occasional   Drug use: No   Sexual activity: Never  Other Topics Concern   Not on file  Social History Narrative   Not on file   Social Determinants of Health   Financial Resource Strain: Low Risk  (12/17/2022)   Overall Financial Resource Strain (CARDIA)    Difficulty of Paying Living Expenses: Not hard at all  Food Insecurity: No Food Insecurity (12/17/2022)   Hunger Vital Sign    Worried About Running Out of Food in the Last Year: Never true    Ran Out of Food in the Last Year: Never true  Transportation Needs: No Transportation Needs (12/17/2022)   PRAPARE - Administrator, Civil Service (Medical): No    Lack of Transportation (Non-Medical): No  Physical Activity: Inactive (12/17/2022)   Exercise Vital Sign    Days of Exercise per Week: 0 days    Minutes of Exercise per Session: 0 min  Stress: No Stress Concern Present (12/17/2022)   [employer] of Occupational Health - Occupational Stress Questionnaire    Feeling of Stress : Not at all  Social Connections: Moderately Integrated (12/17/2022)   Social Connection and Isolation Panel [NHANES]    Frequency of Communication with Friends and Family: More than three times a week    Frequency of Social Gatherings with Friends and Family: More than three times a week    Attends  Religious Services: More than 4 times per year    Active Member of [employer] or Organizations: Yes    Attends Banker Meetings: More than 4 times per year    Marital Status: Widowed  Intimate Partner Violence: Not At Risk (12/17/2022)   Humiliation, Afraid, Rape, and Kick questionnaire    Fear of Current or Ex-Partner: No    Emotionally Abused: No    Physically Abused: No  Sexually Abused: No     OBSERVATIONS/OBJECTIVE:  BP (!) 156/63 (BP Location: Left Arm, Patient Position: Sitting)   Pulse 78   Temp 97.9 F (36.6 C) (Temporal)   Resp 18   Wt 160 lb 6.4 oz (72.8 kg)   SpO2 100%   BMI 31.33 kg/m  GENERAL: Patient is a well appearing female in no acute distress HEENT:  Sclerae anicteric.  Oropharynx clear and moist. No ulcerations or evidence of oropharyngeal candidiasis. Neck is supple.  NODES:  No cervical, supraclavicular, or axillary lymphadenopathy palpated.  BREAST EXAM:  left breast s/p lumpectomy and radiation, no sign of local recurrence, right breast benign LUNGS:  Clear to auscultation bilaterally.  No wheezes or rhonchi. HEART:  Regular rate and rhythm. No murmur appreciated. ABDOMEN:  Soft, nontender.  Positive, normoactive bowel sounds. No organomegaly palpated. MSK:  No focal spinal tenderness to palpation. Full range of motion bilaterally in the upper extremities. EXTREMITIES:  No peripheral edema.   SKIN:  Clear with no obvious rashes or skin changes. No nail dyscrasia. NEURO:  Nonfocal. Well oriented.  Appropriate affect.   LABORATORY DATA:  None for this visit.  DIAGNOSTIC IMAGING:  None for this visit.      ASSESSMENT AND PLAN:  Ms.. Brown is a pleasant 82 y.o. female with Stage IA left breast invasive ductal carcinoma, ER+/PR+/HER2-, diagnosed in 08/2022, treated with lumpectomy, and anti-estrogen therapy with Anastrozole beginning in 10/2022.  She presents to the Survivorship Clinic for our initial meeting and routine follow-up  post-completion of treatment for breast cancer.    1. Stage IA left breast cancer:  Lacey Brown is continuing to recover from definitive treatment for breast cancer. She will follow-up with her medical oncologist, Dr.  Pamelia Hoit with history and physical exam per surveillance protocol.  She will continue her anti-estrogen therapy with Anastrozole. Thus far, she is tolerating the Anastrozole well, with minimal side effects. Her mammogram is due 08/2022; orders placed today.   Today, a comprehensive survivorship care plan and treatment summary was reviewed with the patient today detailing her breast cancer diagnosis, treatment course, potential late/long-term effects of treatment, appropriate follow-up care with recommendations for the future, and patient education resources.  A copy of this summary, along with a letter will be sent to the patient's primary care provider via mail/fax/In Basket message after today's visit.    2. Bone health:  Given Lacey Brown's age/history of breast cancer and her current treatment regimen including anti-estrogen therapy with Anastrozole, she is at risk for bone demineralization.  Her last DEXA scan was 07/2022 and was consistent with osteoporosis in the left forearm.  She receives Prolia every 6 months with her PCP, Dr. Darrick Huntsman.  She is recommended to repeat DEXA testing every 2 years while taking Anastrozole.  She was given education on specific activities to promote bone health.  3. Cancer screening:  Due to Lacey Brown's history and her age, she should receive screening for skin cancers, colon cancer, and gynecologic cancers.  The information and recommendations are listed on the patient's comprehensive care plan/treatment summary and were reviewed in detail with the patient.    4. Health maintenance and wellness promotion: Lacey Brown was encouraged to consume 5-7 servings of fruits and vegetables per day. We reviewed the "Nutrition Rainbow" handout.  She was also  encouraged to engage in moderate to vigorous exercise for 30 minutes per day most days of the week.  She was instructed to limit her alcohol consumption and continue to abstain  from tobacco use.     5. Support services/counseling: It is not uncommon for this period of the patient's cancer care trajectory to be one of many emotions and stressors.   She was given information regarding our available services and encouraged to contact me with any questions or for help enrolling in any of our support group/programs.    Follow up instructions:    -Return to cancer center in 6 months for f/u with Dr. Pamelia Hoit  -Mammogram due in 08/2022 -She is welcome to return back to the Survivorship Clinic at any time; no additional follow-up needed at this time.  -Consider referral back to survivorship as a long-term survivor for continued surveillance  The patient was provided an opportunity to ask questions and all were answered. The patient agreed with the plan and demonstrated an understanding of the instructions.   Total encounter time:30 minutes*in face-to-face visit time, chart review, lab review, care coordination, order entry, and documentation of the encounter time.    Lillard Anes, NP 01/08/23 3:12 PM Medical Oncology and Hematology [facility] [address] [place], [place] [address] Tel. [phone]    Fax. [phone]  *Total Encounter Time as defined by the Centers for Medicare and Medicaid Services includes, in addition to the face-to-face time of a patient visit (documented in the note above) non-face-to-face time: obtaining and reviewing outside history, ordering and reviewing medications, tests or procedures, care coordination (communications with other health care professionals or caregivers) and documentation in the medical record.

## 2023-01-09 ENCOUNTER — Other Ambulatory Visit: Payer: Self-pay | Admitting: Internal Medicine

## 2023-01-20 DIAGNOSIS — M47816 Spondylosis without myelopathy or radiculopathy, lumbar region: Secondary | ICD-10-CM | POA: Diagnosis not present

## 2023-01-21 ENCOUNTER — Telehealth: Payer: Self-pay

## 2023-01-21 NOTE — Telephone Encounter (Signed)
Patient states she is checking with [place] regarding her next Prolia injection.  Patient states she hasn't heard from [place] and does not want to miss her injection.  I spoke with Thurmond Butts, CMA, and she states she will contact her in January.  Latoya asked that I ask patient if she will have the same insurance next year.  Patient states she will have a new insurance company in 2025.  Patient states she will be changing to [facility] [employer].  Patient states her ID number will be [id number].  Patient states her health plan is [phone].  I let patient know that I will send this information to Thurmond Butts, CMA, and she will be in touch with her to schedule her appointment for her next Prolia injection in January, 2025.

## 2023-01-23 MED ORDER — DENOSUMAB 60 MG/ML ~~LOC~~ SOSY
60.0000 mg | PREFILLED_SYRINGE | Freq: Once | SUBCUTANEOUS | Status: AC
  Administered 2023-02-21: 60 mg via SUBCUTANEOUS

## 2023-01-23 NOTE — Addendum Note (Signed)
Addended by: Warden Fillers on: 01/23/2023 09:51 AM   Modules accepted: Orders

## 2023-01-23 NOTE — Telephone Encounter (Signed)
Pt due on or after 02/19/23 new to my practice insurance will be in effect. Info in message below.  Forwarding to PA team for benefit verification & PA test claim

## 2023-02-06 ENCOUNTER — Telehealth: Payer: Self-pay

## 2023-02-06 NOTE — Telephone Encounter (Signed)
 Copied from CRM [id number]. Topic: General - Call Back - No Documentation >> Feb 06, 2023 10:44 AM Joanell NOVAK wrote: Reason for CRM: Pt stated that she would like to request a callback regarding the polio shot scheduling.  I left voicemail for patient asking her to please call us .  When patient calls back, please let her know that we are waiting for prior authorization from her insurance company before we schedule an appointment for her next Prolia  injection.  Please let her know that we will call her once we have the authorization.

## 2023-02-06 NOTE — Telephone Encounter (Signed)
 Error

## 2023-02-07 ENCOUNTER — Telehealth: Payer: Self-pay

## 2023-02-07 NOTE — Telephone Encounter (Signed)
 Prolia VOB initiated via [email or web address]  Next Prolia inj DUE: 02/17/23

## 2023-02-11 ENCOUNTER — Other Ambulatory Visit: Payer: Self-pay | Admitting: Internal Medicine

## 2023-02-11 DIAGNOSIS — K2900 Acute gastritis without bleeding: Secondary | ICD-10-CM

## 2023-02-12 ENCOUNTER — Encounter: Payer: Self-pay | Admitting: Orthopaedic Surgery

## 2023-02-12 ENCOUNTER — Ambulatory Visit: Payer: HMO | Admitting: Orthopaedic Surgery

## 2023-02-12 DIAGNOSIS — M25562 Pain in left knee: Secondary | ICD-10-CM | POA: Diagnosis not present

## 2023-02-12 DIAGNOSIS — G8929 Other chronic pain: Secondary | ICD-10-CM

## 2023-02-12 DIAGNOSIS — M25512 Pain in left shoulder: Secondary | ICD-10-CM

## 2023-02-12 MED ORDER — METHYLPREDNISOLONE ACETATE 40 MG/ML IJ SUSP
40.0000 mg | INTRAMUSCULAR | Status: AC | PRN
  Administered 2023-02-12: 40 mg via INTRA_ARTICULAR

## 2023-02-12 MED ORDER — LIDOCAINE HCL 1 % IJ SOLN
3.0000 mL | INTRAMUSCULAR | Status: AC | PRN
  Administered 2023-02-12: 3 mL

## 2023-02-12 NOTE — Progress Notes (Signed)
 The patient is well-known to our service.  She is 83 years old and does have chronic left knee pain and left shoulder pain.  We last saw her just over 3 months ago and she had a steroid injection in both areas.  She is not a poorly controlled diabetic and is on blood thinning medications that she cannot take anti-inflammatories.  She has had no acute change in her medical status and comes in today with continued chronic left knee pain and chronic left shoulder pain and is requesting an injection in both areas today of a steroid.  I agree with this as well.  Examination of her left shoulder shows no gross deficits the left shoulder but pain throughout the arc of motion and signs of impingement.  Examination of her left knee she has patellofemoral capitation and obvious arthritis with good range of motion and a stable knee.  I agree with continuing conservative treatment with steroid injections and she always waits at least 3 months.  She has always been counseled on the risks and benefits of steroid injections and how this may affect her blood glucose levels which she will continue to watch closely  She did tolerate the steroid injection in both her left shoulder and left knee today.  She knows to wait at least 3 months between steroid injections.  She would like to go ahead and be seen in 3 months from now which I think is reasonable.    Procedure Note  Patient: Lacey Brown             Date of Birth: 12/22/1940           MRN: [id number]             Visit Date: 02/12/2023  Procedures: Visit Diagnoses:  1. Chronic left shoulder pain   2. Chronic pain of left knee     Large Joint Inj: L knee on 02/12/2023 12:57 PM Indications: diagnostic evaluation and pain Details: 22 G 1.5 in needle, superolateral approach  Arthrogram: No  Medications: 3 mL lidocaine  1 %; 40 mg methylPREDNISolone  acetate 40 MG/ML Outcome: tolerated well, no immediate complications Procedure, treatment alternatives,  risks and benefits explained, specific risks discussed. Consent was given by the patient. Immediately prior to procedure a time out was called to verify the correct patient, procedure, equipment, support staff and site/side marked as required. Patient was prepped and draped in the usual sterile fashion.    Large Joint Inj: L subacromial bursa on 02/12/2023 12:58 PM Indications: pain and diagnostic evaluation Details: 22 G 1.5 in needle  Arthrogram: No  Medications: 3 mL lidocaine  1 %; 40 mg methylPREDNISolone  acetate 40 MG/ML Outcome: tolerated well, no immediate complications Procedure, treatment alternatives, risks and benefits explained, specific risks discussed. Consent was given by the patient. Immediately prior to procedure a time out was called to verify the correct patient, procedure, equipment, support staff and site/side marked as required. Patient was prepped and draped in the usual sterile fashion.

## 2023-02-14 ENCOUNTER — Other Ambulatory Visit (HOSPITAL_COMMUNITY): Payer: Self-pay

## 2023-02-14 NOTE — Telephone Encounter (Signed)
 Pt ready for scheduling for PROLIA  on or after : 02/17/23  Out-of-pocket cost due at time of visit: $323  Number of injection/visits approved: ---  Primary: [employer] Prolia  co-insurance: 20% Admin fee co-insurance: 0%  Secondary: --- Prolia  co-insurance:  Admin fee co-insurance:   Medical Benefit Details: Date Benefits were checked: 02/13/23 Deductible: NO/ Coinsurance: 20%/ Admin Fee: 0%  Prior Auth: N/A PA# Expiration Date:   # of doses approved:  Pharmacy benefit: Copay $250 If patient wants fill through the pharmacy benefit please send prescription to: [employer] [facility], and include estimated need by date in rx notes. Pharmacy will ship medication directly to the office.  Patient NOT eligible for Prolia  Copay Card. Copay Card can make patient's cost as little as $25. Link to apply: [email or web address]://[email or web address]  ** This summary of benefits is an estimation of the patient's out-of-pocket cost. Exact cost may very based on individual plan coverage.

## 2023-02-17 ENCOUNTER — Ambulatory Visit: Payer: Medicare HMO | Admitting: Cardiology

## 2023-02-18 ENCOUNTER — Encounter: Payer: Self-pay | Admitting: Cardiology

## 2023-02-18 ENCOUNTER — Ambulatory Visit: Payer: HMO | Attending: Cardiology | Admitting: Cardiology

## 2023-02-18 VITALS — BP 134/80 | HR 69 | Ht 60.0 in | Wt 158.8 lb

## 2023-02-18 DIAGNOSIS — I77810 Thoracic aortic ectasia: Secondary | ICD-10-CM | POA: Diagnosis not present

## 2023-02-18 DIAGNOSIS — I4819 Other persistent atrial fibrillation: Secondary | ICD-10-CM

## 2023-02-18 NOTE — Progress Notes (Signed)
 Cardiology Office Note:  .   Date:  02/18/2023  ID:  Lacey Brown, DOB [birth year], MRN [id number] PCP: [name], MD  [facility] Providers Cardiologist:  Oneil Parchment, MD    History of Present Illness: .   Lacey Brown is a 83 y.o. female Discussed the use of AI scribe software for clinical note transcription with the patient, who gave verbal consent to proceed.  History of Present Illness   The patient, an 83 year old with a history of paroxysmal atrial fibrillation, hypertension, hyperlipidemia, and chronic dyspnea on exertion, was last seen in the atrial fibrillation clinic on 11/11/2022. She was noted to be in new onset rate-controlled atrial fibrillation during a preoperative testing appointment. The patient underwent successful cardioversion on 10/23/2022 and has been maintaining sinus rhythm since. She was started on Eliquis  after surgery on 09/19/2022 and has no cardiac awareness of atrial fibrillation.  The patient underwent a minor breast surgery, which she reports was successful and without complications. However, the stress of the surgery led to the onset of atrial fibrillation. She has been taking Eliquis  since, but expresses concerns about the cost and the necessity of the medication. She reports no issues with her other medications, which include rosuvastatin  for cholesterol and telmisartan  for blood pressure.  The patient has not experienced any episodes of atrial fibrillation since the cardioversion and is interested in discontinuing Eliquis . She has not had any strokes in her family history and wishes to avoid being the first. She is compliant with her medication regimen but expresses a dislike for taking pills. She has had all her COVID-19 vaccinations and has not contracted the virus.          ROS: No CP, no SOB  Studies Reviewed: .        Results   LABS LDL: 46  RADIOLOGY Ascending aorta measurement: 41 mm - 43 mm    Risk  Assessment/Calculations:    CHA2DS2-VASc Score = 4  This indicates a 4.8% annual risk of stroke. [name] score is based upon: CHF History: 0 HTN History: 1 Diabetes History: 0 Stroke History: 0 Vascular Disease History: 0 Age Score: 2 Gender Score: 1            Physical Exam:   VS:  BP 134/80   Pulse 69   Ht 5' (1.524 m)   Wt 158 lb 12.8 oz (72 kg)   SpO2 99%   BMI 31.01 kg/m    Wt Readings from Last 3 Encounters:  02/18/23 158 lb 12.8 oz (72 kg)  01/08/23 160 lb 6.4 oz (72.8 kg)  12/17/22 158 lb (71.7 kg)    GEN: Well nourished, well developed in no acute distress NECK: No JVD; No carotid bruits CARDIAC: RRR, no murmurs, no rubs, no gallops RESPIRATORY:  Clear to auscultation without rales, wheezing or rhonchi  ABDOMEN: Soft, non-tender, non-distended EXTREMITIES:  No edema; No deformity   ASSESSMENT AND PLAN: .    Assessment and Plan    Paroxysmal Atrial Fibrillation [age]-year-old with paroxysmal atrial fibrillation, currently in sinus rhythm post successful cardioversion on 10/23/2022. No cardiac awareness of atrial fibrillation. Discussed discontinuing Eliquis  due to cost concerns. Explained the risk of stroke if atrial fibrillation recurs and the benefits of continued anticoagulation with Eliquis , which significantly reduces stroke risk. Consideration of a heart monitor to evaluate for recurrence if asymptomatic for a year. Discussed the Watchman device as an alternative for patients with issues related to Eliquis , including cost and bleeding risks. -  Continue Eliquis  - Consider heart monitor if asymptomatic for a year - Discuss Watchman device if issues with Eliquis  arise  Hypertension Well-controlled on telmisartan  80 mg. - Continue telmisartan  80 mg  Hyperlipidemia Well-controlled on rosuvastatin  10 mg. LDL at 46. - Continue rosuvastatin  10 mg  Chronic Dyspnea on Exertion Chronic condition, no new symptoms reported.  General Health  Maintenance Received all COVID-19 vaccinations and has not contracted COVID-19. - Continue routine vaccinations  Follow-up - Schedule follow-up in one year - Reach out if symptoms change.     Check echo in one year - ascending aorta 41-82mm         Signed, Oneil Parchment, MD

## 2023-02-18 NOTE — Patient Instructions (Signed)
 Medication Instructions:  Your physician recommends that you continue on your current medications as directed. Please refer to the Current Medication list given to you today.  *If you need a refill on your cardiac medications before your next appointment, please call your pharmacy*  Lab Work: None ordered today. If you have labs (blood work) drawn today and your tests are completely normal, you will receive your results only by: MyChart Message (if you have MyChart) OR A paper copy in the mail If you have any lab test that is abnormal or we need to change your treatment, we will call you to review the results.  Testing/Procedures: None ordered today.  Follow-Up: At [facility], you and your health needs are our priority.  As part of our continuing mission to provide you with exceptional heart care, we have created designated Provider Care Teams.  These Care Teams include your primary Cardiologist (physician) and Advanced Practice Providers (APPs -  Physician Assistants and Nurse Practitioners) who all work together to provide you with the care you need, when you need it.  We recommend signing up for the patient portal called "MyChart".  Sign up information is provided on this After Visit Summary.  MyChart is used to connect with patients for Virtual Visits (Telemedicine).  Patients are able to view lab/test results, encounter notes, upcoming appointments, etc.  Non-urgent messages can be sent to your provider as well.   To learn more about what you can do with MyChart, go to [email or web address].    Your next appointment:   1 year(s)  The format for your next appointment:   In Person  Provider:   Dorothye Gathers, MD {

## 2023-02-19 ENCOUNTER — Telehealth: Payer: Self-pay

## 2023-02-19 DIAGNOSIS — M47816 Spondylosis without myelopathy or radiculopathy, lumbar region: Secondary | ICD-10-CM | POA: Diagnosis not present

## 2023-02-19 NOTE — Telephone Encounter (Signed)
 Copied from CRM [id number]. Topic: Clinical - Medication Question >> Feb 19, 2023  5:12 PM Corin V wrote: Reason for CRM: Patient is due for her Prolia  shot and she is requesting that the person who handles the Prolia  shots to give her a call back.

## 2023-02-21 ENCOUNTER — Ambulatory Visit (INDEPENDENT_AMBULATORY_CARE_PROVIDER_SITE_OTHER): Payer: HMO

## 2023-02-21 DIAGNOSIS — M810 Age-related osteoporosis without current pathological fracture: Secondary | ICD-10-CM

## 2023-02-21 NOTE — Progress Notes (Signed)
Patient presented for Prolia injection to the right arm, patient voiced no concerns nor showed any signs of distress during injection

## 2023-02-21 NOTE — Telephone Encounter (Signed)
Patient came into office and Prolia shot was given. $323 was collected, patient has [employer] for insurance.

## 2023-03-05 DIAGNOSIS — L821 Other seborrheic keratosis: Secondary | ICD-10-CM | POA: Diagnosis not present

## 2023-03-05 DIAGNOSIS — L853 Xerosis cutis: Secondary | ICD-10-CM | POA: Diagnosis not present

## 2023-03-17 ENCOUNTER — Other Ambulatory Visit: Payer: Self-pay | Admitting: Internal Medicine

## 2023-03-30 ENCOUNTER — Other Ambulatory Visit: Payer: Self-pay | Admitting: Internal Medicine

## 2023-04-21 DIAGNOSIS — M533 Sacrococcygeal disorders, not elsewhere classified: Secondary | ICD-10-CM | POA: Diagnosis not present

## 2023-04-28 ENCOUNTER — Telehealth: Payer: Self-pay

## 2023-04-28 DIAGNOSIS — Z170 Estrogen receptor positive status [ER+]: Secondary | ICD-10-CM | POA: Diagnosis not present

## 2023-04-28 DIAGNOSIS — C50212 Malignant neoplasm of upper-inner quadrant of left female breast: Secondary | ICD-10-CM | POA: Diagnosis not present

## 2023-04-28 NOTE — Telephone Encounter (Signed)
 Attempted to call pt per MD. LVM for call back so we can give MD recommendation.

## 2023-04-28 NOTE — Telephone Encounter (Signed)
-----   Message from Tamsen Meek sent at 04/28/2023  3:58 PM EDT ----- Darrick Huntsman, Agree with holding for 2 weeks Windell [place],  Please ask her to hold Anastrozole for 2 weesk and arrange a telephone follow up with me after 2 weeks to discuss it Thanks Vinay ----- Message ----- From: Almond Lint, MD Sent: 04/28/2023   3:17 PM EDT To: Donnelly Angelica, RN; Pershing Proud, RN; #  Dr. Pamelia Hoit,  This lady is having a lot of soreness and thinks it is the anastrozole.  I was trying to tease out from her how much this was inhibiting her, but got some contradictory answers.  She does have to stop what she is doing due to the soreness almost every day, but is able to do a lot of household activities.  Would you recommend a trial off the anastrozole or just stay the course?  She isn't seeing you until June/July ish.  Fb

## 2023-04-29 ENCOUNTER — Telehealth: Payer: Self-pay | Admitting: *Deleted

## 2023-04-29 NOTE — Telephone Encounter (Signed)
 Pt returning missed call to our office. Per MD pt needing to stop Anastrozole x2 weeks and f/u with telephone visit.  Pt educated, f/u scheduled, and verbalized understanding.

## 2023-05-01 DIAGNOSIS — Z961 Presence of intraocular lens: Secondary | ICD-10-CM | POA: Diagnosis not present

## 2023-05-13 ENCOUNTER — Other Ambulatory Visit: Payer: Self-pay | Admitting: Internal Medicine

## 2023-05-13 ENCOUNTER — Inpatient Hospital Stay: Attending: Hematology and Oncology | Admitting: Hematology and Oncology

## 2023-05-13 DIAGNOSIS — C50212 Malignant neoplasm of upper-inner quadrant of left female breast: Secondary | ICD-10-CM | POA: Diagnosis not present

## 2023-05-13 DIAGNOSIS — Z170 Estrogen receptor positive status [ER+]: Secondary | ICD-10-CM | POA: Diagnosis not present

## 2023-05-13 NOTE — Assessment & Plan Note (Signed)
 08/30/2022:Screening mammogram detected left breast mass 6 mm by ultrasound.  Biopsy UIQ: ADH involving small intraductal papilloma/radial sclerosing lesion.  Mass upper inner quadrant: Grade 2 IDC with mucinous features with DCIS low-grade, ER 100%, PR 100%, Ki-67 2%, HER2 1+ negative    09/19/2022: Left lumpectomy: Grade 2 IDC with extracellular mucin 0.7 cm with DCIS intermediate grade, margins negative, for vascular invasion not identified, lateral margin: DCIS involving intraductal papilloma, all margins negative, ER 100%, PR 100%, HER2 1+, Ki-67 2%  Treatment plan: Antiestrogen therapy with anastrozole 1 mg daily x 5 years started 10/09/2022-04/29/2023 (due to muscle aches and pains) Anastrozole toxicities: Muscle aches and pains

## 2023-05-13 NOTE — Progress Notes (Signed)
 HEMATOLOGY-ONCOLOGY TELEPHONE VISIT PROGRESS NOTE  I connected with our patient on 05/13/23 at  1:30 PM EDT by telephone and verified that I am speaking with the correct person using two identifiers.  I discussed the limitations, risks, security and privacy concerns of performing an evaluation and management service by telephone and the availability of in person appointments.  I also discussed with the patient that there may be a patient responsible charge related to this service. The patient expressed understanding and agreed to proceed.   History of Present Illness: Telephone follow-up after stopping anastrozole for 2 weeks  History of Present Illness The patient, with a history of breast cancer and arthritis, presents with persistent body soreness. She recently discontinued anastrozole for two weeks to assess if it was contributing to her discomfort. However, she did not notice a significant difference in her symptoms during this period. The patient also reports that her arthritis occasionally requires injections for management. She has not experienced any other side effects from anastrozole.    Oncology History  Malignant neoplasm of upper-inner quadrant of left breast in female, estrogen receptor positive (HCC)  08/30/2022 Initial Diagnosis   Screening mammogram detected left breast mass 6 mm by ultrasound.  Biopsy UIQ: ADH involving small intraductal papilloma/radial sclerosing lesion.  Mass upper inner quadrant: Grade 2 IDC with mucinous features with DCIS low-grade, ER 100%, PR 100%, Ki-67 2%, HER2 1+ negative   09/03/2022 Cancer Staging   Staging form: Breast, AJCC 8th Edition - Clinical stage from 09/03/2022: Stage IA (cT1b, cN0, cM0, G2, ER+, PR+, HER2-) - Signed by Serena Croissant, MD on 09/11/2022 Stage prefix: Initial diagnosis Method of lymph node assessment: Clinical Histologic grading system: 3 grade system   09/19/2022 Surgery   Left lumpectomy: Grade 2 IDC with extracellular mucin  0.7 cm with DCIS intermediate grade, margins negative, for vascular invasion not identified, lateral margin: DCIS involving intraductal papilloma, all margins negative, ER 100%, PR 100%, HER2 1+, Ki-67 2%    09/19/2022 Cancer Staging   Staging form: Breast, AJCC 8th Edition - Pathologic stage from 09/19/2022: Stage IA (pT1b, pN0, cM0, G2, ER+, PR+, HER2-) - Signed by Loa Socks, NP on 01/07/2023 Stage prefix: Initial diagnosis Histologic grading system: 3 grade system   10/2022 -  Anti-estrogen oral therapy   1 mg Anastrozole x 5 years     REVIEW OF SYSTEMS:   Constitutional: Denies fevers, chills or abnormal weight loss All other systems were reviewed with the patient and are negative. Observations/Objective:     Assessment Plan:  Malignant neoplasm of upper-inner quadrant of left breast in female, estrogen receptor positive (HCC) 08/30/2022:Screening mammogram detected left breast mass 6 mm by ultrasound.  Biopsy UIQ: ADH involving small intraductal papilloma/radial sclerosing lesion.  Mass upper inner quadrant: Grade 2 IDC with mucinous features with DCIS low-grade, ER 100%, PR 100%, Ki-67 2%, HER2 1+ negative    09/19/2022: Left lumpectomy: Grade 2 IDC with extracellular mucin 0.7 cm with DCIS intermediate grade, margins negative, for vascular invasion not identified, lateral margin: DCIS involving intraductal papilloma, all margins negative, ER 100%, PR 100%, HER2 1+, Ki-67 2%  Treatment plan: Antiestrogen therapy with anastrozole 1 mg daily x 5 years started 10/09/2022-04/29/2023 (due to muscle aches and pains) Anastrozole toxicities: Muscle aches and pains Couldn't tell a lot of difference when she came off Anastrozole She will restart it Advised her to take Turmeric  Follow-up in June at her regularly scheduled appointment.  I discussed the assessment and treatment plan with  the patient. The patient was provided an opportunity to ask questions and all were answered. The  patient agreed with the plan and demonstrated an understanding of the instructions. The patient was advised to call back or seek an in-person evaluation if the symptoms worsen or if the condition fails to improve as anticipated.   I provided 20 minutes of non-face-to-face time during this encounter.  This includes time for charting and coordination of care   Tamsen Meek, MD

## 2023-05-14 ENCOUNTER — Ambulatory Visit (HOSPITAL_COMMUNITY): Payer: Medicare HMO | Admitting: Internal Medicine

## 2023-05-14 ENCOUNTER — Ambulatory Visit (INDEPENDENT_AMBULATORY_CARE_PROVIDER_SITE_OTHER): Payer: HMO | Admitting: Orthopaedic Surgery

## 2023-05-14 ENCOUNTER — Encounter: Payer: Self-pay | Admitting: Orthopaedic Surgery

## 2023-05-14 DIAGNOSIS — G8929 Other chronic pain: Secondary | ICD-10-CM

## 2023-05-14 DIAGNOSIS — M25562 Pain in left knee: Secondary | ICD-10-CM | POA: Diagnosis not present

## 2023-05-14 DIAGNOSIS — M25512 Pain in left shoulder: Secondary | ICD-10-CM | POA: Diagnosis not present

## 2023-05-14 MED ORDER — METHYLPREDNISOLONE ACETATE 40 MG/ML IJ SUSP
40.0000 mg | INTRAMUSCULAR | Status: AC | PRN
  Administered 2023-05-14: 40 mg via INTRA_ARTICULAR

## 2023-05-14 MED ORDER — LIDOCAINE HCL 1 % IJ SOLN
3.0000 mL | INTRAMUSCULAR | Status: AC | PRN
  Administered 2023-05-14: 3 mL

## 2023-05-14 NOTE — Progress Notes (Signed)
 The patient is a 83 year old well-known to [place].  She comes in about every 3 months or so for steroid injection in her left shoulder and the left knee.  She is diabetic but still has good blood glucose control.  She would like to have a steroid injection in her left shoulder and her left knee today.  She denies any change in her medical status.  Her left shoulder left knee both have pain throughout the arc of motion.  There is otherwise nothing change definitively in her exam when compared to previous exams.  Her request I did place a steroid injection of her left shoulder and her left knee without any difficulty.  We would like to try to wait at least 4 months between injections and we can.    Procedure Note  Patient: Lacey Brown             Date of Birth: 1940/03/25           MRN: [id number]             Visit Date: 05/14/2023  Procedures: Visit Diagnoses:  1. Chronic left shoulder pain   2. Chronic pain of left knee     Large Joint Inj: L knee on 05/14/2023 1:08 PM Indications: diagnostic evaluation and pain Details: 22 G 1.5 in needle, superolateral approach  Arthrogram: No  Medications: 3 mL lidocaine 1 %; 40 mg methylPREDNISolone acetate 40 MG/ML Outcome: tolerated well, no immediate complications Procedure, treatment alternatives, risks and benefits explained, specific risks discussed. Consent was given by the patient. Immediately prior to procedure a time out was called to verify the correct patient, procedure, equipment, support staff and site/side marked as required. Patient was prepped and draped in the usual sterile fashion.    Large Joint Inj: L subacromial bursa on 05/14/2023 1:08 PM Indications: pain and diagnostic evaluation Details: 22 G 1.5 in needle  Arthrogram: No  Medications: 3 mL lidocaine 1 %; 40 mg methylPREDNISolone acetate 40 MG/ML Outcome: tolerated well, no immediate complications Procedure, treatment alternatives, risks and benefits explained,  specific risks discussed. Consent was given by the patient. Immediately prior to procedure a time out was called to verify the correct patient, procedure, equipment, support staff and site/side marked as required. Patient was prepped and draped in the usual sterile fashion.

## 2023-05-29 DIAGNOSIS — H35371 Puckering of macula, right eye: Secondary | ICD-10-CM | POA: Diagnosis not present

## 2023-05-29 DIAGNOSIS — Z961 Presence of intraocular lens: Secondary | ICD-10-CM | POA: Diagnosis not present

## 2023-05-29 DIAGNOSIS — H26493 Other secondary cataract, bilateral: Secondary | ICD-10-CM | POA: Diagnosis not present

## 2023-05-29 DIAGNOSIS — H26491 Other secondary cataract, right eye: Secondary | ICD-10-CM | POA: Diagnosis not present

## 2023-05-29 DIAGNOSIS — H18413 Arcus senilis, bilateral: Secondary | ICD-10-CM | POA: Diagnosis not present

## 2023-06-02 DIAGNOSIS — M47816 Spondylosis without myelopathy or radiculopathy, lumbar region: Secondary | ICD-10-CM | POA: Diagnosis not present

## 2023-06-11 ENCOUNTER — Ambulatory Visit: Payer: HMO | Admitting: Internal Medicine

## 2023-06-11 ENCOUNTER — Encounter: Payer: Self-pay | Admitting: Internal Medicine

## 2023-06-11 VITALS — BP 124/66 | HR 70 | Temp 98.0°F | Ht 61.0 in | Wt 157.4 lb

## 2023-06-11 DIAGNOSIS — J432 Centrilobular emphysema: Secondary | ICD-10-CM

## 2023-06-11 DIAGNOSIS — R7303 Prediabetes: Secondary | ICD-10-CM | POA: Diagnosis not present

## 2023-06-11 DIAGNOSIS — R5383 Other fatigue: Secondary | ICD-10-CM | POA: Diagnosis not present

## 2023-06-11 DIAGNOSIS — I4819 Other persistent atrial fibrillation: Secondary | ICD-10-CM | POA: Diagnosis not present

## 2023-06-11 DIAGNOSIS — D6869 Other thrombophilia: Secondary | ICD-10-CM

## 2023-06-11 DIAGNOSIS — E7800 Pure hypercholesterolemia, unspecified: Secondary | ICD-10-CM

## 2023-06-11 DIAGNOSIS — K2900 Acute gastritis without bleeding: Secondary | ICD-10-CM

## 2023-06-11 DIAGNOSIS — C50212 Malignant neoplasm of upper-inner quadrant of left female breast: Secondary | ICD-10-CM | POA: Diagnosis not present

## 2023-06-11 DIAGNOSIS — I10 Essential (primary) hypertension: Secondary | ICD-10-CM

## 2023-06-11 DIAGNOSIS — E871 Hypo-osmolality and hyponatremia: Secondary | ICD-10-CM | POA: Diagnosis not present

## 2023-06-11 DIAGNOSIS — K5909 Other constipation: Secondary | ICD-10-CM | POA: Diagnosis not present

## 2023-06-11 DIAGNOSIS — M8080XG Other osteoporosis with current pathological fracture, unspecified site, subsequent encounter for fracture with delayed healing: Secondary | ICD-10-CM | POA: Diagnosis not present

## 2023-06-11 MED ORDER — ROSUVASTATIN CALCIUM 10 MG PO TABS: 10.0000 mg | ORAL_TABLET | Freq: Every day | ORAL | 1 refills | Status: DC

## 2023-06-11 MED ORDER — TELMISARTAN 80 MG PO TABS
80.0000 mg | ORAL_TABLET | Freq: Every day | ORAL | 1 refills | Status: DC
Start: 2023-06-11 — End: 2023-12-05

## 2023-06-11 MED ORDER — METOPROLOL SUCCINATE ER 25 MG PO TB24: 25.0000 mg | ORAL_TABLET | Freq: Every day | ORAL | 1 refills | Status: DC

## 2023-06-11 MED ORDER — AMLODIPINE BESYLATE 2.5 MG PO TABS: 2.5000 mg | ORAL_TABLET | Freq: Every day | ORAL | 1 refills | Status: DC

## 2023-06-11 NOTE — Assessment & Plan Note (Signed)
 Managed with  Prolia ; last dose was in  mid January 2025

## 2023-06-11 NOTE — Assessment & Plan Note (Signed)
 secondary to atrial fibrillation.  She is  reminded of the increased  risk for stroke .  She is tolerating use of Eliquis for embolic stroke risk mitigation due to  atrial fibrillation. Patient has no signs of bleeding and is advised to notify her specialists prior to any procedure that may required suspension of Eliquis

## 2023-06-11 NOTE — Progress Notes (Signed)
 Subjective:  Patient ID: Lacey Brown, female    DOB: Dec 16, 1940  Age: 83 y.o. MRN: [id number]  CC: The primary encounter diagnosis was Primary hypertension. Diagnoses of Acute gastritis, presence of bleeding unspecified, unspecified gastritis type, Pure hypercholesterolemia, Prediabetes, Other fatigue, Other osteoporosis with current pathological fracture with delayed healing, subsequent encounter, Hypercoagulable state due to persistent atrial fibrillation (HCC), Chronic constipation, Chronic hyponatremia, Centrilobular emphysema (HCC), and Malignant neoplasm of upper-inner quadrant of left breast in female, estrogen receptor positive (HCC) were also pertinent to this visit.   HPI Lacey Brown presents for  Chief Complaint  Patient presents with   Medical Management of Chronic Issues    6 month follow up    1) Diffuse joint pain : did not improve with suspension of anastrozole  for several weeks. So she has resumed the anastrazole.  Her back pain  is aggravated by yard work, any  activities that include repetitive bending over ,  and has resulted in a slower gait.  She has had no balance issues or falls and  is able to demonstrate rising up from a chair without use of arms .  She is taking tylenol  1150 mg  in the morning. 500 mg In the afternoon , if needed, and 650 mg at bedtime . She is under treatment by orthopedics and had some type of SI nerve procedure by Dr Crecencio Dodge  that provided relief of the SI joint pain    Outpatient Medications Prior to Visit  Medication Sig Dispense Refill   acetaminophen  (TYLENOL ) 650 MG CR tablet Take 2 tablets by mouth 2 (two) times daily.     albuterol  (VENTOLIN  HFA) 108 (90 Base) MCG/ACT inhaler Inhale 2 puffs into the lungs every 6 (six) hours as needed for wheezing or shortness of breath. 3.7 g 11   anastrozole  (ARIMIDEX ) 1 MG tablet Take 1 tablet (1 mg total) by mouth daily. 90 tablet 3   apixaban  (ELIQUIS ) 5 MG TABS tablet Take 1 tablet  (5 mg total) by mouth 2 (two) times daily. 180 tablet 6   denosumab  (PROLIA ) 60 MG/ML SOSY injection Inject 60 mg into the skin every 6 (six) months.     omeprazole  (PRILOSEC  OTC) 20 MG tablet Take 1 tablet (20 mg total) by mouth daily. In the morning on an empty stomach (Patient taking differently: Take 20 mg by mouth daily as needed (acid reflux).) 90 tablet 1   trolamine salicylate (ASPERCREME) 10 % cream Apply 1 Application topically as needed for muscle pain.     WIXELA INHUB 250-50 MCG/ACT AEPB INHALE 1 PUFF INTO THE LUNGS IN THE MORNING AND AT BEDTIME. 60 each 1   amLODipine  (NORVASC ) 2.5 MG tablet TAKE 1 TABLET BY MOUTH EVERY DAY 90 tablet 2   metoprolol  succinate (TOPROL -XL) 25 MG 24 hr tablet TAKE 1 TABLET (25 MG TOTAL) BY MOUTH DAILY. 90 tablet 1   rosuvastatin  (CRESTOR ) 10 MG tablet Take 1 tablet (10 mg total) by mouth daily. 90 tablet 1   telmisartan  (MICARDIS ) 80 MG tablet Take 1 tablet (80 mg total) by mouth daily. 90 tablet 1   No facility-administered medications prior to visit.    Review of Systems;  Patient denies headache, fevers, malaise, unintentional weight loss, skin rash, eye pain, sinus congestion and sinus pain, sore throat, dysphagia,  hemoptysis , cough, dyspnea, wheezing, chest pain, palpitations, orthopnea, edema, abdominal pain, nausea, melena, diarrhea, constipation, flank pain, dysuria, hematuria, urinary  Frequency, nocturia, numbness, tingling, seizures,  Focal weakness, Loss of  consciousness,  Tremor, insomnia, depression, anxiety, and suicidal ideation.      Objective:  BP 124/66   Pulse 70   Temp 98 F (36.7 C) (Oral)   Ht 5\' 1"  (1.549 m)   Wt 157 lb 6.4 oz (71.4 kg)   SpO2 95%   BMI 29.74 kg/m   BP Readings from Last 3 Encounters:  06/11/23 124/66  02/18/23 134/80  01/08/23 (!) 156/63    Wt Readings from Last 3 Encounters:  06/11/23 157 lb 6.4 oz (71.4 kg)  02/18/23 158 lb 12.8 oz (72 kg)  01/08/23 160 lb 6.4 oz (72.8 kg)    Physical  Exam Vitals reviewed.  Constitutional:      General: She is not in acute distress.    Appearance: Normal appearance. She is normal weight. She is not ill-appearing, toxic-appearing or diaphoretic.  HENT:     Head: Normocephalic.  Eyes:     General: No scleral icterus.       Right eye: No discharge.        Left eye: No discharge.     Conjunctiva/sclera: Conjunctivae normal.  Cardiovascular:     Rate and Rhythm: Normal rate and regular rhythm.     Heart sounds: Normal heart sounds.  Pulmonary:     Effort: Pulmonary effort is normal. No respiratory distress.     Breath sounds: Normal breath sounds.  Musculoskeletal:        General: Normal range of motion.  Skin:    General: Skin is warm and dry.  Neurological:     General: No focal deficit present.     Mental Status: She is alert and oriented to person, place, and time. Mental status is at baseline.  Psychiatric:        Mood and Affect: Mood normal.        Behavior: Behavior normal.        Thought Content: Thought content normal.        Judgment: Judgment normal.    Lab Results  Component Value Date   HGBA1C 6.2 06/11/2023   HGBA1C 6.4 12/11/2022   HGBA1C 6.1 (H) 06/06/2022    Lab Results  Component Value Date   CREATININE 0.62 06/11/2023   CREATININE 0.63 12/11/2022   CREATININE 0.66 10/09/2022    Lab Results  Component Value Date   WBC 7.1 06/11/2023   HGB 11.9 (L) 06/11/2023   HCT 36.2 06/11/2023   PLT 224.0 06/11/2023   GLUCOSE 90 06/11/2023   CHOL 133 06/11/2023   TRIG 50.0 06/11/2023   HDL 59.80 06/11/2023   LDLDIRECT 53.0 06/11/2023   LDLCALC 63 06/11/2023   ALT 14 06/11/2023   AST 15 06/11/2023   NA 131 (L) 06/11/2023   K 4.7 06/11/2023   CL 97 06/11/2023   CREATININE 0.62 06/11/2023   BUN 22 06/11/2023   CO2 27 06/11/2023   TSH 2.00 06/11/2023   HGBA1C 6.2 06/11/2023   MICROALBUR <0.7 06/11/2023    No results found.  Assessment & Plan:  .Primary hypertension Assessment & Plan: Improved  control with amlodipine  2.5 mg telmisartan  80 mg and  metoprolol  25 mg   daily.  Home readings have been  < 140/90  Orders: -     Comprehensive metabolic panel with GFR -     Microalbumin / creatinine urine ratio  Acute gastritis, presence of bleeding unspecified, unspecified gastritis type -     Metoprolol  Succinate ER; Take 1 tablet (25 mg total) by mouth daily.  Dispense: 90 tablet;  Refill: 1  Pure hypercholesterolemia -     Lipid panel -     LDL cholesterol, direct  Prediabetes -     Hemoglobin A1c -     Comprehensive metabolic panel with GFR -     Microalbumin / creatinine urine ratio  Other fatigue -     CBC with Differential/Platelet -     TSH  Other osteoporosis with current pathological fracture with delayed healing, subsequent encounter Assessment & Plan: Managed with  Prolia ; last dose was in  mid January 2025   Hypercoagulable state due to persistent atrial fibrillation [facility]) Assessment & Plan: secondary to atrial fibrillation.  She is  reminded of the increased  risk for stroke .  She is tolerating use of Eliquis  for embolic stroke risk mitigation due to  atrial fibrillation. Patient has no signs of bleeding and is advised to notify her specialists prior to any procedure that may required suspension of Eliquis      Chronic constipation Assessment & Plan: Has occasional problems when out of town , managed with metmucil and stool softener    Chronic hyponatremia Assessment & Plan: Chronic and stable for years.  Drinks water and unsweet tea. Salts food to taste  Not taking a diuretic ; likely due to COPD   Lab Results  Component Value Date   NA 131 (L) 06/11/2023   K 4.7 06/11/2023   CL 97 06/11/2023   CO2 27 06/11/2023      Centrilobular emphysema (HCC) Assessment & Plan: Managed with Wixela.   She  is reminded to use it two times daily    Malignant neoplasm of upper-inner quadrant of left breast in female, estrogen receptor positive (HCC) Assessment  & Plan: Anastrozole  toxicities: Muscle aches and pains did not improve with suspension of anastrozole  so she has resumed it.     Other orders -     amLODIPine  Besylate; Take 1 tablet (2.5 mg total) by mouth daily.  Dispense: 90 tablet; Refill: 1 -     Rosuvastatin  Calcium ; Take 1 tablet (10 mg total) by mouth daily.  Dispense: 90 tablet; Refill: 1 -     Telmisartan ; Take 1 tablet (80 mg total) by mouth daily.  Dispense: 90 tablet; Refill: 1     I spent 34 minutes on the day of this face to face encounter reviewing patient's  most recent visit with oncology,  cardiology,   prior relevant surgical and non surgical procedures, recent  labs and imaging studies,  reviewing the assessment and plan with patient, and post visit ordering and reviewing of  diagnostics and therapeutics with patient  .   Follow-up: Return in about 6 months (around 12/12/2023).   Thersia Flax, MD

## 2023-06-11 NOTE — Patient Instructions (Addendum)
 Tyr using the Salon Pas with lidocaine  patch  for your back   BP is fine today   I recommend joining a Silver sneakers exercise class.  There are many local gyms that provide it Including [facility]   [address]

## 2023-06-11 NOTE — Assessment & Plan Note (Signed)
 Has occasional problems when out of town , managed with metmucil and stool softener

## 2023-06-12 LAB — MICROALBUMIN / CREATININE URINE RATIO
Creatinine,U: 15.6 mg/dL
Microalb Creat Ratio: UNDETERMINED mg/g (ref 0.0–30.0)
Microalb, Ur: <0.7 mg/dL

## 2023-06-12 LAB — CBC WITH DIFFERENTIAL/PLATELET
Basophils Absolute: 0.1 10*3/uL (ref 0.0–0.1)
Basophils Relative: 1.1 % (ref 0.0–3.0)
Eosinophils Absolute: 0.2 10*3/uL (ref 0.0–0.7)
Eosinophils Relative: 2.6 % (ref 0.0–5.0)
HCT: 36.2 % (ref 36.0–46.0)
Hemoglobin: 11.9 g/dL — ABNORMAL LOW (ref 12.0–15.0)
Lymphocytes Relative: 23.6 % (ref 12.0–46.0)
Lymphs Abs: 1.7 10*3/uL (ref 0.7–4.0)
MCHC: 33 g/dL (ref 30.0–36.0)
MCV: 92.7 fl (ref 78.0–100.0)
Monocytes Absolute: 0.9 10*3/uL (ref 0.1–1.0)
Monocytes Relative: 12.8 % — ABNORMAL HIGH (ref 3.0–12.0)
Neutro Abs: 4.3 10*3/uL (ref 1.4–7.7)
Neutrophils Relative %: 59.9 % (ref 43.0–77.0)
Platelets: 224 10*3/uL (ref 150.0–400.0)
RBC: 3.9 Mil/uL (ref 3.87–5.11)
RDW: 13.8 % (ref 11.5–15.5)
WBC: 7.1 10*3/uL (ref 4.0–10.5)

## 2023-06-12 LAB — LIPID PANEL
Cholesterol: 133 mg/dL (ref 0–200)
HDL: 59.8 mg/dL (ref >39.00)
LDL Cholesterol: 63 mg/dL (ref 0–99)
NonHDL: 73.09
Total CHOL/HDL Ratio: 2
Triglycerides: 50 mg/dL (ref 0.0–149.0)
VLDL: 10 mg/dL (ref 0.0–40.0)

## 2023-06-12 LAB — COMPREHENSIVE METABOLIC PANEL WITH GFR
ALT: 14 U/L (ref 0–35)
AST: 15 U/L (ref 0–37)
Albumin: 4.2 g/dL (ref 3.5–5.2)
Alkaline Phosphatase: 31 U/L — ABNORMAL LOW (ref 39–117)
BUN: 22 mg/dL (ref 6–23)
CO2: 27 meq/L (ref 19–32)
Calcium: 9.2 mg/dL (ref 8.4–10.5)
Chloride: 97 meq/L (ref 96–112)
Creatinine, Ser: 0.62 mg/dL (ref 0.40–1.20)
GFR: 81.89 mL/min (ref >60.00)
Glucose, Bld: 90 mg/dL (ref 70–99)
Potassium: 4.7 meq/L (ref 3.5–5.1)
Sodium: 131 meq/L — ABNORMAL LOW (ref 135–145)
Total Bilirubin: 0.4 mg/dL (ref 0.2–1.2)
Total Protein: 6.7 g/dL (ref 6.0–8.3)

## 2023-06-12 LAB — LDL CHOLESTEROL, DIRECT: Direct LDL: 53 mg/dL

## 2023-06-12 LAB — HEMOGLOBIN A1C: Hgb A1c MFr Bld: 6.2 % (ref 4.6–6.5)

## 2023-06-12 LAB — TSH: TSH: 2 u[IU]/mL (ref 0.35–5.50)

## 2023-06-13 ENCOUNTER — Telehealth: Payer: Self-pay

## 2023-06-13 DIAGNOSIS — H26492 Other secondary cataract, left eye: Secondary | ICD-10-CM | POA: Diagnosis not present

## 2023-06-13 NOTE — Assessment & Plan Note (Signed)
 Managed with Wixela.   She  is reminded to use it two times daily

## 2023-06-13 NOTE — Assessment & Plan Note (Signed)
 Chronic and stable for years.  Drinks water and unsweet tea. Salts food to taste  Not taking a diuretic ; likely due to COPD   Lab Results  Component Value Date   NA 131 (L) 06/11/2023   K 4.7 06/11/2023   CL 97 06/11/2023   CO2 27 06/11/2023

## 2023-06-13 NOTE — Telephone Encounter (Signed)
 Copied from CRM [id number]. Topic: General - Other >> Jun 13, 2023  8:55 AM Lacey Brown wrote: Reason for CRM: Patient called in wanting to speak with Lacey Brown regarding some information Lacey Brown needed. Please call [phone]  I spoke with patient and she states Lacey Brown from [facility] [phone]) is the doctor that she sees.  Patient states she has already signed a Release of Information form for us  so we can obtain her records.

## 2023-06-13 NOTE — Assessment & Plan Note (Signed)
 Anastrozole  toxicities: Muscle aches and pains did not improve with suspension of anastrozole  so she has resumed it.

## 2023-06-13 NOTE — Assessment & Plan Note (Signed)
Improved contro lwith amlodipine 2.5 mg telmisartan 80 mg and  metoprolol 25 mg   daily.  Home readings have been  < 140/90

## 2023-06-17 NOTE — Telephone Encounter (Signed)
 faxed

## 2023-06-19 DIAGNOSIS — M47816 Spondylosis without myelopathy or radiculopathy, lumbar region: Secondary | ICD-10-CM | POA: Diagnosis not present

## 2023-07-17 ENCOUNTER — Inpatient Hospital Stay: Payer: Medicare HMO | Attending: Hematology and Oncology | Admitting: Hematology and Oncology

## 2023-07-17 VITALS — BP 113/71 | HR 73 | Temp 98.1°F | Resp 17 | Ht 61.0 in | Wt 156.1 lb

## 2023-07-17 DIAGNOSIS — Z170 Estrogen receptor positive status [ER+]: Secondary | ICD-10-CM | POA: Diagnosis not present

## 2023-07-17 DIAGNOSIS — C50212 Malignant neoplasm of upper-inner quadrant of left female breast: Secondary | ICD-10-CM | POA: Insufficient documentation

## 2023-07-17 DIAGNOSIS — G8929 Other chronic pain: Secondary | ICD-10-CM | POA: Insufficient documentation

## 2023-07-17 DIAGNOSIS — M79605 Pain in left leg: Secondary | ICD-10-CM | POA: Insufficient documentation

## 2023-07-17 DIAGNOSIS — M545 Low back pain, unspecified: Secondary | ICD-10-CM | POA: Insufficient documentation

## 2023-07-17 DIAGNOSIS — M79604 Pain in right leg: Secondary | ICD-10-CM | POA: Insufficient documentation

## 2023-07-17 DIAGNOSIS — Z79811 Long term (current) use of aromatase inhibitors: Secondary | ICD-10-CM | POA: Diagnosis not present

## 2023-07-17 NOTE — Assessment & Plan Note (Signed)
 08/30/2022:Screening mammogram detected left breast mass 6 mm by ultrasound.  Biopsy UIQ: ADH involving small intraductal papilloma/radial sclerosing lesion.  Mass upper inner quadrant: Grade 2 IDC with mucinous features with DCIS low-grade, ER 100%, PR 100%, Ki-67 2%, HER2 1+ negative    09/19/2022: Left lumpectomy: Grade 2 IDC with extracellular mucin 0.7 cm with DCIS intermediate grade, margins negative, for vascular invasion not identified, lateral margin: DCIS involving intraductal papilloma, all margins negative, ER 100%, PR 100%, HER2 1+, Ki-67 2%   Treatment plan: Antiestrogen therapy with anastrozole  1 mg daily x 5 years started 10/09/2022-04/29/2023 (due to muscle aches and pains)  Anastrozole  toxicities: Muscle aches and pains Couldn't tell a lot of difference when she came off Anastrozole  She will restart it Advised her to take Turmeric  Follow-up in June at her regularly scheduled appointment.

## 2023-07-17 NOTE — Progress Notes (Signed)
 Patient Care Team: Thersia Flax, MD as PCP - General (Internal Medicine) Hugh Madura, MD as PCP - Cardiology (Cardiology) Cameron Cea, MD as Consulting Physician (Hematology and Oncology) Lockie Rima, MD as Consulting Physician (General Surgery)  DIAGNOSIS:  Encounter Diagnosis  Name Primary?   Malignant neoplasm of upper-inner quadrant of left breast in female, estrogen receptor positive (HCC) Yes    SUMMARY OF ONCOLOGIC HISTORY: Oncology History  Malignant neoplasm of upper-inner quadrant of left breast in female, estrogen receptor positive (HCC)  08/30/2022 Initial Diagnosis   Screening mammogram detected left breast mass 6 mm by ultrasound.  Biopsy UIQ: ADH involving small intraductal papilloma/radial sclerosing lesion.  Mass upper inner quadrant: Grade 2 IDC with mucinous features with DCIS low-grade, ER 100%, PR 100%, Ki-67 2%, HER2 1+ negative   09/03/2022 Cancer Staging   Staging form: Breast, AJCC 8th Edition - Clinical stage from 09/03/2022: Stage IA (cT1b, cN0, cM0, G2, ER+, PR+, HER2-) - Signed by Cameron Cea, MD on 09/11/2022 Stage prefix: Initial diagnosis Method of lymph node assessment: Clinical Histologic grading system: 3 grade system   09/19/2022 Surgery   Left lumpectomy: Grade 2 IDC with extracellular mucin 0.7 cm with DCIS intermediate grade, margins negative, for vascular invasion not identified, lateral margin: DCIS involving intraductal papilloma, all margins negative, ER 100%, PR 100%, HER2 1+, Ki-67 2%    09/19/2022 Cancer Staging   Staging form: Breast, AJCC 8th Edition - Pathologic stage from 09/19/2022: Stage IA (pT1b, pN0, cM0, G2, ER+, PR+, HER2-) - Signed by Percival Brace, NP on 01/07/2023 Stage prefix: Initial diagnosis Histologic grading system: 3 grade system   10/2022 -  Anti-estrogen oral therapy   1 mg Anastrozole  x 5 years     CHIEF COMPLIANT: Follow-up to discuss antiestrogen therapy  HISTORY OF PRESENT ILLNESS: Ms.  Lacey Brown is a 83 year old with above-mentioned history of breast cancers currently on anastrozole .  Previously she had complained of muscle aches and pains and we took her off anastrozole  for 2 weeks but her symptoms did not improve so she restarted it.  She continues to have aches and pains especially in her legs and thighs once or twice a week but more chronically she has low back pain which is most likely related to arthritis and she has been receiving injections for that so far.  Denies any lumps or nodules in the breast.  She has a mammogram coming up on July 2.    ALLERGIES:  is allergic to ace inhibitors, carvedilol , codeine, and kenalog  [triamcinolone  acetonide].  MEDICATIONS:  Current Outpatient Medications  Medication Sig Dispense Refill   acetaminophen  (TYLENOL ) 650 MG CR tablet Take 2 tablets by mouth 2 (two) times daily.     albuterol  (VENTOLIN  HFA) 108 (90 Base) MCG/ACT inhaler Inhale 2 puffs into the lungs every 6 (six) hours as needed for wheezing or shortness of breath. 3.7 g 11   amLODipine  (NORVASC ) 2.5 MG tablet Take 1 tablet (2.5 mg total) by mouth daily. 90 tablet 1   anastrozole  (ARIMIDEX ) 1 MG tablet Take 1 tablet (1 mg total) by mouth daily. 90 tablet 3   apixaban  (ELIQUIS ) 5 MG TABS tablet Take 1 tablet (5 mg total) by mouth 2 (two) times daily. 180 tablet 6   denosumab  (PROLIA ) 60 MG/ML SOSY injection Inject 60 mg into the skin every 6 (six) months.     metoprolol  succinate (TOPROL -XL) 25 MG 24 hr tablet Take 1 tablet (25 mg total) by mouth daily. 90 tablet 1  rosuvastatin  (CRESTOR ) 10 MG tablet Take 1 tablet (10 mg total) by mouth daily. 90 tablet 1   telmisartan  (MICARDIS ) 80 MG tablet Take 1 tablet (80 mg total) by mouth daily. 90 tablet 1   trolamine salicylate (ASPERCREME) 10 % cream Apply 1 Application topically as needed for muscle pain.     WIXELA INHUB 250-50 MCG/ACT AEPB INHALE 1 PUFF INTO THE LUNGS IN THE MORNING AND AT BEDTIME. 60 each 1   BOOSTRIX  5-2.5-18.5 LF-MCG/0.5 injection  (Patient not taking: Reported on 07/17/2023)     omeprazole  (PRILOSEC  OTC) 20 MG tablet Take 1 tablet (20 mg total) by mouth daily. In the morning on an empty stomach (Patient not taking: Reported on 07/17/2023) 90 tablet 1   No current facility-administered medications for this visit.    PHYSICAL EXAMINATION: ECOG PERFORMANCE STATUS: 1 - Symptomatic but completely ambulatory  Vitals:   07/17/23 1000  BP: 113/71  Pulse: 73  Resp: 17  Temp: 98.1 F (36.7 C)  SpO2: 99%   Filed Weights   07/17/23 1000  Weight: 156 lb 1.6 oz (70.8 kg)      LABORATORY DATA:  I have reviewed the data as listed    Latest Ref Rng & Units 06/11/2023    2:15 PM 12/11/2022    2:10 PM 10/09/2022    9:17 AM  CMP  Glucose 70 - 99 mg/dL 90  86  213   BUN 6 - 23 mg/dL 22  19  20    Creatinine 0.40 - 1.20 mg/dL 0.86  5.78  4.69   Sodium 135 - 145 mEq/L 131  132  135   Potassium 3.5 - 5.1 mEq/L 4.7  4.6  5.1   Chloride 96 - 112 mEq/L 97  97  101   CO2 19 - 32 mEq/L 27  27  29    Calcium  8.4 - 10.5 mg/dL 9.2  9.5  9.5   Total Protein 6.0 - 8.3 g/dL 6.7   6.8   Total Bilirubin 0.2 - 1.2 mg/dL 0.4   0.5   Alkaline Phos 39 - 117 U/L 31   36   AST 0 - 37 U/L 15   14   ALT 0 - 35 U/L 14   15     Lab Results  Component Value Date   WBC 7.1 06/11/2023   HGB 11.9 (L) 06/11/2023   HCT 36.2 06/11/2023   MCV 92.7 06/11/2023   PLT 224.0 06/11/2023   NEUTROABS 4.3 06/11/2023    ASSESSMENT & PLAN:  Malignant neoplasm of upper-inner quadrant of left breast in female, estrogen receptor positive (HCC) 08/30/2022:Screening mammogram detected left breast mass 6 mm by ultrasound.  Biopsy UIQ: ADH involving small intraductal papilloma/radial sclerosing lesion.  Mass upper inner quadrant: Grade 2 IDC with mucinous features with DCIS low-grade, ER 100%, PR 100%, Ki-67 2%, HER2 1+ negative    09/19/2022: Left lumpectomy: Grade 2 IDC with extracellular mucin 0.7 cm with DCIS intermediate grade,  margins negative, for vascular invasion not identified, lateral margin: DCIS involving intraductal papilloma, all margins negative, ER 100%, PR 100%, HER2 1+, Ki-67 2%   Treatment plan: Antiestrogen therapy with anastrozole  1 mg daily x 5 years started 10/09/2022    Anastrozole  toxicities: Muscle aches and pains Recommended stopping Anastrozole  for 3 months to definitively determine if her symptoms are related to anastrozole    No orders of the defined types were placed in this encounter.  The patient has a good understanding of the overall plan. she agrees  with it. she will call with any problems that may develop before the next visit here. Total time spent: 30 mins including face to face time and time spent for planning, charting and co-ordination of care   Viinay K Tiaja Hagan, MD 07/17/23

## 2023-08-05 MED ORDER — DENOSUMAB 60 MG/ML ~~LOC~~ SOSY
60.0000 mg | PREFILLED_SYRINGE | Freq: Once | SUBCUTANEOUS | Status: AC
  Administered 2023-08-21: 60 mg via SUBCUTANEOUS

## 2023-08-05 NOTE — Addendum Note (Signed)
 Addended by: BRIEN SHARENE RAMAN on: 08/05/2023 03:44 PM   Modules accepted: Orders

## 2023-08-06 ENCOUNTER — Telehealth: Payer: Self-pay

## 2023-08-06 ENCOUNTER — Other Ambulatory Visit (HOSPITAL_COMMUNITY): Payer: Self-pay

## 2023-08-06 ENCOUNTER — Ambulatory Visit
Admission: RE | Admit: 2023-08-06 | Discharge: 2023-08-06 | Disposition: A | Discharge status: Home / Self Care | Payer: Medicare HMO | Source: Ambulatory Visit | Attending: Adult Health | Admitting: Adult Health

## 2023-08-06 DIAGNOSIS — Z853 Personal history of malignant neoplasm of breast: Secondary | ICD-10-CM | POA: Diagnosis not present

## 2023-08-06 DIAGNOSIS — Z08 Encounter for follow-up examination after completed treatment for malignant neoplasm: Secondary | ICD-10-CM | POA: Diagnosis not present

## 2023-08-06 DIAGNOSIS — C50212 Malignant neoplasm of upper-inner quadrant of left female breast: Secondary | ICD-10-CM

## 2023-08-06 NOTE — Telephone Encounter (Signed)
 Prolia  VOB initiated via [email or web address]  Next Prolia  inj DUE: 08/21/23

## 2023-08-06 NOTE — Telephone Encounter (Signed)
 Pt ready for scheduling for PROLIA  on or after : 08/21/23  Option# 1: Buy/Bill (Office supplied medication)  Out-of-pocket cost due at time of clinic visit: $332  Number of injection/visits approved: ---  Primary: [employer] Prolia  co-insurance: 20% Admin fee co-insurance: 0%  Secondary: --- Prolia  co-insurance:  Admin fee co-insurance:   Medical Benefit Details: Date Benefits were checked: 08/06/23 Deductible: NO/ Coinsurance: 20%/ Admin Fee: 0%  Prior Auth: N/A PA# Expiration Date:   # of doses approved: ----------------------------------------------------------------------- Option# 2- Med Obtained from pharmacy:  Pharmacy benefit: Copay $250 (Paid to pharmacy) Admin Fee: 0% (Pay at clinic)  Prior Auth: N/A PA# Expiration Date:   # of doses approved:   If patient wants fill through the pharmacy benefit please send prescription to: [employer] [facility], and include estimated need by date in rx notes. Pharmacy will ship medication directly to the office.  Patient NOT eligible for Prolia  Copay Card. Copay Card can make patient's cost as little as $25. Link to apply: [email or web address]://[email or web address]  ** This summary of benefits is an estimation of the patient's out-of-pocket cost. Exact cost may very based on individual plan coverage.

## 2023-08-21 ENCOUNTER — Ambulatory Visit: Payer: HMO

## 2023-08-21 ENCOUNTER — Telehealth: Payer: Self-pay

## 2023-08-21 DIAGNOSIS — M8080XG Other osteoporosis with current pathological fracture, unspecified site, subsequent encounter for fracture with delayed healing: Secondary | ICD-10-CM | POA: Diagnosis not present

## 2023-08-21 MED ORDER — DENOSUMAB 60 MG/ML ~~LOC~~ SOSY
60.0000 mg | PREFILLED_SYRINGE | SUBCUTANEOUS | Status: AC
  Administered 2024-02-23: 60 mg via SUBCUTANEOUS

## 2023-08-21 NOTE — Telephone Encounter (Signed)
 Spoke with pt in regards to her lab results when she came in today for her prolia  injection.

## 2023-08-21 NOTE — Telephone Encounter (Signed)
 Copied from CRM [id number]. Topic: Clinical - Lab/Test Results >> Aug 21, 2023 11:09 AM Franky GRADE wrote: Reason for CRM: Patient would like to speak with  Karen Farr, regarding labs done on 06/11/2023. She had some questions she didn't ask when she spoke with her.

## 2023-08-21 NOTE — Progress Notes (Signed)
 Pt presented today for her Prolia  injection. Left arm, SQ. Pt voiced no concern nor showed any sign of distress during injection.

## 2023-08-23 ENCOUNTER — Other Ambulatory Visit: Payer: Self-pay | Admitting: Internal Medicine

## 2023-09-10 ENCOUNTER — Encounter: Payer: Self-pay | Admitting: Orthopaedic Surgery

## 2023-09-10 ENCOUNTER — Ambulatory Visit: Admitting: Orthopaedic Surgery

## 2023-09-10 DIAGNOSIS — G8929 Other chronic pain: Secondary | ICD-10-CM

## 2023-09-10 DIAGNOSIS — M25562 Pain in left knee: Secondary | ICD-10-CM | POA: Diagnosis not present

## 2023-09-10 DIAGNOSIS — M25512 Pain in left shoulder: Secondary | ICD-10-CM

## 2023-09-10 MED ORDER — LIDOCAINE HCL 1 % IJ SOLN
3.0000 mL | INTRAMUSCULAR | Status: AC | PRN
  Administered 2023-09-10: 3 mL

## 2023-09-10 MED ORDER — LIDOCAINE HCL 1 % IJ SOLN
3.0000 mL | INTRAMUSCULAR | Status: AC | PRN
Start: 2023-09-10 — End: 2023-09-10
  Administered 2023-09-10: 3 mL

## 2023-09-10 MED ORDER — METHYLPREDNISOLONE ACETATE 40 MG/ML IJ SUSP
40.0000 mg | INTRAMUSCULAR | Status: AC | PRN
  Administered 2023-09-10: 40 mg via INTRA_ARTICULAR

## 2023-09-10 NOTE — Progress Notes (Signed)
 The patient is well-known to us .  She is an active 83 year old female who comes in about every 4 months for steroid injection in her left shoulder and her left knee.  She says they last for a while.  She has had no acute change in her medical status.  She would like to have steroid injections again today in both her left knee and her left shoulder.  She walks without any assistive device and is not on blood thinning medication.  Her last hemoglobin A1c was 6.2 a few months ago.  Examination of her left shoulder does show some signs of impingement and examination of her left knee shows some arthritic changes.  Overall they seem to be functioning well in terms of range of motion and stability.  Per her request I did place a steroid injection in her left shoulder subacromial outlet and her left knee joint without difficulty.  She knows to wait at least 4 months between injections.    Procedure Note  Patient: Lacey Brown             Date of Birth: 09-21-1940           MRN: [id number]             Visit Date: 09/10/2023  Procedures: Visit Diagnoses:  1. Chronic left shoulder pain   2. Chronic pain of left knee     Large Joint Inj: L knee on 09/10/2023 1:05 PM Indications: diagnostic evaluation and pain Details: 22 G 1.5 in needle, superolateral approach  Arthrogram: No  Medications: 3 mL lidocaine  1 %; 40 mg methylPREDNISolone  acetate 40 MG/ML Outcome: tolerated well, no immediate complications Procedure, treatment alternatives, risks and benefits explained, specific risks discussed. Consent was given by the patient. Immediately prior to procedure a time out was called to verify the correct patient, procedure, equipment, support staff and site/side marked as required. Patient was prepped and draped in the usual sterile fashion.    Large Joint Inj: L subacromial bursa on 09/10/2023 1:05 PM Indications: pain and diagnostic evaluation Details: 22 G 1.5 in needle  Arthrogram:  No  Medications: 3 mL lidocaine  1 %; 40 mg methylPREDNISolone  acetate 40 MG/ML Outcome: tolerated well, no immediate complications Procedure, treatment alternatives, risks and benefits explained, specific risks discussed. Consent was given by the patient. Immediately prior to procedure a time out was called to verify the correct patient, procedure, equipment, support staff and site/side marked as required. Patient was prepped and draped in the usual sterile fashion.

## 2023-09-17 ENCOUNTER — Encounter: Payer: Self-pay | Admitting: Internal Medicine

## 2023-09-17 ENCOUNTER — Ambulatory Visit: Admitting: Internal Medicine

## 2023-09-17 VITALS — BP 122/58 | HR 76 | Ht 61.0 in | Wt 154.0 lb

## 2023-09-17 DIAGNOSIS — D6869 Other thrombophilia: Secondary | ICD-10-CM

## 2023-09-17 DIAGNOSIS — H6120 Impacted cerumen, unspecified ear: Secondary | ICD-10-CM | POA: Insufficient documentation

## 2023-09-17 DIAGNOSIS — E559 Vitamin D deficiency, unspecified: Secondary | ICD-10-CM | POA: Diagnosis not present

## 2023-09-17 DIAGNOSIS — E7800 Pure hypercholesterolemia, unspecified: Secondary | ICD-10-CM | POA: Diagnosis not present

## 2023-09-17 DIAGNOSIS — I4819 Other persistent atrial fibrillation: Secondary | ICD-10-CM

## 2023-09-17 DIAGNOSIS — M810 Age-related osteoporosis without current pathological fracture: Secondary | ICD-10-CM

## 2023-09-17 DIAGNOSIS — M791 Myalgia, unspecified site: Secondary | ICD-10-CM | POA: Diagnosis not present

## 2023-09-17 DIAGNOSIS — T466X5A Adverse effect of antihyperlipidemic and antiarteriosclerotic drugs, initial encounter: Secondary | ICD-10-CM

## 2023-09-17 DIAGNOSIS — H6123 Impacted cerumen, bilateral: Secondary | ICD-10-CM

## 2023-09-17 DIAGNOSIS — I10 Essential (primary) hypertension: Secondary | ICD-10-CM

## 2023-09-17 DIAGNOSIS — Q069 Congenital malformation of spinal cord, unspecified: Secondary | ICD-10-CM

## 2023-09-17 NOTE — Progress Notes (Signed)
 Subjective:  Patient ID: Lacey Brown, female    DOB: 1941/01/26  Age: 83 y.o. MRN: [id number]  CC: The primary encounter diagnosis was Bilateral impacted cerumen. Diagnoses of Pure hypercholesterolemia, Persistent atrial fibrillation (HCC), Vitamin D  deficiency, Primary hypertension, Hypercoagulable state due to persistent atrial fibrillation (HCC), Split spinal cord malformation (HCC), Osteoporosis, unspecified osteoporosis type, unspecified pathological fracture presence, and Myalgia due to statin were also pertinent to this visit.   HPI Lacey Brown presents for  Chief Complaint  Patient presents with   Medical Management of Chronic Issues   Lacey Brown is a delightful 83 yr old female with a history of breast cancer currently on adjuvant therapy, aortic atherosclerosis, hypertension and chronic low back pain who presents for followup.  Chronic pain:  she recently suspended Anastrozole  (supervised by her oncologist) to determine if the medication was contributng to her diffuse pain involving arms and legs.  Her pain did not abate .  She is requesting a suspension of Crestor  as a trial .  She has sciatica secondary to multilevel degenerative disk disease in her lumbar spine but feels that the pain she is experiencing is not referred from her spine.and involves the muscles of both arms and legs.  Lacey Brown:  her lipids were checked in May and ad  improved with Crestor  .  She was prescribed Crestor   for management of  aortic atherosclerosis , which was noted on prior MRI of lumbar spine    Breast Cancer;  she has had no recurrence since treatment in 2024 with lumpectomy . Recent mammogram reviewed  Hypertension: patient checks blood pressure twice weekly at home.  Readings have been for the most part <130/80 at rest . Patient is following a reduced salt diet most days and is taking medications as prescribed   Outpatient Medications Prior to Visit  Medication Sig Dispense Refill    acetaminophen  (TYLENOL ) 650 MG CR tablet Take 2 tablets by mouth 2 (two) times daily.     albuterol  (VENTOLIN  HFA) 108 (90 Base) MCG/ACT inhaler Inhale 2 puffs into the lungs every 6 (six) hours as needed for wheezing or shortness of breath. 3.7 g 11   amLODipine  (NORVASC ) 2.5 MG tablet Take 1 tablet (2.5 mg total) by mouth daily. 90 tablet 1   anastrozole  (ARIMIDEX ) 1 MG tablet Take 1 tablet (1 mg total) by mouth daily. 90 tablet 3   apixaban  (ELIQUIS ) 5 MG TABS tablet Take 1 tablet (5 mg total) by mouth 2 (two) times daily. 180 tablet 6   denosumab  (PROLIA ) 60 MG/ML SOSY injection Inject 60 mg into the skin every 6 (six) months.     metoprolol  succinate (TOPROL -XL) 25 MG 24 hr tablet Take 1 tablet (25 mg total) by mouth daily. 90 tablet 1   omeprazole  (PRILOSEC  OTC) 20 MG tablet Take 1 tablet (20 mg total) by mouth daily. In the morning on an empty stomach 90 tablet 1   rosuvastatin  (CRESTOR ) 10 MG tablet Take 1 tablet (10 mg total) by mouth daily. 90 tablet 1   telmisartan  (MICARDIS ) 80 MG tablet Take 1 tablet (80 mg total) by mouth daily. 90 tablet 1   trolamine salicylate (ASPERCREME) 10 % cream Apply 1 Application topically as needed for muscle pain.     WIXELA INHUB 250-50 MCG/ACT AEPB INHALE 1 PUFF INTO THE LUNGS IN THE MORNING AND AT BEDTIME. 60 each 1   BOOSTRIX 5-2.5-18.5 LF-MCG/0.5 injection  (Patient not taking: Reported on 07/17/2023)     Facility-Administered Medications Prior  to Visit  Medication Dose Route Frequency Provider Last Rate Last Admin   [START ON 02/21/2024] denosumab  (PROLIA ) injection 60 mg  60 mg Subcutaneous Q6 months [name], MD        Review of Systems;  Patient denies headache, fevers, malaise, unintentional weight loss, skin rash, eye pain, sinus congestion and sinus pain, sore throat, dysphagia,  hemoptysis , cough, dyspnea, wheezing, chest pain, palpitations, orthopnea, edema, abdominal pain, nausea, melena, diarrhea, constipation, flank pain, dysuria,  hematuria, urinary  Frequency, nocturia, numbness, tingling, seizures,  Focal weakness, Loss of consciousness,  Tremor, insomnia, depression, anxiety, and suicidal ideation.      Objective:  BP (!) 122/58   Pulse 76   Ht 5' 1 (1.549 m)   Wt 154 lb (69.9 kg)   SpO2 95%   BMI 29.10 kg/m   BP Readings from Last 3 Encounters:  09/17/23 (!) 122/58  07/17/23 113/71  06/11/23 124/66    Wt Readings from Last 3 Encounters:  09/17/23 154 lb (69.9 kg)  07/17/23 156 lb 1.6 oz (70.8 kg)  06/11/23 157 lb 6.4 oz (71.4 kg)    Physical Exam Vitals reviewed.  Constitutional:      General: She is not in acute distress.    Appearance: Normal appearance. She is normal weight. She is not ill-appearing, toxic-appearing or diaphoretic.  HENT:     Head: Normocephalic.  Eyes:     General: No scleral icterus.       Right eye: No discharge.        Left eye: No discharge.     Conjunctiva/sclera: Conjunctivae normal.  Cardiovascular:     Rate and Rhythm: Normal rate and regular rhythm.     Heart sounds: Normal heart sounds.  Pulmonary:     Effort: Pulmonary effort is normal. No respiratory distress.     Breath sounds: Normal breath sounds.  Musculoskeletal:        General: No swelling, tenderness or deformity. Normal range of motion.  Skin:    General: Skin is warm and dry.  Neurological:     General: No focal deficit present.     Mental Status: She is alert and oriented to person, place, and time. Mental status is at baseline.  Psychiatric:        Mood and Affect: Mood normal.        Behavior: Behavior normal.        Thought Content: Thought content normal.        Judgment: Judgment normal.     Lab Results  Component Value Date   HGBA1C 6.2 06/11/2023   HGBA1C 6.4 12/11/2022   HGBA1C 6.1 (H) 06/06/2022    Lab Results  Component Value Date   CREATININE 0.62 06/11/2023   CREATININE 0.63 12/11/2022   CREATININE 0.66 10/09/2022    Lab Results  Component Value Date   WBC 7.1  06/11/2023   HGB 11.9 (L) 06/11/2023   HCT 36.2 06/11/2023   PLT 224.0 06/11/2023   GLUCOSE 90 06/11/2023   CHOL 133 06/11/2023   TRIG 50.0 06/11/2023   HDL 59.80 06/11/2023   LDLDIRECT 53.0 06/11/2023   LDLCALC 63 06/11/2023   ALT 14 06/11/2023   AST 15 06/11/2023   NA 131 (L) 06/11/2023   K 4.7 06/11/2023   CL 97 06/11/2023   CREATININE 0.62 06/11/2023   BUN 22 06/11/2023   CO2 27 06/11/2023   TSH 2.00 06/11/2023   HGBA1C 6.2 06/11/2023   MICROALBUR <0.7 06/11/2023  MM 3D DIAGNOSTIC MAMMOGRAM BILATERAL BREAST Result Date: 08/06/2023 CLINICAL DATA:  Annual bilateral mammogram. Patient with history of left lumpectomy in August 2024 for left breast cancer and ADH EXAM: DIGITAL DIAGNOSTIC BILATERAL MAMMOGRAM WITH TOMOSYNTHESIS AND CAD TECHNIQUE: Bilateral digital diagnostic mammography and breast tomosynthesis was performed. The images were evaluated with computer-aided detection. COMPARISON:  Previous exam(s). ACR Breast Density Category b: There are scattered areas of fibroglandular density. FINDINGS: Postoperative changes in the left upper medial breast posterior depth. No suspicious findings seen in either breast. IMPRESSION: Benign post treatment changes of the left breast. RECOMMENDATION: Bilateral diagnostic mammogram in 1 year. I have discussed the findings and recommendations with the patient. If applicable, a reminder letter will be sent to the patient regarding the next appointment. BI-RADS CATEGORY  2: Benign. Electronically Signed   By: Rosina Gelineau M.D.   On: 08/06/2023 13:41    Assessment & Plan:  .Bilateral impacted cerumen Assessment & Plan: ADVISED TO RETURN FOR RN VISIT ROBERTA AND USE DEBROX IN EACH EAR    Pure hypercholesterolemia Assessment & Plan: SHE HAS REQUESTED SUSPENSION OF CRESTOR  DUE TO DIFFUSE MYalgias  Orders: -     Lipid panel; Future -     CK; Future  Persistent atrial fibrillation [facility]) Assessment & Plan: She continues  anticoagulation   following cardoversion  and remains in sinus rhtyn based on today's exam . ECHO reviewed. Reminded to continue elquis twice daily .    Vitamin D  deficiency -     VITAMIN D  25 Hydroxy (Vit-D Deficiency, Fractures); Future  Primary hypertension -     Comprehensive metabolic panel with GFR; Future  Hypercoagulable state due to persistent atrial fibrillation (HCC) -     CBC with Differential/Platelet; Future  Split spinal cord malformation [facility]) Assessment & Plan: Resulting in degenerative changes  and mild to moderate  spinal stenosis by last MRI lumbar spine. She  has spondylosis without myelopathy and is able to walk without a walker    Osteoporosis, unspecified osteoporosis type, unspecified pathological fracture presence Assessment & Plan: Managed with  Prolia ; last dose was in  mid January 2025   Myalgia due to statin Assessment & Plan: She has developed diffuse muscle pain since she started taking rosuvastatin  for management of aortic atherosclerosis.  She is requesting a 3 month suspension and will return in 3 months for repeat evaluation with lipids and CK to be obtained prior to visit       Follow-up: Return in about 3 months (around 12/18/2023).   Verneita LITTIE Kettering, MD

## 2023-09-17 NOTE — Assessment & Plan Note (Signed)
 ADVISED TO RETURN FOR RN VISIT ROBERTA AND USE DEBROX IN [facility] EAR

## 2023-09-17 NOTE — Assessment & Plan Note (Signed)
 SHE HAS REQUESTED SUSPENSION OF CRESTOR  DUE TO DIFFUSE MYalgias

## 2023-09-17 NOTE — Assessment & Plan Note (Signed)
 She continues  anticoagulation  following cardoversion  and remains in sinus rhtyn based on today's exam . ECHO reviewed. Reminded to continue elquis twice daily .

## 2023-09-17 NOTE — Patient Instructions (Addendum)
 You can suspend the Crestor  for 3 months to see if your muscle pain resolves .  If the pain does not resolve,  I wuld consider resuming the Crestor  to protect you from having a stroke   Please start using Debrox in both ears, to soften your ear wax, and return for an ear cleaning

## 2023-09-18 DIAGNOSIS — T466X5A Adverse effect of antihyperlipidemic and antiarteriosclerotic drugs, initial encounter: Secondary | ICD-10-CM | POA: Insufficient documentation

## 2023-09-18 NOTE — Assessment & Plan Note (Signed)
 Managed with  Prolia ; last dose was in  mid January 2025

## 2023-09-18 NOTE — Assessment & Plan Note (Addendum)
 Resulting in degenerative changes  and mild to moderate  spinal stenosis by last MRI lumbar spine. She  has spondylosis without myelopathy and is able to walk without a walker

## 2023-09-18 NOTE — Assessment & Plan Note (Signed)
 She has developed diffuse muscle pain since she started taking rosuvastatin  for management of aortic atherosclerosis.  She is requesting a 3 month suspension and will return in 3 months for repeat evaluation with lipids and CK to be obtained prior to visit

## 2023-09-19 ENCOUNTER — Telehealth: Payer: Self-pay | Admitting: *Deleted

## 2023-09-19 NOTE — Telephone Encounter (Signed)
 Pt was scheduled by E2C[facility] for ear irrigation on Monday 8/18 in a 15 minute slot.  Needs to be a 30 min appt. I have also reported this scheduling error

## 2023-09-22 ENCOUNTER — Ambulatory Visit (INDEPENDENT_AMBULATORY_CARE_PROVIDER_SITE_OTHER)

## 2023-09-22 DIAGNOSIS — M9904 Segmental and somatic dysfunction of sacral region: Secondary | ICD-10-CM | POA: Diagnosis not present

## 2023-09-22 DIAGNOSIS — M9903 Segmental and somatic dysfunction of lumbar region: Secondary | ICD-10-CM | POA: Diagnosis not present

## 2023-09-22 DIAGNOSIS — M5451 Vertebrogenic low back pain: Secondary | ICD-10-CM | POA: Diagnosis not present

## 2023-09-22 DIAGNOSIS — H6123 Impacted cerumen, bilateral: Secondary | ICD-10-CM

## 2023-09-22 DIAGNOSIS — M7918 Myalgia, other site: Secondary | ICD-10-CM | POA: Diagnosis not present

## 2023-09-22 NOTE — Telephone Encounter (Signed)
 Also, PCP Not in office on day that this is scheduled which is what is preferred.

## 2023-09-22 NOTE — Progress Notes (Signed)
 Cerumen Impaction Patient presents with Wax Impaction in both ears for the past 5 days. There is a prior history of cerumen impaction. The patient has been using ear drops to loosen wax immediately prior to this visit. The patient denies ear pain.  Before I began I placed a few drops of peroxide in ear and had patient lie down on opposite side. This step was done on the opposite ear as well.    Before I began flushing I checked in each ear so that I can track my progress. I had pt feel the water to see if it was too hot or too cold. Pt verbalized it was okay so I proceeded; periodically asking if patient was okay. Pt did state that it was painful in the left ear when flushing so I stopped and went to the right ear. After completing the right ear pt asked that I attempt the left ear again. Pt stated again that it was painful.  Stopped irrigating the ear and got the provider.   Ear were visualized one last time using otoscope prior to provider taking over.

## 2023-09-23 DIAGNOSIS — M5451 Vertebrogenic low back pain: Secondary | ICD-10-CM | POA: Diagnosis not present

## 2023-09-23 DIAGNOSIS — M9903 Segmental and somatic dysfunction of lumbar region: Secondary | ICD-10-CM | POA: Diagnosis not present

## 2023-09-23 DIAGNOSIS — M9904 Segmental and somatic dysfunction of sacral region: Secondary | ICD-10-CM | POA: Diagnosis not present

## 2023-09-23 DIAGNOSIS — M7918 Myalgia, other site: Secondary | ICD-10-CM | POA: Diagnosis not present

## 2023-09-24 ENCOUNTER — Other Ambulatory Visit: Payer: Self-pay | Admitting: Internal Medicine

## 2023-09-24 ENCOUNTER — Ambulatory Visit

## 2023-09-24 DIAGNOSIS — M9904 Segmental and somatic dysfunction of sacral region: Secondary | ICD-10-CM | POA: Diagnosis not present

## 2023-09-24 DIAGNOSIS — H7291 Unspecified perforation of tympanic membrane, right ear: Secondary | ICD-10-CM

## 2023-09-24 DIAGNOSIS — M7918 Myalgia, other site: Secondary | ICD-10-CM | POA: Diagnosis not present

## 2023-09-24 DIAGNOSIS — M9903 Segmental and somatic dysfunction of lumbar region: Secondary | ICD-10-CM | POA: Diagnosis not present

## 2023-09-24 DIAGNOSIS — M5451 Vertebrogenic low back pain: Secondary | ICD-10-CM | POA: Diagnosis not present

## 2023-09-24 MED ORDER — CIPROFLOXACIN-DEXAMETHASONE 0.3-0.1 % OT SUSP: 4.0000 [drp] | Freq: Two times a day (BID) | OTIC | 0 refills | Status: DC

## 2023-09-25 DIAGNOSIS — H60331 Swimmer's ear, right ear: Secondary | ICD-10-CM | POA: Diagnosis not present

## 2023-09-25 DIAGNOSIS — M47816 Spondylosis without myelopathy or radiculopathy, lumbar region: Secondary | ICD-10-CM | POA: Diagnosis not present

## 2023-09-25 DIAGNOSIS — H903 Sensorineural hearing loss, bilateral: Secondary | ICD-10-CM | POA: Diagnosis not present

## 2023-09-28 ENCOUNTER — Other Ambulatory Visit: Payer: Self-pay | Admitting: Hematology and Oncology

## 2023-09-29 DIAGNOSIS — M9904 Segmental and somatic dysfunction of sacral region: Secondary | ICD-10-CM | POA: Diagnosis not present

## 2023-09-29 DIAGNOSIS — M5451 Vertebrogenic low back pain: Secondary | ICD-10-CM | POA: Diagnosis not present

## 2023-09-29 DIAGNOSIS — M9903 Segmental and somatic dysfunction of lumbar region: Secondary | ICD-10-CM | POA: Diagnosis not present

## 2023-09-29 DIAGNOSIS — M7918 Myalgia, other site: Secondary | ICD-10-CM | POA: Diagnosis not present

## 2023-09-30 DIAGNOSIS — M7918 Myalgia, other site: Secondary | ICD-10-CM | POA: Diagnosis not present

## 2023-09-30 DIAGNOSIS — M9904 Segmental and somatic dysfunction of sacral region: Secondary | ICD-10-CM | POA: Diagnosis not present

## 2023-09-30 DIAGNOSIS — M9903 Segmental and somatic dysfunction of lumbar region: Secondary | ICD-10-CM | POA: Diagnosis not present

## 2023-09-30 DIAGNOSIS — M5451 Vertebrogenic low back pain: Secondary | ICD-10-CM | POA: Diagnosis not present

## 2023-10-01 DIAGNOSIS — M9903 Segmental and somatic dysfunction of lumbar region: Secondary | ICD-10-CM | POA: Diagnosis not present

## 2023-10-01 DIAGNOSIS — M5451 Vertebrogenic low back pain: Secondary | ICD-10-CM | POA: Diagnosis not present

## 2023-10-01 DIAGNOSIS — M7918 Myalgia, other site: Secondary | ICD-10-CM | POA: Diagnosis not present

## 2023-10-01 DIAGNOSIS — M9904 Segmental and somatic dysfunction of sacral region: Secondary | ICD-10-CM | POA: Diagnosis not present

## 2023-10-07 DIAGNOSIS — M9903 Segmental and somatic dysfunction of lumbar region: Secondary | ICD-10-CM | POA: Diagnosis not present

## 2023-10-07 DIAGNOSIS — M5451 Vertebrogenic low back pain: Secondary | ICD-10-CM | POA: Diagnosis not present

## 2023-10-07 DIAGNOSIS — M9904 Segmental and somatic dysfunction of sacral region: Secondary | ICD-10-CM | POA: Diagnosis not present

## 2023-10-07 DIAGNOSIS — M7918 Myalgia, other site: Secondary | ICD-10-CM | POA: Diagnosis not present

## 2023-10-08 DIAGNOSIS — M9904 Segmental and somatic dysfunction of sacral region: Secondary | ICD-10-CM | POA: Diagnosis not present

## 2023-10-08 DIAGNOSIS — M9903 Segmental and somatic dysfunction of lumbar region: Secondary | ICD-10-CM | POA: Diagnosis not present

## 2023-10-08 DIAGNOSIS — M5451 Vertebrogenic low back pain: Secondary | ICD-10-CM | POA: Diagnosis not present

## 2023-10-08 DIAGNOSIS — M7918 Myalgia, other site: Secondary | ICD-10-CM | POA: Diagnosis not present

## 2023-10-09 DIAGNOSIS — M9903 Segmental and somatic dysfunction of lumbar region: Secondary | ICD-10-CM | POA: Diagnosis not present

## 2023-10-09 DIAGNOSIS — M5451 Vertebrogenic low back pain: Secondary | ICD-10-CM | POA: Diagnosis not present

## 2023-10-09 DIAGNOSIS — M7918 Myalgia, other site: Secondary | ICD-10-CM | POA: Diagnosis not present

## 2023-10-09 DIAGNOSIS — M9904 Segmental and somatic dysfunction of sacral region: Secondary | ICD-10-CM | POA: Diagnosis not present

## 2023-10-10 DIAGNOSIS — H60331 Swimmer's ear, right ear: Secondary | ICD-10-CM | POA: Diagnosis not present

## 2023-10-13 DIAGNOSIS — M7918 Myalgia, other site: Secondary | ICD-10-CM | POA: Diagnosis not present

## 2023-10-13 DIAGNOSIS — M533 Sacrococcygeal disorders, not elsewhere classified: Secondary | ICD-10-CM | POA: Diagnosis not present

## 2023-10-13 DIAGNOSIS — M9904 Segmental and somatic dysfunction of sacral region: Secondary | ICD-10-CM | POA: Diagnosis not present

## 2023-10-13 DIAGNOSIS — M9903 Segmental and somatic dysfunction of lumbar region: Secondary | ICD-10-CM | POA: Diagnosis not present

## 2023-10-13 DIAGNOSIS — M5451 Vertebrogenic low back pain: Secondary | ICD-10-CM | POA: Diagnosis not present

## 2023-10-14 DIAGNOSIS — M9904 Segmental and somatic dysfunction of sacral region: Secondary | ICD-10-CM | POA: Diagnosis not present

## 2023-10-14 DIAGNOSIS — M5451 Vertebrogenic low back pain: Secondary | ICD-10-CM | POA: Diagnosis not present

## 2023-10-14 DIAGNOSIS — M7918 Myalgia, other site: Secondary | ICD-10-CM | POA: Diagnosis not present

## 2023-10-14 DIAGNOSIS — M9903 Segmental and somatic dysfunction of lumbar region: Secondary | ICD-10-CM | POA: Diagnosis not present

## 2023-10-15 ENCOUNTER — Inpatient Hospital Stay: Attending: Hematology and Oncology | Admitting: Hematology and Oncology

## 2023-10-15 DIAGNOSIS — C50212 Malignant neoplasm of upper-inner quadrant of left female breast: Secondary | ICD-10-CM | POA: Diagnosis not present

## 2023-10-15 DIAGNOSIS — Z170 Estrogen receptor positive status [ER+]: Secondary | ICD-10-CM | POA: Diagnosis not present

## 2023-10-15 DIAGNOSIS — Z79811 Long term (current) use of aromatase inhibitors: Secondary | ICD-10-CM | POA: Diagnosis not present

## 2023-10-15 DIAGNOSIS — M791 Myalgia, unspecified site: Secondary | ICD-10-CM | POA: Diagnosis not present

## 2023-10-15 DIAGNOSIS — M7918 Myalgia, other site: Secondary | ICD-10-CM | POA: Diagnosis not present

## 2023-10-15 DIAGNOSIS — M9904 Segmental and somatic dysfunction of sacral region: Secondary | ICD-10-CM | POA: Diagnosis not present

## 2023-10-15 DIAGNOSIS — M9903 Segmental and somatic dysfunction of lumbar region: Secondary | ICD-10-CM | POA: Diagnosis not present

## 2023-10-15 DIAGNOSIS — M5451 Vertebrogenic low back pain: Secondary | ICD-10-CM | POA: Diagnosis not present

## 2023-10-15 NOTE — Progress Notes (Signed)
 HEMATOLOGY-ONCOLOGY TELEPHONE VISIT PROGRESS NOTE  I connected with our patient on 10/15/23 at 11:30 AM EDT by telephone and verified that I am speaking with the correct person using two identifiers.  I discussed the limitations, risks, security and privacy concerns of performing an evaluation and management service by telephone and the availability of in person appointments.  I also discussed with the patient that there may be a patient responsible charge related to this service. The patient expressed understanding and agreed to proceed.   History of Present Illness: Follow-up to discuss treatment plan  History of Present Illness Lacey Brown is an 83 year old female who presents with muscle aches and pains.  She discontinued anastrozole  therapy but did not notice any improvement.  Her primary care physician stopped her Crestor  and she immediately felt an improvement.      Oncology History  Malignant neoplasm of upper-inner quadrant of left breast in female, estrogen receptor positive (HCC)  08/30/2022 Initial Diagnosis   Screening mammogram detected left breast mass 6 mm by ultrasound.  Biopsy UIQ: ADH involving small intraductal papilloma/radial sclerosing lesion.  Mass upper inner quadrant: Grade 2 IDC with mucinous features with DCIS low-grade, ER 100%, PR 100%, Ki-67 2%, HER2 1+ negative   09/03/2022 Cancer Staging   Staging form: Breast, AJCC 8th Edition - Clinical stage from 09/03/2022: Stage IA (cT1b, cN0, cM0, G2, ER+, PR+, HER2-) - Signed by [name], MD on 09/11/2022 Stage prefix: Initial diagnosis Method of lymph node assessment: Clinical Histologic grading system: 3 grade system   09/19/2022 Surgery   Left lumpectomy: Grade 2 IDC with extracellular mucin 0.7 cm with DCIS intermediate grade, margins negative, for vascular invasion not identified, lateral margin: DCIS involving intraductal papilloma, all margins negative, ER 100%, PR 100%, HER2 1+, Ki-67 2%    09/19/2022  Cancer Staging   Staging form: Breast, AJCC 8th Edition - Pathologic stage from 09/19/2022: Stage IA (pT1b, pN0, cM0, G2, ER+, PR+, HER2-) - Signed by Crawford Morna Pickle, NP on 01/07/2023 Stage prefix: Initial diagnosis Histologic grading system: 3 grade system   10/2022 -  Anti-estrogen oral therapy   1 mg Anastrozole  x 5 years     REVIEW OF SYSTEMS:   Constitutional: Denies fevers, chills or abnormal weight loss All other systems were reviewed with the patient and are negative. Observations/Objective:     Assessment Plan:  Malignant neoplasm of upper-inner quadrant of left breast in female, estrogen receptor positive (HCC) 08/30/2022:Screening mammogram detected left breast mass 6 mm by ultrasound.  Biopsy UIQ: ADH involving small intraductal papilloma/radial sclerosing lesion.  Mass upper inner quadrant: Grade 2 IDC with mucinous features with DCIS low-grade, ER 100%, PR 100%, Ki-67 2%, HER2 1+ negative    09/19/2022: Left lumpectomy: Grade 2 IDC with extracellular mucin 0.7 cm with DCIS intermediate grade, margins negative, for vascular invasion not identified, lateral margin: DCIS involving intraductal papilloma, all margins negative, ER 100%, PR 100%, HER2 1+, Ki-67 2%   Treatment plan: Antiestrogen therapy with anastrozole  1 mg daily x 5 years started 10/09/2022 stopped 07/17/2023   Anastrozole  toxicities: Muscle aches and pains (were felt to be related to rosuvastatin  and not anastrozole ).  Therefore she will now resume anastrozole  therapy.  I discussed with her that even if she cannot take it for 5 years I would be happy if she can take it for 2-1/2 years.  Return to clinic in 1 year for follow-up    --------------------------------- Assessment and Plan Assessment & Plan Estrogen receptor positive  malignant neoplasm of upper-inner quadrant of left breast Muscle aches attributed to rosuvastatin , resolved after discontinuation. Anastrozole  essential for recurrence  prevention. - Restart anastrozole  1 mg oral daily. - Schedule follow-up in one year.      I discussed the assessment and treatment plan with the patient. The patient was provided an opportunity to ask questions and all were answered. The patient agreed with the plan and demonstrated an understanding of the instructions. The patient was advised to call back or seek an in-person evaluation if the symptoms worsen or if the condition fails to improve as anticipated.   I provided 20 minutes of non-face-to-face time during this encounter.  This includes time for charting and coordination of care   Naomi MARLA Chad, MD

## 2023-10-15 NOTE — Assessment & Plan Note (Signed)
 08/30/2022:Screening mammogram detected left breast mass 6 mm by ultrasound.  Biopsy UIQ: ADH involving small intraductal papilloma/radial sclerosing lesion.  Mass upper inner quadrant: Grade 2 IDC with mucinous features with DCIS low-grade, ER 100%, PR 100%, Ki-67 2%, HER2 1+ negative    09/19/2022: Left lumpectomy: Grade 2 IDC with extracellular mucin 0.7 cm with DCIS intermediate grade, margins negative, for vascular invasion not identified, lateral margin: DCIS involving intraductal papilloma, all margins negative, ER 100%, PR 100%, HER2 1+, Ki-67 2%   Treatment plan: Antiestrogen therapy with anastrozole  1 mg daily x 5 years started 10/09/2022 stopped 07/17/2023   Anastrozole  toxicities: Muscle aches and pains Recommended stopping Anastrozole  for 3 months to definitively determine if her symptoms are related to anastrozole 

## 2023-10-22 ENCOUNTER — Telehealth: Payer: Self-pay

## 2023-10-22 NOTE — Telephone Encounter (Signed)
 Spoke with pt and she stated that she has tested positive for covid. Pt stated that she on Sunday she woke up with a headache and through out the day the headache got worse then she started with a runny nose and feeling foggy headed. Pt stated that other than that she does not feel bad at all. Pt stated that she has been taking tylenol . Pt was advised that she needs to continue treating her symptoms with OTC medications if needed and to quarantine for 5 days from her symptoms on set. Pt gave a verbal understanding.

## 2023-10-22 NOTE — Telephone Encounter (Signed)
 noted

## 2023-10-22 NOTE — Telephone Encounter (Signed)
 Copied from CRM [id number]. Topic: Clinical - Medical Advice >> Oct 22, 2023 11:34 AM Laymon HERO wrote: Reason for CRM: patient asking for a nurse or Dr Marylynn to call her as soon as possible. She did not want to discuss anything or the reason for the call.

## 2023-10-22 NOTE — Telephone Encounter (Unsigned)
 Copied from CRM [id number]. Topic: Clinical - Medical Advice >> Oct 22, 2023 11:34 AM Laymon HERO wrote: Reason for CRM: patient asking for a nurse or Dr Marylynn to call her as soon as possible. She did not want to discuss anything or the reason for the call. >> Oct 22, 2023  3:06 PM Robinson H wrote: Patient following up on message sent to clinic earlier. Advised patient office has by end of day to reach out  [place] [phone]

## 2023-11-02 ENCOUNTER — Other Ambulatory Visit: Payer: Self-pay | Admitting: Internal Medicine

## 2023-12-01 DIAGNOSIS — H60331 Swimmer's ear, right ear: Secondary | ICD-10-CM | POA: Diagnosis not present

## 2023-12-04 ENCOUNTER — Other Ambulatory Visit: Payer: Self-pay | Admitting: Internal Medicine

## 2023-12-05 NOTE — Telephone Encounter (Unsigned)
 Copied from CRM [id number]. Topic: Clinical - Prescription Issue >> Dec 05, 2023 11:35 AM Carlyon D wrote: Reason for CRM: Pt is calling in regards to her medication Telmisartan  80 mg Oral Daily  and is out I told pt medication is pending per pcp,  pt is out of her meds and would like them sent over to pharmacy.

## 2023-12-06 ENCOUNTER — Other Ambulatory Visit: Payer: Self-pay | Admitting: Internal Medicine

## 2023-12-06 DIAGNOSIS — K2900 Acute gastritis without bleeding: Secondary | ICD-10-CM

## 2023-12-09 ENCOUNTER — Other Ambulatory Visit (HOSPITAL_COMMUNITY): Payer: Self-pay | Admitting: Internal Medicine

## 2023-12-09 DIAGNOSIS — I4819 Other persistent atrial fibrillation: Secondary | ICD-10-CM

## 2023-12-09 NOTE — Telephone Encounter (Signed)
 Eliquis  5mg  refill request received. Patient is 83 years old, weight-69.9kg, Crea-0.62 on 06/11/23, Diagnosis-Afib, and last seen by Dr. Jeffrie on 02/18/23. Dose is appropriate based on dosing criteria. Will send in refill to requested pharmacy.

## 2023-12-22 ENCOUNTER — Other Ambulatory Visit (INDEPENDENT_AMBULATORY_CARE_PROVIDER_SITE_OTHER)

## 2023-12-22 ENCOUNTER — Ambulatory Visit (INDEPENDENT_AMBULATORY_CARE_PROVIDER_SITE_OTHER): Payer: Medicare HMO | Admitting: *Deleted

## 2023-12-22 VITALS — Ht 60.0 in | Wt 150.0 lb

## 2023-12-22 DIAGNOSIS — D6869 Other thrombophilia: Secondary | ICD-10-CM

## 2023-12-22 DIAGNOSIS — I10 Essential (primary) hypertension: Secondary | ICD-10-CM

## 2023-12-22 DIAGNOSIS — Z0000 Encounter for general adult medical examination without abnormal findings: Secondary | ICD-10-CM | POA: Diagnosis not present

## 2023-12-22 DIAGNOSIS — E7800 Pure hypercholesterolemia, unspecified: Secondary | ICD-10-CM | POA: Diagnosis not present

## 2023-12-22 DIAGNOSIS — I4819 Other persistent atrial fibrillation: Secondary | ICD-10-CM

## 2023-12-22 DIAGNOSIS — E559 Vitamin D deficiency, unspecified: Secondary | ICD-10-CM | POA: Diagnosis not present

## 2023-12-22 LAB — COMPREHENSIVE METABOLIC PANEL WITH GFR
ALT: 12 U/L (ref 0–35)
AST: 14 U/L (ref 0–37)
Albumin: 4.2 g/dL (ref 3.5–5.2)
Alkaline Phosphatase: 30 U/L — ABNORMAL LOW (ref 39–117)
BUN: 17 mg/dL (ref 6–23)
CO2: 28 meq/L (ref 19–32)
Calcium: 8.9 mg/dL (ref 8.4–10.5)
Chloride: 98 meq/L (ref 96–112)
Creatinine, Ser: 0.53 mg/dL (ref 0.40–1.20)
GFR: 84.73 mL/min (ref >60.00)
Glucose, Bld: 94 mg/dL (ref 70–99)
Potassium: 4.8 meq/L (ref 3.5–5.1)
Sodium: 133 meq/L — ABNORMAL LOW (ref 135–145)
Total Bilirubin: 0.5 mg/dL (ref 0.2–1.2)
Total Protein: 6.2 g/dL (ref 6.0–8.3)

## 2023-12-22 LAB — CBC WITH DIFFERENTIAL/PLATELET
Basophils Absolute: 0 K/uL (ref 0.0–0.1)
Basophils Relative: 0.7 % (ref 0.0–3.0)
Eosinophils Absolute: 0.2 K/uL (ref 0.0–0.7)
Eosinophils Relative: 2.9 % (ref 0.0–5.0)
HCT: 37.3 % (ref 36.0–46.0)
Hemoglobin: 12.3 g/dL (ref 12.0–15.0)
Lymphocytes Relative: 20.3 % (ref 12.0–46.0)
Lymphs Abs: 1.2 K/uL (ref 0.7–4.0)
MCHC: 33.1 g/dL (ref 30.0–36.0)
MCV: 91.1 fl (ref 78.0–100.0)
Monocytes Absolute: 0.7 K/uL (ref 0.1–1.0)
Monocytes Relative: 12 % (ref 3.0–12.0)
Neutro Abs: 3.9 K/uL (ref 1.4–7.7)
Neutrophils Relative %: 64.1 % (ref 43.0–77.0)
Platelets: 221 K/uL (ref 150.0–400.0)
RBC: 4.09 Mil/uL (ref 3.87–5.11)
RDW: 14 % (ref 11.5–15.5)
WBC: 6.1 K/uL (ref 4.0–10.5)

## 2023-12-22 LAB — CK: Total CK: 79 U/L (ref 17–177)

## 2023-12-22 LAB — VITAMIN D 25 HYDROXY (VIT D DEFICIENCY, FRACTURES): VITD: 26.07 ng/mL — ABNORMAL LOW (ref 30.00–100.00)

## 2023-12-22 LAB — LIPID PANEL
Cholesterol: 178 mg/dL (ref 0–200)
HDL: 61.7 mg/dL (ref >39.00)
LDL Cholesterol: 105 mg/dL — ABNORMAL HIGH (ref 0–99)
NonHDL: 116.12
Total CHOL/HDL Ratio: 3
Triglycerides: 55 mg/dL (ref 0.0–149.0)
VLDL: 11 mg/dL (ref 0.0–40.0)

## 2023-12-22 NOTE — Progress Notes (Signed)
 Chief Complaint  Patient presents with   Medicare Wellness     Subjective:   Lacey Brown is a 83 y.o. female who presents for a Medicare Annual Wellness Visit.  Allergies (verified) Ace inhibitors, Carvedilol , Codeine, and Kenalog  [triamcinolone  acetonide]   History: Past Medical History:  Diagnosis Date   Arthritis    back, neck; right shoulder;    Asthma    using advair prn   Cancer (HCC) 2024   Left Breast Cancer   Chicken pox    Dyspnea    Dysrhythmia 09/13/2022   Atrial Fibrillation   Hyperlipidemia    Hypertension    controlled with medication;    Peripheral vascular disease    mild dilation of aorta   Past Surgical History:  Procedure Laterality Date   BREAST BIOPSY Left 08/12/2022   US  LT BREAST BX W LOC DEV 1ST LESION IMG BX SPEC US  GUIDE 08/12/2022 [facility]   BREAST BIOPSY Left 08/30/2022   MM LT BREAST BX W LOC DEV EA AD LESION IMG BX SPEC STEREO GUIDE 08/30/2022 [facility]   BREAST BIOPSY Left 08/30/2022   MM LT BREAST BX W LOC DEV 1ST LESION IMAGE BX SPEC STEREO GUIDE 08/30/2022 [facility]   BREAST BIOPSY  09/18/2022   MM LT RADIOACTIVE SEED EA ADD LESION LOC MAMMO GUIDE 09/18/2022 [facility]   BREAST BIOPSY  09/18/2022   MM LT RADIOACTIVE SEED LOC MAMMO GUIDE 09/18/2022 [facility]   BREAST EXCISIONAL BIOPSY Left    BREAST LUMPECTOMY WITH RADIOACTIVE SEED LOCALIZATION Left 09/19/2022   Procedure: LEFT BREAST SEED BRACKETED LUMPECTOMY;  Surgeon: Aron Shoulders, MD;  Location: [facility];  Service: General;  Laterality: Left;   CARDIOVERSION N/A 10/23/2022   Procedure: CARDIOVERSION;  Surgeon: Delford Maude BROCKS, MD;  Location: [facility];  Service: Cardiovascular;  Laterality: N/A;   COLONOSCOPY  2014   FOOT SURGERY Right 1973   cyst removed off right foot   TONSILLECTOMY AND ADENOIDECTOMY  1953   Family History  Problem Relation Age of Onset   Heart disease Mother    Heart disease Father    Arthritis  Maternal Grandmother    Diabetes Neg Hx    Cancer Neg Hx    Stroke Neg Hx    Social History   Occupational History   Not on file  Tobacco Use   Smoking status: Former    Current packs/day: 0.00    Types: Cigarettes    Quit date: 04/17/2009    Years since quitting: 14.6   Smokeless tobacco: Never   Tobacco comments:    Former smoker 09/26/22  Vaping Use   Vaping status: Never Used  Substance and Sexual Activity   Alcohol use: Yes    Alcohol/week: 0.0 standard drinks of alcohol    Comment: occasional   Drug use: No   Sexual activity: Never   Tobacco Counseling Counseling given: Not Answered Tobacco comments: Former smoker 09/26/22  SDOH Screenings   Food Insecurity: No Food Insecurity (12/22/2023)  Housing: Low Risk  (12/22/2023)  Transportation Needs: No Transportation Needs (12/22/2023)  Utilities: Not At Risk (12/22/2023)  Alcohol Screen: Low Risk  (12/22/2023)  Depression (PHQ2-9): Low Risk  (12/22/2023)  Financial Resource Strain: Low Risk  (12/22/2023)  Physical Activity: Inactive (12/22/2023)  Social Connections: Moderately Integrated (12/22/2023)  Stress: No Stress Concern Present (12/22/2023)  Tobacco Use: Medium Risk (12/22/2023)  Health Literacy: Adequate Health Literacy (12/22/2023)   See flowsheets for full screening details  Depression Screen  PHQ 2 & 9 Depression Scale- Over the past 2 weeks, how often have you been bothered by any of the following problems? Little interest or pleasure in doing things: 0 Feeling down, depressed, or hopeless (PHQ Adolescent also includes...irritable): 0 PHQ-2 Total Score: 0 Trouble falling or staying asleep, or sleeping too much: 0 Feeling tired or having little energy: 0 Poor appetite or overeating (PHQ Adolescent also includes...weight loss): 0 Feeling bad about yourself - or that you are a failure or have let yourself or your family down: 0 Trouble concentrating on things, such as reading the newspaper or watching  television (PHQ Adolescent also includes...like school work): 0 Moving or speaking so slowly that other people could have noticed. Or the opposite - being so fidgety or restless that you have been moving around a lot more than usual: 0 Thoughts that you would be better off dead, or of hurting yourself in some way: 0 PHQ-9 Total Score: 0 If you checked off any problems, how difficult have these problems made it for you to do your work, take care of things at home, or get along with other people?: Not difficult at all     Goals Addressed             This Visit's Progress    Patient Stated       Wants to get rid of arthritis pain       Visit info / Clinical Intake: Medicare Wellness Visit Type:: Subsequent Annual Wellness Visit Persons participating in visit:: patient Medicare Wellness Visit Mode:: Telephone If telephone:: video declined Because this visit was a virtual/telehealth visit:: pt reported vitals If Telephone or Video please confirm:: I connected with the patient using audio enabled telemedicine application and verified that I am speaking with the correct person using two identifiers; I discussed the limitations of evaluation and management by telemedicine; The patient expressed understanding and agreed to proceed Patient Location:: Home Provider Location:: Office/Clinic Information given by:: patient Interpreter Needed?: No Pre-visit prep was completed: yes AWV questionnaire completed by patient prior to visit?: no Living arrangements:: (!) lives alone Patient's Overall Health Status Rating: good Typical amount of pain: some Does pain affect daily life?: (!) yes Are you currently prescribed opioids?: no  Dietary Habits and Nutritional Risks How many meals a day?: 3 Eats fruit and vegetables daily?: yes Most meals are obtained by: preparing own meals In the last 2 weeks, have you had any of the following?: none Diabetic:: no  Functional Status Activities of  Daily Living (to include ambulation/medication): Independent Ambulation: Independent Medication Administration: Independent Home Management: Independent Manage your own finances?: yes Primary transportation is: driving Concerns about vision?: no *vision screening is required for WTM* Concerns about hearing?: no  Fall Screening Falls in the past year?: 0 Number of falls in past year: 0 Was there an injury with Fall?: 0 Fall Risk Category Calculator: 0 Patient Fall Risk Level: Low Fall Risk  Fall Risk Patient at Risk for Falls Due to: No Fall Risks Fall risk Follow up: Falls evaluation completed; Falls prevention discussed  Home and Transportation Safety: All rugs have non-skid backing?: yes All stairs or steps have railings?: N/A, no stairs Grab bars in the bathtub or shower?: yes Have non-skid surface in bathtub or shower?: (!) no Good home lighting?: yes Regular seat belt use?: yes Hospital stays in the last year:: no  Cognitive Assessment Difficulty concentrating, remembering, or making decisions? : no Will 6CIT or Mini Cog be Completed: yes What year  is it?: 0 points What month is it?: 0 points Give patient an address phrase to remember (5 components): [address] [place] About what time is it?: 0 points Count backwards from 20 to 1: 0 points Say the months of the year in reverse: 0 points Repeat the address phrase from earlier: 0 points 6 CIT Score: 0 points  Advance Directives (For Healthcare) Does Patient Have a Medical Advance Directive?: No Would patient like information on creating a medical advance directive?: No - Patient declined  Reviewed/Updated  Reviewed/Updated: Reviewed All (Medical, Surgical, Family, Medications, Allergies, Care Teams, Patient Goals)        Objective:    Today's Vitals   12/22/23 1304  Weight: 150 lb (68 kg)  Height: 5' (1.524 m)   Body mass index is 29.29 kg/m.  Current Medications (verified) Outpatient  Encounter Medications as of 12/22/2023  Medication Sig   acetaminophen  (TYLENOL ) 500 MG tablet Take 500 mg by mouth every 6 (six) hours as needed.   acetaminophen  (TYLENOL ) 650 MG CR tablet Take 2 tablets by mouth 2 (two) times daily.   albuterol  (VENTOLIN  HFA) 108 (90 Base) MCG/ACT inhaler Inhale 2 puffs into the lungs every 6 (six) hours as needed for wheezing or shortness of breath.   amLODipine  (NORVASC ) 2.5 MG tablet Take 1 tablet (2.5 mg total) by mouth daily.   anastrozole  (ARIMIDEX ) 1 MG tablet TAKE 1 TABLET BY MOUTH EVERY DAY   apixaban  (ELIQUIS ) 5 MG TABS tablet TAKE 1 TABLET BY MOUTH TWICE A DAY   denosumab  (PROLIA ) 60 MG/ML SOSY injection Inject 60 mg into the skin every 6 (six) months.   metoprolol  succinate (TOPROL -XL) 25 MG 24 hr tablet TAKE 1 TABLET (25 MG TOTAL) BY MOUTH DAILY.   omeprazole  (PRILOSEC  OTC) 20 MG tablet Take 1 tablet (20 mg total) by mouth daily. In the morning on an empty stomach (Patient taking differently: Take 20 mg by mouth daily as needed. In the morning on an empty stomach)   telmisartan  (MICARDIS ) 80 MG tablet TAKE 1 TABLET BY MOUTH EVERY DAY   trolamine salicylate (ASPERCREME) 10 % cream Apply 1 Application topically as needed for muscle pain.   WIXELA INHUB 250-50 MCG/ACT AEPB INHALE 1 PUFF INTO THE LUNGS IN THE MORNING AND AT BEDTIME.   ciprofloxacin -dexamethasone  (CIPRODEX ) OTIC suspension Place 4 drops into the right ear 2 (two) times daily. (Patient not taking: Reported on 12/22/2023)   rosuvastatin  (CRESTOR ) 10 MG tablet Take 1 tablet (10 mg total) by mouth daily. (Patient not taking: Reported on 12/22/2023)   Facility-Administered Encounter Medications as of 12/22/2023  Medication   [START ON 02/21/2024] denosumab  (PROLIA ) injection 60 mg   Hearing/Vision screen Hearing Screening - Comments:: No issues Vision Screening - Comments:: No correction, Dr. Carolee, up to date Immunizations and Health Maintenance Health Maintenance  Topic Date Due    COVID-19 Vaccine (5 - 2025-26 season) 10/06/2023   Mammogram  08/05/2024   Medicare Annual Wellness (AWV)  12/21/2024   DTaP/Tdap/Td (2 - Td or Tdap) 02/03/2033   Pneumococcal Vaccine: 50+ Years  Completed   Influenza Vaccine  Completed   DEXA SCAN  Completed   Zoster Vaccines- Shingrix  Completed   Meningococcal B Vaccine  Aged Out        Assessment/Plan:  This is a routine wellness examination for Lacey Brown.  Patient Care Team: [name], MD as PCP - General (Internal Medicine) [name], MD as PCP - Cardiology (Cardiology) [name], MD as Consulting  Physician (Hematology and Oncology) Aron Shoulders, MD as Consulting Physician (General Surgery)  I have personally reviewed and noted the following in the patient's chart:   Medical and social history Use of alcohol, tobacco or illicit drugs  Current medications and supplements including opioid prescriptions. Functional ability and status Nutritional status Physical activity Advanced directives List of other physicians Hospitalizations, surgeries, and ER visits in previous 12 months Vitals Screenings to include cognitive, depression, and falls Referrals and appointments  No orders of the defined types were placed in this encounter.  In addition, I have reviewed and discussed with patient certain preventive protocols, quality metrics, and best practice recommendations. A written personalized care plan for preventive services as well as general preventive health recommendations were provided to patient.   Angeline Fredericks, LPN   [id number]   Return in 1 year (on 12/21/2024).  After Visit Summary: (Declined) Due to this being a telephonic visit, with patients personalized plan was offered to patient but patient Declined AVS at this time   Nurse Notes: Discussed the need to update covid vaccine.

## 2023-12-22 NOTE — Patient Instructions (Signed)
  [name],  Thank you for taking the time for your Medicare Wellness Visit. I appreciate your continued commitment to your health goals. Please review the care plan we discussed, and feel free to reach out if I can assist you further.  Please note that Annual Wellness Visits do not include a physical exam. Some assessments may be limited, especially if the visit was conducted virtually. If needed, we may recommend an in-person follow-up with your provider.  Ongoing Care Seeing your primary care provider every 3 to 6 months helps us  monitor your health and provide consistent, personalized care.  Consider updating your covid vaccine.  Referrals If a referral was made during today's visit and you haven't received any updates within two weeks, please contact the referred provider directly to check on the status.  Recommended Screenings:  Health Maintenance  Topic Date Due   COVID-19 Vaccine (5 - 2025-26 season) 10/06/2023   Breast Cancer Screening  08/05/2024   Medicare Annual Wellness Visit  12/21/2024   DTaP/Tdap/Td vaccine (2 - Td or Tdap) 02/03/2033   Pneumococcal Vaccine for age over 63  Completed   Flu Shot  Completed   DEXA scan (bone density measurement)  Completed   Zoster (Shingles) Vaccine  Completed   Meningitis B Vaccine  Aged Out       12/22/2023    1:10 PM  Advanced Directives  Does Patient Have a Medical Advance Directive? No    Vision: Annual vision screenings are recommended for early detection of glaucoma, cataracts, and diabetic retinopathy. These exams can also reveal signs of chronic conditions such as diabetes and high blood pressure.  Dental: Annual dental screenings help detect early signs of oral cancer, gum disease, and other conditions linked to overall health, including heart disease and diabetes.  Please see the attached documents for additional preventive care recommendations.   Consider updating your covid vaccine.

## 2023-12-24 ENCOUNTER — Ambulatory Visit (INDEPENDENT_AMBULATORY_CARE_PROVIDER_SITE_OTHER): Admitting: Internal Medicine

## 2023-12-24 ENCOUNTER — Ambulatory Visit: Payer: Self-pay | Admitting: Internal Medicine

## 2023-12-24 ENCOUNTER — Ambulatory Visit: Admitting: Orthopaedic Surgery

## 2023-12-24 ENCOUNTER — Encounter: Payer: Self-pay | Admitting: Internal Medicine

## 2023-12-24 VITALS — BP 122/60 | HR 89 | Ht 60.0 in | Wt 151.8 lb

## 2023-12-24 DIAGNOSIS — T466X5A Adverse effect of antihyperlipidemic and antiarteriosclerotic drugs, initial encounter: Secondary | ICD-10-CM | POA: Diagnosis not present

## 2023-12-24 DIAGNOSIS — M1990 Unspecified osteoarthritis, unspecified site: Secondary | ICD-10-CM

## 2023-12-24 DIAGNOSIS — M791 Myalgia, unspecified site: Secondary | ICD-10-CM | POA: Diagnosis not present

## 2023-12-24 DIAGNOSIS — R600 Localized edema: Secondary | ICD-10-CM | POA: Diagnosis not present

## 2023-12-24 DIAGNOSIS — E7800 Pure hypercholesterolemia, unspecified: Secondary | ICD-10-CM

## 2023-12-24 DIAGNOSIS — M170 Bilateral primary osteoarthritis of knee: Secondary | ICD-10-CM

## 2023-12-24 DIAGNOSIS — E559 Vitamin D deficiency, unspecified: Secondary | ICD-10-CM | POA: Diagnosis not present

## 2023-12-24 DIAGNOSIS — Z7902 Long term (current) use of antithrombotics/antiplatelets: Secondary | ICD-10-CM

## 2023-12-24 DIAGNOSIS — E871 Hypo-osmolality and hyponatremia: Secondary | ICD-10-CM

## 2023-12-24 DIAGNOSIS — I4819 Other persistent atrial fibrillation: Secondary | ICD-10-CM | POA: Diagnosis not present

## 2023-12-24 DIAGNOSIS — I10 Essential (primary) hypertension: Secondary | ICD-10-CM | POA: Diagnosis not present

## 2023-12-24 MED ORDER — AMLODIPINE BESYLATE 2.5 MG PO TABS: 2.5000 mg | ORAL_TABLET | Freq: Every day | ORAL | 1 refills | Status: AC

## 2023-12-24 NOTE — Patient Instructions (Addendum)
 Your CBC,  muscle enzyme,  cholesterol,  liver and kidney function have been reviewed and there are no significant or worrisome findings . Please continue your current medications and  plan to repeat  Non fasting labs in 6 months.   You do not have diabetes.  Your blood pressure is well controlled .   See you in 6 months

## 2023-12-24 NOTE — Progress Notes (Signed)
 Subjective:  Patient ID: Lacey Brown, female    DOB: 1940/06/09  Age: 83 y.o. MRN: [id number]  CC: The primary encounter diagnosis was Edema of left lower extremity. Diagnoses of Persistent atrial fibrillation (HCC), Vitamin D  deficiency, Chronic hyponatremia, Primary hypertension, Long term (current) use of antithrombotics/antiplatelets, Osteoarthritis of both knees, unspecified osteoarthritis type, Arthritis, Pure hypercholesterolemia, and Myalgia due to statin were also pertinent to this visit.   HPI Lacey Brown presents for  Chief Complaint  Patient presents with   Medical Management of Chronic Issues    3 month follow up    1) HTN:  Hypertension: patient does not check blood pressure at home.  . Patient is following a reduced salt diet most days and is taking medications as prescribed . BP is always  elevated when she sees her [place]  provider for management of back pain   2) chronic atrial fibrillation:  taking Eliquis   and metoprolol .  Denies symptoms of palpitations,  dyspnea   3) chronic low back pain  bothering her; I'm due for an ESI gets one (every 3 or 4 months) in December   4) left shoulder pain : she receives periodic I/a  sterid injections by [facility] .   5)  Left knee arthritis. Her pain keeps her from being active outside walking.  Walking constantly inside her house or stands.    6) COPD:  no recent use of albuterol  MDI .  Denies cough, dyspnea  and wheezing but lifestyle is sedentary.  using Wixela  2 times daily and gargles with salt.     Outpatient Medications Prior to Visit  Medication Sig Dispense Refill   acetaminophen  (TYLENOL ) 500 MG tablet Take 500 mg by mouth every 6 (six) hours as needed.     acetaminophen  (TYLENOL ) 650 MG CR tablet Take 2 tablets by mouth 2 (two) times daily.     albuterol  (VENTOLIN  HFA) 108 (90 Base) MCG/ACT inhaler Inhale 2 puffs into the lungs every 6 (six) hours as needed for wheezing or shortness of  breath. 3.7 g 11   anastrozole  (ARIMIDEX ) 1 MG tablet TAKE 1 TABLET BY MOUTH EVERY DAY 90 tablet 3   apixaban  (ELIQUIS ) 5 MG TABS tablet TAKE 1 TABLET BY MOUTH TWICE A DAY 180 tablet 1   denosumab  (PROLIA ) 60 MG/ML SOSY injection Inject 60 mg into the skin every 6 (six) months.     metoprolol  succinate (TOPROL -XL) 25 MG 24 hr tablet TAKE 1 TABLET (25 MG TOTAL) BY MOUTH DAILY. 90 tablet 1   omeprazole  (PRILOSEC  OTC) 20 MG tablet Take 1 tablet (20 mg total) by mouth daily. In the morning on an empty stomach (Patient taking differently: Take 20 mg by mouth daily as needed. In the morning on an empty stomach) 90 tablet 1   telmisartan  (MICARDIS ) 80 MG tablet TAKE 1 TABLET BY MOUTH EVERY DAY 90 tablet 1   trolamine salicylate (ASPERCREME) 10 % cream Apply 1 Application topically as needed for muscle pain.     WIXELA INHUB 250-50 MCG/ACT AEPB INHALE 1 PUFF INTO THE LUNGS IN THE MORNING AND AT BEDTIME. 60 each 1   amLODipine  (NORVASC ) 2.5 MG tablet Take 1 tablet (2.5 mg total) by mouth daily. 90 tablet 1   ciprofloxacin -dexamethasone  (CIPRODEX ) OTIC suspension Place 4 drops into the right ear 2 (two) times daily. (Patient not taking: Reported on 12/22/2023) 7.5 mL 0   rosuvastatin  (CRESTOR ) 10 MG tablet Take 1 tablet (10 mg total) by mouth daily. (Patient not  taking: Reported on 12/22/2023) 90 tablet 1   Facility-Administered Medications Prior to Visit  Medication Dose Route Frequency Provider Last Rate Last Admin   [START ON 02/21/2024] denosumab  (PROLIA ) injection 60 mg  60 mg Subcutaneous Q6 months [name], MD        Review of Systems;  Patient denies headache, fevers, malaise, unintentional weight loss, skin rash, eye pain, sinus congestion and sinus pain, sore throat, dysphagia,  hemoptysis , cough, dyspnea, wheezing, chest pain, palpitations, orthopnea, edema, abdominal pain, nausea, melena, diarrhea, constipation, flank pain, dysuria, hematuria, urinary  Frequency, nocturia, numbness,  tingling, seizures,  Focal weakness, Loss of consciousness,  Tremor, insomnia, depression, anxiety, and suicidal ideation.      Objective:  BP 122/60   Pulse 89   Ht 5' (1.524 m)   Wt 151 lb 12.8 oz (68.9 kg)   SpO2 95%   BMI 29.65 kg/m   BP Readings from Last 3 Encounters:  12/24/23 122/60  09/17/23 (!) 122/58  07/17/23 113/71    Wt Readings from Last 3 Encounters:  12/24/23 151 lb 12.8 oz (68.9 kg)  12/22/23 150 lb (68 kg)  09/17/23 154 lb (69.9 kg)    Physical Exam Vitals reviewed.  Constitutional:      General: She is not in acute distress.    Appearance: Normal appearance. She is normal weight. She is not ill-appearing, toxic-appearing or diaphoretic.  HENT:     Head: Normocephalic.  Eyes:     General: No scleral icterus.       Right eye: No discharge.        Left eye: No discharge.     Conjunctiva/sclera: Conjunctivae normal.  Cardiovascular:     Rate and Rhythm: Normal rate and regular rhythm.     Heart sounds: Normal heart sounds.  Pulmonary:     Effort: Pulmonary effort is normal. No respiratory distress.     Breath sounds: Normal breath sounds.  Musculoskeletal:        General: Normal range of motion.  Skin:    General: Skin is warm and dry.  Neurological:     General: No focal deficit present.     Mental Status: She is alert and oriented to person, place, and time. Mental status is at baseline.  Psychiatric:        Mood and Affect: Mood normal.        Behavior: Behavior normal.        Thought Content: Thought content normal.        Judgment: Judgment normal.     Lab Results  Component Value Date   HGBA1C 6.2 06/11/2023   HGBA1C 6.4 12/11/2022   HGBA1C 6.1 (H) 06/06/2022    Lab Results  Component Value Date   CREATININE 0.53 12/22/2023   CREATININE 0.62 06/11/2023   CREATININE 0.63 12/11/2022    Lab Results  Component Value Date   WBC 6.1 12/22/2023   HGB 12.3 12/22/2023   HCT 37.3 12/22/2023   PLT 221.0 12/22/2023   GLUCOSE 94  12/22/2023   CHOL 178 12/22/2023   TRIG 55.0 12/22/2023   HDL 61.70 12/22/2023   LDLDIRECT 53.0 06/11/2023   LDLCALC 105 (H) 12/22/2023   ALT 12 12/22/2023   AST 14 12/22/2023   NA 133 (L) 12/22/2023   K 4.8 12/22/2023   CL 98 12/22/2023   CREATININE 0.53 12/22/2023   BUN 17 12/22/2023   CO2 28 12/22/2023   TSH 2.00 06/11/2023   HGBA1C 6.2 06/11/2023   MICROALBUR <  0.7 06/11/2023    MM 3D DIAGNOSTIC MAMMOGRAM BILATERAL BREAST Result Date: 08/06/2023 CLINICAL DATA:  Annual bilateral mammogram. Patient with history of left lumpectomy in August 2024 for left breast cancer and ADH EXAM: DIGITAL DIAGNOSTIC BILATERAL MAMMOGRAM WITH TOMOSYNTHESIS AND CAD TECHNIQUE: Bilateral digital diagnostic mammography and breast tomosynthesis was performed. The images were evaluated with computer-aided detection. COMPARISON:  Previous exam(s). ACR Breast Density Category b: There are scattered areas of fibroglandular density. FINDINGS: Postoperative changes in the left upper medial breast posterior depth. No suspicious findings seen in either breast. IMPRESSION: Benign post treatment changes of the left breast. RECOMMENDATION: Bilateral diagnostic mammogram in 1 year. I have discussed the findings and recommendations with the patient. If applicable, a reminder letter will be sent to the patient regarding the next appointment. BI-RADS CATEGORY  2: Benign. Electronically Signed   By: Rosina Gelineau M.D.   On: 08/06/2023 13:41    Assessment & Plan:  .Edema of left lower extremity Assessment & Plan: Muscle pain in anterior thighs has improved since stopping statin.  She is walking faster    Persistent atrial fibrillation [facility]) Assessment & Plan: She continues  anticoagulation  following cardoversion  and remains in sinus rhythm based on today's exam . ECHO reviewed. Reminded to continue eliquis  twice daily .    Vitamin D  deficiency -     VITAMIN D  25 Hydroxy (Vit-D Deficiency, Fractures); Future  Chronic  hyponatremia  Primary hypertension Assessment & Plan: Improved control with amlodipine  2.5 mg telmisartan  80 mg and  metoprolol  25 mg   daily.  Home readings have been  < 140/90  Orders: -     Comprehensive metabolic panel with GFR; Future  Long term (current) use of antithrombotics/antiplatelets -     CBC with Differential/Platelet; Future  Osteoarthritis of both knees, unspecified osteoarthritis type Assessment & Plan: Managed with analgesics and periodic I/a injections by orthopedics    Arthritis Assessment & Plan: Affecting low back, shoulders and  knees .  Advised to avoid NSAIDs due to use of Eliquis  and to maximise daily dose of tylenol .     Pure hypercholesterolemia Assessment & Plan: Reviewed the reasons for taking Crestor ,  but she reports myalgias with crestor  repeat trials   Lab Results  Component Value Date   CHOL 178 12/22/2023   HDL 61.70 12/22/2023   LDLCALC 105 (H) 12/22/2023   LDLDIRECT 53.0 06/11/2023   TRIG 55.0 12/22/2023   CHOLHDL 3 12/22/2023      Myalgia due to statin Assessment & Plan: She  developed diffuse muscle pain with initiation of  rosuvastatin  for management of aortic atherosclerosis.  Her LDL is 105  without medication; given her age,  further treatment is deferred    Lab Results  Component Value Date   CHOL 178 12/22/2023   HDL 61.70 12/22/2023   LDLCALC 105 (H) 12/22/2023   LDLDIRECT 53.0 06/11/2023   TRIG 55.0 12/22/2023   CHOLHDL 3 12/22/2023      Other orders -     amLODIPine  Besylate; Take 1 tablet (2.5 mg total) by mouth daily.  Dispense: 90 tablet; Refill: 1     I spent 34 minutes on the day of this face to face encounter reviewing patient's  most recent visit with cardiology,  nephrology,  and neurology,  prior relevant surgical and non surgical procedures, recent  labs and imaging studies, counseling on weight management,  reviewing the assessment and plan with patient, and post visit ordering and reviewing of   diagnostics and  therapeutics with patient  .   Follow-up: Return in about 6 months (around 06/22/2024) for hypertension.   Verneita LITTIE Kettering, MD

## 2023-12-24 NOTE — Assessment & Plan Note (Signed)
 She continues  anticoagulation  following cardoversion  and remains in sinus rhythm based on today's exam . ECHO reviewed. Reminded to continue eliquis  twice daily .

## 2023-12-24 NOTE — Assessment & Plan Note (Signed)
 Muscle pain in anterior thighs has improved since stopping statin.  She is walking faster

## 2023-12-26 ENCOUNTER — Encounter: Payer: Self-pay | Admitting: Internal Medicine

## 2023-12-26 NOTE — Assessment & Plan Note (Signed)
 Managed with analgesics and periodic I/a injections by orthopedics

## 2023-12-26 NOTE — Assessment & Plan Note (Signed)
 She  developed diffuse muscle pain with initiation of  rosuvastatin  for management of aortic atherosclerosis.  Her LDL is 105  without medication; given her age,  further treatment is deferred    Lab Results  Component Value Date   CHOL 178 12/22/2023   HDL 61.70 12/22/2023   LDLCALC 105 (H) 12/22/2023   LDLDIRECT 53.0 06/11/2023   TRIG 55.0 12/22/2023   CHOLHDL 3 12/22/2023

## 2023-12-26 NOTE — Assessment & Plan Note (Signed)
Improved contro lwith amlodipine 2.5 mg telmisartan 80 mg and  metoprolol 25 mg   daily.  Home readings have been  < 140/90

## 2023-12-26 NOTE — Assessment & Plan Note (Signed)
 Affecting low back, shoulders and  knees .  Advised to avoid NSAIDs due to use of Eliquis  and to maximise daily dose of tylenol .

## 2023-12-26 NOTE — Assessment & Plan Note (Signed)
 Reviewed the reasons for taking Crestor ,  but she reports myalgias with crestor  repeat trials   Lab Results  Component Value Date   CHOL 178 12/22/2023   HDL 61.70 12/22/2023   LDLCALC 105 (H) 12/22/2023   LDLDIRECT 53.0 06/11/2023   TRIG 55.0 12/22/2023   CHOLHDL 3 12/22/2023

## 2023-12-31 ENCOUNTER — Encounter: Payer: Self-pay | Admitting: Orthopaedic Surgery

## 2023-12-31 ENCOUNTER — Ambulatory Visit: Admitting: Orthopaedic Surgery

## 2023-12-31 DIAGNOSIS — G8929 Other chronic pain: Secondary | ICD-10-CM | POA: Diagnosis not present

## 2023-12-31 DIAGNOSIS — M25512 Pain in left shoulder: Secondary | ICD-10-CM

## 2023-12-31 MED ORDER — LIDOCAINE HCL 1 % IJ SOLN
3.0000 mL | INTRAMUSCULAR | Status: AC | PRN
  Administered 2023-12-31: 3 mL

## 2023-12-31 MED ORDER — METHYLPREDNISOLONE ACETATE 40 MG/ML IJ SUSP
40.0000 mg | INTRAMUSCULAR | Status: AC | PRN
  Administered 2023-12-31: 40 mg via INTRA_ARTICULAR

## 2023-12-31 NOTE — Progress Notes (Signed)
 The patient is well-known to us .  She comes in about every 3 to 4 months for steroid injection in her left shoulder and her left knee.  She says the left knee is actually doing well but she would still like to have a steroid injection in her left shoulder.  She is an active 83 years old.  She said the shoulder injections worked great.  She has had no acute change in her medical status.  Her left shoulder does show signs of some arthropathy but good motion overall in terms of tolerating her ADLs.  Per request I did place a steroid injection in her left shoulder subacromial outlet like we have done before previously.  She tolerated it well.  We can see her back in 3 months for repeat injection if needed.    Procedure Note  Patient: Lacey Brown             Date of Birth: 1940/09/25           MRN: [id number]             Visit Date: 12/31/2023  Procedures: Visit Diagnoses:  1. Chronic left shoulder pain     Large Joint Inj: L subacromial bursa on 12/31/2023 2:15 PM Indications: pain and diagnostic evaluation Details: 22 G 1.5 in needle  Arthrogram: No  Medications: 3 mL lidocaine  1 %; 40 mg methylPREDNISolone  acetate 40 MG/ML Outcome: tolerated well, no immediate complications Procedure, treatment alternatives, risks and benefits explained, specific risks discussed. Consent was given by the patient. Immediately prior to procedure a time out was called to verify the correct patient, procedure, equipment, support staff and site/side marked as required. Patient was prepped and draped in the usual sterile fashion.

## 2024-01-05 ENCOUNTER — Telehealth: Payer: Self-pay | Admitting: Internal Medicine

## 2024-01-05 ENCOUNTER — Ambulatory Visit: Payer: Self-pay | Admitting: *Deleted

## 2024-01-05 NOTE — Telephone Encounter (Signed)
 FYI Only or Action Required?: FYI only for provider: patient was scheduled for an appointment by office per chart review.  Patient was last seen in primary care on 12/24/2023 by [name], MD.  Triage Disposition: No Contact Calls  Patient/caregiver understands and will follow disposition?: Yes  Reason for Disposition  Patient already left for the hospital/clinic.  Answer Assessment - Initial Assessment Questions Per chart review, patient went to the office to speak directly with staff. Patient reported dizzy spells to staff. Patient was scheduled to see PCP tomorrow.  Protocols used: No Contact or Duplicate Contact Call-A-AH

## 2024-01-05 NOTE — Telephone Encounter (Signed)
 Pt came into the office wanting to know if she can be seen or use that 5:00 slot today. Pt stating she has been having dizziness spells off and on. Please advise.

## 2024-01-05 NOTE — Telephone Encounter (Signed)
 LMTCB

## 2024-01-05 NOTE — Telephone Encounter (Signed)
 Copied from CRM #[id number]. Topic: Clinical - Red Word Triage >> Jan 05, 2024  1:02 PM Taleah C wrote: Red Word that prompted transfer to Nurse Triage: dizzy spells  Patient calling office- nurse in office not available- sent to NT for dizziness. Caller not on the line at transfer- attempted to call patient- no answer- left VM to call back.

## 2024-01-05 NOTE — Telephone Encounter (Signed)
 Spoke with pt and she has decided to take the 9 am appt tomorrow with Dr. Marylynn.

## 2024-01-05 NOTE — Telephone Encounter (Signed)
 Pt called back and stated the call dropped while speaking with Harlene. Per CAL, I advised pt that a message would be sent back to return call when available. Please advise.

## 2024-01-05 NOTE — Telephone Encounter (Signed)
 See previous message. Pt has been scheduled for 01/06/2024.

## 2024-01-06 ENCOUNTER — Ambulatory Visit: Admitting: Internal Medicine

## 2024-01-06 ENCOUNTER — Encounter: Payer: Self-pay | Admitting: Internal Medicine

## 2024-01-06 VITALS — BP 160/84 | HR 79 | Ht 60.0 in | Wt 150.4 lb

## 2024-01-06 DIAGNOSIS — R42 Dizziness and giddiness: Secondary | ICD-10-CM | POA: Diagnosis not present

## 2024-01-06 MED ORDER — FLUTICASONE PROPIONATE 50 MCG/ACT NA SUSP: 2.0000 | Freq: Every day | NASAL | 6 refills | Status: AC

## 2024-01-06 NOTE — Patient Instructions (Addendum)
 Your light headed feeling may be due to placque in one or both of your carotid arteries.  This is going to be investigated with a test called a carotid doppler.  We will do this at [facility] on the day you see Dr Ginny (January 15)  If you are not contacted in the next 2 weeks to arrange this, please let me know.    In the meantime,  let's try treating your sinus pressure to see if the symptoms resolve:  Resume using  Flonase every  day  2 squrits on each side   Use Afrin nasal spray every 12 hours for just 5 days,  then STOP

## 2024-01-06 NOTE — Progress Notes (Signed)
 Subjective:  Patient ID: Lacey Brown, female    DOB: 1940/06/03  Age: 83 y.o. MRN: [id number]  CC: The encounter diagnosis was Light-headed feeling.   HPI Lacey Brown presents for  Chief Complaint  Patient presents with   Dizziness    X 3 weeks. Notices it more when turning head or laying down.    83 yr old female with COPD, chronic atrial fib and hyponatremia presents with dizziness  f for hte past several weeks  she first noticed with twisting of neck while preparing for bed.  Not noticed during the day but recurs with head turn . Not describing vertigo just feels  light headed . Has also occurred while standing with head movement. .  Drives but does not turn head in car  feels that it has been more   Has recurrent sinus pressure  and intermittent PND     Outpatient Medications Prior to Visit  Medication Sig Dispense Refill   acetaminophen  (TYLENOL ) 500 MG tablet Take 500 mg by mouth every 6 (six) hours as needed.     acetaminophen  (TYLENOL ) 650 MG CR tablet Take 2 tablets by mouth 2 (two) times daily.     albuterol  (VENTOLIN  HFA) 108 (90 Base) MCG/ACT inhaler Inhale 2 puffs into the lungs every 6 (six) hours as needed for wheezing or shortness of breath. 3.7 g 11   amLODipine  (NORVASC ) 2.5 MG tablet Take 1 tablet (2.5 mg total) by mouth daily. 90 tablet 1   anastrozole  (ARIMIDEX ) 1 MG tablet TAKE 1 TABLET BY MOUTH EVERY DAY 90 tablet 3   apixaban  (ELIQUIS ) 5 MG TABS tablet TAKE 1 TABLET BY MOUTH TWICE A DAY 180 tablet 1   denosumab  (PROLIA ) 60 MG/ML SOSY injection Inject 60 mg into the skin every 6 (six) months.     metoprolol  succinate (TOPROL -XL) 25 MG 24 hr tablet TAKE 1 TABLET (25 MG TOTAL) BY MOUTH DAILY. 90 tablet 1   omeprazole  (PRILOSEC  OTC) 20 MG tablet Take 1 tablet (20 mg total) by mouth daily. In the morning on an empty stomach (Patient taking differently: Take 20 mg by mouth daily as needed. In the morning on an empty stomach) 90 tablet 1   telmisartan   (MICARDIS ) 80 MG tablet TAKE 1 TABLET BY MOUTH EVERY DAY 90 tablet 1   trolamine salicylate (ASPERCREME) 10 % cream Apply 1 Application topically as needed for muscle pain.     WIXELA INHUB 250-50 MCG/ACT AEPB INHALE 1 PUFF INTO THE LUNGS IN THE MORNING AND AT BEDTIME. 60 each 1   Facility-Administered Medications Prior to Visit  Medication Dose Route Frequency Provider Last Rate Last Admin   [START ON 02/21/2024] denosumab  (PROLIA ) injection 60 mg  60 mg Subcutaneous Q6 months [name], MD        Review of Systems;  Patient denies headache, fevers, malaise, unintentional weight loss, skin rash, eye pain, sinus congestion and sinus pain, sore throat, dysphagia,  hemoptysis , cough, dyspnea, wheezing, chest pain, palpitations, orthopnea, edema, abdominal pain, nausea, melena, diarrhea, constipation, flank pain, dysuria, hematuria, urinary  Frequency, nocturia, numbness, tingling, seizures,  Focal weakness, Loss of consciousness,  Tremor, insomnia, depression, anxiety, and suicidal ideation.      Objective:  BP (!) 160/84   Pulse 79   Ht 5' (1.524 m)   Wt 150 lb 6.4 oz (68.2 kg)   SpO2 94%   BMI 29.37 kg/m   BP Readings from Last 3 Encounters:  01/06/24 (!) 160/84  12/24/23  122/60  09/17/23 (!) 122/58    Wt Readings from Last 3 Encounters:  01/06/24 150 lb 6.4 oz (68.2 kg)  12/24/23 151 lb 12.8 oz (68.9 kg)  12/22/23 150 lb (68 kg)    Physical Exam Vitals reviewed.  Constitutional:      General: She is not in acute distress.    Appearance: Normal appearance. She is normal weight. She is not ill-appearing, toxic-appearing or diaphoretic.  HENT:     Head: Normocephalic.     Right Ear: Tympanic membrane normal.     Left Ear: Tympanic membrane normal.     Nose: Nose normal.  Eyes:     General: No scleral icterus.       Right eye: No discharge.        Left eye: No discharge.     Extraocular Movements: Extraocular movements intact.     Conjunctiva/sclera: Conjunctivae  normal.     Pupils: Pupils are equal, round, and reactive to light.  Cardiovascular:     Rate and Rhythm: Normal rate and regular rhythm.     Heart sounds: Normal heart sounds.  Pulmonary:     Effort: Pulmonary effort is normal. No respiratory distress.     Breath sounds: Normal breath sounds.  Musculoskeletal:        General: Normal range of motion.     Cervical back: Normal range of motion and neck supple.  Skin:    General: Skin is warm and dry.  Neurological:     General: No focal deficit present.     Mental Status: She is alert and oriented to person, place, and time. Mental status is at baseline.  Psychiatric:        Mood and Affect: Mood normal.        Behavior: Behavior normal.        Thought Content: Thought content normal.        Judgment: Judgment normal.     Lab Results  Component Value Date   HGBA1C 6.2 06/11/2023   HGBA1C 6.4 12/11/2022   HGBA1C 6.1 (H) 06/06/2022    Lab Results  Component Value Date   CREATININE 0.53 12/22/2023   CREATININE 0.62 06/11/2023   CREATININE 0.63 12/11/2022    Lab Results  Component Value Date   WBC 6.1 12/22/2023   HGB 12.3 12/22/2023   HCT 37.3 12/22/2023   PLT 221.0 12/22/2023   GLUCOSE 94 12/22/2023   CHOL 178 12/22/2023   TRIG 55.0 12/22/2023   HDL 61.70 12/22/2023   LDLDIRECT 53.0 06/11/2023   LDLCALC 105 (H) 12/22/2023   ALT 12 12/22/2023   AST 14 12/22/2023   NA 133 (L) 12/22/2023   K 4.8 12/22/2023   CL 98 12/22/2023   CREATININE 0.53 12/22/2023   BUN 17 12/22/2023   CO2 28 12/22/2023   TSH 2.00 06/11/2023   HGBA1C 6.2 06/11/2023   MICROALBUR <0.7 06/11/2023    MM 3D DIAGNOSTIC MAMMOGRAM BILATERAL BREAST Result Date: 08/06/2023 CLINICAL DATA:  Annual bilateral mammogram. Patient with history of left lumpectomy in August 2024 for left breast cancer and ADH EXAM: DIGITAL DIAGNOSTIC BILATERAL MAMMOGRAM WITH TOMOSYNTHESIS AND CAD TECHNIQUE: Bilateral digital diagnostic mammography and breast tomosynthesis  was performed. The images were evaluated with computer-aided detection. COMPARISON:  Previous exam(s). ACR Breast Density Category b: There are scattered areas of fibroglandular density. FINDINGS: Postoperative changes in the left upper medial breast posterior depth. No suspicious findings seen in either breast. IMPRESSION: Benign post treatment changes of the left breast. RECOMMENDATION:  Bilateral diagnostic mammogram in 1 year. I have discussed the findings and recommendations with the patient. If applicable, a reminder letter will be sent to the patient regarding the next appointment. BI-RADS CATEGORY  2: Benign. Electronically Signed   By: Rosina Gelineau M.D.   On: 08/06/2023 13:41    Assessment & Plan:  .Light-headed feeling Assessment & Plan: Occurring only with head turn.  No vertigo symptoms . Will treat sinus congestion and refer for carotid dopplers    Orders: -     US  Carotid Bilateral; Future  Other orders -     Fluticasone  Propionate; Place 2 sprays into both nostrils daily.  Dispense: 16 g; Refill: 6     Follow-up: Return in about 3 months (around 04/05/2024).   Verneita LITTIE Kettering, MD

## 2024-01-06 NOTE — Assessment & Plan Note (Signed)
 Occurring only with head turn.  No vertigo symptoms . Will treat sinus congestion and refer for carotid dopplers

## 2024-01-07 ENCOUNTER — Other Ambulatory Visit: Payer: Self-pay | Admitting: Internal Medicine

## 2024-01-08 DIAGNOSIS — M47816 Spondylosis without myelopathy or radiculopathy, lumbar region: Secondary | ICD-10-CM | POA: Diagnosis not present

## 2024-01-09 ENCOUNTER — Ambulatory Visit

## 2024-01-09 DIAGNOSIS — I10 Essential (primary) hypertension: Secondary | ICD-10-CM | POA: Diagnosis not present

## 2024-01-09 NOTE — Progress Notes (Signed)
 Patient here for nurse visit BP check per order from Dr Marylynn   Patient reports compliance with prescribed BP medications: yes  Last dose of BP medication:   BP Readings from Last 3 Encounters:  01/09/24 (!) 142/80  01/06/24 (!) 160/84  12/24/23 122/60    Pt encouraged to continue bp regimen per provider Patient verbalized understanding of instructions.

## 2024-01-14 ENCOUNTER — Ambulatory Visit: Admitting: Orthopaedic Surgery

## 2024-01-16 ENCOUNTER — Ambulatory Visit (HOSPITAL_COMMUNITY): Admission: EM | Admit: 2024-01-16 | Discharge: 2024-01-16 | Disposition: A | Discharge status: Home / Self Care

## 2024-01-16 ENCOUNTER — Observation Stay (HOSPITAL_COMMUNITY)
Admission: EM | Admit: 2024-01-16 | Discharge: 2024-01-18 | DRG: 378 | Disposition: A | Attending: Internal Medicine | Admitting: Internal Medicine

## 2024-01-16 ENCOUNTER — Encounter (HOSPITAL_COMMUNITY): Payer: Self-pay | Admitting: *Deleted

## 2024-01-16 ENCOUNTER — Encounter (HOSPITAL_COMMUNITY): Payer: Self-pay

## 2024-01-16 ENCOUNTER — Other Ambulatory Visit: Payer: Self-pay

## 2024-01-16 ENCOUNTER — Ambulatory Visit: Payer: Self-pay

## 2024-01-16 DIAGNOSIS — K921 Melena: Secondary | ICD-10-CM

## 2024-01-16 DIAGNOSIS — Z7901 Long term (current) use of anticoagulants: Secondary | ICD-10-CM

## 2024-01-16 DIAGNOSIS — R0789 Other chest pain: Secondary | ICD-10-CM | POA: Diagnosis not present

## 2024-01-16 DIAGNOSIS — E871 Hypo-osmolality and hyponatremia: Secondary | ICD-10-CM | POA: Diagnosis present

## 2024-01-16 DIAGNOSIS — J449 Chronic obstructive pulmonary disease, unspecified: Secondary | ICD-10-CM | POA: Diagnosis present

## 2024-01-16 DIAGNOSIS — E7800 Pure hypercholesterolemia, unspecified: Secondary | ICD-10-CM | POA: Diagnosis present

## 2024-01-16 DIAGNOSIS — D62 Acute posthemorrhagic anemia: Secondary | ICD-10-CM | POA: Diagnosis not present

## 2024-01-16 DIAGNOSIS — K3189 Other diseases of stomach and duodenum: Secondary | ICD-10-CM | POA: Diagnosis not present

## 2024-01-16 DIAGNOSIS — I4819 Other persistent atrial fibrillation: Secondary | ICD-10-CM | POA: Diagnosis present

## 2024-01-16 DIAGNOSIS — I10 Essential (primary) hypertension: Secondary | ICD-10-CM | POA: Diagnosis present

## 2024-01-16 DIAGNOSIS — K254 Chronic or unspecified gastric ulcer with hemorrhage: Secondary | ICD-10-CM | POA: Diagnosis not present

## 2024-01-16 DIAGNOSIS — D6869 Other thrombophilia: Secondary | ICD-10-CM | POA: Diagnosis present

## 2024-01-16 DIAGNOSIS — K219 Gastro-esophageal reflux disease without esophagitis: Secondary | ICD-10-CM | POA: Diagnosis present

## 2024-01-16 DIAGNOSIS — Z170 Estrogen receptor positive status [ER+]: Secondary | ICD-10-CM

## 2024-01-16 DIAGNOSIS — K2970 Gastritis, unspecified, without bleeding: Secondary | ICD-10-CM | POA: Diagnosis not present

## 2024-01-16 HISTORY — DX: Acute posthemorrhagic anemia: D62

## 2024-01-16 LAB — HEMOGLOBIN AND HEMATOCRIT, BLOOD
HCT: 22.9 % — ABNORMAL LOW (ref 36.0–46.0)
Hemoglobin: 7.5 g/dL — ABNORMAL LOW (ref 12.0–15.0)

## 2024-01-16 LAB — CBC WITH DIFFERENTIAL/PLATELET
Abs Immature Granulocytes: 0.2 K/uL — ABNORMAL HIGH (ref 0.00–0.07)
Basophils Absolute: 0.1 K/uL (ref 0.0–0.1)
Basophils Relative: 1 %
Eosinophils Absolute: 0 K/uL (ref 0.0–0.5)
Eosinophils Relative: 0 %
HCT: 20 % — ABNORMAL LOW (ref 36.0–46.0)
Hemoglobin: 6.5 g/dL — CL (ref 12.0–15.0)
Immature Granulocytes: 2 %
Lymphocytes Relative: 12 %
Lymphs Abs: 1.5 K/uL (ref 0.7–4.0)
MCH: 30.4 pg (ref 26.0–34.0)
MCHC: 32.5 g/dL (ref 30.0–36.0)
MCV: 93.5 fL (ref 80.0–100.0)
Monocytes Absolute: 1.3 K/uL — ABNORMAL HIGH (ref 0.1–1.0)
Monocytes Relative: 11 %
Neutro Abs: 8.8 K/uL — ABNORMAL HIGH (ref 1.7–7.7)
Neutrophils Relative %: 74 %
Platelets: 180 K/uL (ref 150–400)
RBC: 2.14 MIL/uL — ABNORMAL LOW (ref 3.87–5.11)
RDW: 13.3 % (ref 11.5–15.5)
WBC: 11.9 K/uL — ABNORMAL HIGH (ref 4.0–10.5)
nRBC: 0.2 % (ref 0.0–0.2)

## 2024-01-16 LAB — PREPARE RBC (CROSSMATCH)

## 2024-01-16 LAB — COMPREHENSIVE METABOLIC PANEL WITH GFR
ALT: 15 U/L (ref 0–44)
AST: 15 U/L (ref 15–41)
Albumin: 3 g/dL — ABNORMAL LOW (ref 3.5–5.0)
Alkaline Phosphatase: 21 U/L — ABNORMAL LOW (ref 38–126)
Anion gap: 8 (ref 5–15)
BUN: 36 mg/dL — ABNORMAL HIGH (ref 8–23)
CO2: 23 mmol/L (ref 22–32)
Calcium: 8.5 mg/dL — ABNORMAL LOW (ref 8.9–10.3)
Chloride: 99 mmol/L (ref 98–111)
Creatinine, Ser: 0.66 mg/dL (ref 0.44–1.00)
GFR, Estimated: >60 mL/min (ref >60)
Glucose, Bld: 114 mg/dL — ABNORMAL HIGH (ref 70–99)
Potassium: 4.3 mmol/L (ref 3.5–5.1)
Sodium: 130 mmol/L — ABNORMAL LOW (ref 135–145)
Total Bilirubin: 0.4 mg/dL (ref 0.0–1.2)
Total Protein: 5.2 g/dL — ABNORMAL LOW (ref 6.5–8.1)

## 2024-01-16 LAB — PROTIME-INR
INR: 1.3 — ABNORMAL HIGH (ref 0.8–1.2)
Prothrombin Time: 17.2 s — ABNORMAL HIGH (ref 11.4–15.2)

## 2024-01-16 LAB — IRON AND TIBC
Iron: 87 ug/dL (ref 28–170)
Saturation Ratios: 28 % (ref 10.4–31.8)
TIBC: 316 ug/dL (ref 250–450)
UIBC: 229 ug/dL

## 2024-01-16 LAB — MAGNESIUM: Magnesium: 1.9 mg/dL (ref 1.7–2.4)

## 2024-01-16 LAB — PHOSPHORUS: Phosphorus: 3.5 mg/dL (ref 2.5–4.6)

## 2024-01-16 LAB — POC OCCULT BLOOD, ED: Fecal Occult Bld: POSITIVE — AB

## 2024-01-16 LAB — VITAMIN B12: Vitamin B-12: 179 pg/mL — ABNORMAL LOW (ref 180–914)

## 2024-01-16 LAB — ABO/RH: ABO/RH(D): O POS

## 2024-01-16 LAB — FOLATE: Folate: >20 ng/mL (ref >5.9)

## 2024-01-16 MED ORDER — HYDROMORPHONE HCL 1 MG/ML IJ SOLN: 0.5000 mg | INTRAMUSCULAR | Status: DC | PRN

## 2024-01-16 MED ORDER — ACETAMINOPHEN 650 MG RE SUPP: 650.0000 mg | Freq: Four times a day (QID) | RECTAL | Status: DC | PRN

## 2024-01-16 MED ORDER — HEPARIN SODIUM (PORCINE) 5000 UNIT/ML IJ SOLN: 5000.0000 [IU] | Freq: Three times a day (TID) | INTRAMUSCULAR | Status: DC

## 2024-01-16 MED ORDER — BISACODYL 5 MG PO TBEC: 5.0000 mg | DELAYED_RELEASE_TABLET | Freq: Every day | ORAL | Status: DC | PRN

## 2024-01-16 MED ORDER — SENNOSIDES-DOCUSATE SODIUM 8.6-50 MG PO TABS: 1.0000 | ORAL_TABLET | Freq: Every evening | ORAL | Status: DC | PRN

## 2024-01-16 MED ORDER — OXYCODONE HCL 5 MG PO TABS: 5.0000 mg | ORAL_TABLET | ORAL | Status: DC | PRN

## 2024-01-16 MED ORDER — FUROSEMIDE 10 MG/ML IJ SOLN
20.0000 mg | Freq: Once | INTRAMUSCULAR | Status: AC
  Administered 2024-01-16: 20 mg via INTRAVENOUS
  Filled 2024-01-16: qty 2

## 2024-01-16 MED ORDER — FLEET ENEMA RE ENEM: 1.0000 | ENEMA | Freq: Once | RECTAL | Status: DC | PRN

## 2024-01-16 MED ORDER — SODIUM CHLORIDE 0.9% FLUSH: 3.0000 mL | Freq: Two times a day (BID) | INTRAVENOUS | Status: DC

## 2024-01-16 MED ORDER — ACETAMINOPHEN 325 MG PO TABS: 650.0000 mg | ORAL_TABLET | Freq: Four times a day (QID) | ORAL | Status: DC | PRN

## 2024-01-16 MED ORDER — ONDANSETRON HCL 4 MG PO TABS: 4.0000 mg | ORAL_TABLET | Freq: Four times a day (QID) | ORAL | Status: DC | PRN

## 2024-01-16 MED ORDER — SODIUM CHLORIDE 0.9 % IV SOLN: INTRAVENOUS | Status: DC

## 2024-01-16 MED ORDER — ACETAMINOPHEN 325 MG PO TABS
650.0000 mg | ORAL_TABLET | Freq: Once | ORAL | Status: AC
  Administered 2024-01-16: 650 mg via ORAL
  Filled 2024-01-16: qty 2

## 2024-01-16 MED ORDER — ONDANSETRON HCL 4 MG/2ML IJ SOLN: 4.0000 mg | Freq: Four times a day (QID) | INTRAMUSCULAR | Status: DC | PRN

## 2024-01-16 MED ORDER — IPRATROPIUM BROMIDE 0.02 % IN SOLN: 0.5000 mg | Freq: Four times a day (QID) | RESPIRATORY_TRACT | Status: DC | PRN

## 2024-01-16 MED ORDER — TRAZODONE HCL 50 MG PO TABS: 25.0000 mg | ORAL_TABLET | Freq: Every evening | ORAL | Status: DC | PRN

## 2024-01-16 MED ORDER — ANASTROZOLE 1 MG PO TABS
1.0000 mg | ORAL_TABLET | Freq: Every day | ORAL | Status: DC
  Administered 2024-01-17 – 2024-01-18 (×2): 1 mg via ORAL
  Filled 2024-01-16 (×2): qty 1

## 2024-01-16 MED ORDER — PANTOPRAZOLE SODIUM 40 MG IV SOLR
40.0000 mg | INTRAVENOUS | Status: AC
  Administered 2024-01-16: 40 mg via INTRAVENOUS
  Filled 2024-01-16: qty 10

## 2024-01-16 MED ORDER — SODIUM CHLORIDE 0.9% IV SOLUTION: Freq: Once | INTRAVENOUS | Status: DC

## 2024-01-16 MED ORDER — METOPROLOL SUCCINATE ER 25 MG PO TB24
25.0000 mg | ORAL_TABLET | Freq: Every day | ORAL | Status: DC
  Administered 2024-01-17 – 2024-01-18 (×2): 25 mg via ORAL
  Filled 2024-01-16 (×2): qty 1

## 2024-01-16 MED ORDER — HYDRALAZINE HCL 20 MG/ML IJ SOLN: 10.0000 mg | INTRAMUSCULAR | Status: DC | PRN

## 2024-01-16 MED ADMIN — Pantoprazole Sodium For IV Soln 40 MG (Base Equiv): 40 mg | INTRAVENOUS | NDC 00008092351

## 2024-01-16 MED FILL — Pantoprazole Sodium For IV Soln 40 MG (Base Equiv): 40.0000 mg | INTRAVENOUS | Qty: 10 | Status: AC

## 2024-01-16 NOTE — ED Provider Notes (Signed)
 [facility] [place]  Note:  This document was prepared using Conservation officer, historic buildings and may include unintentional dictation errors.  MRN: [id number] DOB: 05-20-1940  Subjective:   Lacey Brown is a 83 y.o. female presenting for evaluation for new onset chest pain that began last night.  Patient reports mild nausea, epigastric pain, vomiting x 1 as well as dark stools.  Patient reports past history of A-fib, GERD, hypertension, previous cardioversion performed by cardiology.  No shortness of breath, weakness, dizziness, fever.  Current Medications[1]   Allergies[2]  Past Medical History:  Diagnosis Date   Arthritis    back, neck; right shoulder;    Asthma    using advair prn   Cancer (HCC) 2024   Left Breast Cancer   Chicken pox    Dyspnea    Dysrhythmia 09/13/2022   Atrial Fibrillation   Hyperlipidemia    Hypertension    controlled with medication;    New onset of headache in cancer patient 11/17/2022   Peripheral vascular disease    mild dilation of aorta     Past Surgical History:  Procedure Laterality Date   BREAST BIOPSY Left 08/12/2022   US  LT BREAST BX W LOC DEV 1ST LESION IMG BX SPEC US  GUIDE 08/12/2022 [facility]   BREAST BIOPSY Left 08/30/2022   MM LT BREAST BX W LOC DEV EA AD LESION IMG BX SPEC STEREO GUIDE 08/30/2022 [facility]   BREAST BIOPSY Left 08/30/2022   MM LT BREAST BX W LOC DEV 1ST LESION IMAGE BX SPEC STEREO GUIDE 08/30/2022 [facility]   BREAST BIOPSY  09/18/2022   MM LT RADIOACTIVE SEED EA ADD LESION LOC MAMMO GUIDE 09/18/2022 [facility]   BREAST BIOPSY  09/18/2022   MM LT RADIOACTIVE SEED LOC MAMMO GUIDE 09/18/2022 [facility]   BREAST EXCISIONAL BIOPSY Left    BREAST LUMPECTOMY WITH RADIOACTIVE SEED LOCALIZATION Left 09/19/2022   Procedure: LEFT BREAST SEED BRACKETED LUMPECTOMY;  Surgeon: Aron Shoulders, MD;  Location: [facility] OR;  Service: General;  Laterality: Left;   CARDIOVERSION N/A  10/23/2022   Procedure: CARDIOVERSION;  Surgeon: Delford Maude BROCKS, MD;  Location: [facility]AS[facility];  Service: Cardiovascular;  Laterality: N/A;   COLONOSCOPY  2014   FOOT SURGERY Right 1973   cyst removed off right foot   TONSILLECTOMY AND ADENOIDECTOMY  1953    Family History  Problem Relation Age of Onset   Heart disease Mother    Heart disease Father    Arthritis Maternal Grandmother    Diabetes Neg Hx    Cancer Neg Hx    Stroke Neg Hx     Social History[3]  ROS Refer to HPI for ROS details.  Objective:    Vitals: BP (!) 138/101   Pulse (!) 103   Temp 97.7 F (36.5 C)   Resp 20   Physical Exam Vitals and nursing note reviewed.  Constitutional:      General: She is not in acute distress.    Appearance: She is well-developed. She is not ill-appearing or toxic-appearing.  HENT:     Head: Normocephalic and atraumatic.  Cardiovascular:     Rate and Rhythm: Tachycardia present. Rhythm irregular.     Heart sounds: Murmur heard.  Pulmonary:     Effort: Pulmonary effort is normal. No respiratory distress.     Breath sounds: Normal breath sounds. No stridor. No wheezing, rhonchi or rales.  Chest:     Chest wall: No tenderness.  Skin:    General:  Skin is warm and dry.  Neurological:     General: No focal deficit present.     Mental Status: She is alert and oriented to person, place, and time.  Psychiatric:        Mood and Affect: Mood normal.        Behavior: Behavior normal.     Procedures  No results found for this or any previous visit (from the past 24 hours).  Assessment and Plan :     Discharge Instructions       1. Atypical chest pain (Primary) - ED EKG completed in UC shows sinus tachycardia with premature atrial complexes, borderline EKG, no STEMI, ventricular rate of 103 bpm. - Based on presentation with chest pain and previous history of cardioversion and atrial fibrillation recommend further evaluation and treatment in the ER. - Please go  directly to [facility] emergency room for further evaluation and treatment after leaving urgent care.      Stephone Gum B Senay Sistrunk    [1]  Current Facility-Administered Medications:    [START ON 02/21/2024] denosumab  (PROLIA ) injection 60 mg, 60 mg, Subcutaneous, Q6 months, Tullo, Verneita CROME, MD  Current Outpatient Medications:    acetaminophen  (TYLENOL ) 500 MG tablet, Take 500 mg by mouth every 6 (six) hours as needed., Disp: , Rfl:    acetaminophen  (TYLENOL ) 650 MG CR tablet, Take 2 tablets by mouth 2 (two) times daily., Disp: , Rfl:    albuterol  (VENTOLIN  HFA) 108 (90 Base) MCG/ACT inhaler, Inhale 2 puffs into the lungs every 6 (six) hours as needed for wheezing or shortness of breath., Disp: 3.7 g, Rfl: 11   amLODipine  (NORVASC ) 2.5 MG tablet, Take 1 tablet (2.5 mg total) by mouth daily., Disp: 90 tablet, Rfl: 1   anastrozole  (ARIMIDEX ) 1 MG tablet, TAKE 1 TABLET BY MOUTH EVERY DAY, Disp: 90 tablet, Rfl: 3   apixaban  (ELIQUIS ) 5 MG TABS tablet, TAKE 1 TABLET BY MOUTH TWICE A DAY, Disp: 180 tablet, Rfl: 1   denosumab  (PROLIA ) 60 MG/ML SOSY injection, Inject 60 mg into the skin every 6 (six) months., Disp: , Rfl:    fluticasone  (FLONASE ) 50 MCG/ACT nasal spray, Place 2 sprays into both nostrils daily., Disp: 16 g, Rfl: 6   metoprolol  succinate (TOPROL -XL) 25 MG 24 hr tablet, TAKE 1 TABLET (25 MG TOTAL) BY MOUTH DAILY., Disp: 90 tablet, Rfl: 1   omeprazole  (PRILOSEC  OTC) 20 MG tablet, Take 1 tablet (20 mg total) by mouth daily. In the morning on an empty stomach (Patient taking differently: Take 20 mg by mouth daily as needed. In the morning on an empty stomach), Disp: 90 tablet, Rfl: 1   telmisartan  (MICARDIS ) 80 MG tablet, TAKE 1 TABLET BY MOUTH EVERY DAY, Disp: 90 tablet, Rfl: 1   trolamine salicylate (ASPERCREME) 10 % cream, Apply 1 Application topically as needed for muscle pain., Disp: , Rfl:    WIXELA INHUB 250-50 MCG/ACT AEPB, INHALE 1 PUFF INTO THE LUNGS IN THE MORNING AND AT BEDTIME.,  Disp: 60 each, Rfl: 1 [2]  Allergies Allergen Reactions   Ace Inhibitors Cough   Carvedilol      Dizziness    Codeine Nausea Only   Kenalog  [Triamcinolone  Acetonide] Other (See Comments)    11/2020 post-injection pain after knee injection.  Kenalog  preservatives most likely. She should have no issue with oral steroids, or other injectables.  (Ie. Depo-Medrol , Solu-Medrol , Decadron , Celestone, etc.)  [3]  Social History Tobacco Use   Smoking status: Former    Current packs/day: 0.00  Types: Cigarettes    Quit date: 04/17/2009    Years since quitting: 14.7   Smokeless tobacco: Never   Tobacco comments:    Former smoker 09/26/22  Vaping Use   Vaping status: Never Used  Substance Use Topics   Alcohol use: Yes    Alcohol/week: 0.0 standard drinks of alcohol    Comment: occasional   Drug use: No     Aurea Goodell B, NP 01/16/24 1107

## 2024-01-16 NOTE — Plan of Care (Signed)

## 2024-01-16 NOTE — Discharge Instructions (Addendum)
°  1. Atypical chest pain (Primary) - ED EKG completed in UC shows sinus tachycardia with premature atrial complexes, borderline EKG, no STEMI, ventricular rate of 103 bpm. - Based on presentation with chest pain and previous history of cardioversion and atrial fibrillation recommend further evaluation and treatment in the ER. - Please go directly to [facility] emergency room for further evaluation and treatment after leaving urgent care.

## 2024-01-16 NOTE — Assessment & Plan Note (Signed)
 No signs of exacerbation, as needed DuoNeb, as needed supplemental oxygen -Will monitor closely

## 2024-01-16 NOTE — ED Triage Notes (Signed)
 Pt c.o epigastric pain, n/v weakness and dark stools since yesterday. Pt takes eliquis 

## 2024-01-16 NOTE — Progress Notes (Signed)
 pt arrived to floor at 1606hrs with unit of blood issued at 1449hrs, not started in the ED, too late to transfuse. Blood bank contacted and unit returned to them. MD notified

## 2024-01-16 NOTE — Progress Notes (Signed)
 Pt admitted from the ED via stretcher n to [phone]. Bed in that room not operating properly so ordered another one and kept her in the room on the stretcher. Vitals taken and cardiac monitoring hooked up and CCMD notified. Family at bedside.

## 2024-01-16 NOTE — Telephone Encounter (Signed)
 FYI Only or Action Required?: FYI only for provider: Referred to UC.  Patient was last seen in primary care on 01/06/2024 by [name], MD.  Called Nurse Triage reporting Dizziness.  Symptoms began yesterday.  Interventions attempted: Rest, hydration, or home remedies.  Symptoms are: gradually improving.  Triage Disposition: See Physician Within 24 Hours  Patient/caregiver understands and will follow disposition?: Yes      Copied from CRM #[id number]. Topic: Clinical - Red Word Triage >> Jan 16, 2024  9:00 AM Tiffini S wrote: [facility] that prompted transfer to Nurse Triage: weakness, light headed, upper stomach and leg ache/ pain, dark stool, vomiting, little to no sleep- started a few days ago Reason for Disposition  [1] MODERATE dizziness (e.g., interferes with normal activities) AND [2] has NOT been evaluated by doctor (or NP/PA) for this  (Exception: Dizziness caused by heat exposure, sudden standing, or poor fluid intake.)  Answer Assessment - Initial Assessment Questions 1. DESCRIPTION: Describe your dizziness.     Lightheadedness, weakness x 1 day  2. LIGHTHEADED: Do you feel lightheaded? (e.g., somewhat faint, woozy, weak upon standing)     Yes  3. VERTIGO: Do you feel like either you or the room is spinning or tilting? (i.e., vertigo)     No   4. SEVERITY: How bad is it?  Do you feel like you are going to faint? Can you stand and walk?     Mild   5. ONSET:  When did the dizziness begin?     Yesterday  6. AGGRAVATING FACTORS: Does anything make it worse? (e.g., standing, change in head position)     No    8. CAUSE: What do you think is causing the dizziness? (e.g., decreased fluids or food, diarrhea, emotional distress, heat exposure, new medicine, sudden standing, vomiting; unknown)     Unsure   9. RECURRENT SYMPTOM: Have you had dizziness before? If Yes, ask: When was the last time? What happened that time?     No   10. OTHER  SYMPTOMS: Do you have any other symptoms? (e.g., fever, chest pain, vomiting, diarrhea, bleeding)  No    Patient called in with complaints of lightheadedness, upper abdominal pain since Yesterday. She mentioned her stool was darker than usual. She was also having nausea with vomiting x1 yesterday. The dizziness, and weakness has improved some today. Referred to UC for same day evaluation as there are no appointments within reasonable timeframe. She agrees with plan of care and will have someone take her there.  Protocols used: Dizziness - Lightheadedness-A-AH

## 2024-01-16 NOTE — Assessment & Plan Note (Signed)
 Last evaluation July 2024 Status post left lumpectomy Antiestrogen therapy with anastrozole  1 mg daily x 5 years started 10/09/2022-04/29/2023 (due to muscle aches and pains)

## 2024-01-16 NOTE — Assessment & Plan Note (Signed)
 Reviewing home meds currently not on any statins

## 2024-01-16 NOTE — H&P (View-Only) (Signed)
 Consultation Note   Referring Provider:   [facility] PCP: [name], MD Primary Gastroenterologist:  Dr. Unk ( [facility])       Reason for Consultation: GI bleed on Eliquis  DOA: 01/16/2024         Hospital Day: 1   ASSESSMENT    83 year old female admitted with   Upper GI bleed with melena   Symptomatic, acute blood loss anemia  Eliquis  use  Mildly tachycardic but otherwise hemodynamically stable . presenting hemoglobin 6.5, down from 12.3 three weeks ago.  Rule out erosive disease, PUD, gastrointestinal AVMs, gastrointestinal neoplasm  Atrial fibrillation, on Eliquis  Last dose of Eliquis  was morning of 01/15/2024  GERD Occasional dysphagia ( meat) Takes PPI at home  Hypertension Home BP meds on hold  COPD  History of left breast cancer, s/p left lumpectomy August 2024.  DCIS involving intraductal papilloma, all margins negative.  Takes anastrozole    See PMH for any additional medical history  / medical problems  Principal Problem:   GIB (gastrointestinal bleeding) Active Problems:   HTN (hypertension)   COPD (chronic obstructive pulmonary disease) (HCC)   GERD (gastroesophageal reflux disease)   Chronic hyponatremia   Pure hypercholesterolemia   Malignant neoplasm of upper-inner quadrant of left breast in female, estrogen receptor positive (HCC)   Persistent atrial fibrillation (HCC)   Hypercoagulable state due to persistent atrial fibrillation (HCC)    PLAN:   --Clear liquid diet given --N.p.o. after midnight --Continue twice daily IV pantoprazole --Monitor H&H  --2 units of RBCs already ordered --Schedule for EGD tomorrow. The risks and benefits of EGD with possible biopsies were discussed with the patient who agrees to proceed.  --Further recommendations following EGD   HPI   Brief History:  Patient presented to the ED today for evaluation of weakness .  She began having dark  stools several days ago .  Yesterday she had an episode of acute nonradiating epigastric pain , nausea and nonbloody emesis .  She does not take NSAIDs, uses Tylenol  for pain.  No further nausea, vomiting or abdominal pain today.  She has occasional constipation at home but does not ever see red blood in her stool.  Occasionally meat hangs up in her esophagus.  She takes Prilosec  as needed.  No other GI complaints  In the ED patient has been mildly tachycardic despite being on metoprolol  . HR 105, BP 120/50.   Hemoglobin 6.5, MCV 93, Sodium 130, BUN 36, FOBT positive    Pertinent GI Studies   Colonoscopy 2007 -no results found   Labs and Imaging:  Recent Labs    01/16/24 1204  PROT 5.2*  ALBUMIN 3.0*  AST 15  ALT 15  ALKPHOS 21*  BILITOT 0.4   Recent Labs    01/16/24 1204  WBC 11.9*  HGB 6.5*  HCT 20.0*  MCV 93.5  PLT 180   Recent Labs    01/16/24 1204  NA 130*  K 4.3  CL 99  CO2 23  GLUCOSE 114*  BUN 36*  CREATININE 0.66  CALCIUM  8.5*     Past Medical History:  Diagnosis Date   Arthritis    back, neck; right shoulder;    Asthma    using advair  prn   Cancer (HCC) 2024   Left Breast Cancer   Chicken pox    Dyspnea    Dysrhythmia 09/13/2022   Atrial Fibrillation   Hyperlipidemia    Hypertension    controlled with medication;    New onset of headache in cancer patient 11/17/2022   Peripheral vascular disease    mild dilation of aorta    Past Surgical History:  Procedure Laterality Date   BREAST BIOPSY Left 08/12/2022   US  LT BREAST BX W LOC DEV 1ST LESION IMG BX SPEC US  GUIDE 08/12/2022 [facility]   BREAST BIOPSY Left 08/30/2022   MM LT BREAST BX W LOC DEV EA AD LESION IMG BX SPEC STEREO GUIDE 08/30/2022 [facility]   BREAST BIOPSY Left 08/30/2022   MM LT BREAST BX W LOC DEV 1ST LESION IMAGE BX SPEC STEREO GUIDE 08/30/2022 [facility]   BREAST BIOPSY  09/18/2022   MM LT RADIOACTIVE SEED EA ADD LESION LOC MAMMO GUIDE 09/18/2022  [facility]   BREAST BIOPSY  09/18/2022   MM LT RADIOACTIVE SEED LOC MAMMO GUIDE 09/18/2022 [facility]   BREAST EXCISIONAL BIOPSY Left    BREAST LUMPECTOMY WITH RADIOACTIVE SEED LOCALIZATION Left 09/19/2022   Procedure: LEFT BREAST SEED BRACKETED LUMPECTOMY;  Surgeon: Aron Shoulders, MD;  Location: [facility] OR;  Service: General;  Laterality: Left;   CARDIOVERSION N/A 10/23/2022   Procedure: CARDIOVERSION;  Surgeon: Delford Maude BROCKS, MD;  Location: [facility];  Service: Cardiovascular;  Laterality: N/A;   COLONOSCOPY  2014   FOOT SURGERY Right 1973   cyst removed off right foot   TONSILLECTOMY AND ADENOIDECTOMY  1953    Family History  Problem Relation Age of Onset   Heart disease Mother    Heart disease Father    Arthritis Maternal Grandmother    Diabetes Neg Hx    Cancer Neg Hx    Stroke Neg Hx     Prior to Admission medications  Medication Sig Start Date End Date Taking? Authorizing Provider  acetaminophen  (TYLENOL ) 500 MG tablet Take 500 mg by mouth every 6 (six) hours as needed.    [provider]  acetaminophen  (TYLENOL ) 650 MG CR tablet Take 2 tablets by mouth 2 (two) times daily.    [provider]  albuterol  (VENTOLIN  HFA) 108 (90 Base) MCG/ACT inhaler Inhale 2 puffs into the lungs every 6 (six) hours as needed for wheezing or shortness of breath. 02/28/22   [name], MD  amLODipine  (NORVASC ) 2.5 MG tablet Take 1 tablet (2.5 mg total) by mouth daily. 12/24/23   [name], MD  anastrozole  (ARIMIDEX ) 1 MG tablet TAKE 1 TABLET BY MOUTH EVERY DAY 09/28/23   [name], MD  apixaban  (ELIQUIS ) 5 MG TABS tablet TAKE 1 TABLET BY MOUTH TWICE A DAY 12/09/23   [name], MD  denosumab  (PROLIA ) 60 MG/ML SOSY injection Inject 60 mg into the skin every 6 (six) months.    [provider]  fluticasone  (FLONASE ) 50 MCG/ACT nasal spray Place 2 sprays into both nostrils daily. 01/06/24   [name], MD  metoprolol  succinate  (TOPROL -XL) 25 MG 24 hr tablet TAKE 1 TABLET (25 MG TOTAL) BY MOUTH DAILY. 12/09/23   [name], MD  omeprazole  (PRILOSEC  OTC) 20 MG tablet Take 1 tablet (20 mg total) by mouth daily. In the morning on an empty stomach Patient taking differently: Take 20 mg by mouth daily as needed. In the morning on an empty stomach 06/05/22  [name], MD  telmisartan  (MICARDIS ) 80 MG tablet TAKE 1 TABLET BY MOUTH EVERY DAY 12/05/23   Tullo, Teresa L, MD  trolamine salicylate (ASPERCREME) 10 % cream Apply 1 Application topically as needed for muscle pain.    [provider]  NAPOLEON FRIDGE 250-50 MCG/ACT AEPB INHALE 1 PUFF INTO THE LUNGS IN THE MORNING AND AT BEDTIME. 01/07/24   [name], MD    Current Facility-Administered Medications  Medication Dose Route Frequency Provider Last Rate Last Admin   0.9 %  sodium chloride  infusion (Manually program via Guardrails IV Fluids)   Intravenous Once Prosperi, Christian H, PA-C       0.9 %  sodium chloride  infusion   Intravenous Continuous Shahmehdi, Seyed A, MD       acetaminophen  (TYLENOL ) tablet 650 mg  650 mg Oral Q6H PRN Shahmehdi, Seyed A, MD       Or   acetaminophen  (TYLENOL ) suppository 650 mg  650 mg Rectal Q6H PRN Shahmehdi, Seyed A, MD       bisacodyl (DULCOLAX) EC tablet 5 mg  5 mg Oral Daily PRN Willette Adriana LABOR, MD       [START ON 02/21/2024] denosumab  (PROLIA ) injection 60 mg  60 mg Subcutaneous Q6 months Tullo, Teresa L, MD       hydrALAZINE (APRESOLINE) injection 10 mg  10 mg Intravenous Q4H PRN Shahmehdi, Seyed A, MD       HYDROmorphone (DILAUDID) injection 0.5-1 mg  0.5-1 mg Intravenous Q2H PRN Shahmehdi, Seyed A, MD       ipratropium (ATROVENT) nebulizer solution 0.5 mg  0.5 mg Nebulization Q6H PRN Shahmehdi, Seyed A, MD       metoprolol  succinate (TOPROL -XL) 24 hr tablet 25 mg  25 mg Oral Daily Shahmehdi, Seyed A, MD       ondansetron  (ZOFRAN ) tablet 4 mg  4 mg Oral Q6H PRN Shahmehdi, Seyed A, MD       Or   ondansetron   (ZOFRAN ) injection 4 mg  4 mg Intravenous Q6H PRN Shahmehdi, Seyed A, MD       oxyCODONE  (Oxy IR/ROXICODONE ) immediate release tablet 5 mg  5 mg Oral Q4H PRN Shahmehdi, Seyed A, MD       pantoprazole (PROTONIX) injection 40 mg  40 mg Intravenous Q12H Prosperi, Christian H, PA-C       senna-docusate (Senokot-S) tablet 1 tablet  1 tablet Oral QHS PRN Shahmehdi, Seyed A, MD       sodium phosphate  (FLEET) enema 1 enema  1 enema Rectal Once PRN Shahmehdi, Seyed A, MD       traZODone (DESYREL) tablet 25 mg  25 mg Oral QHS PRN Willette Adriana LABOR, MD       Current Outpatient Medications  Medication Sig Dispense Refill   acetaminophen  (TYLENOL ) 500 MG tablet Take 500 mg by mouth every 6 (six) hours as needed.     acetaminophen  (TYLENOL ) 650 MG CR tablet Take 2 tablets by mouth 2 (two) times daily.     albuterol  (VENTOLIN  HFA) 108 (90 Base) MCG/ACT inhaler Inhale 2 puffs into the lungs every 6 (six) hours as needed for wheezing or shortness of breath. 3.7 g 11   amLODipine  (NORVASC ) 2.5 MG tablet Take 1 tablet (2.5 mg total) by mouth daily. 90 tablet 1   anastrozole  (ARIMIDEX ) 1 MG tablet TAKE 1 TABLET BY MOUTH EVERY DAY 90 tablet 3   apixaban  (ELIQUIS ) 5 MG TABS tablet TAKE 1 TABLET BY MOUTH TWICE A DAY 180 tablet 1  denosumab  (PROLIA ) 60 MG/ML SOSY injection Inject 60 mg into the skin every 6 (six) months.     fluticasone  (FLONASE ) 50 MCG/ACT nasal spray Place 2 sprays into both nostrils daily. 16 g 6   metoprolol  succinate (TOPROL -XL) 25 MG 24 hr tablet TAKE 1 TABLET (25 MG TOTAL) BY MOUTH DAILY. 90 tablet 1   omeprazole  (PRILOSEC  OTC) 20 MG tablet Take 1 tablet (20 mg total) by mouth daily. In the morning on an empty stomach (Patient taking differently: Take 20 mg by mouth daily as needed. In the morning on an empty stomach) 90 tablet 1   telmisartan  (MICARDIS ) 80 MG tablet TAKE 1 TABLET BY MOUTH EVERY DAY 90 tablet 1   trolamine salicylate (ASPERCREME) 10 % cream Apply 1 Application topically as  needed for muscle pain.     WIXELA INHUB 250-50 MCG/ACT AEPB INHALE 1 PUFF INTO THE LUNGS IN THE MORNING AND AT BEDTIME. 60 each 1    Allergies as of 01/16/2024 - Review Complete 01/16/2024  Allergen Reaction Noted   Ace inhibitors Cough 03/10/2013   Carvedilol   02/05/2022   Codeine Nausea Only 04/18/2014   Kenalog  [triamcinolone  acetonide] Other (See Comments) 11/07/2020    Social History   Socioeconomic History   Marital status: Widowed    Spouse name: Not on file   Number of children: Not on file   Years of education: Not on file   Highest education level: Not on file  Occupational History   Not on file  Tobacco Use   Smoking status: Former    Current packs/day: 0.00    Types: Cigarettes    Quit date: 04/17/2009    Years since quitting: 14.7   Smokeless tobacco: Never   Tobacco comments:    Former smoker 09/26/22  Vaping Use   Vaping status: Never Used  Substance and Sexual Activity   Alcohol use: Yes    Alcohol/week: 0.0 standard drinks of alcohol    Comment: occasional   Drug use: No   Sexual activity: Never  Other Topics Concern   Not on file  Social History Narrative   Not on file   Social Drivers of Health   Tobacco Use: Medium Risk (01/16/2024)   Patient History    Smoking Tobacco Use: Former    Smokeless Tobacco Use: Never    Passive Exposure: Not on file  Financial Resource Strain: Low Risk (12/22/2023)   Overall Financial Resource Strain (CARDIA)    Difficulty of Paying Living Expenses: Not hard at all  Food Insecurity: No Food Insecurity (12/22/2023)   Epic    Worried About Programme Researcher, Broadcasting/film/video in the Last Year: Never true    Ran Out of Food in the Last Year: Never true  Transportation Needs: No Transportation Needs (12/22/2023)   Epic    Lack of Transportation (Medical): No    Lack of Transportation (Non-Medical): No  Physical Activity: Inactive (12/22/2023)   Exercise Vital Sign    Days of Exercise per Week: 0 days    Minutes of Exercise  per Session: 0 min  Stress: No Stress Concern Present (12/22/2023)   [employer] of Occupational Health - Occupational Stress Questionnaire    Feeling of Stress: Not at all  Social Connections: Moderately Integrated (12/22/2023)   Social Connection and Isolation Panel    Frequency of Communication with Friends and Family: More than three times a week    Frequency of Social Gatherings with Friends and Family: More than three times a week  Attends Religious Services: More than 4 times per year    Active Member of Clubs or Organizations: Yes    Attends Banker Meetings: More than 4 times per year    Marital Status: Widowed  Intimate Partner Violence: Not At Risk (12/22/2023)   Epic    Fear of Current or Ex-Partner: No    Emotionally Abused: No    Physically Abused: No    Sexually Abused: No  Depression (PHQ2-9): Low Risk (01/06/2024)   Depression (PHQ2-9)    PHQ-2 Score: 0  Alcohol Screen: Low Risk (12/22/2023)   Alcohol Screen    Last Alcohol Screening Score (AUDIT): 1  Housing: Low Risk (12/22/2023)   Epic    Unable to Pay for Housing in the Last Year: No    Number of Times Moved in the Last Year: 0    Homeless in the Last Year: No  Utilities: Not At Risk (12/22/2023)   Epic    Threatened with loss of utilities: No  Health Literacy: Adequate Health Literacy (12/22/2023)   B1300 Health Literacy    Frequency of need for help with medical instructions: Never     Code Status   Code Status: Full Code  Review of Systems: All systems reviewed and negative except where noted in HPI.  Physical Exam: Vital signs in last 24 hours: Temp:  [97.7 F (36.5 C)-98.2 F (36.8 C)] 98.2 F (36.8 C) (12/12 1114) Pulse Rate:  [50-103] 50 (12/12 1400) Resp:  [19-24] 24 (12/12 1400) BP: (110-138)/(50-101) 120/50 (12/12 1400) SpO2:  [100 %] 100 % (12/12 1400) Weight:  [67.6 kg] 67.6 kg (12/12 1121)    General:  Pleasant female in NAD Psych:  Cooperative. Normal  mood and affect Eyes: Pupils equal Ears:  Normal auditory acuity Nose: No deformity, discharge or lesions Neck:  Supple, no masses felt Lungs:  Clear to auscultation.  Heart: Slightly tachycardic, irregular rhythm.  Murmur is present.  Abdomen:  Soft, nondistended, nontender, active bowel sounds, no masses felt Rectal :  Deferred Msk: Symmetrical without gross deformities.  Neurologic:  Alert, oriented, grossly normal neurologically Extremities : No edema Skin:  Intact without significant lesions.    Intake/Output from previous day: No intake/output data recorded. Intake/Output this shift:  No intake/output data recorded.   Vina Dasen, NP-C   01/16/2024, 2:46 PM

## 2024-01-16 NOTE — ED Triage Notes (Signed)
 PT presents today with CP that started last night. Pt reports she has had nausea,epi gastric pan and vomited x one . Pt also reports dark stools .

## 2024-01-16 NOTE — Assessment & Plan Note (Signed)
 Continue IV Protonix

## 2024-01-16 NOTE — ED Notes (Signed)
 Pt's blood just received from blood bank. Pt has ready room upstairs, so blood will be sent upstairs with pt to be started.

## 2024-01-16 NOTE — ED Notes (Signed)
 Got patient into a gown on the monitor took patient to the bathroom patient is now back on the monitor call bell in reach and family at bedside

## 2024-01-16 NOTE — Assessment & Plan Note (Signed)
 Hemorrhage anemia-due to GI bleed in the setting of Eliquis  -Hemoglobin 6.5 down from 12.3 in 12/22/23 - 2U PRBC has been ordered by EDP to be transfused - Follow-up with H&H every 6 hours - Currently hemodynamically stable

## 2024-01-16 NOTE — Assessment & Plan Note (Signed)
 On Eliquis  for atrial fibrillation -Eliquis  on hold due to GI bleed

## 2024-01-16 NOTE — TOC CM/SW Note (Signed)
 TOC consult received for d/c planning needs. Follow-up to be completed with patient as appropriate.   Merilee Batty, MSN, RN Case Management [phone]

## 2024-01-16 NOTE — Assessment & Plan Note (Signed)
-  Continue metoprolol  -on Eliquis , holding for acute GI bleed

## 2024-01-16 NOTE — ED Provider Notes (Signed)
 [facility] Provider Note   CSN: [id number] Arrival date & time: 01/16/24  1108     Patient presents with: Abdominal Pain, Melena, and Weakness   Lacey Brown is a 83 y.o. female with past medical history significant for hypertension, COPD, GERD, hyperlipidemia, persistent A-fib on Eliquis  who presents with concern for moderate epigastric pain, nausea, vomiting, weakness, and dark tarry stools for the last 2 to 3 days.  She reports previous history of ulcer remotely.  No previous history of GI bleed that she knows about.    Abdominal Pain Weakness Associated symptoms: abdominal pain        Prior to Admission medications  Medication Sig Start Date End Date Taking? Authorizing Provider  acetaminophen  (TYLENOL ) 500 MG tablet Take 500 mg by mouth every 6 (six) hours as needed.    [provider]  acetaminophen  (TYLENOL ) 650 MG CR tablet Take 2 tablets by mouth 2 (two) times daily.    [provider]  albuterol  (VENTOLIN  HFA) 108 (90 Base) MCG/ACT inhaler Inhale 2 puffs into the lungs every 6 (six) hours as needed for wheezing or shortness of breath. 02/28/22   [name], MD  amLODipine  (NORVASC ) 2.5 MG tablet Take 1 tablet (2.5 mg total) by mouth daily. 12/24/23   [name], MD  anastrozole  (ARIMIDEX ) 1 MG tablet TAKE 1 TABLET BY MOUTH EVERY DAY 09/28/23   [name], MD  apixaban  (ELIQUIS ) 5 MG TABS tablet TAKE 1 TABLET BY MOUTH TWICE A DAY 12/09/23   [name], MD  denosumab  (PROLIA ) 60 MG/ML SOSY injection Inject 60 mg into the skin every 6 (six) months.    [provider]  fluticasone  (FLONASE ) 50 MCG/ACT nasal spray Place 2 sprays into both nostrils daily. 01/06/24   [name], MD  metoprolol  succinate (TOPROL -XL) 25 MG 24 hr tablet TAKE 1 TABLET (25 MG TOTAL) BY MOUTH DAILY. 12/09/23   [name], MD  omeprazole  (PRILOSEC  OTC) 20 MG tablet Take 1 tablet (20 mg total) by mouth  daily. In the morning on an empty stomach Patient taking differently: Take 20 mg by mouth daily as needed. In the morning on an empty stomach 06/05/22   [name], MD  telmisartan  (MICARDIS ) 80 MG tablet TAKE 1 TABLET BY MOUTH EVERY DAY 12/05/23   Tullo, Teresa L, MD  trolamine salicylate (ASPERCREME) 10 % cream Apply 1 Application topically as needed for muscle pain.    [provider]  NAPOLEON FRIDGE 250-50 MCG/ACT AEPB INHALE 1 PUFF INTO THE LUNGS IN THE MORNING AND AT BEDTIME. 01/07/24   [name], MD    Allergies: Ace inhibitors, Carvedilol , Codeine, and Kenalog  [triamcinolone  acetonide]    Review of Systems  Gastrointestinal:  Positive for abdominal pain.  Neurological:  Positive for weakness.  All other systems reviewed and are negative.   Updated Vital Signs BP (!) 120/50   Pulse (!) 50   Temp 98.2 F (36.8 C)   Resp (!) 24   Ht 5' (1.524 m)   Wt 67.6 kg   SpO2 100%   BMI 29.10 kg/m   Physical Exam Vitals and nursing note reviewed.  Constitutional:      General: She is not in acute distress.    Appearance: Normal appearance.  HENT:     Head: Normocephalic and atraumatic.     Mouth/Throat:     Comments: Pale mucous membranes Eyes:     General:  Right eye: No discharge.        Left eye: No discharge.  Cardiovascular:     Rate and Rhythm: Normal rate and regular rhythm.     Heart sounds: No murmur heard.    No friction rub. No gallop.  Pulmonary:     Effort: Pulmonary effort is normal.     Breath sounds: Normal breath sounds.  Abdominal:     General: Bowel sounds are normal.     Palpations: Abdomen is soft.     Comments: Mild tenderness to palpation epigastric region, no rebound, rigidity, guarding, no abdominal distention noted  Genitourinary:    Comments: Grossly melanotic stool on rectal exam Skin:    General: Skin is warm and dry.     Capillary Refill: Capillary refill takes less than 2 seconds.  Neurological:     Mental Status:  She is alert and oriented to person, place, and time.  Psychiatric:        Mood and Affect: Mood normal.        Behavior: Behavior normal.     (all labs ordered are listed, but only abnormal results are displayed) Labs Reviewed  CBC WITH DIFFERENTIAL/PLATELET - Abnormal; Notable for the following components:      Result Value   WBC 11.9 (*)    RBC 2.14 (*)    Hemoglobin 6.5 (*)    HCT 20.0 (*)    Neutro Abs 8.8 (*)    Monocytes Absolute 1.3 (*)    Abs Immature Granulocytes 0.20 (*)    All other components within normal limits  COMPREHENSIVE METABOLIC PANEL WITH GFR - Abnormal; Notable for the following components:   Sodium 130 (*)    Glucose, Bld 114 (*)    BUN 36 (*)    Calcium  8.5 (*)    Total Protein 5.2 (*)    Albumin 3.0 (*)    Alkaline Phosphatase 21 (*)    All other components within normal limits  PROTIME-INR - Abnormal; Notable for the following components:   Prothrombin Time 17.2 (*)    INR 1.3 (*)    All other components within normal limits  POC OCCULT BLOOD, ED - Abnormal; Notable for the following components:   Fecal Occult Bld POSITIVE (*)    All other components within normal limits  EXPECTORATED SPUTUM ASSESSMENT W GRAM STAIN, RFLX TO RESP C  MAGNESIUM   PHOSPHORUS  HEMOGLOBIN AND HEMATOCRIT, BLOOD  HEMOGLOBIN AND HEMATOCRIT, BLOOD  HEMOGLOBIN AND HEMATOCRIT, BLOOD  IRON AND TIBC  VITAMIN B12  FOLATE  TYPE AND SCREEN  ABO/RH  PREPARE RBC (CROSSMATCH)    EKG: None  Radiology: No results found.   .Critical Care  Performed by: Rosan Sherlean DEL, PA-C Authorized by: Rosan Sherlean DEL, PA-C   Critical care provider statement:    Critical care time (minutes):  35   Critical care was necessary to treat or prevent imminent or life-threatening deterioration of the following conditions:  Circulatory failure (critical anemia requiring transfusion)   Critical care was time spent personally by me on the following activities:  Development of  treatment plan with patient or surrogate, discussions with consultants, evaluation of patient's response to treatment, examination of patient, ordering and review of laboratory studies, ordering and review of radiographic studies, ordering and performing treatments and interventions, pulse oximetry, re-evaluation of patient's condition and review of old charts   Care discussed with: admitting provider      Medications Ordered in the ED  0.9 %  sodium chloride  infusion (  Manually program via Guardrails IV Fluids) (has no administration in time range)  pantoprazole (PROTONIX) injection 40 mg (has no administration in time range)  0.9 %  sodium chloride  infusion (has no administration in time range)  acetaminophen  (TYLENOL ) tablet 650 mg (has no administration in time range)    Or  acetaminophen  (TYLENOL ) suppository 650 mg (has no administration in time range)  oxyCODONE  (Oxy IR/ROXICODONE ) immediate release tablet 5 mg (has no administration in time range)  HYDROmorphone (DILAUDID) injection 0.5-1 mg (has no administration in time range)  traZODone (DESYREL) tablet 25 mg (has no administration in time range)  senna-docusate (Senokot-S) tablet 1 tablet (has no administration in time range)  bisacodyl (DULCOLAX) EC tablet 5 mg (has no administration in time range)  sodium phosphate  (FLEET) enema 1 enema (has no administration in time range)  ondansetron  (ZOFRAN ) tablet 4 mg (has no administration in time range)    Or  ondansetron  (ZOFRAN ) injection 4 mg (has no administration in time range)  ipratropium (ATROVENT) nebulizer solution 0.5 mg (has no administration in time range)  hydrALAZINE (APRESOLINE) injection 10 mg (has no administration in time range)  metoprolol  succinate (TOPROL -XL) 24 hr tablet 25 mg (has no administration in time range)  anastrozole  (ARIMIDEX ) tablet 1 mg (has no administration in time range)  pantoprazole (PROTONIX) injection 40 mg (40 mg Intravenous Given 01/16/24 1209)                                     Medical Decision Making Amount and/or Complexity of Data Reviewed Labs: ordered.  Risk Prescription drug management.   This patient is a 83 y.o. female who presents to the ED for concern of weakness, abdominal pain, this involves an extensive number of treatment options, and is a complaint that carries with it a high risk of complications and morbidity. The emergent differential diagnosis prior to evaluation includes, but is not limited to,  CVA, spinal cord injury, ACS, arrhythmia, syncope, orthostatic hypotension, sepsis, hypoglycemia, hypoxia, electrolyte disturbance, endocrine disorder, anemia, environmental exposure, polypharmacy, The causes of generalized abdominal pain include but are not limited to AAA, mesenteric ischemia, appendicitis, diverticulitis, DKA, gastritis, gastroenteritis, AMI, nephrolithiasis, pancreatitis, peritonitis, adrenal insufficiency,lead poisoning, iron toxicity, intestinal ischemia, constipation, UTI,SBO/LBO, splenic rupture, biliary disease, IBD, IBS, PUD, or hepatitis . This is not an exhaustive differential.   Past Medical History / Co-morbidities / Social History: hypertension, COPD, GERD, hyperlipidemia, persistent A-fib on Eliquis    Additional history: Chart reviewed. Pertinent results include: Reviewed outpatient family medicine visits, including recent lab work, notably her hemoglobin was 12.5 just 3 weeks ago  Physical Exam: Physical exam performed. The pertinent findings include: Vital signs overall stable, she had slightly low blood pressure on arrival, 110/50, improved to 120/50 on recheck.  Intermittent tachycardia with A-fib rhythm, mild tachypnea, respirations 24. Mild tenderness to palpation epigastric region, no rebound, rigidity, guarding, no abdominal distention noted. Pale mucous membranes   Lab Tests: I ordered, and personally interpreted labs.  The pertinent results include: CBC notable for mild  leukocytosis, blood cell 11.9, critical new anemia, hemoglobin 6.5 from recent baseline of 12.5.  CMP notable for hyponatremia, sodium 130, elevated BUN, 36 is compatible with acute GI bleed.  Her Hemoccult is positive.   Cardiac Monitoring:  The patient was maintained on a cardiac monitor.  My attending physician Dr. Ginger viewed and interpreted the cardiac monitored which showed an underlying rhythm of: Afib. I  agree with this interpretation.   Medications: I ordered medication including Protonix for GI bleed, blood transfusion for critical anemia which is symptomatic in nature  Consultations Obtained: I requested consultation with the hospitalist spoke with to Dr. Willette,  and discussed lab and imaging findings as well as pertinent plan - they recommend: Hospital admission for symptomatic anemia, spoke with GI, Vina Dasen NP, who agrees with plan, recommends keeping her NPO at this time   Disposition: After consideration of the diagnostic results and the patients response to treatment, I feel that patient would benefit from admission for symptomatic anemia requiring transfusion from upper GI bleed.   I discussed this case with my attending physician Dr. Ginger who cosigned this note including patient's presenting symptoms, physical exam, and planned diagnostics and interventions. Attending physician stated agreement with plan or made changes to plan which were implemented.     Final diagnoses:  Acute gastritis, presence of bleeding unspecified, unspecified gastritis type    ED Discharge Orders     None          Rosan Sherlean DEL, PA-C 01/16/24 1450    Tegeler, Lonni PARAS, MD 01/16/24 1531

## 2024-01-16 NOTE — Assessment & Plan Note (Signed)
 Chronic hyponatremia Serum sodium level fluctuate between 132-130 - Today serum sodium level at 130 -Will continue monitor closely

## 2024-01-16 NOTE — H&P (Signed)
 History and Physical   Patient: Lacey Brown                            PCP: [name], MD                    DOB: 08-30-1940            DOA: 01/16/2024 FMW:[id number]             DOS: 01/16/2024, 2:52 PM  [name], MD  Patient coming from:   HOME  I have personally reviewed patient's medical records, in electronic medical records, including:  [facility], and care everywhere.    Chief Complaint:   Chief Complaint  Patient presents with   Abdominal Pain   Melena   Weakness    History of present illness:    Korissa Horsford is a 83 year old female with extensive history of A-fib on Eliquis , GERD, HLD, HTN, PVD, arthritis, left breast cancer... Presented with chief complaint of abdominal pain, generalized weaknesses.  Denies any chest pain or shortness of breath, denies any dizziness.  Denies of having any recent illnesses.   ED Evaluation: Blood pressure (!) 110/50, pulse 96, temperature 98.2 F (36.8 C), resp. rate 19, height 5' (1.524 m), weight 67.6 kg, SpO2 100%. Labs: WBC 11.9, hemoglobin 6.5 (down from 12.3 in 12/22/23), PT 17.2, INR 1.3, Hemoccult positive x 2  EDP consulted GI, ordered 2 U PRBC to be transfused.  Patient Denies having: Fever, Chills, Cough, SOB, Chest Pain, Abd pain, N/V/D, headache, dizziness, lightheadedness,  Dysuria, Joint pain, rash, open wounds  Review of Systems: As per HPI, otherwise 10 point review of systems were negative.   ----------------------------------------------------------------------------------------------------------------------  Allergies[1]  Home MEDs:  Prior to Admission medications  Medication Sig Start Date End Date Taking? Authorizing Provider  acetaminophen  (TYLENOL ) 500 MG tablet Take 500 mg by mouth every 6 (six) hours as needed.    [provider]  acetaminophen  (TYLENOL ) 650 MG CR tablet Take 2 tablets by mouth 2 (two) times daily.    [provider]  albuterol   (VENTOLIN  HFA) 108 (90 Base) MCG/ACT inhaler Inhale 2 puffs into the lungs every 6 (six) hours as needed for wheezing or shortness of breath. 02/28/22   [name], MD  amLODipine  (NORVASC ) 2.5 MG tablet Take 1 tablet (2.5 mg total) by mouth daily. 12/24/23   [name], MD  anastrozole  (ARIMIDEX ) 1 MG tablet TAKE 1 TABLET BY MOUTH EVERY DAY 09/28/23   [name], MD  apixaban  (ELIQUIS ) 5 MG TABS tablet TAKE 1 TABLET BY MOUTH TWICE A DAY 12/09/23   [name], MD  denosumab  (PROLIA ) 60 MG/ML SOSY injection Inject 60 mg into the skin every 6 (six) months.    [provider]  fluticasone  (FLONASE ) 50 MCG/ACT nasal spray Place 2 sprays into both nostrils daily. 01/06/24   [name], MD  metoprolol  succinate (TOPROL -XL) 25 MG 24 hr tablet TAKE 1 TABLET (25 MG TOTAL) BY MOUTH DAILY. 12/09/23   [name], MD  omeprazole  (PRILOSEC  OTC) 20 MG tablet Take 1 tablet (20 mg total) by mouth daily. In the morning on an empty stomach Patient taking differently: Take 20 mg by mouth daily as needed. In the morning on an empty stomach 06/05/22   [name], MD  telmisartan  (MICARDIS ) 80 MG tablet TAKE 1 TABLET BY MOUTH EVERY DAY 12/05/23  Tullo, Teresa L, MD  trolamine salicylate (ASPERCREME) 10 % cream Apply 1 Application topically as needed for muscle pain.    [provider]  NAPOLEON INHUB 250-50 MCG/ACT AEPB INHALE 1 PUFF INTO THE LUNGS IN THE MORNING AND AT BEDTIME. 01/07/24   [name], MD    PRN MEDs: acetaminophen  **OR** acetaminophen , bisacodyl, hydrALAZINE, HYDROmorphone (DILAUDID) injection, ipratropium, ondansetron  **OR** ondansetron  (ZOFRAN ) IV, oxyCODONE , senna-docusate, sodium phosphate , traZODone  Past Medical History:  Diagnosis Date   Arthritis    back, neck; right shoulder;    Asthma    using advair prn   Cancer (HCC) 2024   Left Breast Cancer   Chicken pox    Dyspnea    Dysrhythmia 09/13/2022   Atrial Fibrillation   Hyperlipidemia     Hypertension    controlled with medication;    New onset of headache in cancer patient 11/17/2022   Peripheral vascular disease    mild dilation of aorta    Past Surgical History:  Procedure Laterality Date   BREAST BIOPSY Left 08/12/2022   US  LT BREAST BX W LOC DEV 1ST LESION IMG BX SPEC US  GUIDE 08/12/2022 [facility]   BREAST BIOPSY Left 08/30/2022   MM LT BREAST BX W LOC DEV EA AD LESION IMG BX SPEC STEREO GUIDE 08/30/2022 [facility]   BREAST BIOPSY Left 08/30/2022   MM LT BREAST BX W LOC DEV 1ST LESION IMAGE BX SPEC STEREO GUIDE 08/30/2022 [facility]   BREAST BIOPSY  09/18/2022   MM LT RADIOACTIVE SEED EA ADD LESION LOC MAMMO GUIDE 09/18/2022 [facility]   BREAST BIOPSY  09/18/2022   MM LT RADIOACTIVE SEED LOC MAMMO GUIDE 09/18/2022 [facility]   BREAST EXCISIONAL BIOPSY Left    BREAST LUMPECTOMY WITH RADIOACTIVE SEED LOCALIZATION Left 09/19/2022   Procedure: LEFT BREAST SEED BRACKETED LUMPECTOMY;  Surgeon: Aron Shoulders, MD;  Location: [facility] OR;  Service: General;  Laterality: Left;   CARDIOVERSION N/A 10/23/2022   Procedure: CARDIOVERSION;  Surgeon: Delford Maude BROCKS, MD;  Location: [facility]VAS[facility];  Service: Cardiovascular;  Laterality: N/A;   COLONOSCOPY  2014   FOOT SURGERY Right 1973   cyst removed off right foot   TONSILLECTOMY AND ADENOIDECTOMY  1953     reports that she quit smoking about 14 years ago. Her smoking use included cigarettes. She has never used smokeless tobacco. She reports current alcohol use. She reports that she does not use drugs.   Family History  Problem Relation Age of Onset   Heart disease Mother    Heart disease Father    Arthritis Maternal Grandmother    Diabetes Neg Hx    Cancer Neg Hx    Stroke Neg Hx     Physical Exam:   Vitals:   01/16/24 1114 01/16/24 1121 01/16/24 1125 01/16/24 1400  BP: (!) 110/50   (!) 120/50  Pulse: 96   (!) 50  Resp: 19   (!) 24  Temp: 98.2 F (36.8 C)     SpO2:   100% 100%   Weight:  67.6 kg    Height:  5' (1.524 m)     Constitutional: NAD, calm, comfortable Eyes: PERRL, lids and conjunctivae normal ENMT: Mucous membranes are moist. Posterior pharynx clear of any exudate or lesions.Normal dentition.  Neck: normal, supple, no masses, no thyromegaly Respiratory: clear to auscultation bilaterally, no wheezing, no crackles. Normal respiratory effort. No accessory muscle use.  Cardiovascular: Regular rate and rhythm, no murmurs / rubs / gallops. No  extremity edema. 2+ pedal pulses. No carotid bruits.  Abdomen: no tenderness, no masses palpated. No hepatosplenomegaly. Bowel sounds positive.  Musculoskeletal: no clubbing / cyanosis. No joint deformity upper and lower extremities. Good ROM, no contractures. Normal muscle tone.  Neurologic: CN II-XII grossly intact. Sensation intact, DTR normal. Strength 5/5 in all 4.  Psychiatric: Normal judgment and insight. Alert and oriented x 3. Normal mood.  Skin: no rashes, lesions, ulcers. No induration          Labs on admission:    I have personally reviewed following labs and imaging studies  CBC: Recent Labs  Lab 01/16/24 1204  WBC 11.9*  NEUTROABS 8.8*  HGB 6.5*  HCT 20.0*  MCV 93.5  PLT 180   Basic Metabolic Panel: Recent Labs  Lab 01/16/24 1204  NA 130*  K 4.3  CL 99  CO2 23  GLUCOSE 114*  BUN 36*  CREATININE 0.66  CALCIUM  8.5*   GFR: Estimated Creatinine Clearance: 44.9 mL/min (by C-G formula based on SCr of 0.66 mg/dL). Liver Function Tests: Recent Labs  Lab 01/16/24 1204  AST 15  ALT 15  ALKPHOS 21*  BILITOT 0.4  PROT 5.2*  ALBUMIN 3.0*   No results for input(s): LIPASE, AMYLASE in the last 168 hours. No results for input(s): AMMONIA in the last 168 hours. Coagulation Profile: Recent Labs  Lab 01/16/24 1204  INR 1.3*     Last A1C:  Lab Results  Component Value Date   HGBA1C 6.2 06/11/2023     Radiologic Exams on Admission:   No results found.  EKG:    Independently reviewed.  Orders placed or performed during the hospital encounter of 01/16/24   EKG 12-Lead   ---------------------------------------------------------------------------------------------------------------------------------------    Assessment / Plan:   Principal Problem:   GIB (gastrointestinal bleeding) Active Problems:   GERD (gastroesophageal reflux disease)   Hypercoagulable state due to persistent atrial fibrillation (HCC)   Acute post-hemorrhagic anemia   HTN (hypertension)   COPD (chronic obstructive pulmonary disease) (HCC)   Chronic hyponatremia   Pure hypercholesterolemia   Malignant neoplasm of upper-inner quadrant of left breast in female, estrogen receptor positive (HCC)   Persistent atrial fibrillation (HCC)   Assessment and Plan: * GIB (gastrointestinal bleeding) - Acute GI bleed, with hemorrhagic anemia In the setting of Eliquis  -Hemoccult positive x 2 -Hemoglobin 6.5 -Holding Eliquis , IV Protonix 40 mg twice daily -On clear liquid diet, n.p.o. after midnight -GI consulted, pending evaluation recommendations  Acute post-hemorrhagic anemia Hemorrhage anemia-due to GI bleed in the setting of Eliquis  -Hemoglobin 6.5 down from 12.3 in 12/22/23 - 2U PRBC has been ordered by EDP to be transfused - Follow-up with H&H every 6 hours - Currently hemodynamically stable  Hypercoagulable state due to persistent atrial fibrillation (HCC) On Eliquis  for atrial fibrillation -Eliquis  on hold due to GI bleed  GERD (gastroesophageal reflux disease) Continue IV Protonix  Chronic hyponatremia Chronic hyponatremia Serum sodium level fluctuate between 132-130 - Today serum sodium level at 130 -Will continue monitor closely  COPD (chronic obstructive pulmonary disease) (HCC) No signs of exacerbation, as needed DuoNeb, as needed supplemental oxygen -Will monitor closely  HTN (hypertension) Due to hypovolemia, acute GI bleed anemia -Monitoring  BP closely-Will hold BP meds including Norvasc , If blood pressure remains stable, will continue metoprolol  for rate control  Pure hypercholesterolemia Reviewing home meds currently not on any statins  Persistent atrial fibrillation (HCC) -Continue metoprolol  -on Eliquis , holding for acute GI bleed   Malignant neoplasm of upper-inner quadrant of  left breast in female, estrogen receptor positive [facility]) Last evaluation July 2024 Status post left lumpectomy Antiestrogen therapy with anastrozole  1 mg daily x 5 years started 10/09/2022-04/29/2023 (due to muscle aches and pains)      Consults called: Gastroenterologist -------------------------------------------------------------------------------------------------------------------------------- DVT prophylaxis:  SCDs Start: 01/16/24 1428   Code Status:   Code Status: Full Code   Admission status: Patient will be admitted as Observation, with a greater than 2 midnight length of stay. Level of care: Telemetry   Family Communication:  none at bedside  (The above findings and plan of care has been discussed with patient in detail, the patient expressed understanding and agreement of above plan)  --------------------------------------------------------------------------------------------------------------------------------  Disposition Plan:  Anticipated 1-2 days Status is: Observation The patient remains OBS appropriate and will d/c before 2 midnights.  -------------------------------------------------------------------------------------------------------------------------------  Time spent:  56  Min.  Was spent seeing and evaluating the patient, reviewing all medical records, drawn plan of care.  SIGNED: Adriana DELENA Grams, MD, FHM. FAAFP. [place] [facility], Pager  (Please use [email or web address] to page/ or secure chat through epic) If 7PM-7AM, please contact night-coverage [email or web address],  01/16/2024, 2:52 PM     [1]   Allergies Allergen Reactions   Ace Inhibitors Cough   Carvedilol      Dizziness    Codeine Nausea Only   Kenalog  [Triamcinolone  Acetonide] Other (See Comments)    11/2020 post-injection pain after knee injection.  Kenalog  preservatives most likely. She should have no issue with oral steroids, or other injectables.  (Ie. Depo-Medrol , Solu-Medrol , Decadron , Celestone, etc.)

## 2024-01-16 NOTE — Assessment & Plan Note (Signed)
 Due to hypovolemia, acute GI bleed anemia -Monitoring BP closely-Will hold BP meds including Norvasc , If blood pressure remains stable, will continue metoprolol  for rate control

## 2024-01-16 NOTE — ED Provider Triage Note (Signed)
 Emergency Medicine Provider Triage Evaluation Note  Lacey Brown , a 83 y.o. female  was evaluated in triage.  Pt complains of nausea, 1 small episode of vomiting, dark stools, intermittent epigastric discomfort,.  On medications including Eliquis  for atrial fibrillation  Review of Systems  Positive: Nausea and vomiting dark stools epigastric pain Negative: Coughing shortness of breath or swelling of the legs  Physical Exam  BP (!) 110/50 (BP Location: Left Arm)   Pulse 96   Temp 98.2 F (36.8 C)   Resp 19   Ht 1.524 m (5')   Wt 67.6 kg   SpO2 100%   BMI 29.10 kg/m  Gen:   Awake, no distress   Resp:  Normal effort  Cardiac: Heart murmur auscultated, no tachycardia MSK:   Moves extremities without difficulty  Other:  Normal neurologic exam Abdomen: Mild epigastric tenderness but no guarding  Medical Decision Making  Medically screening exam initiated at 11:40 AM.  Appropriate orders placed.  Evlyn Jenkins Ellen was informed that the remainder of the evaluation will be completed by another provider, this initial triage assessment does not replace that evaluation, and the importance of remaining in the ED until their evaluation is complete.  Patient with possible GI bleed, vital signs unremarkable, borderline tachycardia but she is not hypotensive.  Dark stools on Eliquis  needs workup, possible GI bleed, Protonix ordered, labs ordered   Cleotilde Rogue, MD 01/16/24 1141

## 2024-01-16 NOTE — Hospital Course (Signed)
 Lacey Brown is a 83 year old female with extensive history of A-fib on Eliquis , GERD, HLD, HTN, PVD, arthritis, left breast cancer... Presented with chief complaint of abdominal pain, generalized weaknesses.  Denies any chest pain or shortness of breath, denies any dizziness.  Denies of having any recent illnesses.   ED Evaluation: Blood pressure (!) 110/50, pulse 96, temperature 98.2 F (36.8 C), resp. rate 19, height 5' (1.524 m), weight 67.6 kg, SpO2 100%. Labs: WBC 11.9, hemoglobin 6.5 (down from 12.3 in 12/22/23), PT 17.2, INR 1.3, Hemoccult positive x 2  EDP consulted GI, ordered 2 U PRBC to be transfused.

## 2024-01-16 NOTE — Consult Note (Signed)
 Consultation Note   Referring Provider:   [facility] PCP: [name], MD Primary Gastroenterologist:  Dr. Unk ( [facility])       Reason for Consultation: GI bleed on Eliquis  DOA: 01/16/2024         Hospital Day: 1   ASSESSMENT    83 year old female admitted with   Upper GI bleed with melena   Symptomatic, acute blood loss anemia  Eliquis  use  Mildly tachycardic but otherwise hemodynamically stable . presenting hemoglobin 6.5, down from 12.3 three weeks ago.  Rule out erosive disease, PUD, gastrointestinal AVMs, gastrointestinal neoplasm  Atrial fibrillation, on Eliquis  Last dose of Eliquis  was morning of 01/15/2024  GERD Occasional dysphagia ( meat) Takes PPI at home  Hypertension Home BP meds on hold  COPD  History of left breast cancer, s/p left lumpectomy August 2024.  DCIS involving intraductal papilloma, all margins negative.  Takes anastrozole    See PMH for any additional medical history  / medical problems  Principal Problem:   GIB (gastrointestinal bleeding) Active Problems:   HTN (hypertension)   COPD (chronic obstructive pulmonary disease) (HCC)   GERD (gastroesophageal reflux disease)   Chronic hyponatremia   Pure hypercholesterolemia   Malignant neoplasm of upper-inner quadrant of left breast in female, estrogen receptor positive (HCC)   Persistent atrial fibrillation (HCC)   Hypercoagulable state due to persistent atrial fibrillation (HCC)    PLAN:   --Clear liquid diet given --N.p.o. after midnight --Continue twice daily IV pantoprazole --Monitor H&H  --2 units of RBCs already ordered --Schedule for EGD tomorrow. The risks and benefits of EGD with possible biopsies were discussed with the patient who agrees to proceed.  --Further recommendations following EGD   HPI   Brief History:  Patient presented to the ED today for evaluation of weakness .  She began having dark  stools several days ago .  Yesterday she had an episode of acute nonradiating epigastric pain , nausea and nonbloody emesis .  She does not take NSAIDs, uses Tylenol  for pain.  No further nausea, vomiting or abdominal pain today.  She has occasional constipation at home but does not ever see red blood in her stool.  Occasionally meat hangs up in her esophagus.  She takes Prilosec  as needed.  No other GI complaints  In the ED patient has been mildly tachycardic despite being on metoprolol  . HR 105, BP 120/50.   Hemoglobin 6.5, MCV 93, Sodium 130, BUN 36, FOBT positive    Pertinent GI Studies   Colonoscopy 2007 -no results found   Labs and Imaging:  Recent Labs    01/16/24 1204  PROT 5.2*  ALBUMIN 3.0*  AST 15  ALT 15  ALKPHOS 21*  BILITOT 0.4   Recent Labs    01/16/24 1204  WBC 11.9*  HGB 6.5*  HCT 20.0*  MCV 93.5  PLT 180   Recent Labs    01/16/24 1204  NA 130*  K 4.3  CL 99  CO2 23  GLUCOSE 114*  BUN 36*  CREATININE 0.66  CALCIUM  8.5*     Past Medical History:  Diagnosis Date   Arthritis    back, neck; right shoulder;    Asthma    using advair  prn   Cancer (HCC) 2024   Left Breast Cancer   Chicken pox    Dyspnea    Dysrhythmia 09/13/2022   Atrial Fibrillation   Hyperlipidemia    Hypertension    controlled with medication;    New onset of headache in cancer patient 11/17/2022   Peripheral vascular disease    mild dilation of aorta    Past Surgical History:  Procedure Laterality Date   BREAST BIOPSY Left 08/12/2022   US  LT BREAST BX W LOC DEV 1ST LESION IMG BX SPEC US  GUIDE 08/12/2022 [facility]   BREAST BIOPSY Left 08/30/2022   MM LT BREAST BX W LOC DEV EA AD LESION IMG BX SPEC STEREO GUIDE 08/30/2022 [facility]   BREAST BIOPSY Left 08/30/2022   MM LT BREAST BX W LOC DEV 1ST LESION IMAGE BX SPEC STEREO GUIDE 08/30/2022 [facility]   BREAST BIOPSY  09/18/2022   MM LT RADIOACTIVE SEED EA ADD LESION LOC MAMMO GUIDE 09/18/2022  [facility]   BREAST BIOPSY  09/18/2022   MM LT RADIOACTIVE SEED LOC MAMMO GUIDE 09/18/2022 [facility]   BREAST EXCISIONAL BIOPSY Left    BREAST LUMPECTOMY WITH RADIOACTIVE SEED LOCALIZATION Left 09/19/2022   Procedure: LEFT BREAST SEED BRACKETED LUMPECTOMY;  Surgeon: Aron Shoulders, MD;  Location: [facility] OR;  Service: General;  Laterality: Left;   CARDIOVERSION N/A 10/23/2022   Procedure: CARDIOVERSION;  Surgeon: Delford Maude BROCKS, MD;  Location: [facility];  Service: Cardiovascular;  Laterality: N/A;   COLONOSCOPY  2014   FOOT SURGERY Right 1973   cyst removed off right foot   TONSILLECTOMY AND ADENOIDECTOMY  1953    Family History  Problem Relation Age of Onset   Heart disease Mother    Heart disease Father    Arthritis Maternal Grandmother    Diabetes Neg Hx    Cancer Neg Hx    Stroke Neg Hx     Prior to Admission medications  Medication Sig Start Date End Date Taking? Authorizing Provider  acetaminophen  (TYLENOL ) 500 MG tablet Take 500 mg by mouth every 6 (six) hours as needed.    [provider]  acetaminophen  (TYLENOL ) 650 MG CR tablet Take 2 tablets by mouth 2 (two) times daily.    [provider]  albuterol  (VENTOLIN  HFA) 108 (90 Base) MCG/ACT inhaler Inhale 2 puffs into the lungs every 6 (six) hours as needed for wheezing or shortness of breath. 02/28/22   [name], MD  amLODipine  (NORVASC ) 2.5 MG tablet Take 1 tablet (2.5 mg total) by mouth daily. 12/24/23   [name], MD  anastrozole  (ARIMIDEX ) 1 MG tablet TAKE 1 TABLET BY MOUTH EVERY DAY 09/28/23   [name], MD  apixaban  (ELIQUIS ) 5 MG TABS tablet TAKE 1 TABLET BY MOUTH TWICE A DAY 12/09/23   [name], MD  denosumab  (PROLIA ) 60 MG/ML SOSY injection Inject 60 mg into the skin every 6 (six) months.    [provider]  fluticasone  (FLONASE ) 50 MCG/ACT nasal spray Place 2 sprays into both nostrils daily. 01/06/24   [name], MD  metoprolol  succinate  (TOPROL -XL) 25 MG 24 hr tablet TAKE 1 TABLET (25 MG TOTAL) BY MOUTH DAILY. 12/09/23   [name], MD  omeprazole  (PRILOSEC  OTC) 20 MG tablet Take 1 tablet (20 mg total) by mouth daily. In the morning on an empty stomach Patient taking differently: Take 20 mg by mouth daily as needed. In the morning on an empty stomach 06/05/22  [name], MD  telmisartan  (MICARDIS ) 80 MG tablet TAKE 1 TABLET BY MOUTH EVERY DAY 12/05/23   Tullo, Teresa L, MD  trolamine salicylate (ASPERCREME) 10 % cream Apply 1 Application topically as needed for muscle pain.    [provider]  NAPOLEON FRIDGE 250-50 MCG/ACT AEPB INHALE 1 PUFF INTO THE LUNGS IN THE MORNING AND AT BEDTIME. 01/07/24   [name], MD    Current Facility-Administered Medications  Medication Dose Route Frequency Provider Last Rate Last Admin   0.9 %  sodium chloride  infusion (Manually program via Guardrails IV Fluids)   Intravenous Once Prosperi, Christian H, PA-C       0.9 %  sodium chloride  infusion   Intravenous Continuous Shahmehdi, Seyed A, MD       acetaminophen  (TYLENOL ) tablet 650 mg  650 mg Oral Q6H PRN Shahmehdi, Seyed A, MD       Or   acetaminophen  (TYLENOL ) suppository 650 mg  650 mg Rectal Q6H PRN Shahmehdi, Seyed A, MD       bisacodyl (DULCOLAX) EC tablet 5 mg  5 mg Oral Daily PRN Willette Adriana LABOR, MD       [START ON 02/21/2024] denosumab  (PROLIA ) injection 60 mg  60 mg Subcutaneous Q6 months Tullo, Teresa L, MD       hydrALAZINE (APRESOLINE) injection 10 mg  10 mg Intravenous Q4H PRN Shahmehdi, Seyed A, MD       HYDROmorphone (DILAUDID) injection 0.5-1 mg  0.5-1 mg Intravenous Q2H PRN Shahmehdi, Seyed A, MD       ipratropium (ATROVENT) nebulizer solution 0.5 mg  0.5 mg Nebulization Q6H PRN Shahmehdi, Seyed A, MD       metoprolol  succinate (TOPROL -XL) 24 hr tablet 25 mg  25 mg Oral Daily Shahmehdi, Seyed A, MD       ondansetron  (ZOFRAN ) tablet 4 mg  4 mg Oral Q6H PRN Shahmehdi, Seyed A, MD       Or   ondansetron   (ZOFRAN ) injection 4 mg  4 mg Intravenous Q6H PRN Shahmehdi, Seyed A, MD       oxyCODONE  (Oxy IR/ROXICODONE ) immediate release tablet 5 mg  5 mg Oral Q4H PRN Shahmehdi, Seyed A, MD       pantoprazole (PROTONIX) injection 40 mg  40 mg Intravenous Q12H Prosperi, Christian H, PA-C       senna-docusate (Senokot-S) tablet 1 tablet  1 tablet Oral QHS PRN Shahmehdi, Seyed A, MD       sodium phosphate  (FLEET) enema 1 enema  1 enema Rectal Once PRN Shahmehdi, Seyed A, MD       traZODone (DESYREL) tablet 25 mg  25 mg Oral QHS PRN Willette Adriana LABOR, MD       Current Outpatient Medications  Medication Sig Dispense Refill   acetaminophen  (TYLENOL ) 500 MG tablet Take 500 mg by mouth every 6 (six) hours as needed.     acetaminophen  (TYLENOL ) 650 MG CR tablet Take 2 tablets by mouth 2 (two) times daily.     albuterol  (VENTOLIN  HFA) 108 (90 Base) MCG/ACT inhaler Inhale 2 puffs into the lungs every 6 (six) hours as needed for wheezing or shortness of breath. 3.7 g 11   amLODipine  (NORVASC ) 2.5 MG tablet Take 1 tablet (2.5 mg total) by mouth daily. 90 tablet 1   anastrozole  (ARIMIDEX ) 1 MG tablet TAKE 1 TABLET BY MOUTH EVERY DAY 90 tablet 3   apixaban  (ELIQUIS ) 5 MG TABS tablet TAKE 1 TABLET BY MOUTH TWICE A DAY 180 tablet 1  denosumab  (PROLIA ) 60 MG/ML SOSY injection Inject 60 mg into the skin every 6 (six) months.     fluticasone  (FLONASE ) 50 MCG/ACT nasal spray Place 2 sprays into both nostrils daily. 16 g 6   metoprolol  succinate (TOPROL -XL) 25 MG 24 hr tablet TAKE 1 TABLET (25 MG TOTAL) BY MOUTH DAILY. 90 tablet 1   omeprazole  (PRILOSEC  OTC) 20 MG tablet Take 1 tablet (20 mg total) by mouth daily. In the morning on an empty stomach (Patient taking differently: Take 20 mg by mouth daily as needed. In the morning on an empty stomach) 90 tablet 1   telmisartan  (MICARDIS ) 80 MG tablet TAKE 1 TABLET BY MOUTH EVERY DAY 90 tablet 1   trolamine salicylate (ASPERCREME) 10 % cream Apply 1 Application topically as  needed for muscle pain.     WIXELA INHUB 250-50 MCG/ACT AEPB INHALE 1 PUFF INTO THE LUNGS IN THE MORNING AND AT BEDTIME. 60 each 1    Allergies as of 01/16/2024 - Review Complete 01/16/2024  Allergen Reaction Noted   Ace inhibitors Cough 03/10/2013   Carvedilol   02/05/2022   Codeine Nausea Only 04/18/2014   Kenalog  [triamcinolone  acetonide] Other (See Comments) 11/07/2020    Social History   Socioeconomic History   Marital status: Widowed    Spouse name: Not on file   Number of children: Not on file   Years of education: Not on file   Highest education level: Not on file  Occupational History   Not on file  Tobacco Use   Smoking status: Former    Current packs/day: 0.00    Types: Cigarettes    Quit date: 04/17/2009    Years since quitting: 14.7   Smokeless tobacco: Never   Tobacco comments:    Former smoker 09/26/22  Vaping Use   Vaping status: Never Used  Substance and Sexual Activity   Alcohol use: Yes    Alcohol/week: 0.0 standard drinks of alcohol    Comment: occasional   Drug use: No   Sexual activity: Never  Other Topics Concern   Not on file  Social History Narrative   Not on file   Social Drivers of Health   Tobacco Use: Medium Risk (01/16/2024)   Patient History    Smoking Tobacco Use: Former    Smokeless Tobacco Use: Never    Passive Exposure: Not on file  Financial Resource Strain: Low Risk (12/22/2023)   Overall Financial Resource Strain (CARDIA)    Difficulty of Paying Living Expenses: Not hard at all  Food Insecurity: No Food Insecurity (12/22/2023)   Epic    Worried About Programme Researcher, Broadcasting/film/video in the Last Year: Never true    Ran Out of Food in the Last Year: Never true  Transportation Needs: No Transportation Needs (12/22/2023)   Epic    Lack of Transportation (Medical): No    Lack of Transportation (Non-Medical): No  Physical Activity: Inactive (12/22/2023)   Exercise Vital Sign    Days of Exercise per Week: 0 days    Minutes of Exercise  per Session: 0 min  Stress: No Stress Concern Present (12/22/2023)   [employer] of Occupational Health - Occupational Stress Questionnaire    Feeling of Stress: Not at all  Social Connections: Moderately Integrated (12/22/2023)   Social Connection and Isolation Panel    Frequency of Communication with Friends and Family: More than three times a week    Frequency of Social Gatherings with Friends and Family: More than three times a week  Attends Religious Services: More than 4 times per year    Active Member of Clubs or Organizations: Yes    Attends Banker Meetings: More than 4 times per year    Marital Status: Widowed  Intimate Partner Violence: Not At Risk (12/22/2023)   Epic    Fear of Current or Ex-Partner: No    Emotionally Abused: No    Physically Abused: No    Sexually Abused: No  Depression (PHQ2-9): Low Risk (01/06/2024)   Depression (PHQ2-9)    PHQ-2 Score: 0  Alcohol Screen: Low Risk (12/22/2023)   Alcohol Screen    Last Alcohol Screening Score (AUDIT): 1  Housing: Low Risk (12/22/2023)   Epic    Unable to Pay for Housing in the Last Year: No    Number of Times Moved in the Last Year: 0    Homeless in the Last Year: No  Utilities: Not At Risk (12/22/2023)   Epic    Threatened with loss of utilities: No  Health Literacy: Adequate Health Literacy (12/22/2023)   B1300 Health Literacy    Frequency of need for help with medical instructions: Never     Code Status   Code Status: Full Code  Review of Systems: All systems reviewed and negative except where noted in HPI.  Physical Exam: Vital signs in last 24 hours: Temp:  [97.7 F (36.5 C)-98.2 F (36.8 C)] 98.2 F (36.8 C) (12/12 1114) Pulse Rate:  [50-103] 50 (12/12 1400) Resp:  [19-24] 24 (12/12 1400) BP: (110-138)/(50-101) 120/50 (12/12 1400) SpO2:  [100 %] 100 % (12/12 1400) Weight:  [67.6 kg] 67.6 kg (12/12 1121)    General:  Pleasant female in NAD Psych:  Cooperative. Normal  mood and affect Eyes: Pupils equal Ears:  Normal auditory acuity Nose: No deformity, discharge or lesions Neck:  Supple, no masses felt Lungs:  Clear to auscultation.  Heart: Slightly tachycardic, irregular rhythm.  Murmur is present.  Abdomen:  Soft, nondistended, nontender, active bowel sounds, no masses felt Rectal :  Deferred Msk: Symmetrical without gross deformities.  Neurologic:  Alert, oriented, grossly normal neurologically Extremities : No edema Skin:  Intact without significant lesions.    Intake/Output from previous day: No intake/output data recorded. Intake/Output this shift:  No intake/output data recorded.   Vina Dasen, NP-C   01/16/2024, 2:46 PM

## 2024-01-16 NOTE — Assessment & Plan Note (Signed)
-   Acute GI bleed, with hemorrhagic anemia In the setting of Eliquis  -Hemoccult positive x 2 -Hemoglobin 6.5 -Holding Eliquis , IV Protonix 40 mg twice daily -On clear liquid diet, n.p.o. after midnight -GI consulted, pending evaluation recommendations

## 2024-01-17 ENCOUNTER — Encounter (HOSPITAL_COMMUNITY): Admission: EM | Disposition: A | Payer: Self-pay | Source: Home / Self Care | Attending: Internal Medicine

## 2024-01-17 ENCOUNTER — Encounter (HOSPITAL_COMMUNITY): Payer: Self-pay | Admitting: Family Medicine

## 2024-01-17 ENCOUNTER — Observation Stay (HOSPITAL_COMMUNITY): Admitting: Anesthesiology

## 2024-01-17 DIAGNOSIS — K449 Diaphragmatic hernia without obstruction or gangrene: Secondary | ICD-10-CM | POA: Diagnosis not present

## 2024-01-17 DIAGNOSIS — Y92009 Unspecified place in unspecified non-institutional (private) residence as the place of occurrence of the external cause: Secondary | ICD-10-CM | POA: Diagnosis not present

## 2024-01-17 DIAGNOSIS — Z79811 Long term (current) use of aromatase inhibitors: Secondary | ICD-10-CM | POA: Diagnosis not present

## 2024-01-17 DIAGNOSIS — K3189 Other diseases of stomach and duodenum: Secondary | ICD-10-CM

## 2024-01-17 DIAGNOSIS — K254 Chronic or unspecified gastric ulcer with hemorrhage: Secondary | ICD-10-CM | POA: Diagnosis present

## 2024-01-17 DIAGNOSIS — Z885 Allergy status to narcotic agent status: Secondary | ICD-10-CM | POA: Diagnosis not present

## 2024-01-17 DIAGNOSIS — K921 Melena: Secondary | ICD-10-CM | POA: Diagnosis not present

## 2024-01-17 DIAGNOSIS — K250 Acute gastric ulcer with hemorrhage: Secondary | ICD-10-CM | POA: Diagnosis not present

## 2024-01-17 DIAGNOSIS — Z8249 Family history of ischemic heart disease and other diseases of the circulatory system: Secondary | ICD-10-CM | POA: Diagnosis not present

## 2024-01-17 DIAGNOSIS — Z888 Allergy status to other drugs, medicaments and biological substances status: Secondary | ICD-10-CM | POA: Diagnosis not present

## 2024-01-17 DIAGNOSIS — E861 Hypovolemia: Secondary | ICD-10-CM | POA: Diagnosis present

## 2024-01-17 DIAGNOSIS — K2970 Gastritis, unspecified, without bleeding: Secondary | ICD-10-CM | POA: Diagnosis not present

## 2024-01-17 DIAGNOSIS — I4819 Other persistent atrial fibrillation: Secondary | ICD-10-CM | POA: Diagnosis present

## 2024-01-17 DIAGNOSIS — T45515A Adverse effect of anticoagulants, initial encounter: Secondary | ICD-10-CM | POA: Diagnosis present

## 2024-01-17 DIAGNOSIS — Z79899 Other long term (current) drug therapy: Secondary | ICD-10-CM | POA: Diagnosis not present

## 2024-01-17 DIAGNOSIS — Z8261 Family history of arthritis: Secondary | ICD-10-CM | POA: Diagnosis not present

## 2024-01-17 DIAGNOSIS — Z7901 Long term (current) use of anticoagulants: Secondary | ICD-10-CM | POA: Diagnosis not present

## 2024-01-17 DIAGNOSIS — I739 Peripheral vascular disease, unspecified: Secondary | ICD-10-CM | POA: Diagnosis present

## 2024-01-17 DIAGNOSIS — I10 Essential (primary) hypertension: Secondary | ICD-10-CM | POA: Diagnosis present

## 2024-01-17 DIAGNOSIS — D6869 Other thrombophilia: Secondary | ICD-10-CM | POA: Diagnosis present

## 2024-01-17 DIAGNOSIS — D6832 Hemorrhagic disorder due to extrinsic circulating anticoagulants: Secondary | ICD-10-CM | POA: Diagnosis present

## 2024-01-17 DIAGNOSIS — K922 Gastrointestinal hemorrhage, unspecified: Principal | ICD-10-CM | POA: Diagnosis present

## 2024-01-17 DIAGNOSIS — E871 Hypo-osmolality and hyponatremia: Secondary | ICD-10-CM | POA: Diagnosis present

## 2024-01-17 DIAGNOSIS — D62 Acute posthemorrhagic anemia: Secondary | ICD-10-CM | POA: Diagnosis present

## 2024-01-17 DIAGNOSIS — C50212 Malignant neoplasm of upper-inner quadrant of left female breast: Secondary | ICD-10-CM | POA: Diagnosis present

## 2024-01-17 DIAGNOSIS — R1310 Dysphagia, unspecified: Secondary | ICD-10-CM | POA: Diagnosis present

## 2024-01-17 DIAGNOSIS — F32A Depression, unspecified: Secondary | ICD-10-CM | POA: Diagnosis present

## 2024-01-17 DIAGNOSIS — R059 Cough, unspecified: Secondary | ICD-10-CM | POA: Diagnosis present

## 2024-01-17 DIAGNOSIS — J449 Chronic obstructive pulmonary disease, unspecified: Secondary | ICD-10-CM | POA: Diagnosis not present

## 2024-01-17 DIAGNOSIS — Z87891 Personal history of nicotine dependence: Secondary | ICD-10-CM | POA: Diagnosis not present

## 2024-01-17 DIAGNOSIS — K2900 Acute gastritis without bleeding: Secondary | ICD-10-CM

## 2024-01-17 DIAGNOSIS — J4489 Other specified chronic obstructive pulmonary disease: Secondary | ICD-10-CM | POA: Diagnosis present

## 2024-01-17 DIAGNOSIS — E7800 Pure hypercholesterolemia, unspecified: Secondary | ICD-10-CM | POA: Diagnosis present

## 2024-01-17 HISTORY — PX: ESOPHAGOGASTRODUODENOSCOPY: SHX5428

## 2024-01-17 HISTORY — PX: HEMOSTASIS CLIP PLACEMENT: SHX6857

## 2024-01-17 LAB — TYPE AND SCREEN
ABO/RH(D): O POS
Antibody Screen: NEGATIVE
Unit division: 0
Unit division: 0
Unit division: 0

## 2024-01-17 LAB — CBC
HCT: 26.2 % — ABNORMAL LOW (ref 36.0–46.0)
Hemoglobin: 9.2 g/dL — ABNORMAL LOW (ref 12.0–15.0)
MCH: 31.2 pg (ref 26.0–34.0)
MCHC: 35.1 g/dL (ref 30.0–36.0)
MCV: 88.8 fL (ref 80.0–100.0)
Platelets: 150 K/uL (ref 150–400)
RBC: 2.95 MIL/uL — ABNORMAL LOW (ref 3.87–5.11)
RDW: 13.8 % (ref 11.5–15.5)
WBC: 11.2 K/uL — ABNORMAL HIGH (ref 4.0–10.5)
nRBC: 0.2 % (ref 0.0–0.2)

## 2024-01-17 LAB — BASIC METABOLIC PANEL WITH GFR
Anion gap: 8 (ref 5–15)
BUN: 22 mg/dL (ref 8–23)
CO2: 22 mmol/L (ref 22–32)
Calcium: 7.7 mg/dL — ABNORMAL LOW (ref 8.9–10.3)
Chloride: 103 mmol/L (ref 98–111)
Creatinine, Ser: 0.67 mg/dL (ref 0.44–1.00)
GFR, Estimated: >60 mL/min (ref >60)
Glucose, Bld: 96 mg/dL (ref 70–99)
Potassium: 3.9 mmol/L (ref 3.5–5.1)
Sodium: 133 mmol/L — ABNORMAL LOW (ref 135–145)

## 2024-01-17 LAB — PROTIME-INR
INR: 1.2 (ref 0.8–1.2)
Prothrombin Time: 15.4 s — ABNORMAL HIGH (ref 11.4–15.2)

## 2024-01-17 LAB — BPAM RBC
Blood Product Expiration Date: 202601102359
Blood Product Expiration Date: 202601112359
Blood Product Expiration Date: 202601112359
ISSUE DATE / TIME: 202512121448
ISSUE DATE / TIME: 202512121658
ISSUE DATE / TIME: 202512122050
Unit Type and Rh: 5100
Unit Type and Rh: 5100
Unit Type and Rh: 5100

## 2024-01-17 LAB — APTT: aPTT: 29 s (ref 24–36)

## 2024-01-17 SURGERY — EGD (ESOPHAGOGASTRODUODENOSCOPY)
Anesthesia: Monitor Anesthesia Care

## 2024-01-17 MED ORDER — EPINEPHRINE 1 MG/10ML IV SOSY
PREFILLED_SYRINGE | INTRAVENOUS | Status: AC
  Filled 2024-01-17: qty 10

## 2024-01-17 MED ORDER — PHENYLEPHRINE 80 MCG/ML (10ML) SYRINGE FOR IV PUSH (FOR BLOOD PRESSURE SUPPORT)
PREFILLED_SYRINGE | INTRAVENOUS | Status: DC | PRN
  Administered 2024-01-17 (×2): 160 ug via INTRAVENOUS

## 2024-01-17 MED ORDER — LIDOCAINE 2% (20 MG/ML) 5 ML SYRINGE
INTRAMUSCULAR | Status: DC | PRN
  Administered 2024-01-17: 60 mg via INTRAVENOUS

## 2024-01-17 MED ORDER — PROPOFOL 500 MG/50ML IV EMUL
INTRAVENOUS | Status: DC | PRN
  Administered 2024-01-17: 125 ug/kg/min via INTRAVENOUS

## 2024-01-17 MED ORDER — PROPOFOL 10 MG/ML IV BOLUS
INTRAVENOUS | Status: DC | PRN
  Administered 2024-01-17 (×3): 20 mg via INTRAVENOUS
  Administered 2024-01-17 (×2): 30 mg via INTRAVENOUS
  Administered 2024-01-17: 20 mg via INTRAVENOUS

## 2024-01-17 MED ORDER — SODIUM CHLORIDE 0.9 % IV SOLN: INTRAVENOUS | Status: DC | PRN

## 2024-01-17 MED ADMIN — Pantoprazole Sodium For IV Soln 40 MG (Base Equiv): 40 mg | INTRAVENOUS | NDC 00008092351

## 2024-01-17 NOTE — Anesthesia Preprocedure Evaluation (Addendum)
 Anesthesia Evaluation  Patient identified by MRN, date of birth, ID band Patient awake    Reviewed: Allergy & Precautions, NPO status , Patient's Chart, lab work & pertinent test results  Airway Mallampati: II  TM Distance: >3 FB Neck ROM: Full    Dental no notable dental hx. (+) Partial Upper   Pulmonary asthma , COPD, former smoker   Pulmonary exam normal        Cardiovascular hypertension, Pt. on medications and Pt. on home beta blockers + Peripheral Vascular Disease  + dysrhythmias Atrial Fibrillation  Rhythm:Regular Rate:Normal     Neuro/Psych  Headaches  negative psych ROS   GI/Hepatic Neg liver ROS,GERD  Medicated,,  Endo/Other  negative endocrine ROS    Renal/GU negative Renal ROS  negative genitourinary   Musculoskeletal  (+) Arthritis , Osteoarthritis,    Abdominal Normal abdominal exam  (+)   Peds  Hematology  (+) Blood dyscrasia, anemia Lab Results      Component                Value               Date                      WBC                      11.2 (H)            01/17/2024                HGB                      9.2 (L)             01/17/2024                HCT                      26.2 (L)            01/17/2024                MCV                      88.8                01/17/2024                PLT                      150                 01/17/2024              Anesthesia Other Findings   Reproductive/Obstetrics                              Anesthesia Physical Anesthesia Plan  ASA: 3  Anesthesia Plan: MAC   Post-op Pain Management:    Induction: Intravenous  PONV Risk Score and Plan: 2 and Propofol  infusion and Treatment may vary due to age or medical condition  Airway Management Planned: Simple Face Mask and Nasal Cannula  Additional Equipment: None  Intra-op Plan:   Post-operative Plan:   Informed Consent: I have reviewed the patients History and  Physical, chart, labs and discussed the procedure including the risks,  benefits and alternatives for the proposed anesthesia with the patient or authorized representative who has indicated his/her understanding and acceptance.     Dental advisory given  Plan Discussed with: CRNA  Anesthesia Plan Comments:          Anesthesia Quick Evaluation

## 2024-01-17 NOTE — Plan of Care (Signed)
   Problem: Education: Goal: Knowledge of General Education information will improve Description Including pain rating scale, medication(s)/side effects and non-pharmacologic comfort measures Outcome: Progressing

## 2024-01-17 NOTE — Progress Notes (Signed)
 OT Cancellation Note  Patient Details Name: Lacey Brown MRN: [id number] DOB: 04-12-1940   Cancelled Treatment:    Reason Eval/Treat Not Completed: Patient at procedure or test/ unavailable  [name], OTR/L [facility] Acute Rehabilitation Office: [phone]  [name] 01/17/2024, 7:29 AM

## 2024-01-17 NOTE — Interval H&P Note (Signed)
 History and Physical Interval Note: No interval changes overnight. She denies any further melena. No vomiting. Hgb responded to PRBC transfusion. NPO. Last dose Eliquis  2 days ago. I have discussed risks / benefits of EGD / anesthesia, she understands and wishes to proceed. Further recommendations pending results.   01/17/2024 7:19 AM  Lacey Brown  has presented today for surgery, with the diagnosis of melena, anemia.  The various methods of treatment have been discussed with the patient and family. After consideration of risks, benefits and other options for treatment, the patient has consented to  Procedures: EGD (ESOPHAGOGASTRODUODENOSCOPY) (N/A) as a surgical intervention.  [name] history has been reviewed, patient examined, no change in status, stable for surgery.  I have reviewed the patient's chart and labs.  Questions were answered to the patient's satisfaction.     [name]

## 2024-01-17 NOTE — Progress Notes (Signed)
 PROGRESS NOTE  Lacey Brown  DOB: 1940-11-23  PCP: [name], MD FMW:[id number]  DOA: 01/16/2024  LOS: 0 days  Hospital Day: 2  Subjective: Patient was seen and examined this morning.  Pleasant elderly Caucasian female.  Propped up in bed.  Not in distress. Underwent EGD this morning.  Findings as below. Multiple family members were at bedside. Afebrile, heart rate in 70s and 80s, blood pressure in low 100s, breathing room air  Brief narrative: Lacey Brown is a 83 y.o. female with PMH significant for HTN, HLD, PAD, A-fib on Eliquis , GERD, arthritis, left breast cancer. 12/12, patient presented to the ED with chief complaint of abdominal pain, generalized weakness.  In the ED, patient was afebrile, heart rate in 90s, blood pressure in 120s, breathing on room air Labs showed WC count 11.9, hemoglobin 6.5 FOBT positive   2 units of PRBC transfused. Admitted to [facility] GI was consulted 12/13, EGD showed nonbleeding gastric ulcer with a red spot/flat vessel that was treated with gold probe and 2 clips.  Also had gritty esophagitis and gastritis. GI recommended clear liquid diet for today, IV Protonix  40 mg twice daily  Assessment and plan: Acute GI bleeding  Presented abdominal pain, generalized weakness, FOBT positive, low hemoglobin in the setting of chronic anticoagulation Eliquis  was held 12/13, EGD showed nonbleeding gastric ulcer with a red spot/flat vessel that was treated with gold probe and 2 clips.  Also had gritty esophagitis and gastritis. GI recommended clear liquid diet for today, IV Protonix  40 mg twice daily  Acute blood loss anemia Hemoglobin was 12.3 four weeks ago Presented with low hemoglobin 6.5.  Secondary to GI bleeding.  2 units PRBC transfused with improvement in hemoglobin to 9.2 Recent Labs    06/11/23 1415 12/22/23 1518 01/16/24 1204 01/16/24 1643 01/17/24 0359  HGB 11.9* 12.3 6.5* 7.5* 9.2*  MCV 92.7 91.1 93.5  --  88.8   VITAMINB12  --   --   --  179*  --   FOLATE  --   --   --  >20.0  --   TIBC  --   --   --  316  --   IRON  --   --   --  87  --    Persistent A-fib  PTA meds- metoprolol  succinate 25 mg daily continue the same. Eliquis  plan as above.  Hypertension PTA meds- metoprolol  succinate 25 mg daily, Norvasc  2.5 mg daily, telmisartan  80 mg daily Currently continued on metoprolol .  Others remain on hold.  HLD Not on statin??  COPD Breathing status stable   Chronic hyponatremia Chronically low Recent Labs  Lab 01/16/24 1204 01/17/24 0359  NA 130* 133*   ER +ve  Left breast cancer S/p left lumpectomy Status post left lumpectomy Antiestrogen therapy with anastrozole  1 mg daily x 5 years  started 10/09/2022-04/29/2023 (due to muscle aches and pains)   Nutrition Status:         Mobility: Independent at baseline.  Lives alone.  Encourage ambulation.  PT Orders: Active   PT Follow up Rec:     Goals of care   Code Status: Full Code     DVT prophylaxis:  SCDs Start: 01/16/24 1428   Antimicrobials: None Fluid: Stop IV fluid Consultants: GI Family Communication: Multiple family members at bedside  Status: Observation Level of care:  Telemetry   Patient is from: Home Needs to continue in-hospital care: Monitor hemoglobin Anticipated d/c to: Hopefully home tomorrow if hemoglobin stable  Diet:  Diet Order             Diet clear liquid Room service appropriate? Yes; Fluid consistency: Thin  Diet effective now                   Scheduled Meds:  sodium chloride    Intravenous Once   sodium chloride    Intravenous Once   anastrozole   1 mg Oral Daily   metoprolol  succinate  25 mg Oral Daily   pantoprazole  (PROTONIX ) IV  40 mg Intravenous Q12H    PRN meds: acetaminophen  **OR** acetaminophen , bisacodyl , hydrALAZINE , HYDROmorphone  (DILAUDID ) injection, ipratropium, ondansetron  **OR** ondansetron  (ZOFRAN ) IV, oxyCODONE , senna-docusate, sodium phosphate ,  traZODone    Infusions:     Antimicrobials: Anti-infectives (From admission, onward)    None       Objective: Vitals:   01/17/24 1018 01/17/24 1027  BP: (!) 121/49 (!) 121/49  Pulse: 91 91  Resp: 20   Temp: 98.6 F (37 C)   SpO2: 100%     Intake/Output Summary (Last 24 hours) at 01/17/2024 1055 Last data filed at 01/17/2024 0827 Gross per 24 hour  Intake 2596.1 ml  Output --  Net 2596.1 ml   Filed Weights   01/16/24 1121 01/17/24 0500  Weight: 67.6 kg 68.5 kg   Weight change:  Body mass index is 29.49 kg/m.   Physical Exam: General exam: Pleasant, elderly Caucasian female.  Not in distress Skin: No rashes, lesions or ulcers. HEENT: Atraumatic, normocephalic, no obvious bleeding Lungs: Clear to auscultation bilaterally,  CVS: S1, S2, no murmur,   GI/Abd: Soft, nontender, nondistended, bowel sound present,   CNS: Alert, awake, oriented x 3 Psychiatry: Mood appropriate Extremities: No pedal edema, no calf tenderness  Data Review: I have personally reviewed the laboratory data and studies available.  F/u labs ordered Unresulted Labs (From admission, onward)     Start     Ordered   01/17/24 0500  Basic metabolic panel  Daily,   R      01/16/24 1428   01/17/24 0500  CBC  Daily,   R      01/16/24 1429   01/16/24 1428  Expectorated Sputum Assessment w Gram Stain, Rflx to Resp Cult  Once,   R       Comments: If productive cough    01/16/24 1428            Signed, Chapman Rota, MD [facility] 01/17/2024

## 2024-01-17 NOTE — Progress Notes (Signed)
 OT Cancellation Note  Patient Details Name: Lacey Brown MRN: [id number] DOB: [birth year]   Cancelled Treatment:    Reason Eval/Treat Not Completed: OT screened, no needs identified, will sign off. Pt independent.   [name], OTR/L [facility] Acute Rehabilitation Office: [phone]   [name] 01/17/2024, 3:37 PM

## 2024-01-17 NOTE — Op Note (Addendum)
 [facility] Patient Name: Lacey Brown Procedure Date : 01/17/2024 MRN: [id number] Attending MD: Elspeth SQUIBB. Leigh , MD, [id number] Date of Birth: 11-Dec-1940 CSN: [id number] Age: 83 Admit Type: Inpatient Procedure:                Upper GI endoscopy Indications:              Melena / anemia - on Eliquis  Providers:                Elspeth SQUIBB. Leigh, MD, Collene Edu, RN, Felice Sar, Technician Referring MD:              Medicines:                Monitored Anesthesia Care Complications:            No immediate complications. Estimated blood loss:                            Minimal. Estimated Blood Loss:     Estimated blood loss was minimal. Procedure:                Pre-Anesthesia Assessment:                           - Prior to the procedure, a History and Physical                            was performed, and patient medications and                            allergies were reviewed. [name] tolerance of                            previous anesthesia was also reviewed. The risks                            and benefits of the procedure and the sedation                            options and risks were discussed with the patient.                            All questions were answered, and informed consent                            was obtained. Prior Anticoagulants: The patient has                            taken Eliquis  (apixaban ), last dose was 2 days                            prior to procedure. ASA Grade Assessment: III - A  patient with severe systemic disease. After                            reviewing the risks and benefits, the patient was                            deemed in satisfactory condition to undergo the                            procedure.                           After obtaining informed consent, the endoscope was                            passed under direct vision. Throughout the                             procedure, the patient's blood pressure, pulse, and                            oxygen saturations were monitored continuously. The                            [id number]-H190 ([id number]) Olympus endoscope was introduced                            through the mouth, and advanced to the second part                            of duodenum. The upper GI endoscopy was                            accomplished without difficulty. The patient                            tolerated the procedure well. Scope In: Scope Out: Findings:      Esophagogastric landmarks were identified: the Z-line was found at 35       cm, the gastroesophageal junction was found at 35 cm and the upper       extent of the gastric folds was found at 37 cm from the incisors.      A 2 cm hiatal hernia was present.      There was mild LA grade A esophagitis. The exam of the esophagus was       otherwise normal.      One non-bleeding cratered gastric ulcer with a red spot / underlying       flat vessel was found in the gastric antrum. The lesion was 4 mm in       largest dimension. Fulguration to ablate the vessel to prevent       re-bleeding by Gold probe was successful. To prevent bleeding       post-intervention, two hemostatic clips were successfully placed across       the lesion (there was some fibrosis in the area along a fold - I used  16mm Duraclip x 2 which appeared to approximade the edges well)      Patchy mild inflammation characterized by friability and granularity was       found in the gastric fundus and in the gastric body. Biopsies were taken       with a cold forceps for Helicobacter pylori testing.      The exam of the stomach was otherwise normal.      The examined duodenum was normal. Impression:               - Esophagogastric landmarks identified.                           - 2 cm hiatal hernia.                           - LA Grade A esophagitis                           - Normal  esophagus otherwise.                           - Non-bleeding gastric ulcer with a red spot / flat                            vessel. Treated with Gold probe and 2 clips.                           - Gastritis. Biopsied.                           - Normal examined duodenum. Recommendation:           - Return patient to hospital ward for ongoing care.                           - Clear liquid diet today                           - Continue present medications.                           - Continue IV protonix  40mg  BID                           - Continue to hold Eliquis                            - Await pathology results.                           - Monitor Hgb and for re-bleeding                           - GI service will follow Procedure Code(s):        --- Professional ---                           43255, 59,  Esophagogastroduodenoscopy, flexible,                            transoral; with control of bleeding, any method                           43239, Esophagogastroduodenoscopy, flexible,                            transoral; with biopsy, single or multiple Diagnosis Code(s):        --- Professional ---                           K44.9, Diaphragmatic hernia without obstruction or                            gangrene                           K25.4, Chronic or unspecified gastric ulcer with                            hemorrhage                           K29.70, Gastritis, unspecified, without bleeding                           K92.1, Melena (includes Hematochezia) CPT copyright 2022 American Medical Association. All rights reserved. The codes documented in this report are preliminary and upon coder review may  be revised to meet current compliance requirements. Elspeth P. Gordie Belvin, MD 01/17/2024 8:35:15 AM This report has been signed electronically. Number of Addenda: 0

## 2024-01-17 NOTE — Transfer of Care (Signed)
 Immediate Anesthesia Transfer of Care Note  Patient: Lacey Brown  Procedure(s) Performed: EGD (ESOPHAGOGASTRODUODENOSCOPY) CONTROL OF HEMORRHAGE, GI TRACT, ENDOSCOPIC, BY CLIPPING OR OVERSEWING  Patient Location: PACU  Anesthesia Type:MAC  Level of Consciousness: awake, alert , and oriented  Airway & Oxygen Therapy: Patient Spontanous Breathing  Post-op Assessment: Report given to RN and Post -op Vital signs reviewed and stable  Post vital signs: Reviewed and stable  Last Vitals:  Vitals Value Taken Time  BP 100/60 01/17/24 08:31  Temp 36.7 C 01/17/24 08:31  Pulse 76 01/17/24 08:35  Resp 23 01/17/24 08:35  SpO2 97 % 01/17/24 08:35  Vitals shown include unfiled device data.  Last Pain:  Vitals:   01/17/24 0831  TempSrc:   PainSc: 0-No pain      Patients Stated Pain Goal: 0 (01/17/24 0313)  Complications: No notable events documented.

## 2024-01-17 NOTE — Plan of Care (Signed)
°  Problem: Education: Goal: Knowledge of General Education information will improve Description: Including pain rating scale, medication(s)/side effects and non-pharmacologic comfort measures Outcome: Progressing   Problem: Activity: Goal: Risk for activity intolerance will decrease Outcome: Progressing   Problem: Coping: Goal: Level of anxiety will decrease Outcome: Progressing   Problem: Pain Managment: Goal: General experience of comfort will improve and/or be controlled Outcome: Completed/Met   Problem: Safety: Goal: Ability to remain free from injury will improve Outcome: Completed/Met

## 2024-01-17 NOTE — Evaluation (Signed)
 Physical Therapy Brief Evaluation and Discharge Note Patient Details Name: Lacey Brown MRN: [id number] DOB: [birth year] Today's Date: 01/17/2024   History of Present Illness  Patient is 83 yo female admitted on 01/16/24 for abdominal pain and generalized weakness. EGD on 12/13 showed nonbleeding gastric ulcer.  PMH significant for HTN, HLD, PAD, A-fib on Eliquis , GERD, arthritis, left breast cancer.  Clinical Impression  PTA, patient lived alone in one level condo. Reports occasional use of 4WW for ambulation when she has flare up of back pain. Patient presents today at her baseline, independent with all functional mobility. Patient preferred to use 2WW this date secondary to having been in bed longer than usual. No balance deficits observed. Patient has no further PT needs. Recommend d/c home with PRN assist.       PT Assessment Patient does not need any further PT services  Assistance Needed at Discharge  PRN    Equipment Recommendations None recommended by PT  Recommendations for Other Services       Precautions/Restrictions Precautions Precautions: None        Mobility  Bed Mobility Rolling: Modified independent (Device/Increase time) Supine/Sidelying to sit: Modified independent (Device/Increased time) Sit to supine/sidelying: Modified independent (Device/Increased time)    Transfers Overall transfer level: Modified independent Equipment used: Rolling walker (2 wheels)                    Ambulation/Gait Ambulation/Gait assistance: Modified independent (Device/Increase time) Gait Distance (Feet): 50 Feet Assistive device: Rolling walker (2 wheels) Gait Pattern/deviations: WFL(Within Functional Limits) Gait Speed: Pace WFL    Home Activity Instructions    Stairs            Modified Rankin (Stroke Patients Only)        Balance Overall balance assessment: No apparent balance deficits (not formally assessed)                         Pertinent Vitals/Pain PT - Brief Vital Signs All Vital Signs Stable: Yes Pain Assessment Pain Assessment: 0-10 Pain Score: 2  Pain Location: low back Pain Intervention(s): Monitored during session     Home Living Family/patient expects to be discharged to:: Private residence (condo) Living Arrangements: Alone Available Help at Discharge: Other (Comment);Family;Friend(s) (sister and neighbor) Home Environment: Level entry   Home Equipment: Rollator (4 wheels);Cane - single point        Prior Function Level of Independence: Independent Comments: Patient reports independence with ambulation/ADLs. (+) driving. Patient reports use of 4WW only occasionally when she is having back pain. Denies history of falls.    UE/LE Assessment        LE ROM/Strength/Tone/Coordination: [facility]      Communication   Communication Communication: No apparent difficulties     Cognition Overall Cognitive Status: Appears within functional limits for tasks assessed/performed       General Comments      Exercises     Assessment/Plan    PT Problem List         PT Visit Diagnosis Muscle weakness (generalized) (M62.81)    No Skilled PT Patient is independent with all acitivity/mobility   Co-evaluation                AMPAC 6 Clicks Help needed turning from your back to your side while in a flat bed without using bedrails?: None Help needed moving from lying on your back to sitting on the side of a flat bed without  using bedrails?: None Help needed moving to and from a bed to a chair (including a wheelchair)?: None Help needed standing up from a chair using your arms (e.g., wheelchair or bedside chair)?: None Help needed to walk in hospital room?: None Help needed climbing 3-5 steps with a railing? : A Little 6 Click Score: 23      End of Session   Activity Tolerance: Patient tolerated treatment well Patient left: with family/visitor present;with call bell/phone within  reach (seated EOB)   PT Visit Diagnosis: Muscle weakness (generalized) (M62.81)     Time: 1353-1405 PT Time Calculation (min) (ACUTE ONLY): 12 min  Charges:   PT Evaluation $PT Eval Low Complexity: 1 Low      Sherryle Taos, PT, DPT [facility] Acute Rehabilitation Office: [phone]   Sherryle VEAR [place]  01/17/2024, 2:15 PM

## 2024-01-17 NOTE — Progress Notes (Signed)
 Patient arrived back from endo to [phone] with a 0/10 pain, patient denies any symptoms. A&O X 4, BP 121/49 MD made aware, advised to give metoprolol  but hold other BP medications. Scheduled medications given, order placed for clear liquid diet. All needs met at this time. Bed in lowest position and call light within reach.

## 2024-01-17 NOTE — Anesthesia Postprocedure Evaluation (Signed)
 Anesthesia Post Note  Patient: Lacey Brown  Procedure(s) Performed: EGD (ESOPHAGOGASTRODUODENOSCOPY) CONTROL OF HEMORRHAGE, GI TRACT, ENDOSCOPIC, BY CLIPPING OR OVERSEWING     Patient location during evaluation: PACU Anesthesia Type: MAC Level of consciousness: awake and alert Pain management: pain level controlled Vital Signs Assessment: post-procedure vital signs reviewed and stable Respiratory status: spontaneous breathing, nonlabored ventilation, respiratory function stable and patient connected to nasal cannula oxygen Cardiovascular status: stable and blood pressure returned to baseline Postop Assessment: no apparent nausea or vomiting Anesthetic complications: no   No notable events documented.  Last Vitals:  Vitals:   01/17/24 1400 01/17/24 1738  BP: 129/75 (!) 127/45  Pulse: 71 64  Resp:    Temp: (!) 36.4 C 36.5 C  SpO2: 97% 98%    Last Pain:  Vitals:   01/17/24 1018  TempSrc: Oral  PainSc:                  Cordella SQUIBB Courtney Fenlon

## 2024-01-18 ENCOUNTER — Encounter (HOSPITAL_COMMUNITY): Payer: Self-pay | Admitting: Gastroenterology

## 2024-01-18 ENCOUNTER — Other Ambulatory Visit (HOSPITAL_COMMUNITY): Payer: Self-pay

## 2024-01-18 DIAGNOSIS — K922 Gastrointestinal hemorrhage, unspecified: Secondary | ICD-10-CM

## 2024-01-18 LAB — BASIC METABOLIC PANEL WITH GFR
Anion gap: 8 (ref 5–15)
BUN: 8 mg/dL (ref 8–23)
CO2: 23 mmol/L (ref 22–32)
Calcium: 7.6 mg/dL — ABNORMAL LOW (ref 8.9–10.3)
Chloride: 102 mmol/L (ref 98–111)
Creatinine, Ser: 0.52 mg/dL (ref 0.44–1.00)
GFR, Estimated: >60 mL/min (ref >60)
Glucose, Bld: 97 mg/dL (ref 70–99)
Potassium: 3.6 mmol/L (ref 3.5–5.1)
Sodium: 133 mmol/L — ABNORMAL LOW (ref 135–145)

## 2024-01-18 LAB — CBC
HCT: 25.1 % — ABNORMAL LOW (ref 36.0–46.0)
Hemoglobin: 8.8 g/dL — ABNORMAL LOW (ref 12.0–15.0)
MCH: 31.7 pg (ref 26.0–34.0)
MCHC: 35.1 g/dL (ref 30.0–36.0)
MCV: 90.3 fL (ref 80.0–100.0)
Platelets: 154 K/uL (ref 150–400)
RBC: 2.78 MIL/uL — ABNORMAL LOW (ref 3.87–5.11)
RDW: 14 % (ref 11.5–15.5)
WBC: 9.2 K/uL (ref 4.0–10.5)
nRBC: 0 % (ref 0.0–0.2)

## 2024-01-18 MED ORDER — APIXABAN 5 MG PO TABS: 5.0000 mg | ORAL_TABLET | Freq: Two times a day (BID) | ORAL | Status: AC

## 2024-01-18 MED ORDER — PANTOPRAZOLE SODIUM 40 MG PO TBEC
DELAYED_RELEASE_TABLET | ORAL | 0 refills | Status: DC
  Filled 2024-01-18: qty 90, 60d supply, fill #0

## 2024-01-18 MED ADMIN — Pantoprazole Sodium For IV Soln 40 MG (Base Equiv): 40 mg | INTRAVENOUS | NDC 00008092351

## 2024-01-18 NOTE — Progress Notes (Signed)
 Patient has been discharged per MD order. AVS reviewed with patient, verbalized understanding and all questions answered at this time. IV's removed, tolerated well. Patient will pick up TOC medications on the way out, with this nurse. All needs met at this time, bed in lowest position and call light within reach.

## 2024-01-18 NOTE — Progress Notes (Signed)
 Physician Discharge Summary  Lacey Brown FMW:[id number] DOB: 29-Sep-1940 DOA: 01/16/2024  PCP: [name], MD  Admit date: 01/16/2024 Discharge date: 01/18/2024  Admitted from: Home Discharge disposition: Home  Recommendations at discharge:  Per GI recommendation, -Protonix  40mg  PO BID for 4 weeks and the once daily thereafter.  -Avoid all NSAIDs. -Hold Eliquis  another few days - can resume on Wed 12/17. -To follow-up with GI for pathology report  -To follow-up with PCP within a week or so to check Hgb.  Currently you are blood pressure is controlled with metoprolol .  Can resume telmisartan  at discharge. I would hold amlodipine . Continue to monitor blood pressure at home and further adjustment to be done under supervision of a primary care provider Follow-up with PCP in 1 to 2 weeks to repeat blood work.  Subjective: Patient was seen and examined this morning. Sitting up in recliner.  Not in distress.  Daughter at bedside.  Tolerated soft diet for lunch*** Afebrile, hemodynamically stable Labs this morning with hemoglobin 8.8.  Somewhat dropped from 8.2 yesterday. Followed by GI.  Stable for discharge home today.  Brief narrative: Lacey Brown is a 83 y.o. female with PMH significant for HTN, HLD, PAD, A-fib on Eliquis , GERD, arthritis, left breast cancer. 12/12, patient presented to the ED with chief complaint of abdominal pain, generalized weakness.  In the ED, patient was afebrile, heart rate in 90s, blood pressure in 120s, breathing on room air Labs showed WBC count 11.9, hemoglobin 6.5 FOBT positive   2 units of PRBC transfused. Admitted to [facility] GI was consulted 12/13, EGD showed nonbleeding gastric ulcer with a red spot/flat vessel that was treated with gold probe and 2 clips.  Also had gritty esophagitis and gastritis.  Hospital course: Acute GI bleeding  Presented abdominal pain, generalized weakness, FOBT positive, low hemoglobin in the  setting of chronic anticoagulation Eliquis  was held 12/13, EGD showed nonbleeding gastric ulcer with a red spot/flat vessel that was treated with gold probe and 2 clips.  Also had gritty esophagitis and gastritis. Tolerated soft diet for lunch today*** Per GI recommendation, -Protonix  40mg  PO BID for 4 weeks and the once daily thereafter.  -Avoid all NSAIDs. -Hold Eliquis  another few days - can resume on Wed 12/17. -To follow-up with GI for pathology report  -To follow-up with PCP within a week or so to check Hgb.   Acute blood loss anemia Hemoglobin was 84.3 four weeks ago Presented with low hemoglobin 6.5.  Secondary to GI bleeding.  2 units PRBC transfused with improvement in hemoglobin to 9.2 Repeat hemoglobin this morning is 8.8 which is a slight drop from yesterday probably trying to equilibrium.  Follow-up with PCP in 1 to 2 weeks to repeat blood work. Recent Labs    12/22/23 1518 01/16/24 1204 01/16/24 1643 01/17/24 0359 01/18/24 0227  HGB 12.3 6.5* 7.5* 9.2* 8.8*  MCV 91.1 93.5  --  88.8 90.3  VITAMINB12  --   --  179*  --   --   FOLATE  --   --  >20.0  --   --   TIBC  --   --  316  --   --   IRON  --   --  87  --   --    Persistent A-fib  PTA meds- metoprolol  succinate 25 mg daily continue the same. Eliquis  plan as above.  Hypertension PTA meds- metoprolol  succinate 25 mg daily, Norvasc  2.5 mg daily, telmisartan  80 mg daily Currently blood  pressure is controlled with metoprolol .  Can resume telmisartan  at discharge.  I would hold amlodipine .  Continue to monitor blood pressure at home and further adjustment to be done under supervision of a primary care provider   HLD Not on statin??  COPD Breathing status stable   Chronic hyponatremia Chronically low Recent Labs  Lab 01/16/24 1204 01/17/24 0359 01/18/24 0227  NA 130* 133* 133*   ER +ve  Left breast cancer S/p left lumpectomy Status post left lumpectomy Antiestrogen therapy with anastrozole  1 mg  daily x 5 years  started 10/09/2022-04/29/2023 (due to muscle aches and pains)   Mobility: Independent at baseline.  Lives alone.     Goals of care   Code Status: Full Code   Diet:  Diet Order             DIET SOFT Room service appropriate? Yes; Fluid consistency: Thin  Diet effective now           Diet general                   Nutritional status:  Body mass index is 29.06 kg/m.       Wounds:  -    Discharge Medications:   Allergies as of 01/18/2024       Reactions   Ace Inhibitors Cough   Carvedilol     Dizziness   Codeine Nausea Only   Kenalog  [triamcinolone  Acetonide] Other (See Comments)   11/2020 post-injection pain after knee injection.  Kenalog  preservatives most likely. She should have no issue with oral steroids, or other injectables.  (Ie. Depo-Medrol , Solu-Medrol , Decadron , Celestone, etc.)        Medication List     PAUSE taking these medications    amLODipine  2.5 MG tablet Wait to take this until your doctor or other care provider tells you to start again. Commonly known as: NORVASC  Take 1 tablet (2.5 mg total) by mouth daily.       STOP taking these medications    omeprazole  20 MG tablet Commonly known as: PRILOSEC  OTC       TAKE these medications    acetaminophen  650 MG CR tablet Commonly known as: TYLENOL  Take 650 mg by mouth 2 (two) times daily.   acetaminophen  500 MG tablet Commonly known as: TYLENOL  Take 500 mg by mouth in the morning and at bedtime.   albuterol  108 (90 Base) MCG/ACT inhaler Commonly known as: VENTOLIN  HFA Inhale 2 puffs into the lungs every 6 (six) hours as needed for wheezing or shortness of breath.   anastrozole  1 MG tablet Commonly known as: ARIMIDEX  TAKE 1 TABLET BY MOUTH EVERY DAY   apixaban  5 MG Tabs tablet Commonly known as: Eliquis  Take 1 tablet (5 mg total) by mouth 2 (two) times daily. Start taking on: January 21, 2024 What changed: These instructions start on January 21, 2024.  If you are unsure what to do until then, ask your doctor or other care provider.   Calcium  + Vitamin D3 500-5 MG-MCG Tabs Generic drug: Calcium  Carb-Cholecalciferol Take 1 tablet by mouth daily.   denosumab  60 MG/ML Sosy injection Commonly known as: PROLIA  Inject 60 mg into the skin every 6 (six) months.   fluticasone  50 MCG/ACT nasal spray Commonly known as: FLONASE  Place 2 sprays into both nostrils daily. What changed:  when to take this reasons to take this   metoprolol  succinate 25 MG 24 hr tablet Commonly known as: TOPROL -XL TAKE 1 TABLET (25 MG TOTAL) BY MOUTH DAILY.  pantoprazole  40 MG tablet Commonly known as: Protonix  Protonix  40 mg twice daily for 4 weeks then once daily thereafter   telmisartan  80 MG tablet Commonly known as: MICARDIS  TAKE 1 TABLET BY MOUTH EVERY DAY   trolamine salicylate 10 % cream Commonly known as: ASPERCREME Apply 1 Application topically as needed for muscle pain.   Wixela Inhub 250-50 MCG/ACT Aepb Generic drug: fluticasone -salmeterol INHALE 1 PUFF INTO THE LUNGS IN THE MORNING AND AT BEDTIME.         Follow ups:    Follow-up Information     [name], MD Follow up.   Specialty: Internal Medicine Contact information: [address] [place] [address] [phone]                 Discharge Instructions:   Discharge Instructions     Call MD for:  difficulty breathing, headache or visual disturbances   Complete by: As directed    Call MD for:  extreme fatigue   Complete by: As directed    Call MD for:  hives   Complete by: As directed    Call MD for:  persistant dizziness or light-headedness   Complete by: As directed    Call MD for:  persistant nausea and vomiting   Complete by: As directed    Call MD for:  severe uncontrolled pain   Complete by: As directed    Call MD for:  temperature >100.4   Complete by: As directed    Diet general   Complete by: As directed    Discharge  instructions   Complete by: As directed    Recommendations at discharge:  Per GI recommendation, -Protonix  40mg  PO BID for 4 weeks and the once daily thereafter.  -Avoid all NSAIDs. -Hold Eliquis  another few days - can resume on Wed 12/17. -To follow-up with GI for pathology report  -To follow-up with PCP within a week or so to check Hgb.   Currently you are blood pressure is controlled with metoprolol .  Can resume telmisartan  at discharge. I would hold amlodipine . Continue to monitor blood pressure at home and further adjustment to be done under supervision of a primary care provider  Follow-up with PCP in 1 to 2 weeks to repeat blood work.  General discharge instructions: Follow with Primary MD [name], MD in 7 days  Please request your PCP  to go over your hospital tests, procedures, radiology results at the follow up. Please get your medicines reviewed and adjusted.  Your PCP may decide to repeat certain labs or tests as needed. Do not drive, operate heavy machinery, perform activities at heights, swimming or participation in water activities or provide baby sitting services if your were admitted for syncope or siezures until you have seen by Primary MD or a Neurologist and advised to do so again. [place]  Controlled Substance Reporting System database was reviewed. Do not drive, operate heavy machinery, perform activities at heights, swim, participate in water activities or provide baby-sitting services while on medications for pain, sleep and mood until your outpatient physician has reevaluated you and advised to do so again.  You are strongly recommended to comply with the dose, frequency and duration of prescribed medications. Activity: As tolerated with Full fall precautions use walker/cane & assistance as needed Avoid using any recreational substances like cigarette, tobacco, alcohol, or non-prescribed drug. If you experience worsening of your admission symptoms, develop  shortness of breath, life threatening emergency, suicidal or homicidal thoughts you  must seek medical attention immediately by calling 911 or calling your MD immediately  if symptoms less severe. You must read complete instructions/literature along with all the possible adverse reactions/side effects for all the medicines you take and that have been prescribed to you. Take any new medicine only after you have completely understood and accepted all the possible adverse reactions/side effects.  Wear Seat belts while driving. You were cared for by a hospitalist during your hospital stay. If you have any questions about your discharge medications or the care you received while you were in the hospital after you are discharged, you can call the unit and ask to speak with the hospitalist or the covering physician. Once you are discharged, your primary care physician will handle any further medical issues. Please note that NO REFILLS for any discharge medications will be authorized once you are discharged, as it is imperative that you return to your primary care physician (or establish a relationship with a primary care physician if you do not have one).   Increase activity slowly   Complete by: As directed        Discharge Exam:   Vitals:   01/18/24 0500 01/18/24 0553 01/18/24 0830 01/18/24 0907  BP:  (!) 149/57 (!) 139/56 (!) 139/56  Pulse:  81 98 98  Resp:  17    Temp:  98.8 F (37.1 C) 98 F (36.7 C)   TempSrc:  Oral Oral   SpO2:  94% 99%   Weight: 67.5 kg     Height:        Body mass index is 29.06 kg/m.   General exam: Pleasant, elderly Caucasian female.  Not in distress Skin: No rashes, lesions or ulcers. HEENT: Atraumatic, normocephalic, no obvious bleeding Lungs: Clear to auscultation bilaterally,  CVS: S1, S2, no murmur,   GI/Abd: Soft, nontender, nondistended, bowel sound present,   CNS: Alert, awake, oriented x 3 Psychiatry: Mood appropriate Extremities: No pedal edema, no  calf tenderness   The results of significant diagnostics from this hospitalization (including imaging, microbiology, ancillary and laboratory) are listed below for reference.    Procedures and Diagnostic Studies:   No results found.   Labs:   Basic Metabolic Panel: Recent Labs  Lab 01/16/24 1204 01/16/24 1643 01/17/24 0359 01/18/24 0227  NA 130*  --  133* 133*  K 4.3  --  3.9 3.6  CL 99  --  103 102  CO2 23  --  22 23  GLUCOSE 114*  --  96 97  BUN 36*  --  22 8  CREATININE 0.66  --  0.67 0.52  CALCIUM  8.5*  --  7.7* 7.6*  MG  --  1.9  --   --   PHOS  --  3.5  --   --    GFR Estimated Creatinine Clearance: 44.9 mL/min (by C-G formula based on SCr of 0.52 mg/dL). Liver Function Tests: Recent Labs  Lab 01/16/24 1204  AST 15  ALT 15  ALKPHOS 21*  BILITOT 0.4  PROT 5.2*  ALBUMIN 3.0*   No results for input(s): LIPASE, AMYLASE in the last 168 hours. No results for input(s): AMMONIA in the last 168 hours. Coagulation profile Recent Labs  Lab 01/16/24 1204 01/17/24 0359  INR 1.3* 1.2    CBC: Recent Labs  Lab 01/16/24 1204 01/16/24 1643 01/17/24 0359 01/18/24 0227  WBC 11.9*  --  11.2* 9.2  NEUTROABS 8.8*  --   --   --   HGB 6.5*  7.5* 9.2* 8.8*  HCT 20.0* 22.9* 26.2* 25.1*  MCV 93.5  --  88.8 90.3  PLT 180  --  150 154   Cardiac Enzymes: No results for input(s): CKTOTAL, CKMB, CKMBINDEX, TROPONINI in the last 168 hours. BNP: Invalid input(s): POCBNP CBG: No results for input(s): GLUCAP in the last 168 hours. D-Dimer No results for input(s): DDIMER in the last 72 hours. Hgb A1c No results for input(s): HGBA1C in the last 72 hours. Lipid Profile No results for input(s): CHOL, HDL, LDLCALC, TRIG, CHOLHDL, LDLDIRECT in the last 72 hours. Thyroid  function studies No results for input(s): TSH, T4TOTAL, T3FREE, THYROIDAB in the last 72 hours.  Invalid input(s): FREET3 Anemia work up Recent Labs     01/16/24 1643  VITAMINB12 179*  FOLATE >20.0  TIBC 316  IRON 87   Microbiology No results found for this or any previous visit (from the past 240 hours).  Time coordinating discharge: 45 minutes  Signed: Jake Goodson  [facility] 01/18/2024, 12:05 PM

## 2024-01-18 NOTE — Progress Notes (Addendum)
° ° ° °   Progress Note   Subjective  Patient has not had any further bleeding. Tolerating clear liquids yesterday and on full liquids this AM. She wants to go home. Not in any pain   Objective   Vital signs in last 24 hours: Temp:  [97.5 F (36.4 C)-98.8 F (37.1 C)] 98 F (36.7 C) (12/14 0830) Pulse Rate:  [64-104] 98 (12/14 0907) Resp:  [16-20] 17 (12/14 0553) BP: (102-149)/(28-75) 139/56 (12/14 0907) SpO2:  [94 %-100 %] 99 % (12/14 0830) Weight:  [67.5 kg] 67.5 kg (12/14 0500) Last BM Date : 01/16/24 General:    white female in NAD Neurologic:  Alert and oriented,  grossly normal neurologically. Psych:  Cooperative. Normal mood and affect.  Intake/Output from previous day: 12/13 0701 - 12/14 0700 In: 1596 [P.O.:1196; I.V.:400] Out: -  Intake/Output this shift: No intake/output data recorded.  Lab Results: Recent Labs    01/16/24 1204 01/16/24 1643 01/17/24 0359 01/18/24 0227  WBC 11.9*  --  11.2* 9.2  HGB 6.5* 7.5* 9.2* 8.8*  HCT 20.0* 22.9* 26.2* 25.1*  PLT 180  --  150 154   BMET Recent Labs    01/16/24 1204 01/17/24 0359 01/18/24 0227  NA 130* 133* 133*  K 4.3 3.9 3.6  CL 99 103 102  CO2 23 22 23   GLUCOSE 114* 96 97  BUN 36* 22 8  CREATININE 0.66 0.67 0.52  CALCIUM  8.5* 7.7* 7.6*   LFT Recent Labs    01/16/24 1204  PROT 5.2*  ALBUMIN 3.0*  AST 15  ALT 15  ALKPHOS 21*  BILITOT 0.4   PT/INR Recent Labs    01/16/24 1204 01/17/24 0359  LABPROT 17.2* 15.4*  INR 1.3* 1.2    Studies/Results: No results found.     Assessment / Plan:    83 y/o female here with the following:  Upper GI bleed - secondary to gastric ulcer s/p EGD 12/13 Post hemorrhagic anemia Anticoagulated  EGD yesterday revealed a gastric ulcer with red spot / visible vessel - treated with Goldprobe for coagulation of the red spot and then lesion clipped x 2 with good result. No further bleeding. Biopsies taken to rule out H pylori.  Can advance to soft diet  today. She really wants to go home - her BUN has normalized and Hgb stable, no further evidence of rebleeding. I think okay to go home later today.  Upon discharge continue protonix  40mg  PO BID for 4 weeks and then once daily thereafter. I would hold Eliquis  another few days - resume on Wed of this upcoming week.  Our office can contact her to coordinate follow up. Consideration for repeat EGD in 3-4 months - can discuss with her as outpatient how aggressive she wants to be with that, given her age. Avoid all NSAIDs. PCP can follow up CBC within one week of discharge.  Call with questions, we will sign off for now.  Marcey Naval, MD [facility]

## 2024-01-18 NOTE — Plan of Care (Signed)
  Problem: Education: Goal: Knowledge of General Education information will improve Description: Including pain rating scale, medication(s)/side effects and non-pharmacologic comfort measures Outcome: Progressing   Problem: Clinical Measurements: Goal: Ability to maintain clinical measurements within normal limits will improve Outcome: Progressing Goal: Cardiovascular complication will be avoided Outcome: Progressing   Problem: Activity: Goal: Risk for activity intolerance will decrease Outcome: Progressing   Problem: Skin Integrity: Goal: Risk for impaired skin integrity will decrease Outcome: Progressing

## 2024-01-19 ENCOUNTER — Telehealth: Payer: Self-pay

## 2024-01-19 DIAGNOSIS — K2900 Acute gastritis without bleeding: Secondary | ICD-10-CM

## 2024-01-19 DIAGNOSIS — R790 Abnormal level of blood mineral: Secondary | ICD-10-CM

## 2024-01-19 NOTE — Transitions of Care (Post Inpatient/ED Visit) (Signed)
° °  01/19/2024  Name: Lacey Brown MRN: [id number] DOB: 1940-05-02  Today's TOC FU Call Status: Today's TOC FU Call Status:: Unsuccessful Call (1st Attempt) Unsuccessful Call (1st Attempt) Date: 01/19/24  Attempted to reach the patient regarding the most recent Inpatient/ED visit.  Follow Up Plan: Additional outreach attempts will be made to reach the patient to complete the Transitions of Care (Post Inpatient/ED visit) call.   Arvin Seip RN, BSN, CCM [employer], Population Health Case Manager Phone: [phone]

## 2024-01-19 NOTE — Telephone Encounter (Signed)
 Copied from CRM #[id number]. Topic: General - Other >> Jan 19, 2024 11:18 AM Aleatha C wrote: Reason for CRM: Patient been in hospital  last Friday to Sunday , and has instruction to pass on from Dr and needs to speak to Dr. Marylynn or nurse instruction from [place] and please give her a call

## 2024-01-20 ENCOUNTER — Ambulatory Visit: Payer: Self-pay | Admitting: Gastroenterology

## 2024-01-20 ENCOUNTER — Telehealth: Payer: Self-pay

## 2024-01-20 LAB — SURGICAL PATHOLOGY

## 2024-01-20 NOTE — Addendum Note (Signed)
 Addended by: SEBASTIAN KNEE on: 01/20/2024 05:38 PM   Modules accepted: Orders

## 2024-01-20 NOTE — Transitions of Care (Post Inpatient/ED Visit) (Signed)
° °  01/20/2024  Name: Lacey Brown MRN: [id number] DOB: May 03, 1940  Today's TOC FU Call Status: Today's TOC FU Call Status:: Unsuccessful Call (2nd Attempt) Unsuccessful Call (2nd Attempt) Date: 01/20/24  Attempted to reach the patient regarding the most recent Inpatient/ED visit.  Follow Up Plan: Additional outreach attempts will be made to reach the patient to complete the Transitions of Care (Post Inpatient/ED visit) call.   Alan Ee, RN, BSN, CEN [employer]- Transition of Care Team.  [facility] [phone]

## 2024-01-20 NOTE — Telephone Encounter (Unsigned)
 Copied from CRM #[id number]. Topic: General - Other >> Jan 20, 2024  4:10 PM Aisha D wrote: Reason for CRM:  Patient been in hospital  last Friday to Sunday , and has instruction to pass on from Dr and needs to speak to Dr. Marylynn or nurse instruction from [place] and please give her a call.  UPDATE:Pt is calling back to get an update on the call back request. Pt stated that the provider wanted Dr.Tullo to call the hospital due to the pt losing 2 pints of blood. Pt would like a callback today if possible to discuss this concern.

## 2024-01-20 NOTE — Telephone Encounter (Signed)
 Phone call to patient, she reports that she was admitted to hospital for GI bleed.  Hospital discharge document that patient should have follow up with PCP and lab work in 1-2 weeks.  Patient reports that she is feeling 100% better.  She is holding amlodipine  as instructed, home BP today 127/67, will continue to take BP daily and let us  know if she has any readings greater than 140/90, pt verbalizes understanding.    Spoke with Dr. Marylynn lab orders given for labs to be done on Friday. Patient scheduled for HFU on 02/04/24 per pt request.  Offered appointment on 01/28/24, pt declined appt due to it being Christmas Eve.

## 2024-01-21 ENCOUNTER — Telehealth: Payer: Self-pay

## 2024-01-21 NOTE — Transitions of Care (Post Inpatient/ED Visit) (Signed)
° °  01/21/2024  Name: Lacey Brown MRN: [id number] DOB: Jun 20, 1940  Today's TOC FU Call Status: Today's TOC FU Call Status:: Unsuccessful Call (3rd Attempt) Unsuccessful Call (3rd Attempt) Date: 01/21/24  Attempted to reach the patient regarding the most recent Inpatient/ED visit.  Follow Up Plan: No further outreach attempts will be made at this time. We have been unable to contact the patient.  Arvin Seip RN, BSN, CCM [employer], Population Health Case Manager Phone: [phone]

## 2024-01-21 NOTE — Patient Instructions (Signed)
 Visit Information  Thank you for taking time to visit with me today. Please don't hesitate to contact me if I can be of assistance to you before our next scheduled telephone appointment.  Our next appointment is by telephone on 01/18/24 at 10:30am  Following is a copy of your care plan:   Goals Addressed             This Visit's Progress    [facility] Transitions of Care (TOC) Care Plan       Problems:  Recent Hospitalization for treatment of GI bleed Knowledge Deficit Related to management of GI bleed.  Patient reports mild swelling in her feet.   Goal:  Over the next 30 days, the patient will not experience hospital readmission  Interventions:  Transitions of Care: Doctor Visits  - discussed the importance of doctor visits Communication with primary care provider re: enrollment in the 30 day TOC program Contacted provider for patient needs regarding constipation symptoms Advised to avoid NSAIDS, aspirin, alcohol Reviewed medications and discussed compliance.  Reinforced adherence to prescribed proton pump inhibitors.  Reviewed signs of bleeding Encouraged adequate hydration Advised to elevate feet when sitting or lying down.   Patient Self Care Activities:  Attend all scheduled provider appointments Call pharmacy for medication refills 3-7 days in advance of running out of medications Call provider office for new concerns or questions  Notify RN Care Manager of TOC call rescheduling needs Participate in Transition of Care Program/Attend TOC scheduled calls Take medications as prescribed   Avoid non steroidal anti inflammatory, aspirin, and alcohol Take medications as prescribed Stay hydrated Message sent to patients primary care provider regarding patients complaint of constipation.  Monitor for signs of bleeding such as black/tarry stools, hematemesis, dizziness, palpitations. Elevate feet when sitting and/ or lying down.   Plan:  Telephone follow up appointment with care  management team member scheduled for:  01/28/24 at 10:30 The patient has been provided with contact information for the care management team and has been advised to call with any health related questions or concerns.         Patient verbalizes understanding of instructions and care plan provided today and agrees to view in MyChart. Active MyChart status and patient understanding of how to access instructions and care plan via MyChart confirmed with patient.     The patient has been provided with contact information for the care management team and has been advised to call with any health related questions or concerns.   Please call the care guide team at [phone] if you need to cancel or reschedule your appointment.   Please call the Suicide and Crisis Lifeline: 988 call the [place]  National Suicide Prevention Lifeline: (701)609-6091 or TTY: 519 601 0794 TTY 959-278-1006) to talk to a trained counselor call 1-800-273-TALK (toll free, 24 hour hotline) if you are experiencing a Mental Health or Behavioral Health Crisis or need someone to talk to.  Arvin Seip RN, BSN, CCM [employer], Population Health Case Manager Phone: [phone]

## 2024-01-21 NOTE — Transitions of Care (Post Inpatient/ED Visit) (Signed)
 01/21/2024  Name: Lacey Brown MRN: [id number] DOB: Jun 27, 1940  Today's TOC FU Call Status: Today's TOC FU Call Status:: Successful TOC FU Call Completed TOC FU Call Complete Date: 01/21/24  Patient's Name and Date of Birth confirmed. Name, DOB  Transition Care Management Follow-up Telephone Call Date of Discharge: 01/18/24 Discharge Facility: [facility]) Type of Discharge: Inpatient Admission Primary Inpatient Discharge Diagnosis:: upper GI bleed secondary to gastric ulcer status post EGD How have you been since you were released from the hospital?: Better Any questions or concerns?: Yes Patient Questions/Concerns:: patient states she is having constipation. She reports only having a small bowel movement since 01/18/24. Patient Questions/Concerns Addressed: Notified Provider of Patient Questions/Concerns (message sent to primary care provider regarding constipation symptom.)  Items Reviewed: Did you receive and understand the discharge instructions provided?: Yes Medications obtained,verified, and reconciled?: Yes (Medications Reviewed) Any new allergies since your discharge?: No Dietary orders reviewed?: Yes Type of Diet Ordered:: regular diet Do you have support at home?: Yes People in Home [RPT]: sibling(s) Name of Support/Comfort Primary Source: Claretta Clause  Medications Reviewed Today: Medications Reviewed Today     Reviewed by Shakayla Hickox E, RN (Registered Nurse) on 01/21/24 at 1404  Med List Status: <None>   Medication Order Taking? Sig Documenting Provider Last Dose Status Informant  acetaminophen  (TYLENOL ) 500 MG tablet [id number] Yes Take 500 mg by mouth in the morning and at bedtime. [provider]  Active Self  acetaminophen  (TYLENOL ) 650 MG CR tablet [id number] Yes Take 650 mg by mouth 2 (two) times daily. [provider]  Active Self  albuterol  (VENTOLIN  HFA) 108 (90 Base) MCG/ACT inhaler [id number] Yes Inhale 2 puffs into the lungs  every 6 (six) hours as needed for wheezing or shortness of breath. [name], MD  Active Self  amLODipine  (NORVASC ) 2.5 MG tablet [id number]  Take 1 tablet (2.5 mg total) by mouth daily. [name], MD  Active Self  anastrozole  (ARIMIDEX ) 1 MG tablet [id number] Yes TAKE 1 TABLET BY MOUTH EVERY DAY Gudena, Vinay, MD  Active Self  apixaban  (ELIQUIS ) 5 MG TABS tablet [id number] Yes Take 1 tablet (5 mg total) by mouth 2 (two) times daily. Arlice Reichert, MD  Active   Calcium  Carb-Cholecalciferol (CALCIUM  + VITAMIN D3) 500-5 MG-MCG TABS [id number] Yes Take 1 tablet by mouth daily. [provider]  Active Self  denosumab  (PROLIA ) 60 MG/ML SOSY injection [id number] Yes Inject 60 mg into the skin every 6 (six) months. [provider]  Active Self  denosumab  (PROLIA ) injection 60 mg [id number]   [name], MD  Active   fluticasone  (FLONASE ) 50 MCG/ACT nasal spray [id number] Yes Place 2 sprays into both nostrils daily.  Patient taking differently: Place 2 sprays into both nostrils daily as needed.   Tullo, Teresa L, MD  Active Self  metoprolol  succinate (TOPROL -XL) 25 MG 24 hr tablet [id number] Yes TAKE 1 TABLET (25 MG TOTAL) BY MOUTH DAILY. [name], MD  Active Self  pantoprazole  (PROTONIX ) 40 MG tablet [id number] Yes Take 1 tablet (40mg ) twice daily for 4 weeks then once daily thereafter Dahal, Binaya, MD  Active   telmisartan  (MICARDIS ) 80 MG tablet [id number] Yes TAKE 1 TABLET BY MOUTH EVERY DAY Tullo, Teresa L, MD  Active Self  trolamine salicylate (ASPERCREME) 10 % cream [id number] Yes Apply 1 Application topically as needed for muscle pain. [provider]  Active Self  Turmeric (QC TUMERIC COMPLEX) 500 MG CAPS [id number] Yes Take by mouth  daily. [provider]  Active   NAPOLEON INHUB 250-50 MCG/ACT AEPB [id number] Yes INHALE 1 PUFF INTO THE LUNGS IN THE MORNING AND AT BEDTIME. [name], MD  Active Self            Home Care and  Equipment/Supplies: Were Home Health Services Ordered?: No Any new equipment or medical supplies ordered?: No  Functional Questionnaire: Do you need assistance with bathing/showering or dressing?: No Do you need assistance with meal preparation?: No Do you need assistance with eating?: No Do you have difficulty maintaining continence: No Do you need assistance with getting out of bed/getting out of a chair/moving?: No Do you have difficulty managing or taking your medications?: No  Follow up appointments reviewed: PCP Follow-up appointment confirmed?: Yes Date of PCP follow-up appointment?: 02/04/24 Follow-up Provider: Dr. Verneita Marylynn Specialist [facility] Follow-up appointment confirmed?: NA (patient states Dr. Hassan / GI doctor's office will contact her for appointment.  Patient given contact phone number for Dr.  Leigh) Do you need transportation to your follow-up appointment?: No Do you understand care options if your condition(s) worsen?: Yes-patient verbalized understanding  SDOH Interventions Today    Flowsheet Row Most Recent Value  SDOH Interventions   Food Insecurity Interventions Intervention Not Indicated  Housing Interventions Intervention Not Indicated  Transportation Interventions Intervention Not Indicated  Utilities Interventions Intervention Not Indicated   Discussed and offered 30 day TOC program.  Patient verbally agreed.  The patient has been provided with contact information for the care management team and has been advised to call with any health -related questions or concerns.  The patient verbalized understanding with current plan of care.  The patient is directed to their insurance card regarding availability of benefits coverage.    Arvin Seip RN, BSN, CCM [employer], Population Health Case Manager Phone: [phone]

## 2024-01-23 ENCOUNTER — Other Ambulatory Visit

## 2024-01-23 ENCOUNTER — Telehealth: Payer: Self-pay | Admitting: Internal Medicine

## 2024-01-23 DIAGNOSIS — R790 Abnormal level of blood mineral: Secondary | ICD-10-CM

## 2024-01-23 DIAGNOSIS — K2900 Acute gastritis without bleeding: Secondary | ICD-10-CM

## 2024-01-23 DIAGNOSIS — Z7902 Long term (current) use of antithrombotics/antiplatelets: Secondary | ICD-10-CM

## 2024-01-23 LAB — CBC WITH DIFFERENTIAL/PLATELET
Basophils Absolute: 0.1 K/uL (ref 0.0–0.1)
Basophils Relative: 0.7 % (ref 0.0–3.0)
Eosinophils Absolute: 0.2 K/uL (ref 0.0–0.7)
Eosinophils Relative: 3.3 % (ref 0.0–5.0)
HCT: 29.9 % — ABNORMAL LOW (ref 36.0–46.0)
Hemoglobin: 10 g/dL — ABNORMAL LOW (ref 12.0–15.0)
Lymphocytes Relative: 16.1 % (ref 12.0–46.0)
Lymphs Abs: 1.2 K/uL (ref 0.7–4.0)
MCHC: 33.5 g/dL (ref 30.0–36.0)
MCV: 91.5 fl (ref 78.0–100.0)
Monocytes Absolute: 0.8 K/uL (ref 0.1–1.0)
Monocytes Relative: 11.2 % (ref 3.0–12.0)
Neutro Abs: 5.2 K/uL (ref 1.4–7.7)
Neutrophils Relative %: 68.7 % (ref 43.0–77.0)
Platelets: 288 K/uL (ref 150.0–400.0)
RBC: 3.27 Mil/uL — ABNORMAL LOW (ref 3.87–5.11)
RDW: 14 % (ref 11.5–15.5)
WBC: 7.6 K/uL (ref 4.0–10.5)

## 2024-01-23 NOTE — Telephone Encounter (Signed)
 Pt dropped off handicap placard form to be completed. It's in Dr Lula color folder up front

## 2024-01-23 NOTE — Addendum Note (Signed)
 Addended by: ORLANDO KINGDOM on: 01/23/2024 02:11 PM   Modules accepted: Orders

## 2024-01-24 ENCOUNTER — Ambulatory Visit: Payer: Self-pay | Admitting: Internal Medicine

## 2024-01-27 NOTE — Telephone Encounter (Signed)
 Placed in quick sign folder for signature.

## 2024-01-28 ENCOUNTER — Telehealth: Payer: Self-pay

## 2024-01-28 NOTE — Patient Instructions (Signed)
 Visit Information  Thank you for taking time to visit with me today. Please don't hesitate to contact me if I can be of assistance to you before our next scheduled telephone appointment.  Our next appointment is by telephone on 02/12/23 at 11 am  Following is a copy of your care plan:   Goals Addressed             This Visit's Progress    [facility] Transitions of Care (TOC) Care Plan       Problems:  Recent Hospitalization for treatment of GI bleed Knowledge Deficit Related to management of GI bleed.  Patient reports mild swelling in her feet.   Goal:  Over the next 30 days, the patient will not experience hospital readmission  Interventions:  Transitions of Care: Doctor Visits  - discussed the importance of doctor visits Advised to avoid NSAIDS, aspirin, alcohol Reviewed medications and discussed compliance.  Reinforced adherence to prescribed proton pump inhibitors.  Reviewed signs of bleeding Assessed for signs of bleeding Discussed dietary choices prior to upcoming holiday meal. Advised to avoid greasy, spicy foods.  Encouraged adequate hydration Assessed for ongoing LE swelling Assessed for going constipation symptoms   Patient Self Care Activities:  Attend all scheduled provider appointments Call pharmacy for medication refills 3-7 days in advance of running out of medications Call provider office for new concerns or questions  Notify RN Care Manager of TOC call rescheduling needs Participate in Transition of Care Program/Attend TOC scheduled calls Take medications as prescribed   Avoid non steroidal anti inflammatory, aspirin, and alcohol Take medications as prescribed Stay hydrated Monitor for signs of bleeding such as black/tarry stools, hematemesis, dizziness, palpitations. Report symptoms to provider as soon as possible and/ or seek emergency medical services for severe symptoms.    Plan:  Telephone follow up appointment with care management team member scheduled  for:  02/12/23 at 11 am        Patient verbalizes understanding of instructions and care plan provided today and agrees to view in MyChart. Active MyChart status and patient understanding of how to access instructions and care plan via MyChart confirmed with patient.     The patient has been provided with contact information for the care management team and has been advised to call with any health related questions or concerns.   Please call the care guide team at [phone] if you need to cancel or reschedule your appointment.   Please call the Suicide and Crisis Lifeline: 988 call the [place]  National Suicide Prevention Lifeline: (404)167-6354 or TTY: (531)732-3369 TTY 9305303293) to talk to a trained counselor call 1-800-273-TALK (toll free, 24 hour hotline) if you are experiencing a Mental Health or Behavioral Health Crisis or need someone to talk to.  Arvin Seip RN, BSN, CCM [employer], Population Health Case Manager Phone: [phone]

## 2024-01-28 NOTE — Transitions of Care (Post Inpatient/ED Visit) (Signed)
 " Transition of Care week 2  Visit Note  01/28/2024  Name: Lacey Brown MRN: [id number]          DOB: 01-Apr-1940  Situation: Patient enrolled in [facility] 30-day program. Visit completed with patient by telephone.   Background:   Initial Transition Care Management Follow-up Telephone Call Discharge Date and Diagnosis: 01/18/24, upper GI bleed secondary to gastric ulcer status post EGD   Past Medical History:  Diagnosis Date   Arthritis    back, neck; right shoulder;    Asthma    using advair prn   Cancer (HCC) 2024   Left Breast Cancer   Chicken pox    Dyspnea    Dysrhythmia 09/13/2022   Atrial Fibrillation   Hyperlipidemia    Hypertension    controlled with medication;    New onset of headache in cancer patient 11/17/2022   Peripheral vascular disease    mild dilation of aorta    Assessment: Patient Reported Symptoms: Cognitive Cognitive Status: No symptoms reported, Alert and oriented to person, place, and time, Insightful and able to interpret abstract concepts, Normal speech and language skills      Neurological Neurological Review of Symptoms: No symptoms reported    HEENT HEENT Symptoms Reported: No symptoms reported      Cardiovascular Cardiovascular Symptoms Reported: No symptoms reported    Respiratory Respiratory Symptoms Reported: No symptoms reported    Endocrine Endocrine Symptoms Reported: No symptoms reported Is patient diabetic?: No    Gastrointestinal Gastrointestinal Symptoms Reported: No symptoms reported Additional Gastrointestinal Details: patient denies any GI symptoms. She reports taking her protonix  as prescribed. patient states the constipation has resolved. Gastrointestinal Management Strategies: Medication therapy, Adequate rest, Diet modification (routine follow up with provider)    Genitourinary Genitourinary Symptoms Reported: No symptoms reported    Integumentary Integumentary Symptoms Reported: No symptoms reported     Musculoskeletal Musculoskelatal Symptoms Reviewed: Back pain Additional Musculoskeletal Details: patient states she has not taken her tylenol  650 mg since being discharged from the hospital recently She states she was a little concerned that this may have cause her GI bleeding. patient states she takes the tylenol  500 mg if she needs it for her back pain. Patient reports the swelling in her LE has resolved. Musculoskeletal Management Strategies: Routine screening, Medication therapy      Psychosocial Psychosocial Symptoms Reported: No symptoms reported         There were no vitals filed for this visit. Pain Scale: 0-10 Pain Score: 0-No pain  Medications Reviewed Today     Reviewed by Kiyra Slaubaugh E, RN (Registered Nurse) on 01/28/24 at 1046  Med List Status: <None>   Medication Order Taking? Sig Documenting Provider Last Dose Status Informant  acetaminophen  (TYLENOL ) 500 MG tablet [id number] Yes Take 500 mg by mouth in the morning and at bedtime. [provider]  Active Self  acetaminophen  (TYLENOL ) 650 MG CR tablet [id number]  Take 650 mg by mouth 2 (two) times daily.  Patient not taking: Reported on 01/28/2024   [provider]  Active Self  albuterol  (VENTOLIN  HFA) 108 (90 Base) MCG/ACT inhaler [id number] Yes Inhale 2 puffs into the lungs every 6 (six) hours as needed for wheezing or shortness of breath. [name], MD  Active Self  amLODipine  (NORVASC ) 2.5 MG tablet [id number]  Take 1 tablet (2.5 mg total) by mouth daily. [name], MD  Active Self  anastrozole  (ARIMIDEX ) 1 MG tablet [id number] Yes TAKE 1 TABLET BY MOUTH EVERY DAY  Gudena, Vinay, MD  Active Self  apixaban  (ELIQUIS ) 5 MG TABS tablet [id number] Yes Take 1 tablet (5 mg total) by mouth 2 (two) times daily. Arlice Reichert, MD  Active   Calcium  Carb-Cholecalciferol (CALCIUM  + VITAMIN D3) 500-5 MG-MCG TABS [id number] Yes Take 1 tablet by mouth daily. [provider]  Active Self  denosumab   (PROLIA ) 60 MG/ML SOSY injection [id number] Yes Inject 60 mg into the skin every 6 (six) months. [provider]  Active Self  denosumab  (PROLIA ) injection 60 mg [id number]   [name], MD  Active   fluticasone  (FLONASE ) 50 MCG/ACT nasal spray [id number] Yes Place 2 sprays into both nostrils daily.  Patient taking differently: Place 2 sprays into both nostrils daily as needed.   Tullo, Teresa L, MD  Active Self  metoprolol  succinate (TOPROL -XL) 25 MG 24 hr tablet [id number] Yes TAKE 1 TABLET (25 MG TOTAL) BY MOUTH DAILY. [name], MD  Active Self  pantoprazole  (PROTONIX ) 40 MG tablet [id number] Yes Take 1 tablet (40mg ) twice daily for 4 weeks then once daily thereafter Dahal, Binaya, MD  Active   telmisartan  (MICARDIS ) 80 MG tablet [id number] Yes TAKE 1 TABLET BY MOUTH EVERY DAY Tullo, Teresa L, MD  Active Self  trolamine salicylate (ASPERCREME) 10 % cream [id number] Yes Apply 1 Application topically as needed for muscle pain. [provider]  Active Self  Turmeric (QC TUMERIC COMPLEX) 500 MG CAPS [id number] Yes Take by mouth daily. [provider]  Active   NAPOLEON INHUB 250-50 MCG/ACT AEPB [id number] Yes INHALE 1 PUFF INTO THE LUNGS IN THE MORNING AND AT BEDTIME. [name], MD  Active Self            Goals Addressed             This Visit's Progress    [facility]BCI Transitions of Care (TOC) Care Plan       Problems:  Recent Hospitalization for treatment of GI bleed Knowledge Deficit Related to management of GI bleed.  Patient reports mild swelling in her feet.   Goal:  Over the next 30 days, the patient will not experience hospital readmission  Interventions:  Transitions of Care: Doctor Visits  - discussed the importance of doctor visits Advised to avoid NSAIDS, aspirin, alcohol Reviewed medications and discussed compliance.  Reinforced adherence to prescribed proton pump inhibitors.  Reviewed signs of bleeding Assessed for signs of  bleeding Discussed dietary choices prior to upcoming holiday meal. Advised to avoid greasy, spicy foods.  Encouraged adequate hydration Assessed for ongoing LE swelling Assessed for going constipation symptoms   Patient Self Care Activities:  Attend all scheduled provider appointments Call pharmacy for medication refills 3-7 days in advance of running out of medications Call provider office for new concerns or questions  Notify RN Care Manager of TOC call rescheduling needs Participate in Transition of Care Program/Attend TOC scheduled calls Take medications as prescribed   Avoid non steroidal anti inflammatory, aspirin, and alcohol Take medications as prescribed Stay hydrated Monitor for signs of bleeding such as black/tarry stools, hematemesis, dizziness, palpitations. Report symptoms to provider as soon as possible and/ or seek emergency medical services for severe symptoms.    Plan:  Telephone follow up appointment with care management team member scheduled for:  02/12/23 at 11 am        Recommendation:   Continue Current Plan of Care  Follow Up Plan:   Telephone follow-up in 1 week  Arvin Seip RN, BSN, CCM [employer]  [facility], Population Health Case Manager Phone: [phone]     "

## 2024-01-29 LAB — BASIC METABOLIC PANEL WITH GFR
BUN/Creatinine Ratio: 26 (ref 12–28)
BUN: 15 mg/dL (ref 8–27)
CO2: 23 mmol/L (ref 20–29)
Calcium: 9.2 mg/dL (ref 8.7–10.3)
Chloride: 94 mmol/L — AB (ref 96–106)
Creatinine, Ser: 0.58 mg/dL (ref 0.57–1.00)
Glucose: 99 mg/dL (ref 70–99)
Potassium: 4.6 mmol/L (ref 3.5–5.2)
Sodium: 131 mmol/L — AB (ref 134–144)
eGFR: 89 mL/min/1.73

## 2024-01-29 LAB — CALCIUM, IONIZED: Calcium, Ion: 5.1 mg/dL (ref 4.5–5.6)

## 2024-02-02 NOTE — Telephone Encounter (Signed)
 Spoke with pt and she will pick up when she comes for her appt on Wednesday.

## 2024-02-04 ENCOUNTER — Ambulatory Visit: Admitting: Internal Medicine

## 2024-02-04 ENCOUNTER — Encounter: Payer: Self-pay | Admitting: Internal Medicine

## 2024-02-04 VITALS — BP 138/60 | HR 83 | Ht 60.0 in | Wt 153.2 lb

## 2024-02-04 DIAGNOSIS — E871 Hypo-osmolality and hyponatremia: Secondary | ICD-10-CM

## 2024-02-04 DIAGNOSIS — I10 Essential (primary) hypertension: Secondary | ICD-10-CM

## 2024-02-04 DIAGNOSIS — Z09 Encounter for follow-up examination after completed treatment for conditions other than malignant neoplasm: Secondary | ICD-10-CM | POA: Insufficient documentation

## 2024-02-04 DIAGNOSIS — I4819 Other persistent atrial fibrillation: Secondary | ICD-10-CM

## 2024-02-04 DIAGNOSIS — D62 Acute posthemorrhagic anemia: Secondary | ICD-10-CM

## 2024-02-04 DIAGNOSIS — K922 Gastrointestinal hemorrhage, unspecified: Secondary | ICD-10-CM

## 2024-02-04 NOTE — Assessment & Plan Note (Addendum)
 Secondary to gastric ulcer with exposed vessel in the setting of chron anticoagulation with Eliquis .  She underwent ablation of bleeding vessel and transfusion of 2 units of PRBCs during hospitalization  and has no evidence of rebleed .  Continue protonix  twice daily for 30 days,  then once daily indefinitely

## 2024-02-04 NOTE — Patient Instructions (Addendum)
 You can continue using tylenol  up to 3000 mg a day   (500 + 650)  twice daily  plus one more dose of EITHER 500 or 650  daily    Continue twice daily protonix   for a total  of 4 weeks,  then once daily thereafter   Do not resume amlodipine  unless your home readings are 140/90   Return for labs only  in 2 weeks

## 2024-02-04 NOTE — Assessment & Plan Note (Addendum)
 Patient is stable post discharge  from [facility] on Dec 14 for anemia of acute blood loss secondary to GI bleed  and has no new issues or questions about discharge plans at the visit today for hospital follow up. All labs , imaging studies and progress notes from admission were reviewed with patient today .  Medication reconciliation was done today as well. Follow up appointment with GI was confirmed as well

## 2024-02-04 NOTE — Progress Notes (Addendum)
 "  Subjective:  Patient ID: Lacey Brown, female    DOB: 1940-05-02  Age: 83 y.o. MRN: [id number]  CC: The primary encounter diagnosis was Acute post-hemorrhagic anemia. Diagnoses of Hyponatremia, Hospital discharge follow-up, Upper GI bleed, Primary hypertension, and Hypercoagulable state due to persistent atrial fibrillation [facility]) were also pertinent to this visit.   HPI Lacey Brown presents for  Chief Complaint  Patient presents with   Hospitalization Follow-up    [place] in 83 yr old female with a history of breast cancer and atrial fibrillation on chronic anticoagulation with Eliquis  who was admitted to [facility] on Dec 12  with profound symptomatic anemia secondary to GU bleed.   Her hgb had dropped to 6.5 from  12.5 one month prior).  She was  Was transfused 2 units and underwent EGD which noted a 4 cm gastric ulcer with underlying exposed blood vessel  which was ablated.    She was discharged on Dec 14 IN IMPROVED CONDITION after  her hemoglobin was judged to be stable.      She feels generally well,  is tolerating a regular diet,  and her repeat  hgb post discharge had risen from 8.8 to 10.0  .  Stools  have been described as  brown and solid,  and she has a good appetite without nausea or chest pain   Medication reconciliation was done during today's visit.    1) Atrial fib : She was advised to resume Eliquis  on Dec 17 and contiue use of  Protonix  twice daily for one  month,  followed by once daily use indefinitely.    2) HTN:  amlodipine  was stopped in hospital due to low blood pressure.  She was discharged on  telmisartan  and metoprolol   and advised to follow up with PCP to determine if amlodipine  needed to be restarted     Outpatient Medications Prior to Visit  Medication Sig Dispense Refill   acetaminophen  (TYLENOL ) 500 MG tablet Take 500 mg by mouth in the morning and at bedtime.     albuterol  (VENTOLIN  HFA) 108 (90 Base) MCG/ACT inhaler Inhale 2  puffs into the lungs every 6 (six) hours as needed for wheezing or shortness of breath. 3.7 g 11   anastrozole  (ARIMIDEX ) 1 MG tablet TAKE 1 TABLET BY MOUTH EVERY DAY 90 tablet 3   apixaban  (ELIQUIS ) 5 MG TABS tablet Take 1 tablet (5 mg total) by mouth 2 (two) times daily.     Calcium  Carb-Cholecalciferol (CALCIUM  + VITAMIN D3) 500-5 MG-MCG TABS Take 1 tablet by mouth daily.     denosumab  (PROLIA ) 60 MG/ML SOSY injection Inject 60 mg into the skin every 6 (six) months.     fluticasone  (FLONASE ) 50 MCG/ACT nasal spray Place 2 sprays into both nostrils daily. 16 g 6   metoprolol  succinate (TOPROL -XL) 25 MG 24 hr tablet TAKE 1 TABLET (25 MG TOTAL) BY MOUTH DAILY. 90 tablet 1   pantoprazole  (PROTONIX ) 40 MG tablet Take 1 tablet (40mg ) twice daily for 4 weeks then once daily thereafter 90 tablet 0   telmisartan  (MICARDIS ) 80 MG tablet TAKE 1 TABLET BY MOUTH EVERY DAY 90 tablet 1   trolamine salicylate (ASPERCREME) 10 % cream Apply 1 Application topically as needed for muscle pain.     Turmeric (QC TUMERIC COMPLEX) 500 MG CAPS Take by mouth daily.     WIXELA INHUB 250-50 MCG/ACT AEPB INHALE 1 PUFF INTO THE LUNGS IN THE MORNING AND AT BEDTIME. 60 each 1  amLODipine  (NORVASC ) 2.5 MG tablet Take 1 tablet (2.5 mg total) by mouth daily. (Patient not taking: Reported on 02/04/2024) 90 tablet 1   acetaminophen  (TYLENOL ) 650 MG CR tablet Take 650 mg by mouth 2 (two) times daily. (Patient not taking: Reported on 01/28/2024)     Facility-Administered Medications Prior to Visit  Medication Dose Route Frequency Provider Last Rate Last Admin   [START ON 02/21/2024] denosumab  (PROLIA ) injection 60 mg  60 mg Subcutaneous Q6 months [name], MD        Review of Systems;  Patient denies headache, fevers, malaise, unintentional weight loss, skin rash, eye pain, sinus congestion and sinus pain, sore throat, dysphagia,  hemoptysis , cough, dyspnea, wheezing, chest pain, palpitations, orthopnea, edema, abdominal  pain, nausea, melena, diarrhea, constipation, flank pain, dysuria, hematuria, urinary  Frequency, nocturia, numbness, tingling, seizures,  Focal weakness, Loss of consciousness,  Tremor, insomnia, depression, anxiety, and suicidal ideation.      Objective:  BP 138/60   Pulse 83   Ht 5' (1.524 m)   Wt 153 lb 3.2 oz (69.5 kg)   SpO2 95%   BMI 29.92 kg/m   BP Readings from Last 3 Encounters:  02/04/24 138/60  01/18/24 (!) 139/56  01/16/24 (!) 138/101    Wt Readings from Last 3 Encounters:  02/04/24 153 lb 3.2 oz (69.5 kg)  01/18/24 148 lb 13 oz (67.5 kg)  01/06/24 150 lb 6.4 oz (68.2 kg)    Physical Exam Vitals reviewed.  Constitutional:      General: She is not in acute distress.    Appearance: Normal appearance. She is normal weight. She is not ill-appearing, toxic-appearing or diaphoretic.  HENT:     Head: Normocephalic.  Eyes:     General: No scleral icterus.       Right eye: No discharge.        Left eye: No discharge.     Conjunctiva/sclera: Conjunctivae normal.  Cardiovascular:     Rate and Rhythm: Normal rate and regular rhythm.     Heart sounds: Normal heart sounds.  Pulmonary:     Effort: Pulmonary effort is normal. No respiratory distress.     Breath sounds: Normal breath sounds.  Musculoskeletal:        General: Normal range of motion.  Skin:    General: Skin is warm and dry.  Neurological:     General: No focal deficit present.     Mental Status: She is alert and oriented to person, place, and time. Mental status is at baseline.  Psychiatric:        Mood and Affect: Mood normal.        Behavior: Behavior normal.        Thought Content: Thought content normal.        Judgment: Judgment normal.     Lab Results  Component Value Date   HGBA1C 6.2 06/11/2023   HGBA1C 6.4 12/11/2022   HGBA1C 6.1 (H) 06/06/2022    Lab Results  Component Value Date   CREATININE 0.58 01/23/2024   CREATININE 0.52 01/18/2024   CREATININE 0.67 01/17/2024    Lab  Results  Component Value Date   WBC 7.6 01/23/2024   HGB 10.0 (L) 01/23/2024   HCT 29.9 (L) 01/23/2024   PLT 288.0 01/23/2024   GLUCOSE 99 01/23/2024   CHOL 178 12/22/2023   TRIG 55.0 12/22/2023   HDL 61.70 12/22/2023   LDLDIRECT 53.0 06/11/2023   LDLCALC 105 (H) 12/22/2023   ALT 15 01/16/2024  AST 15 01/16/2024   NA 131 (L) 01/23/2024   K 4.6 01/23/2024   CL 94 (L) 01/23/2024   CREATININE 0.58 01/23/2024   BUN 15 01/23/2024   CO2 23 01/23/2024   TSH 2.00 06/11/2023   INR 1.2 01/17/2024   HGBA1C 6.2 06/11/2023   MICROALBUR <0.7 06/11/2023    No results found.  Assessment & Plan:  .Acute post-hemorrhagic anemia -     CBC with Differential/Platelet; Future  Hyponatremia -     Basic metabolic panel with GFR; Future  Hospital discharge follow-up Assessment & Plan: Patient is stable post discharge  from [facility] on Dec 14 for anemia of acute blood loss secondary to GI bleed  and has no new issues or questions about discharge plans at the visit today for hospital follow up. All labs , imaging studies and progress notes from admission were reviewed with patient today .  Medication reconciliation was done today as well. Follow up appointment with GI was confirmed as well    Upper GI bleed Assessment & Plan: Secondary to gastric ulcer with exposed vessel in the setting of chron anticoagulation with Eliquis .  She underwent ablation of bleeding vessel and transfusion of 2 units of PRBCs during hospitalization  and has no evidence of rebleed .  Continue protonix  twice daily for 30 days,  then once daily indefinitely    Primary hypertension Assessment & Plan: Advised to continue suspension of amlodipine  until home readings are 140/90 or higher    Hypercoagulable state due to persistent atrial fibrillation [facility]) Assessment & Plan: secondary to atrial fibrillation.  She recently suffered a GI bleed and was admitted for stabilization , transfusion and evaluation with EGD .  She was  advised to resume Eliquis  on Dec 17;  repeat CBC has been done. She is tolerating use of Eliquis  for embolic stroke risk mitigation due to  atrial fibrillation. Patient has no current  signs of re bleeding and is advised to notify her specialists prior to any procedure that may required suspension of Eliquis          Follow-up: No follow-ups on file.   Verneita LITTIE Kettering, MD  "

## 2024-02-04 NOTE — Assessment & Plan Note (Signed)
 Advised to continue suspension of amlodipine  until home readings are 140/90 or higher

## 2024-02-06 ENCOUNTER — Other Ambulatory Visit (HOSPITAL_COMMUNITY): Payer: Self-pay

## 2024-02-06 ENCOUNTER — Telehealth: Payer: Self-pay

## 2024-02-06 NOTE — Telephone Encounter (Signed)
 Copied from CRM [id number]. Topic: Appointments - Scheduling Inquiry for Clinic >> Feb 06, 2024 10:38 AM Robinson H wrote: Reason for CRM: Patient is looking to schedule her Prolia  injection.  [place] [phone]  I spoke with patient and scheduled an appointment for her on 02/23/2024 to have her next Prolia  injection.

## 2024-02-06 NOTE — Telephone Encounter (Signed)
 Pt is scheduled for Prolia  on 02/23/2024. Has the Prolia  been approved through pt's insurance yet?

## 2024-02-10 ENCOUNTER — Telehealth: Payer: Self-pay

## 2024-02-10 ENCOUNTER — Other Ambulatory Visit (HOSPITAL_COMMUNITY): Payer: Self-pay

## 2024-02-10 NOTE — Telephone Encounter (Signed)
 Prolia  VOB initiated via [email or web address]  Next Prolia  inj DUE: 02/21/24    [facility] PREFERRED FOR PHARMACY BENEFIT COPAY: $591.37

## 2024-02-10 NOTE — Assessment & Plan Note (Signed)
 secondary to atrial fibrillation.  She recently suffered a GI bleed and was admitted for stabilization , transfusion and evaluation with EGD .  She was advised to resume Eliquis  on Dec 17;  repeat CBC has been done. She is tolerating use of Eliquis  for embolic stroke risk mitigation due to  atrial fibrillation. Patient has no current  signs of re bleeding and is advised to notify her specialists prior to any procedure that may required suspension of Eliquis 

## 2024-02-10 NOTE — Telephone Encounter (Signed)
 Prior auth request for Prolia .

## 2024-02-10 NOTE — Telephone Encounter (Addendum)
 Benefit verification started. Will update referral once complete. New encounter created.

## 2024-02-11 ENCOUNTER — Other Ambulatory Visit (HOSPITAL_COMMUNITY): Payer: Self-pay

## 2024-02-11 NOTE — Telephone Encounter (Signed)
 Pt ready for scheduling for PROLIA  on or after : 02/21/24  Option# 1: Buy/Bill (Office supplied medication)  Out-of-pocket cost due at time of clinic visit: $352  Number of injection/visits approved: ---  Primary: [employer] Prolia  co-insurance: 20% Admin fee co-insurance: 0%  Secondary: --- Prolia  co-insurance:  Admin fee co-insurance:   Medical Benefit Details: Date Benefits were checked: 02/10/24 Deductible: NO/ Coinsurance: 20%/ Admin Fee: 0%  Prior Auth: N/A PA# Expiration Date:   # of doses approved: ----------------------------------------------------------------------- Option# 2- Med Obtained from pharmacy:  Pharmacy benefit: Copay $591.37 (Paid to pharmacy) Admin Fee: 0% (Pay at clinic)  Prior Auth: N/A PA# Expiration Date:   # of doses approved:   If patient wants fill through the pharmacy benefit please send prescription to: [facility], and include estimated need by date in rx notes. Pharmacy will ship medication directly to the office.  Patient NOT eligible for Prolia  Copay Card. Copay Card can make patient's cost as little as $25. Link to apply: https://www.[email or web address].[email or web address]/copay  ** This summary of benefits is an estimation of the patient's out-of-pocket cost. Exact cost may very based on individual plan coverage.

## 2024-02-12 ENCOUNTER — Telehealth: Payer: Self-pay

## 2024-02-12 NOTE — Patient Instructions (Signed)
 Visit Information  Thank you for taking time to visit with me today. Please don't hesitate to contact me if I can be of assistance to you before our next scheduled telephone appointment.  Our next appointment is by telephone on 02/20/24 at 11 am  Following is a copy of your care plan:   Goals Addressed             This Visit's Progress    VBCI Transitions of Care (TOC) Care Plan       Problems:  Recent Hospitalization for treatment of GI bleed Knowledge Deficit Related to management of GI bleed.    Goal:  Over the next 30 days, the patient will not experience hospital readmission  Interventions:  Transitions of Care: Advised to avoid NSAIDS, aspirin, alcohol Reviewed medications and discussed compliance.  Reinforced adherence to prescribed proton pump inhibitors.  Reviewed signs of bleeding Assessed for signs of bleeding Encouraged adequate hydration Assessed for ongoing LE swelling Assessed for going constipation symptoms  Advised to call the gastroenterologist when refill is needed for her protonix  Discussed patients reports symptom of occasional SOB.  Advised patient it SOB becomes more frequent or worsens to call primary care provider and/ or for severe breathing issues to call 911.   Patient states she is scheduled to see her cardiologist for a yearly follow up on 02/19/24. Advised patient to report the occasional SOB at cardiology visit.  Assessed for patients BP reading  Patient Self Care Activities:  Attend all scheduled provider appointments Call pharmacy for medication refills 3-7 days in advance of running out of medications Call provider office for new concerns or questions  Notify RN Care Manager of TOC call rescheduling needs Participate in Transition of Care Program/Attend TOC scheduled calls Take medications as prescribed   Avoid non steroidal anti inflammatory, aspirin, and alcohol Take medications as prescribed Stay hydrated Monitor for signs of bleeding  such as black/tarry stools, hematemesis, dizziness, palpitations. Report symptoms to provider as soon as possible and/ or seek emergency medical services for severe symptoms.  Notify provider for worsening shortness of breath symptoms or call 911 for severe breathing symptoms Report symptom of shortness of breath to your cardiologist at visit on 02/19/24.    Plan:  Telephone follow up appointment with care management team member scheduled for:  02/20/23 at 11 am        Patient verbalizes understanding of instructions and care plan provided today and agrees to view in MyChart. Active MyChart status and patient understanding of how to access instructions and care plan via MyChart confirmed with patient.     The patient has been provided with contact information for the care management team and has been advised to call with any health related questions or concerns.   Please call the care guide team at [phone] if you need to cancel or reschedule your appointment.   Please call the Suicide and Crisis Lifeline: 988 call the [place]  National Suicide Prevention Lifeline: 907-659-0526 or TTY: 603-078-4126 TTY 602-320-0240) to talk to a trained counselor call 1-800-273-TALK (toll free, 24 hour hotline) if you are experiencing a Mental Health or Behavioral Health Crisis or need someone to talk to.  Arvin Seip RN, BSN, CCM [employer], Population Health Case Manager Phone: [phone]

## 2024-02-12 NOTE — Transitions of Care (Post Inpatient/ED Visit) (Signed)
 " Transition of Care week 3  Visit Note  02/12/2024  Name: Lacey Brown MRN: [id number]          DOB: 1940-11-21  Situation: Patient enrolled in [facility] 30-day program. Visit completed with patient by telephone.   Background:   Initial Transition Care Management Follow-up Telephone Call Discharge Date and Diagnosis: 01/18/24, upper GI bleed secondary to gastric ulcer status post EGD   Past Medical History:  Diagnosis Date   Arthritis    back, neck; right shoulder;    Asthma    using advair prn   Cancer (HCC) 2024   Left Breast Cancer   Chicken pox    Dyspnea    Dysrhythmia 09/13/2022   Atrial Fibrillation   Hyperlipidemia    Hypertension    controlled with medication;    New onset of headache in cancer patient 11/17/2022   Peripheral vascular disease    mild dilation of aorta    Assessment: Patient Reported Symptoms: Cognitive Cognitive Status: No symptoms reported, Alert and oriented to person, place, and time, Insightful and able to interpret abstract concepts, Normal speech and language skills      Neurological Neurological Review of Symptoms: No symptoms reported    HEENT HEENT Symptoms Reported: No symptoms reported      Cardiovascular Cardiovascular Symptoms Reported: No symptoms reported    Respiratory Respiratory Symptoms Reported: Shortness of breath Additional Respiratory Details: patient reports occasional shortness of breath with exertion.  Denies any additional symptoms.    Endocrine Endocrine Symptoms Reported: No symptoms reported    Gastrointestinal Gastrointestinal Symptoms Reported: No symptoms reported      Genitourinary Genitourinary Symptoms Reported: No symptoms reported    Integumentary Integumentary Symptoms Reported: No symptoms reported    Musculoskeletal Musculoskelatal Symptoms Reviewed: Back pain Additional Musculoskeletal Details: patient reports having chronic back pain. She states her doctor is aware and this has been  ongoing for her. Musculoskeletal Management Strategies: Medication therapy      Psychosocial Psychosocial Symptoms Reported: No symptoms reported         There were no vitals filed for this visit. Pain Scale: 0-10 Pain Score: 0-No pain  Medications Reviewed Today     Reviewed by Quavis Klutz E, RN (Registered Nurse) on 02/12/24 at 1055  Med List Status: <None>   Medication Order Taking? Sig Documenting Provider Last Dose Status Informant  acetaminophen  (TYLENOL ) 500 MG tablet [id number] Yes Take 500 mg by mouth in the morning and at bedtime. [provider]  Active Self  albuterol  (VENTOLIN  HFA) 108 (90 Base) MCG/ACT inhaler [id number] Yes Inhale 2 puffs into the lungs every 6 (six) hours as needed for wheezing or shortness of breath. [name], MD  Active Self  amLODipine  (NORVASC ) 2.5 MG tablet [id number]  Take 1 tablet (2.5 mg total) by mouth daily.  Patient not taking: Reported on 02/04/2024   [name], MD  Active Self  anastrozole  (ARIMIDEX ) 1 MG tablet [id number] Yes TAKE 1 TABLET BY MOUTH EVERY DAY Gudena, Vinay, MD  Active Self  apixaban  (ELIQUIS ) 5 MG TABS tablet [id number] Yes Take 1 tablet (5 mg total) by mouth 2 (two) times daily. Arlice Reichert, MD  Active   Calcium  Carb-Cholecalciferol (CALCIUM  + VITAMIN D3) 500-5 MG-MCG TABS [id number] Yes Take 1 tablet by mouth daily. [provider]  Active Self  denosumab  (PROLIA ) 60 MG/ML SOSY injection [id number] Yes Inject 60 mg into the skin every 6 (six) months. [provider]  Active Self  denosumab  (PROLIA )  injection 60 mg [id number]   [name], MD  Active   fluticasone  (FLONASE ) 50 MCG/ACT nasal spray [id number] Yes Place 2 sprays into both nostrils daily. Tullo, Teresa L, MD  Active Self  metoprolol  succinate (TOPROL -XL) 25 MG 24 hr tablet [id number] Yes TAKE 1 TABLET (25 MG TOTAL) BY MOUTH DAILY. [name], MD  Active Self  pantoprazole  (PROTONIX ) 40 MG tablet [id number] Yes Take 1  tablet (40mg ) twice daily for 4 weeks then once daily thereafter Dahal, Binaya, MD  Active   telmisartan  (MICARDIS ) 80 MG tablet [id number] Yes TAKE 1 TABLET BY MOUTH EVERY DAY Tullo, Teresa L, MD  Active Self  trolamine salicylate (ASPERCREME) 10 % cream [id number] Yes Apply 1 Application topically as needed for muscle pain. [provider]  Active Self  Turmeric (QC TUMERIC COMPLEX) 500 MG CAPS [id number] Yes Take by mouth daily. [provider]  Active   NAPOLEON INHUB 250-50 MCG/ACT AEPB [id number] Yes INHALE 1 PUFF INTO THE LUNGS IN THE MORNING AND AT BEDTIME. [name], MD  Active Self            Goals Addressed             This Visit's Progress    VBCI Transitions of Care (TOC) Care Plan       Problems:  Recent Hospitalization for treatment of GI bleed Knowledge Deficit Related to management of GI bleed.    Goal:  Over the next 30 days, the patient will not experience hospital readmission  Interventions:  Transitions of Care: Advised to avoid NSAIDS, aspirin, alcohol Reviewed medications and discussed compliance.  Reinforced adherence to prescribed proton pump inhibitors.  Reviewed signs of bleeding Assessed for signs of bleeding Encouraged adequate hydration Assessed for ongoing LE swelling Assessed for going constipation symptoms  Advised to call the gastroenterologist when refill is needed for her protonix  Discussed patients reports symptom of occasional SOB.  Advised patient it SOB becomes more frequent or worsens to call primary care provider and/ or for severe breathing issues to call 911.   Patient states she is scheduled to see her cardiologist for a yearly follow up on 02/19/24. Advised patient to report the occasional SOB at cardiology visit.  Assessed for patients BP reading  Patient Self Care Activities:  Attend all scheduled provider appointments Call pharmacy for medication refills 3-7 days in advance of running out of medications Call  provider office for new concerns or questions  Notify RN Care Manager of TOC call rescheduling needs Participate in Transition of Care Program/Attend TOC scheduled calls Take medications as prescribed   Avoid non steroidal anti inflammatory, aspirin, and alcohol Take medications as prescribed Stay hydrated Monitor for signs of bleeding such as black/tarry stools, hematemesis, dizziness, palpitations. Report symptoms to provider as soon as possible and/ or seek emergency medical services for severe symptoms.  Notify provider for worsening shortness of breath symptoms or call 911 for severe breathing symptoms Report symptom of shortness of breath to your cardiologist at visit on 02/19/24.    Plan:  Telephone follow up appointment with care management team member scheduled for:  02/20/23 at 11 am         Recommendation:   Continue Current Plan of Care  Follow Up Plan:   Telephone follow-up in 1 week  Arvin Seip RN, BSN, CCM [facility], Population Health Case Manager Phone: [phone]     "

## 2024-02-13 ENCOUNTER — Encounter: Payer: Self-pay | Admitting: *Deleted

## 2024-02-13 NOTE — Telephone Encounter (Signed)
$  352 due, pt notified via letter

## 2024-02-16 ENCOUNTER — Other Ambulatory Visit: Payer: Self-pay | Admitting: Adult Health

## 2024-02-16 DIAGNOSIS — Z853 Personal history of malignant neoplasm of breast: Secondary | ICD-10-CM

## 2024-02-18 ENCOUNTER — Other Ambulatory Visit (INDEPENDENT_AMBULATORY_CARE_PROVIDER_SITE_OTHER)

## 2024-02-18 DIAGNOSIS — D62 Acute posthemorrhagic anemia: Secondary | ICD-10-CM

## 2024-02-18 DIAGNOSIS — E871 Hypo-osmolality and hyponatremia: Secondary | ICD-10-CM | POA: Diagnosis not present

## 2024-02-19 ENCOUNTER — Ambulatory Visit: Attending: Cardiology | Admitting: Cardiology

## 2024-02-19 VITALS — BP 162/70 | HR 73 | Ht 60.0 in | Wt 153.0 lb

## 2024-02-19 DIAGNOSIS — I77810 Thoracic aortic ectasia: Secondary | ICD-10-CM

## 2024-02-19 DIAGNOSIS — I4819 Other persistent atrial fibrillation: Secondary | ICD-10-CM | POA: Diagnosis not present

## 2024-02-19 DIAGNOSIS — K250 Acute gastric ulcer with hemorrhage: Secondary | ICD-10-CM | POA: Diagnosis not present

## 2024-02-19 DIAGNOSIS — D6869 Other thrombophilia: Secondary | ICD-10-CM

## 2024-02-19 LAB — CBC WITH DIFFERENTIAL/PLATELET
Basophils Absolute: 0.1 K/uL (ref 0.0–0.1)
Basophils Relative: 1.8 % (ref 0.0–3.0)
Eosinophils Absolute: 0.3 K/uL (ref 0.0–0.7)
Eosinophils Relative: 4 % (ref 0.0–5.0)
HCT: 33.3 % — ABNORMAL LOW (ref 36.0–46.0)
Hemoglobin: 11.1 g/dL — ABNORMAL LOW (ref 12.0–15.0)
Lymphocytes Relative: 20 % (ref 12.0–46.0)
Lymphs Abs: 1.5 K/uL (ref 0.7–4.0)
MCHC: 33.3 g/dL (ref 30.0–36.0)
MCV: 89.9 fl (ref 78.0–100.0)
Monocytes Absolute: 0.9 K/uL (ref 0.1–1.0)
Monocytes Relative: 12.2 % — ABNORMAL HIGH (ref 3.0–12.0)
Neutro Abs: 4.8 K/uL (ref 1.4–7.7)
Neutrophils Relative %: 62 % (ref 43.0–77.0)
Platelets: 276 K/uL (ref 150.0–400.0)
RBC: 3.7 Mil/uL — ABNORMAL LOW (ref 3.87–5.11)
RDW: 14.2 % (ref 11.5–15.5)
WBC: 7.8 K/uL (ref 4.0–10.5)

## 2024-02-19 LAB — BASIC METABOLIC PANEL WITH GFR
BUN: 19 mg/dL (ref 6–23)
CO2: 30 meq/L (ref 19–32)
Calcium: 9.1 mg/dL (ref 8.4–10.5)
Chloride: 97 meq/L (ref 96–112)
Creatinine, Ser: 0.64 mg/dL (ref 0.40–1.20)
GFR: 80.87 mL/min
Glucose, Bld: 94 mg/dL (ref 70–99)
Potassium: 5.2 meq/L — ABNORMAL HIGH (ref 3.5–5.1)
Sodium: 130 meq/L — ABNORMAL LOW (ref 135–145)

## 2024-02-19 NOTE — Progress Notes (Signed)
 " Cardiology Office Note:  .   Date:  02/19/2024  ID:  Lacey Brown, DOB July 06, 1940, MRN [id number] PCP: [name], MD   [facility] Providers Cardiologist:  Oneil Parchment, MD     History of Present Illness: .   Lacey Brown is a 84 y.o. female Discussed the use of AI scribe History of Present Illness Lacey Brown is an 84 year old female with paroxysmal atrial fibrillation, hypertension, and hyperlipidemia who presents for follow-up.  She has a history of paroxysmal atrial fibrillation, first identified in 2024 following perioperative testing. She has been on Eliquis  since then and has tolerated it well. In December, she experienced a significant gastrointestinal bleed, necessitating a transfusion of two pints of blood. An endoscopy revealed a bleeding ulcer, leading to a temporary discontinuation of Eliquis  for about four days before resuming it. No further bleeding issues have occurred since then.  Her hypertension is managed with telmisartan  80 mg and metoprolol  25 mg. She was previously on amlodipine , which was discontinued. She notes that her blood pressure tends to be elevated during doctor visits.  She is on medication for hyperlipidemia, with an LDL level of 46 mg/dL. Her ascending aorta measurements have increased slightly from 41 to 43 millimeters.  She experiences chronic dyspnea on exertion.  She takes Tylenol  regularly, both 500 mg and 650 mg.      Studies Reviewed: .        Results Labs LDL: 46 Hemoglobin (01/2024): Decreased from 13 to 6 Renal function: Within normal limits  Diagnostic Ascending aorta measurement: Increased from 41 mm to 43 mm Endoscopy (01/2024): Bleeding ulcer Risk Assessment/Calculations:           Physical Exam:   VS:  BP (!) 162/70 (BP Location: Right Arm, Patient Position: Sitting, Cuff Size: Normal)   Pulse 73   Ht 5' (1.524 m)   Wt 153 lb (69.4 kg)   SpO2 97%   BMI 29.88 kg/m    Wt Readings  from Last 3 Encounters:  02/19/24 153 lb (69.4 kg)  02/04/24 153 lb 3.2 oz (69.5 kg)  01/18/24 148 lb 13 oz (67.5 kg)    GEN: Well nourished, well developed in no acute distress NECK: No JVD; No carotid bruits CARDIAC: RRR, no murmurs, no rubs, no gallops RESPIRATORY:  Clear to auscultation without rales, wheezing or rhonchi  ABDOMEN: Soft, non-tender, non-distended EXTREMITIES:  No edema; No deformity   ASSESSMENT AND PLAN: .    Assessment and Plan Assessment & Plan Paroxysmal atrial fibrillation Managed with Eliquis . Recent GI bleed in December led to temporary discontinuation of Eliquis . Discussed Watchman device as an alternative to reduce bleeding risk, but she is hesitant due to age and recent bleeding event. Procedure involves placing a device via catheterization to prevent clot formation, potentially allowing discontinuation of Eliquis . Risks include procedural complications, but benefits include reduced bleeding risk. Shared decision-making emphasized, with referral to specialists for further evaluation if desired. - Continue Eliquis . - Will consider referral to specialists for Watchman device evaluation if she decides to pursue this option.  Hypertension Managed with telmisartan  and metoprolol . Blood pressure slightly elevated today, possibly due to white coat syndrome. Amlodipine  was discontinued by another provider due to low dose and stable blood pressure readings. - Continue telmisartan  80 mg. - Continue metoprolol  25 mg. - Monitor blood pressure.  Hyperlipidemia Managed with rosuvastatin . LDL levels are well-controlled at 46 mg/dL. - Continue rosuvastatin  10 mg.  Chronic dyspnea on exertion - Continue  current management.         Dispo: 6 months APP  Signed, Oneil Parchment, MD  "

## 2024-02-19 NOTE — Patient Instructions (Signed)
 Medication Instructions:  The current medical regimen is effective;  continue present plan and medications.  *If you need a refill on your cardiac medications before your next appointment, please call your pharmacy*  Follow-Up: At [facility], you and your health needs are our priority.  As part of our continuing mission to provide you with exceptional heart care, our providers are all part of one team.  This team includes your primary Cardiologist (physician) and Advanced Practice Providers or APPs (Physician Assistants and Nurse Practitioners) who all work together to provide you with the care you need, when you need it.  Your next appointment:   6 month(s)  Provider:   One of our Advanced Practice Providers (APPs): Morse Clause, PA-C  Lamarr Satterfield, NP Miriam Shams, NP  Olivia Pavy, PA-C Josefa Beauvais, NP  Leontine Salen, PA-C Orren Fabry, PA-C  [place], PA-C Ernest Dick, NP  Damien Braver, NP Jon Hails, PA-C  Waddell Donath, PA-C    Dayna Dunn, PA-C  Scott Weaver, PA-C Lum Louis, NP Katlyn West, NP Callie Goodrich, PA-C  Xika Zhao, NP Sheng Haley, PA-C    Kathleen Johnson, PA-C   Then, Oneil Parchment, MD will plan to see you again in 1 year(s).    We recommend signing up for the patient portal called MyChart.  Sign up information is provided on this After Visit Summary.  MyChart is used to connect with patients for Virtual Visits (Telemedicine).  Patients are able to view lab/test results, encounter notes, upcoming appointments, etc.  Non-urgent messages can be sent to your provider as well.   To learn more about what you can do with MyChart, go to [email or web address].

## 2024-02-20 ENCOUNTER — Other Ambulatory Visit: Payer: Self-pay | Admitting: Internal Medicine

## 2024-02-20 ENCOUNTER — Ambulatory Visit: Payer: Self-pay | Admitting: Internal Medicine

## 2024-02-20 DIAGNOSIS — E875 Hyperkalemia: Secondary | ICD-10-CM

## 2024-02-23 ENCOUNTER — Ambulatory Visit (INDEPENDENT_AMBULATORY_CARE_PROVIDER_SITE_OTHER)

## 2024-02-23 ENCOUNTER — Telehealth: Payer: Self-pay

## 2024-02-23 DIAGNOSIS — M8080XG Other osteoporosis with current pathological fracture, unspecified site, subsequent encounter for fracture with delayed healing: Secondary | ICD-10-CM

## 2024-02-23 DIAGNOSIS — M170 Bilateral primary osteoarthritis of knee: Secondary | ICD-10-CM

## 2024-02-23 NOTE — Progress Notes (Signed)
 Pt received Prolia  injection in right deltoid muscle. Pt tolerated it well with no complaints or concerns.

## 2024-02-25 NOTE — Patient Instructions (Signed)
 Visit Information  Thank you for taking time to visit with me today. Please don't hesitate to contact me if I can be of assistance to you before our next scheduled telephone appointment.  Our next appointment is by telephone on 03/02/24 at 11am  Following is a copy of your care plan:   Goals Addressed             This Visit's Progress    VBCI Transitions of Care (TOC) Care Plan       Problems:  Recent Hospitalization for treatment of GI bleed Knowledge Deficit Related to management of GI bleed.    Goal:  Over the next 30 days, the patient will not experience hospital readmission  Interventions:  Transitions of Care: Advised to avoid NSAIDS, aspirin, alcohol Reviewed medications and discussed compliance.  Reinforced adherence to prescribed proton pump inhibitors.  Reviewed signs of bleeding Assessed for signs of bleeding Encouraged adequate hydration Assessed for ongoing LE swelling Assessed for going constipation symptoms   Patient Self Care Activities:  Call pharmacy for medication refills 3-7 days in advance of running out of medications Call provider office for new concerns or questions  Notify RN Care Manager of TOC call rescheduling needs Participate in Transition of Care Program/Attend TOC scheduled calls Take medications as prescribed   Avoid non steroidal anti inflammatory, aspirin, and alcohol Take medications as prescribed Stay hydrated Monitor for signs of bleeding such as black/tarry stools, hematemesis, dizziness, palpitations. Report symptoms to provider as soon as possible and/ or seek emergency medical services for severe symptoms.  Notify provider for worsening shortness of breath symptoms or call 911 for severe breathing symptoms Report symptom of shortness of breath to your cardiologist at visit on 02/19/24.    Plan:  Telephone follow up appointment with care management team member scheduled for:  03/03/23 at 11 am        Patient verbalizes  understanding of instructions and care plan provided today and agrees to view in MyChart. Active MyChart status and patient understanding of how to access instructions and care plan via MyChart confirmed with patient.     The patient has been provided with contact information for the care management team and has been advised to call with any health related questions or concerns.   Please call the care guide team at [phone] if you need to cancel or reschedule your appointment.   Please call the Suicide and Crisis Lifeline: 988 call the [place]  National Suicide Prevention Lifeline: 365-399-4994 or TTY: (778) 059-0912 TTY (409)511-3106) to talk to a trained counselor call 1-800-273-TALK (toll free, 24 hour hotline) if you are experiencing a Mental Health or Behavioral Health Crisis or need someone to talk to.  Arvin Seip RN, BSN, CCM [employer], Population Health Case Manager Phone: [phone]

## 2024-02-25 NOTE — Transitions of Care (Post Inpatient/ED Visit) (Signed)
 " Transition of Care week 4  Visit Note  02/23/2024 - late entry  Name: Lacey Brown MRN: [id number]          DOB: Oct 07, 1940  Situation: Patient enrolled in [facility] 30-day program. Visit completed with patient by telephone.   Background:   Initial Transition Care Management Follow-up Telephone Call Discharge Date and Diagnosis: No data recorded   Past Medical History:  Diagnosis Date   Arthritis    back, neck; right shoulder;    Asthma    using advair prn   Cancer (HCC) 2024   Left Breast Cancer   Chicken pox    Dyspnea    Dysrhythmia 09/13/2022   Atrial Fibrillation   Hyperlipidemia    Hypertension    controlled with medication;    New onset of headache in cancer patient 11/17/2022   Peripheral vascular disease    mild dilation of aorta    Assessment: Patient Reported Symptoms: Cognitive Cognitive Status: No symptoms reported, Alert and oriented to person, place, and time, Insightful and able to interpret abstract concepts, Normal speech and language skills      Neurological Neurological Review of Symptoms: No symptoms reported    HEENT HEENT Symptoms Reported: No symptoms reported      Cardiovascular Cardiovascular Symptoms Reported: No symptoms reported    Respiratory Respiratory Symptoms Reported: No symptoms reported    Endocrine Endocrine Symptoms Reported: No symptoms reported    Gastrointestinal Gastrointestinal Symptoms Reported: No symptoms reported      Genitourinary Genitourinary Symptoms Reported: No symptoms reported    Integumentary Integumentary Symptoms Reported: No symptoms reported    Musculoskeletal Musculoskelatal Symptoms Reviewed: Back pain Additional Musculoskeletal Details: patient reports having occasional arthritis pain in her back and left knee. Musculoskeletal Management Strategies: Medication therapy, Routine screening      Psychosocial Psychosocial Symptoms Reported: No symptoms reported         There were no vitals  filed for this visit. Pain Scale: 0-10 Pain Score: 0-No pain  Medications Reviewed Today     Reviewed by Vaness Jelinski E, RN (Registered Nurse) on 02/23/24 at 1017  Med List Status: <None>   Medication Order Taking? Sig Documenting Provider Last Dose Status Informant  acetaminophen  (TYLENOL ) 500 MG tablet [id number] Yes Take 500 mg by mouth in the morning and at bedtime. [provider]  Active Self  albuterol  (VENTOLIN  HFA) 108 (90 Base) MCG/ACT inhaler [id number] Yes Inhale 2 puffs into the lungs every 6 (six) hours as needed for wheezing or shortness of breath. [name], MD  Active Self  amLODipine  (NORVASC ) 2.5 MG tablet [id number]  Take 1 tablet (2.5 mg total) by mouth daily. [name], MD  Active Self  anastrozole  (ARIMIDEX ) 1 MG tablet [id number] Yes TAKE 1 TABLET BY MOUTH EVERY DAY Gudena, Vinay, MD  Active Self  apixaban  (ELIQUIS ) 5 MG TABS tablet [id number] Yes Take 1 tablet (5 mg total) by mouth 2 (two) times daily. Arlice Reichert, MD  Active   Calcium  Carb-Cholecalciferol (CALCIUM  + VITAMIN D3) 500-5 MG-MCG TABS [id number] Yes Take 1 tablet by mouth daily. [provider]  Active Self  denosumab  (PROLIA ) 60 MG/ML SOSY injection [id number] Yes Inject 60 mg into the skin every 6 (six) months. [provider]  Active Self  denosumab  (PROLIA ) injection 60 mg [id number]   [name], MD  Active   fluticasone  (FLONASE ) 50 MCG/ACT nasal spray [id number] Yes Place 2 sprays into both nostrils daily. [name], MD  Active  Self  metoprolol  succinate (TOPROL -XL) 25 MG 24 hr tablet [id number] Yes TAKE 1 TABLET (25 MG TOTAL) BY MOUTH DAILY. [name], MD  Active Self  pantoprazole  (PROTONIX ) 40 MG tablet [id number] Yes Take 1 tablet (40mg ) twice daily for 4 weeks then once daily thereafter Dahal, Binaya, MD  Active   psyllium (REGULOID) 0.52 g capsule [id number] Yes Take 0.52 g by mouth daily. [provider]  Active   telmisartan  (MICARDIS ) 80  MG tablet [id number] Yes TAKE 1 TABLET BY MOUTH EVERY DAY Tullo, Teresa L, MD  Active Self  trolamine salicylate (ASPERCREME) 10 % cream [id number] Yes Apply 1 Application topically as needed for muscle pain. [provider]  Active Self  Turmeric (QC TUMERIC COMPLEX) 500 MG CAPS [id number] Yes Take by mouth daily. [provider]  Active   NAPOLEON INHUB 250-50 MCG/ACT AEPB [id number] Yes INHALE 1 PUFF INTO THE LUNGS IN THE MORNING AND AT BEDTIME. [name], MD  Active Self            Goals Addressed             This Visit's Progress    VBCI Transitions of Care (TOC) Care Plan       Problems:  Recent Hospitalization for treatment of GI bleed Knowledge Deficit Related to management of GI bleed.    Goal:  Over the next 30 days, the patient will not experience hospital readmission  Interventions:  Transitions of Care: Advised to avoid NSAIDS, aspirin, alcohol Reviewed medications and discussed compliance.  Reinforced adherence to prescribed proton pump inhibitors.  Reviewed signs of bleeding Assessed for signs of bleeding Encouraged adequate hydration Assessed for ongoing LE swelling Assessed for going constipation symptoms   Patient Self Care Activities:  Call pharmacy for medication refills 3-7 days in advance of running out of medications Call provider office for new concerns or questions  Notify RN Care Manager of TOC call rescheduling needs Participate in Transition of Care Program/Attend TOC scheduled calls Take medications as prescribed   Avoid non steroidal anti inflammatory, aspirin, and alcohol Take medications as prescribed Stay hydrated Monitor for signs of bleeding such as black/tarry stools, hematemesis, dizziness, palpitations. Report symptoms to provider as soon as possible and/ or seek emergency medical services for severe symptoms.  Notify provider for worsening shortness of breath symptoms or call 911 for severe breathing  symptoms Report symptom of shortness of breath to your cardiologist at visit on 02/19/24.    Plan:  Telephone follow up appointment with care management team member scheduled for:  03/03/23 at 11 am        Recommendation:   Continue Current Plan of Care  Follow Up Plan:   Telephone follow-up in 1 day  Arvin Seip RN, BSN, CCM [facility], Population Health Case Manager Phone: [phone]     "

## 2024-02-27 ENCOUNTER — Ambulatory Visit: Payer: Self-pay

## 2024-02-27 NOTE — Telephone Encounter (Signed)
 FYI Only or Action Required?: FYI only for provider: Home care advised.  Patient was last seen in primary care on 02/04/2024 by [name], MD.  Called Nurse Triage reporting Mouth Lesions.  Symptoms began several days ago.  Interventions attempted: Nothing.  Symptoms are: unchanged.  Triage Disposition: Home Care  Patient/caregiver understands and will follow disposition?: Yes  Reason for Disposition  Canker sore(s) suspected (e.g., 1 to 3 painful white shallow ulcers; no fever, no recent cancer treatment)  Answer Assessment - Initial Assessment Questions Patient states that she has a small white sore with redness around it inside her mouth on the Right. She noted it a couple of days ago and only notes pain when eating certain foods. Home care advised.   1. LOCATION: Where is the mouth sore (ulcer) located?      Right inner cheek   2. NUMBER: How many sores are there? :     1  3. SIZE: How large is the sore?  (e.g., size of an apple seed, watermelon seed, pencil eraser)     Small (pencil eraser)  4. PAIN: Are they painful? If Yes, ask: How bad is it?  (Scale 0-10; or none, mild, moderate, severe)     Yes, when eating certain foods  5. ONSET: When did you first notice the sore?      A few days ago  6. RECURRENT SYMPTOM: Have you had a mouth ulcer before? If Yes, ask: When was the last time? and What happened that time?      No  7. CAUSE: What do you think is causing the mouth sore?     Not sure  8. OTHER SYMPTOMS: Do you have any other symptoms? (e.g., fever, swollen lymph node)     Denies any other symptoms  9. PREGNANCY: Is there any chance you are pregnant? When was your last menstrual period?     NA  Protocols used: Mouth Ulcers-A-AH

## 2024-02-27 NOTE — Telephone Encounter (Signed)
 1st CB attempt. LVM to call office back.    Copied from CRM #[id number]. Topic: Clinical - Medical Advice >> Feb 27, 2024 10:31 AM Delon DASEN wrote: Reason for CRM: Patient has a white sore spot in her mouth and is concerned - [phone]

## 2024-02-27 NOTE — Telephone Encounter (Signed)
 Duplicate encounter. Pt already triaged.   Message from Deaijah H sent at 02/27/2024 10:40 AM EST  Reason for Triage: call back to NT

## 2024-02-27 NOTE — Telephone Encounter (Signed)
 Noted pt scheduled to see Dr. Onesimo on the 28th

## 2024-02-27 NOTE — Telephone Encounter (Signed)
 First attempt to contact patient for triage. LVM for patient to return call to [phone]   Message from Deaijah H sent at 02/27/2024 10:40 AM EST  Reason for Triage: call back to NT

## 2024-03-02 ENCOUNTER — Telehealth: Payer: Self-pay

## 2024-03-02 NOTE — Addendum Note (Signed)
 Addended by: Abdulloh Ullom, DEBBIE C on: 03/02/2024 11:16 AM   Modules accepted: Orders

## 2024-03-02 NOTE — Patient Instructions (Signed)
 Visit Information  Thank you for taking time to visit with me today. You have completed the 30 day TOC program and met your program goals.   Please contact your primary care provider if you have any further needs or concerns.    Following is a copy of your care plan:   Goals Addressed             This Visit's Progress    COMPLETED: VBCI Transitions of Care (TOC) Care Plan       Problems:  Recent Hospitalization for treatment of GI bleed Knowledge Deficit Related to management of GI bleed.    Goal:  Over the next 30 days, the patient will not experience hospital readmission  Interventions:  Transitions of Care: Advised to avoid NSAIDS, aspirin, alcohol Reviewed medications and discussed compliance.   Reviewed signs of bleeding Assessed for signs of bleeding Encouraged adequate hydration Assessed for ongoing LE swelling- reports LE swelling has resolved Assessed for going constipation symptoms - reports constipation has resolved Discussed and reviewed cardiology visit from 02/19/24.   Advised to monitor blood pressure as per cardiologist recommendation.  Advised to record blood pressure readings and share results with cardiologist and/ or primary care provider.  Discussed and offered referral to CCM longitudinal case manager.  Patient declined.   Patient Self Care Activities:  Call pharmacy for medication refills 3-7 days in advance of running out of medications Call provider office for new concerns or questions  Take medications as prescribed   Avoid non steroidal anti inflammatory, aspirin, and alcohol Stay hydrated Monitor for signs of bleeding such as black/tarry stools, hematemesis, dizziness, palpitations. Report symptoms to provider as soon as possible and/ or seek emergency medical services for severe symptoms.  Notify provider for worsening shortness of breath symptoms or call 911 for severe breathing symptoms   Plan:  No further follow up required: patient has  completed the 30 day TOC program and goals have been met.         Patient verbalizes understanding of instructions and care plan provided today and agrees to view in MyChart. Active MyChart status and patient understanding of how to access instructions and care plan via MyChart confirmed with patient.     The patient has been provided with contact information for the care management team and has been advised to call with any health related questions or concerns.   Please call the care guide team at [phone] if you need to cancel or reschedule your appointment.   Please call the Suicide and Crisis Lifeline: 988 call the [place]  National Suicide Prevention Lifeline: 332-341-7170 or TTY: (640) 380-5229 TTY (989) 087-2843) to talk to a trained counselor call 1-800-273-TALK (toll free, 24 hour hotline) if you are experiencing a Mental Health or Behavioral Health Crisis or need someone to talk to.  Arvin Seip RN, BSN, CCM [employer], Population Health Case Manager Phone: [phone]

## 2024-03-02 NOTE — Transitions of Care (Post Inpatient/ED Visit) (Signed)
 " Transition of Care week #5  Visit Note  03/02/2024  Name: Lacey Brown MRN: [id number]          DOB: 06-13-1940  Situation: Patient enrolled in [facility] 30-day program. Visit completed with patient by telephone.   Background:   Initial Transition Care Management Follow-up Telephone Call Discharge Date and Diagnosis: 01/18/24, upper GI bleed secondary to gastric ulcer status post EGD   Past Medical History:  Diagnosis Date   Arthritis    back, neck; right shoulder;    Asthma    using advair prn   Cancer (HCC) 2024   Left Breast Cancer   Chicken pox    Dyspnea    Dysrhythmia 09/13/2022   Atrial Fibrillation   Hyperlipidemia    Hypertension    controlled with medication;    New onset of headache in cancer patient 11/17/2022   Peripheral vascular disease    mild dilation of aorta    Assessment: Patient Reported Symptoms: Cognitive Cognitive Status: No symptoms reported, Alert and oriented to person, place, and time, Insightful and able to interpret abstract concepts, Normal speech and language skills      Neurological Neurological Review of Symptoms: No symptoms reported    HEENT HEENT Symptoms Reported: No symptoms reported      Cardiovascular Cardiovascular Symptoms Reported: No symptoms reported    Respiratory Respiratory Symptoms Reported: No symptoms reported    Endocrine Endocrine Symptoms Reported: No symptoms reported    Gastrointestinal Gastrointestinal Symptoms Reported: No symptoms reported      Genitourinary Genitourinary Symptoms Reported: No symptoms reported    Integumentary Integumentary Symptoms Reported: No symptoms reported    Musculoskeletal Musculoskelatal Symptoms Reviewed: Back pain Additional Musculoskeletal Details: patient reports no changes in back pain/ left knee. She states he back pain/ left knee is chronic Musculoskeletal Management Strategies: Medication therapy, Routine screening      Psychosocial Psychosocial Symptoms  Reported: No symptoms reported         There were no vitals filed for this visit. Pain Scale: 0-10 Pain Score: 0-No pain  Medications Reviewed Today     Reviewed by Severo Beber E, RN (Registered Nurse) on 03/02/24 at 1203  Med List Status: <None>   Medication Order Taking? Sig Documenting Provider Last Dose Status Informant  acetaminophen  (TYLENOL ) 500 MG tablet [id number] Yes Take 500 mg by mouth in the morning and at bedtime. [provider]  Active Self  albuterol  (VENTOLIN  HFA) 108 (90 Base) MCG/ACT inhaler [id number] Yes Inhale 2 puffs into the lungs every 6 (six) hours as needed for wheezing or shortness of breath. [name], MD  Active Self  amLODipine  (NORVASC ) 2.5 MG tablet [id number]  Take 1 tablet (2.5 mg total) by mouth daily. [name], MD  Active Self  anastrozole  (ARIMIDEX ) 1 MG tablet [id number] Yes TAKE 1 TABLET BY MOUTH EVERY DAY Gudena, Vinay, MD  Active Self  apixaban  (ELIQUIS ) 5 MG TABS tablet [id number] Yes Take 1 tablet (5 mg total) by mouth 2 (two) times daily. Arlice Reichert, MD  Active   Calcium  Carb-Cholecalciferol (CALCIUM  + VITAMIN D3) 500-5 MG-MCG TABS [id number] Yes Take 1 tablet by mouth daily. [provider]  Active Self  denosumab  (PROLIA ) 60 MG/ML SOSY injection [id number] Yes Inject 60 mg into the skin every 6 (six) months. [provider]  Active Self  fluticasone  (FLONASE ) 50 MCG/ACT nasal spray [id number] Yes Place 2 sprays into both nostrils daily. Tullo, Teresa L, MD  Active Self  metoprolol  succinate (TOPROL -XL)  25 MG 24 hr tablet [id number] Yes TAKE 1 TABLET (25 MG TOTAL) BY MOUTH DAILY. [name], MD  Active Self  pantoprazole  (PROTONIX ) 40 MG tablet [id number] Yes Take 1 tablet (40mg ) twice daily for 4 weeks then once daily thereafter Dahal, Binaya, MD  Active   psyllium (REGULOID) 0.52 g capsule [id number] Yes Take 0.52 g by mouth daily. [provider]  Active   telmisartan  (MICARDIS ) 80 MG tablet  [id number] Yes TAKE 1 TABLET BY MOUTH EVERY DAY Tullo, Teresa L, MD  Active Self  trolamine salicylate (ASPERCREME) 10 % cream [id number] Yes Apply 1 Application topically as needed for muscle pain. [provider]  Active Self  Turmeric (QC TUMERIC COMPLEX) 500 MG CAPS [id number] Yes Take by mouth daily. [provider]  Active   NAPOLEON INHUB 250-50 MCG/ACT AEPB [id number] Yes INHALE 1 PUFF INTO THE LUNGS IN THE MORNING AND AT BEDTIME. [name], MD  Active Self            Goals Addressed             This Visit's Progress    COMPLETED: [facility] Transitions of Care (TOC) Care Plan       Problems:  Recent Hospitalization for treatment of GI bleed Knowledge Deficit Related to management of GI bleed.    Goal:  Over the next 30 days, the patient will not experience hospital readmission  Interventions:  Transitions of Care: Advised to avoid NSAIDS, aspirin, alcohol Reviewed medications and discussed compliance.   Reviewed signs of bleeding Assessed for signs of bleeding Encouraged adequate hydration Assessed for ongoing LE swelling- reports LE swelling has resolved Assessed for going constipation symptoms - reports constipation has resolved Discussed and reviewed cardiology visit from 02/19/24.   Advised to monitor blood pressure as per cardiologist recommendation.  Advised to record blood pressure readings and share results with cardiologist and/ or primary care provider.  Discussed and offered referral to CCM longitudinal case manager.  Patient declined.   Patient Self Care Activities:  Call pharmacy for medication refills 3-7 days in advance of running out of medications Call provider office for new concerns or questions  Take medications as prescribed   Avoid non steroidal anti inflammatory, aspirin, and alcohol Stay hydrated Monitor for signs of bleeding such as black/tarry stools, hematemesis, dizziness, palpitations. Report symptoms to provider as soon as  possible and/ or seek emergency medical services for severe symptoms.  Notify provider for worsening shortness of breath symptoms or call 911 for severe breathing symptoms   Plan:  No further follow up required: patient has completed the 30 day TOC program and goals have been met.          Recommendation:   Continue Current Plan of Care  Follow Up Plan:   Closing From:  Transitions of Care Program.  Patient has completed the 30 day TOC program and goals have been met.   Arvin Seip RN, BSN, CCM [employer], Population Health Case Manager Phone: [phone]     "

## 2024-03-03 ENCOUNTER — Ambulatory Visit: Admitting: Internal Medicine

## 2024-03-05 ENCOUNTER — Other Ambulatory Visit: Payer: Self-pay | Admitting: Internal Medicine

## 2024-03-05 ENCOUNTER — Telehealth: Payer: Self-pay | Admitting: Internal Medicine

## 2024-03-05 ENCOUNTER — Other Ambulatory Visit (INDEPENDENT_AMBULATORY_CARE_PROVIDER_SITE_OTHER)

## 2024-03-05 ENCOUNTER — Other Ambulatory Visit (HOSPITAL_COMMUNITY): Payer: Self-pay

## 2024-03-05 ENCOUNTER — Other Ambulatory Visit: Payer: Self-pay

## 2024-03-05 DIAGNOSIS — E875 Hyperkalemia: Secondary | ICD-10-CM

## 2024-03-05 MED ORDER — PANTOPRAZOLE SODIUM 40 MG PO TBEC: 40.0000 mg | DELAYED_RELEASE_TABLET | Freq: Every day | ORAL | 1 refills | Status: AC

## 2024-03-05 NOTE — Telephone Encounter (Signed)
 Prescription Request  03/05/2024  LOV: 02/04/2024  What is the name of the medication or equipment? pantoprazole  (PROTONIX ) 40 MG tablet   Have you contacted your pharmacy to request a refill? Yes   Which pharmacy would you like this sent to?  [id number] [place], [facility] - [address] [place] [address] Phone: [phone] Fax: [phone]  [id number] [place], [place] - [address] [place] [address] Phone: [phone] Fax: [phone]  [facility] [address] [place] [address] Phone: [phone] Fax: [phone]    Patient notified that their request is being sent to the clinical staff for review and that they should receive a response within 2 business days.   Please advise at [facility] [phone]

## 2024-03-05 NOTE — Telephone Encounter (Signed)
 LMTCB. Please let pt know that her Pantoprazole  has been refilled.

## 2024-03-05 NOTE — Telephone Encounter (Signed)
 Refilled: 01/18/2024  by historical provider Last OV: 02/04/2024 Next OV: 06/22/2024  Is it okay to refill?

## 2024-03-06 LAB — BASIC METABOLIC PANEL WITH GFR
BUN: 24 mg/dL (ref 7–25)
CO2: 25 mmol/L (ref 20–32)
Calcium: 9.3 mg/dL (ref 8.6–10.4)
Chloride: 95 mmol/L — ABNORMAL LOW (ref 98–110)
Creat: 0.72 mg/dL (ref 0.60–0.95)
Glucose, Bld: 89 mg/dL (ref 65–99)
Potassium: 4.8 mmol/L (ref 3.5–5.3)
Sodium: 129 mmol/L — ABNORMAL LOW (ref 135–146)
eGFR: 82 mL/min/{1.73_m2}

## 2024-03-08 ENCOUNTER — Ambulatory Visit: Payer: Self-pay | Admitting: Internal Medicine

## 2024-03-08 ENCOUNTER — Other Ambulatory Visit: Payer: Self-pay | Admitting: Internal Medicine

## 2024-03-08 ENCOUNTER — Encounter: Payer: Self-pay | Admitting: Internal Medicine

## 2024-03-08 DIAGNOSIS — K2900 Acute gastritis without bleeding: Secondary | ICD-10-CM

## 2024-03-08 DIAGNOSIS — D62 Acute posthemorrhagic anemia: Secondary | ICD-10-CM

## 2024-03-11 ENCOUNTER — Other Ambulatory Visit: Payer: Self-pay | Admitting: Internal Medicine

## 2024-03-11 DIAGNOSIS — K254 Chronic or unspecified gastric ulcer with hemorrhage: Secondary | ICD-10-CM

## 2024-03-11 DIAGNOSIS — D62 Acute posthemorrhagic anemia: Secondary | ICD-10-CM

## 2024-03-11 NOTE — Telephone Encounter (Signed)
 Pt is aware.

## 2024-03-24 ENCOUNTER — Ambulatory Visit: Admitting: Orthopaedic Surgery

## 2024-03-31 ENCOUNTER — Ambulatory Visit: Admitting: Orthopaedic Surgery

## 2024-04-01 ENCOUNTER — Ambulatory Visit: Admitting: Gastroenterology

## 2024-04-05 ENCOUNTER — Other Ambulatory Visit

## 2024-04-08 ENCOUNTER — Ambulatory Visit: Admitting: Gastroenterology

## 2024-06-22 ENCOUNTER — Ambulatory Visit: Admitting: Internal Medicine

## 2024-08-11 ENCOUNTER — Encounter

## 2024-08-23 ENCOUNTER — Ambulatory Visit

## 2024-12-22 ENCOUNTER — Ambulatory Visit
